# Patient Record
Sex: Male | Born: 1945
Health system: Southern US, Community
[De-identification: ages and names within clinical notes are randomized; demographics above are authoritative.]

## PROBLEM LIST (undated history)

## (undated) DIAGNOSIS — I10 Essential (primary) hypertension: Secondary | ICD-10-CM

## (undated) DIAGNOSIS — R7303 Prediabetes: Secondary | ICD-10-CM

## (undated) DIAGNOSIS — E785 Hyperlipidemia, unspecified: Secondary | ICD-10-CM

## (undated) DIAGNOSIS — M359 Systemic involvement of connective tissue, unspecified: Secondary | ICD-10-CM

## (undated) DIAGNOSIS — E119 Type 2 diabetes mellitus without complications: Secondary | ICD-10-CM

## (undated) DIAGNOSIS — I209 Angina pectoris, unspecified: Secondary | ICD-10-CM

## (undated) DIAGNOSIS — I219 Acute myocardial infarction, unspecified: Secondary | ICD-10-CM

## (undated) DIAGNOSIS — N189 Chronic kidney disease, unspecified: Secondary | ICD-10-CM

## (undated) HISTORY — PX: ANGIOPLASTY: SHX39

## (undated) HISTORY — DX: Prediabetes: R73.03

## (undated) HISTORY — PX: CARDIAC CATHETERIZATION: SHX172

---

## 2000-01-28 ENCOUNTER — Encounter: Payer: Self-pay | Admitting: *Deleted

## 2000-01-28 ENCOUNTER — Inpatient Hospital Stay (HOSPITAL_COMMUNITY): Admission: EM | Admit: 2000-01-28 | Discharge: 2000-02-01 | Payer: Self-pay | Admitting: *Deleted

## 2000-01-29 ENCOUNTER — Encounter: Payer: Self-pay | Admitting: Internal Medicine

## 2001-06-17 ENCOUNTER — Ambulatory Visit (HOSPITAL_COMMUNITY): Admission: RE | Admit: 2001-06-17 | Discharge: 2001-06-17 | Payer: Self-pay | Admitting: Family Medicine

## 2001-06-17 ENCOUNTER — Encounter: Payer: Self-pay | Admitting: Family Medicine

## 2004-03-09 ENCOUNTER — Emergency Department (HOSPITAL_COMMUNITY): Admission: EM | Admit: 2004-03-09 | Discharge: 2004-03-10 | Payer: Self-pay | Admitting: Emergency Medicine

## 2004-06-02 ENCOUNTER — Emergency Department (HOSPITAL_COMMUNITY): Admission: EM | Admit: 2004-06-02 | Discharge: 2004-06-02 | Payer: Self-pay | Admitting: Emergency Medicine

## 2004-08-14 ENCOUNTER — Ambulatory Visit: Payer: Self-pay | Admitting: Family Medicine

## 2004-08-21 ENCOUNTER — Ambulatory Visit: Payer: Self-pay | Admitting: Family Medicine

## 2004-09-24 ENCOUNTER — Ambulatory Visit: Payer: Self-pay | Admitting: *Deleted

## 2004-12-24 ENCOUNTER — Ambulatory Visit: Payer: Self-pay | Admitting: Family Medicine

## 2005-01-21 ENCOUNTER — Ambulatory Visit: Payer: Self-pay | Admitting: Family Medicine

## 2006-02-26 ENCOUNTER — Ambulatory Visit: Payer: Self-pay | Admitting: Family Medicine

## 2007-09-10 DIAGNOSIS — I219 Acute myocardial infarction, unspecified: Secondary | ICD-10-CM

## 2007-09-10 HISTORY — DX: Acute myocardial infarction, unspecified: I21.9

## 2007-11-14 ENCOUNTER — Inpatient Hospital Stay (HOSPITAL_COMMUNITY): Admission: EM | Admit: 2007-11-14 | Discharge: 2007-11-18 | Payer: Self-pay | Admitting: Emergency Medicine

## 2007-11-16 ENCOUNTER — Encounter (INDEPENDENT_AMBULATORY_CARE_PROVIDER_SITE_OTHER): Payer: Self-pay | Admitting: Cardiovascular Disease

## 2008-02-07 ENCOUNTER — Inpatient Hospital Stay (HOSPITAL_COMMUNITY): Admission: EM | Admit: 2008-02-07 | Discharge: 2008-02-10 | Payer: Self-pay | Admitting: Emergency Medicine

## 2008-02-09 ENCOUNTER — Encounter (INDEPENDENT_AMBULATORY_CARE_PROVIDER_SITE_OTHER): Payer: Self-pay | Admitting: Cardiology

## 2008-06-16 ENCOUNTER — Emergency Department (HOSPITAL_COMMUNITY): Admission: EM | Admit: 2008-06-16 | Discharge: 2008-06-16 | Payer: Self-pay | Admitting: Emergency Medicine

## 2008-09-07 ENCOUNTER — Ambulatory Visit: Payer: Self-pay | Admitting: Internal Medicine

## 2008-09-07 ENCOUNTER — Encounter (INDEPENDENT_AMBULATORY_CARE_PROVIDER_SITE_OTHER): Payer: Self-pay | Admitting: Family Medicine

## 2008-09-07 LAB — CONVERTED CEMR LAB
AST: 19 units/L (ref 0–37)
Albumin: 4.6 g/dL (ref 3.5–5.2)
Alkaline Phosphatase: 74 units/L (ref 39–117)
Basophils Relative: 0 % (ref 0–1)
Eosinophils Absolute: 0.1 10*3/uL (ref 0.0–0.7)
HDL: 43 mg/dL (ref >39)
LDL Cholesterol: 64 mg/dL (ref 0–99)
MCHC: 32.5 g/dL (ref 30.0–36.0)
MCV: 91.1 fL (ref 78.0–100.0)
Neutrophils Relative %: 69 % (ref 43–77)
Platelets: 167 10*3/uL (ref 150–400)
Potassium: 4.2 meq/L (ref 3.5–5.3)
RDW: 13.3 % (ref 11.5–15.5)
Sodium: 141 meq/L (ref 135–145)
Total Bilirubin: 0.8 mg/dL (ref 0.3–1.2)
Total Protein: 7.7 g/dL (ref 6.0–8.3)
VLDL: 46 mg/dL — ABNORMAL HIGH (ref 0–40)

## 2009-02-17 ENCOUNTER — Ambulatory Visit: Payer: Self-pay | Admitting: Internal Medicine

## 2009-02-22 ENCOUNTER — Ambulatory Visit: Payer: Self-pay | Admitting: Internal Medicine

## 2009-04-14 ENCOUNTER — Emergency Department (HOSPITAL_COMMUNITY): Admission: EM | Admit: 2009-04-14 | Discharge: 2009-04-14 | Payer: Self-pay | Admitting: Emergency Medicine

## 2009-04-14 ENCOUNTER — Telehealth (INDEPENDENT_AMBULATORY_CARE_PROVIDER_SITE_OTHER): Payer: Self-pay | Admitting: *Deleted

## 2009-05-16 ENCOUNTER — Ambulatory Visit: Payer: Self-pay | Admitting: Internal Medicine

## 2009-08-11 ENCOUNTER — Ambulatory Visit: Payer: Self-pay | Admitting: Family Medicine

## 2009-08-11 ENCOUNTER — Encounter (INDEPENDENT_AMBULATORY_CARE_PROVIDER_SITE_OTHER): Payer: Self-pay | Admitting: Adult Health

## 2009-08-11 LAB — CONVERTED CEMR LAB
ALT: 28 units/L (ref 0–53)
AST: 19 units/L (ref 0–37)
Alkaline Phosphatase: 79 units/L (ref 39–117)
Calcium: 9.7 mg/dL (ref 8.4–10.5)
Chloride: 106 meq/L (ref 96–112)
Creatinine, Ser: 1.13 mg/dL (ref 0.40–1.50)
HDL: 39 mg/dL — ABNORMAL LOW (ref >39)
LDL Cholesterol: 78 mg/dL (ref 0–99)
Total CHOL/HDL Ratio: 3.9
VLDL: 37 mg/dL (ref 0–40)
Vit D, 25-Hydroxy: 27 ng/mL — ABNORMAL LOW (ref 30–89)

## 2009-08-16 ENCOUNTER — Ambulatory Visit (HOSPITAL_COMMUNITY): Admission: RE | Admit: 2009-08-16 | Discharge: 2009-08-16 | Payer: Self-pay | Admitting: Internal Medicine

## 2009-08-16 ENCOUNTER — Ambulatory Visit: Payer: Self-pay | Admitting: Vascular Surgery

## 2009-08-16 ENCOUNTER — Encounter: Payer: Self-pay | Admitting: Internal Medicine

## 2009-08-31 ENCOUNTER — Ambulatory Visit: Payer: Self-pay | Admitting: Internal Medicine

## 2009-08-31 LAB — CONVERTED CEMR LAB
Cholesterol: 135 mg/dL (ref 0–200)
Triglycerides: 111 mg/dL (ref <150)
VLDL: 22 mg/dL (ref 0–40)

## 2009-09-11 ENCOUNTER — Ambulatory Visit: Payer: Self-pay | Admitting: Internal Medicine

## 2009-12-08 ENCOUNTER — Ambulatory Visit: Payer: Self-pay | Admitting: Adult Health

## 2009-12-08 ENCOUNTER — Encounter (INDEPENDENT_AMBULATORY_CARE_PROVIDER_SITE_OTHER): Payer: Self-pay | Admitting: Family Medicine

## 2009-12-08 LAB — CONVERTED CEMR LAB
BUN: 14 mg/dL (ref 6–23)
CO2: 26 meq/L (ref 19–32)
Chloride: 106 meq/L (ref 96–112)
Creatinine, Ser: 1.12 mg/dL (ref 0.40–1.50)
Glucose, Bld: 120 mg/dL — ABNORMAL HIGH (ref 70–99)
Potassium: 4.2 meq/L (ref 3.5–5.3)

## 2009-12-12 ENCOUNTER — Ambulatory Visit (HOSPITAL_COMMUNITY): Admission: RE | Admit: 2009-12-12 | Discharge: 2009-12-12 | Payer: Self-pay | Admitting: Family Medicine

## 2010-09-30 ENCOUNTER — Encounter: Payer: Self-pay | Admitting: Family Medicine

## 2011-01-22 NOTE — Discharge Summary (Signed)
NAMECATARINO, VOLD             ACCOUNT NO.:  [id number]   MEDICAL RECORD NO.:  [id number]          PATIENT TYPE:  INP   LOCATION:  2041                         FACILITY:  [facility]   PHYSICIAN:  Darlin Priestly, MD  DATE OF BIRTH:  06-28-1946   DATE OF ADMISSION:  11/14/2007  DATE OF DISCHARGE:  11/18/2007                               DISCHARGE SUMMARY   DISCHARGE DIAGNOSES:  1. 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  2. Preserved left ventricular function probably secondary to right      ventricle infarct.   HOSPITAL COURSE:  Mr. Matuszak is a 65 year old male who was admitted  November 14, 2007 with chest pain consistent with unstable angina.  EKG  showed acute inferior MI on admission.  He was taken urgently to the  cath lab by Dr. Jenne Campus.  Catheterization revealed an occluded RCA with  thrombus.  He had moderate disease after the occlusion of 50%.  The  patient underwent intervention to the RCA with a driver stent with good  final results.  Postoperatively he was moderately hypotensive.  We were  unable to start ACE inhibitor therapy because of his hypotension.  He  was started on low dose beta blocker.  Echocardiogram was done November 16, 2007, and the results of this are pending at the time of dictation.  The  patient was transferred to telemetry and ambulated.  He was up with  without problems with physical therapy, although his pressure is still  in the 90's systolic.  We feel he can be discharged November 18, 2007.  He  will need to have his echocardiogram followed up as well as blood  culture and urine culture which were obtained prior to discharge for a  low grade temperature.  He was put on amoxicillin for five days prior to  discharge by Dr. Jenne Campus.   DISCHARGE MEDICATIONS:  1. Aspirin 81 mg two tablets daily.  2. Simvastatin 80 mg a day.  3. Plavix 75 mg a day.  4. Metoprolol 25 mg 1/2 tablet twice a day.  5. Nitroglycerin sublingual p.r.n.  6. Amoxicillin 500 mg b.i.d. for four days.   LABORATORY DATA:  White count 9.9, hemoglobin 13, hematocrit 37.9,  platelets 147.  Sodium 138, potassium 3.6, BUN 10, creatinine 1.  CK's  peaked at 1567 with 160 MB's.  Cholesterol was 212 with an LDL 146, HDL  47.  Urine culture and blood cultures were pending at the time of this  dictation.   EKG shows sinus rhythm with inferior Q waves.  Chest x-ray shows low  lung volumes with bibasilar atelectasis.  Urinalysis was negative on  November 16, 2007.  INR 0.9.   DISPOSITION:  The patient is discharged in stable condition.  Will  follow up with Dr. Jenne Campus.  He will need a follow-up with his  echocardiogram and his urine culture and blood cultures.  He is afebrile  at discharge.      Abelino Derrick, P.A.      Darlin Priestly, MD  Electronically  Signed    LKK/MEDQ  D:  11/18/2007  T:  11/19/2007  Job:  [id number]

## 2011-01-22 NOTE — Cardiovascular Report (Signed)
NAMEABDULRAHMAN, Samuel Lawson             ACCOUNT NO.:  [id number]   MEDICAL RECORD NO.:  [id number]          PATIENT TYPE:  INP   LOCATION:  1824                         FACILITY:  [facility]   PHYSICIAN:  Darlin Priestly, MD  DATE OF BIRTH:  [birth year]   DATE OF PROCEDURE:  11/14/2007  DATE OF DISCHARGE:                            CARDIAC CATHETERIZATION   PROCEDURE:  1. Left heart catheterization.  2. Coronary angiography.  3. Left ventriculography.  4. RCA - proximal - percutaneous transluminal balloon angioplasty -      placement of intracoronary stent.   ATTENDING PHYSICIAN:  Darlin Priestly, MD   COMPLICATIONS:  None.   INDICATIONS:  Samuel Lawson is a 65 year old male with a questionable past  medical history of prior MI with percutaneous intervention, history of  ongoing tobacco use, history of noncompliance, has not seen a medical  physician in years.  The patient was taking only an aspirin at home.  He  had some stuttering chest pain on the evening of November 13, 2007 but  sought no medical attention.  Approximately 4 o'clock on November 14, 2007,  he developed acute onset of substernal chest pain with associated  diaphoresis.  He subsequently contacted EMS and he was found to have 3-4  mm ST-segment elevation in the inferior lateral leads.  Upon arrival to  the ER, he was writhing in pain, unable to obtain a clear history, other  than the fact he had chest pain for an hour.  He is unable to identify  any primary care physician or cardiologist.  He is now brought  emergently to cardiac cath lab for primary intervention.   DESCRIPTION OF OPERATION:  After informed consent, the patient was  brought to the cardiac cath lab.  Right groin shaved, prepped and draped  in a usual sterile fashion.  Anesthesia was established.  Using a  modified Seldinger technique, a #6-French arterial sheath was inserted  in the right femoral artery.  A #6-French JL-4 diagnostic catheter was  used to  perform the left diagnostic angiography.   Left main is large vessel, no evidence of  disease.   The LAD is a medium-size vessel which courses back to two diagonal  branches.  LAD has mild 30% narrowing after the takeoff of the second  diagonal and becomes a small vessel toward the apex.   First diagonal is small vessel with no significant disease.   The second diagonal is a medium-size vessel which runs toward the apex  as a twin LAD.  There is no significant disease in the diagonal.   The left circumflex is a large vessel, coursing the AV groove, and gives  rise to two obtuse marginal branched.  The AV groove circumflex has no  significant disease.   The first OM is a medium to large vessel which bifurcates in the segment  which is coarse to irregular and has no high-grade stenosis.   The second OM has no significant disease.   The right coronary artery is a large vessel which is totally occluded at  its proximal portion with a visible thrombus.  Left ventriculogram reveals preserved EF of 60% with mild inferoapical  hypokinesis.   HEMODYNAMIC RESULTS:  Systemic arterial pressure 118/85, LV systemic  pressure 118/80, LVEDP of 21.   INTERVENTION AND PROCEDURE:  Following diagnostic angiography, a number  6-French JR-4 guiding catheter with side holes engaged the right  coronary ostium.  Next, a 0.014 Prowater guidewire was advanced in the  guiding catheter and used to cross the proximal chronic total occlusion.  We were able to establish flow once we were crossed with the wire.  The  wire was then positioned to the distal PDA without difficulty.  Following this,  a Fire Star 2.75 x 15-mm balloon was then positioned  across the stenotic lesion.  One inflation to 8 atmospheres was  performed for a total of 16 seconds.  We were able to reestablish TIMI  III flow in the vessel.  This balloon was removed and a driver 3.0 x 18-  mm stent was then positioned across the stenotic  lesion.  Two inflations  to a maximum of 16 atmospheres was performed for total of 35 seconds.  Follow-up angiogram revealed good luminal gain with TIMI III flow to the  distal vessel.  This balloon was removed and a Dura Star 3.5 x 15-mm  balloon was then positioned in the distal portion of the stent and  multiple balloon inflations were then performed throughout the distal  mid and proximal portion, up to 16 atmospheres for a total of  approximately 1 minute.  Follow-up angiogram revealed good luminal gain  with no evidence of dissection or thrombus and TIMI III flow to the  distal vessel.  It should be noted there was a stent noted in the distal  RCA with 40% in-stent restenosis.  There was 50% mid RCA stenosis as  well as a 40% sequential disease.  The PDA was irregular but had no high-  grade stenosis.   INTERVENTIONS:  A dose of heparin given to maintain the ACT between 200-  300.  Intravenous Integrilin was used throughout the case.   Final orthogonal angiograms revealed less than 10 residual stenosis and  proximal RCA stenotic occlusion with TIMI III flow to the distal vessel.  At this point, we elected to conclude the procedure.  All balloons,  wires and catheters were removed.  Hemostatic sheath was sewn in place.  The patient was transferred back to the ward in stable condition.   CONCLUSIONS:  1. Successful percutaneous transluminal coronary balloon angioplasty      and placement of a driver 3.0 x 18 mm stent, ultimately      postdilated to 3.6 mm in the proximal RCA stenotic lesion.  2. Normal left ventricular systolic function and wall motion      abnormalities noted above.  3. Elevated LVEDP.  4. Adjuvant use of Integrilin infusion.      Darlin Priestly, MD  Electronically Signed     RHM/MEDQ  D:  11/14/2007  T:  11/16/2007  Job:  [id number]

## 2011-01-22 NOTE — Discharge Summary (Signed)
Samuel Lawson, Samuel Lawson             ACCOUNT NO.:  [id number]   MEDICAL RECORD NO.:  [id number]          PATIENT TYPE:  INP   LOCATION:  2017                         FACILITY:  [facility]   PHYSICIAN:  Raymon Mutton, P.A. DATE OF BIRTH:  1946-01-16   DATE OF ADMISSION:  02/07/2008  DATE OF DISCHARGE:  02/10/2008                               DISCHARGE SUMMARY   DISCHARGE DIAGNOSES:  1. Status post acute ST elevation myocardial infarction.  2. Hypertension.  3. Hyperlipidemia.  4. Medical noncompliance.  5. 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  6. Ongoing tobacco use.   HISTORY OF PRESENT ILLNESS:  Samuel Lawson is a 65 year old [place]  gentleman with previous history of coronary artery disease, underwent  stenting of the RCA on November 14, 2007.  At the time of discharge, the  patient was put on Plavix and aspirin but stopped taking Plavix  approximately 2 months ago and was taking only 2 baby aspirin.  He  developed onset of chest pain around 5:45 p.m. on the day of his  presentation.  Pain was severe.  The patient became diaphoretic, short  of breath, and called EMS.  On the way to [facility], he was  given sublingual nitroglycerin.  His EKG showed ST elevation in the  inferior leads and Code STEMI was activated.  The patient underwent  coronary angiography emergently at the same day and it revealed proximal  lesion of the RCA.  Dr. Elsie Lincoln performed angioplasty and stenting of  that segment with the Vision stent.  The lesion was thrombotic and  produced embolic shower after the first dye injection.  The patient  tolerated the procedure well.  TIMI-3 flow was restored and the lesion  was reduced from 95% to 0%.   The patient was in the CCU for the first 48 hours and then transferred  to a regular telemetry floor.  He was started on Chantix for smoking  cessation.   LABORATORIES:  His hemoglobin A1c was within normal limits at 5.9.  His  cardiac enzymes  on presentation revealed normal CK 101, CK-MB 1.7, and  troponin 0.01.  Second set of cardiac panel post cath revealed CK 682,  CK-MB 106.2, troponin 13.26, relative index 15.6.  Second set showed CK  835, CK-MB 136.4, troponin 21.51.  Third set revealed CK 617, CK-MB  98.2, and troponin 12.75, relative index 14.6.  BMET showed sodium 138,  potassium 3.6, chloride 102, CO2 25, glucose 108, BUN 11, creatinine  1.03.  Lipid profile showed total cholesterol of 138, triglycerides 121,  cholesterol HDL 35, and LDL 79.  CBC showed white blood cell count 8.1,  hemoglobin 14.1, hematocrit 68.6, platelet count 135.  Magnesium 2.0.   DISCHARGE MEDICATIONS:  1. Plavix 75 mg daily.  2. Aspirin 325 mg daily.  3. Zocor 80 mg daily.  4. Lopressor 12.5 mg b.i.d.  5. Chantix 0.5 mg today and tomorrow once a day, 0.5 mg b.i.d.      starting from February 12, 2008 through February 15, 2008, and then Chantix 1      mg b.i.d.  6.  Pepcid 20 mg daily.   The patient is to stay on low-fat, low-cholesterol diet.  He can  increase activity slowly.  No driving and no weight lifting for 3 days  post cath.  No sexual activity for 2 weeks.   DISCHARGE FOLLOWUP:  Dr. Elsie Lincoln will see the patient in our office on  February 29, 2008, at 12:15.      Raymon Mutton, P.A.     MK/MEDQ  D:  02/10/2008  T:  02/11/2008  Job:  [id number]

## 2011-01-25 NOTE — Discharge Summary (Signed)
[place]  Patient:    Samuel Lawson, Samuel Lawson                      MRN: [id number] Adm. Date:  [id number] Disc. Date: [id number] Attending:  Physician, Ed Dictator:   Gwenlyn Found, M.D. CC:         Cecil Cranker, M.D. [facility]             Peter C. Eden Emms, M.D. [facility]             Daisey Must, M.D. [facility]             [facility]                           Discharge Summary  CONSULTANTS: Dr. Theron Arista C. Nishan. Dr. Cecil Cranker. Dr. Loraine Leriche Pulsipher.  PROCEDURES:  Left heart catheterization performed by Dr. Loraine Leriche Pulsipher and Dr.  Corinda Gubler; intravascular ultrasound of the left circumflex as part of the reversal study.  These procedures were performed on 01/31/00.  DISCHARGE DIAGNOSES: 1. Coronary artery disease status post percutaneous transluminal coronary    angioplasty with stenting of the distal right coronary artery, taking a 90%    stenosis to 0% on 01/31/00. 2. Hyperlipidemia. 3. Tobacco abuse.  DISCHARGE MEDICATIONS: 1. Enteric-coated aspirin 325 mg p.o. q.d. 2. Plavix 75 mg p.o. q.d. 3. Toprol XL 25 mg p.o. q.d. 4. Sublingual nitroglycerin 0.4 mg sublingually q.79minutes times three max, p.r.n.    for chest pain. 5. Darvocet-N 100 one p.o. q.4h. p.r.n. 6. Zyban 150 mg p.o. q.d. x 3 days; then b.i.d. x 7 weeks.  CHIEF COMPLAINT:  Chest pain.  HISTORY OF PRESENT ILLNESS:  Samuel Lawson is a 65 year old [place] male with a history of tobacco abuse; he presents complaining of chest pain that started while ambulating on the morning of admission at 9:30; the pain was substernal initially and nonradiating.  The pain, described as a pressure, increased throughout the morning, becoming a 10 out of 10.  He also developed associated nausea, diaphoresis and left upper extremity heaviness.  The patient was on [employer], so he stopped at [employer].  He reports the pain decreased to 8 out of 10 after sublingual nitroglycerin.  He  reports that the pain at the time of admission was a 10 out of 10.  He had not had similar pain in the past.  He does report that he did not eat on the morning of admission or last night and has had hunger pains.  PAST MEDICAL HISTORY: 1. Tobacco abuse. 2. Rectal fissure. 3. History of heartburn.  PREVIOUS SURGICAL HISTORY:  None.  ALLERGIES:  No known drug allergies.  MEDICATIONS:  None.  SOCIAL HISTORY:  The patient lives in [place]; he is divorced and has two children.  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  Tobacco:  One pack per day x 20 years.  Alcohol:  None.  Drugs:  None.  REVIEW OF SYSTEMS:  Positive for chest pain, shortness of breath, nausea, diaphoresis, constipation and headache.  Negative for emesis, diarrhea, fevers r chills.  FAMILY HISTORY:  Mom still living in her 83s; still healthy.  Dad died in his 65s and had Alzheimers disease, a stroke and hypertension.  Three sisters, who are healthy.  One brother, who is healthy.  There is no family history of diabetes,  coronary artery  disease or hyperlipidemia.  PHYSICAL EXAMINATION:  GENERAL:  Well-nourished, well-developed male in no acute distress; appears stated age.  VITAL SIGNS:  Temperature 97.9, blood pressure 101/62, pulse 53, respiratory rate 14 and O2 saturation 99% on two liters.  SKIN:  Warm and dry.  HEENT:  Normocephalic, atraumatic.  PERRLA; sclerae are anicteric, EOMI. Oropharynx with no erythema or exudates.  Nares are patent bilaterally.  NECK:  Supple, with no lymphadenopathy, adenopathy or JVD.  LUNGS:  Clear to auscultation bilaterally.  HEART:  Bradycardic but regular; normal S1 and S2.  No murmurs, rubs or gallops.  ABDOMEN:  Soft, mildly tender in the epigastric region; no hepatosplenomegaly or rebound or guarding.  Slightly increased bowel sounds.  EXTREMITIES:  No edema; 2+ dorsalis pedis and posterior tibialis pulses.  NEUROLOGIC:   Grossly intact and nonfocal cranial nerves II-XII.  Deep tendon reflexes are 1+ diffusely.  LABORATORY:  WBC 9.6, hemoglobin 16.6, platelets 184,000; PT 13.6, INR 1.1, PTT 30.  Sodium ____, potassium 4.1, chloride 105, CO2 was 28, BUN 13, creatinine 1.2, glucose 115, calcium 9.0, Total protein 7.0, albumin 3.9, Total bilirubin 1.2, GOT 19, SGPT 17, alk phos 60, lipase 22.  Troponin-I less than 0.03.  Portable chest x-ray showed no acute disease.  EKG showed sinus bradycardia with ventricular rate of 53 and no ST-T changes.  ASSESSMENT:  Samuel Lawson is a 65 year old male who is overweight and uses tobacco products.  He does not have a history of diabetes mellitus, hypertension; his lipid status is unknown.  With the exception of the reproducibility, the chest pain symptoms are typical of myocardial ischemia but he has an abnormal EKG and his Troponin-I is less than 0.03.  Because of his ______ it was felt necessary to admit the patient to the medicine teaching service and monitor him on telemetry and rule him out for myocardial infarction.  Also, because of his prior history, it was felt necessary to treat him empirically for chest pain with the etiology of a gastrointestinal cocktail and  Protonix empirically.  HOSPITAL COURSE:  By problem: 1. Coronary artery disease. The patient was admitted to the medicine teaching service under the direction of Dr. Marin Roberts.  Initially, the patient was started on a daily aspirin as well as continued on sublingual nitroglycerin.  Initially, he had been started on heparin and nitroglycerin; these were both stopped at the time of admission.  Because of the pain story, it was felt necessary to reinstitute anticoagulation  therapy and on 01/29/00, the patient was started on Lovenox subcu q.12h. in anticipation of further cardiac work-up.  A cardiology consultation was obtained  from Dr. Charlton Haws, who agreed with the  plan to take the patient to left heart catheterization.  On 01/31/00, the patient underwent left heart catheterization by Dr. Loraine Leriche Pulsipher and Dr. Graceann Congress - showing the left main coronary artery was normal, left  anterior descending coronary artery was also normal, left circumflex coronary was normal except for some minor irregularity proximally.  The right coronary artery had a focal area of ruptured plaque with haziness and 90% stenosis.  The left ventricle was normal and the abdominal aortogram was normal.  The 90% RCA lesion was PTCAd and stented with achievement of 0% residual stenosis.  The patient tolerated the procedure well and postprocedure was transferred to 6500 prior to discharge.  Of note:  Throughout the patients hospitalization his Troponin-I level initially continued to increase with the initial value of less  than 0.03 and  a second value of 0.08 and a third value of 0.10 and then they began to turn down to 0.08.  CK/CKMBs were 56 and 3.9, 164 and 4.7, 152 and 3.2, with  finally 136 and 5.8.  Fasting lipid profile: Total cholesterol 218, triglyceride level 145, HDL 39 and LDL 150.  In this regard, it was initially decided to start the patient on Lipitor but the patient was subsequently enrolled in the reversal study and thus, the patient was not discharged on this medication as it was planned for this to be instituted in follow-up.  2. Hyperlipidemia, as per #1.  3. Tobacco abuse. The patient voiced understanding of the risks and benefits of smoking cessation or continuing smoking and the patient expressed interest in trying Zyban.  Thus, at the time of discharge, he was instructed on the use of Zyban and was discharged  with a prescription for this medication, to be taken over approximately 7-1/2 weeks time.  DISCHARGE INSTRUCTIONS:  The patient was instructed not to drive, perform strenuous or sexual activity for two days.  He may  return to work on Tuesday, 02/05/00. he patient was told to call [facility] for bleeding, swelling or drainage t the catheter site.  He was also instructed not to smoke cigarettes.  Follow-up:  The patient is to follow-up with Dr. Rhea Pink in the [facility] on Monday, 02/11/00, at 10 a.m.  The patient is also to follow-up with Dr. Charlton Haws on 02/26/00, at 12:15 p.m. DD:  03/02/00 TD:  03/02/00 Job: [id number]

## 2011-01-25 NOTE — Cardiovascular Report (Signed)
[place]  Patient:    Samuel Lawson, Samuel Lawson                      MRN: [id number] Proc. Date: 01/31/00 Adm. Date:  [id number] Disc. Date: [id number] Attending:  Physician, Ed CC:         Cardiac Catheterization Laboratory             Daisey Must, M.D. [facility]                        Cardiac Catheterization  INDICATIONS:  The patient is a 65 year old [place] male smoker with recent chest pain and minimal rise of enzymes.  The LVEDP is 14.  There is no gradient on pullback from left ventricle aorta.  ANGIOGRAPHY: 1. Left main:  The left main coronary artery is normal. 2. Left anterior descending:  The left anterior descending coronary artery    is normal. 3. Circumflex:  The circumflex coronary is normal except for some minor    irregularity proximally. 4. Right coronary artery:  The right coronary artery has a focal area of    ruptured plaque with haziness and 90% stenosis. 5. Left ventricle:  The left ventricle is normal. 6. Abdominal aortogram:  The abdominal aortogram is normal.  SUMMARY:  Recent nontransmural myocardial infarction with single-vessel disease with focal lesion in the distal right coronary artery related to ruptured plaque.  Cine reviewed with Dr. Gerri Spore and percutaneous intervention of the RCA is planned. DD:  01/31/00 TD:  02/03/00 Job: [id number]

## 2011-01-25 NOTE — Cardiovascular Report (Signed)
[place]  Patient:    Samuel Lawson, Samuel Lawson                      MRN: [id number] Proc. Date: 01/31/00 Adm. Date:  [id number] Disc. Date: [id number] Attending:  Physician, Ed CC:         [facility]             Noralyn Pick. Eden Emms, M.D. [facility]             Cardiac Catheterization Laboratory                        Cardiac Catheterization  PROCEDURES PERFORMED: 1. Percutaneous transluminal coronary angioplasty with stent placement    in the distal right coronary artery. 2. Intravascular ultrasound of the left circumflex coronary artery as part    of the REVERSAL protocol.  INDICATIONS:  Samuel Lawson is a 65 year old male who presented with chest pain and had positive cardiac enzymes consistent with a small non-Q-wave myocardial infarction.  Cardiac catheterization by Dr. Corinda Gubler reveals the presence of a 90% stenosis with thrombus in the distal right coronary artery.  We opted to proceed with percutaneous intervention.  The patient was also enrolled in the REVERSAL protocol.  PERCUTANEOUS TRANSLUMINAL CORONARY ANGIOPLASTY PROCEDURE:  A preexisting 6 French sheath in the right femoral artery was exchanged over a wire for a 7 [place] sheath.  We used a 7 [place] guide catheter with side holes and a BMW wire.  Predilatation lesion was performed with a 3.25 x 12 mm Quantum Ranger inflated to 12 atmospheres.  We then deployed a 3.0 x 12 mm NIR Elite stent at 16 atmospheres.  Angiographic images revealed an excellent result with 0% residual stenosis and TIMI-3 flow.  The patient did have some chest pain after balloon inflations and stent deployment which was resolved with intracoronary nitroglycerin and verapamil.  We then turned our attention to the left circumflex.  We used a 7 [place] Voda left 3.5 guiding catheter and a BMW wire.  A 3.2 Ultra-Cross intravascular ultrasound catheter was utilized and intravascular images of the left circumflex were  obtained and recorded.  This revealed the presence of mild eccentric to concentric plaque in the mid left circumflex which was clearly nonobstructed.  Of note, the patient had significant chest pain during the intravascular ultrasound.  This was removed.  After removal of the ultrasound catheter and administration of intracoronary and intravenous nitroglycerin.  COMPLICATIONS:  None.  RESULTS:  Successful PTCA with stent placement in the distal right coronary artery.  A 90% stenosis with thrombus was reduced to 0% residual with TIMI-3 flow.  PLAN:  ReoPro will be continued for 12 hours.  Plavix will be administered for four weeks. DD:  01/31/00 TD:  02/03/00 Job: [id number]

## 2011-06-03 LAB — BASIC METABOLIC PANEL
BUN: 10
BUN: 9
BUN: 9
CO2: 26
Calcium: 9.1
Chloride: 105
Chloride: 107
Chloride: 109
Creatinine, Ser: 1.01
Creatinine, Ser: 1.09
Creatinine, Ser: 1.24
GFR calc Af Amer: >60
GFR calc Af Amer: >60
GFR calc non Af Amer: >60
Glucose, Bld: 126 — ABNORMAL HIGH
Potassium: 3.4 — ABNORMAL LOW
Potassium: 3.7
Potassium: 3.8
Sodium: 139

## 2011-06-03 LAB — CBC
HCT: 36.4 — ABNORMAL LOW
HCT: 45.7
Hemoglobin: 15.5
MCHC: 34.1
MCHC: 34.8
MCV: 87.8
MCV: 88.8
MCV: 89.4
Platelets: 130 — ABNORMAL LOW
Platelets: 147 — ABNORMAL LOW
Platelets: 149 — ABNORMAL LOW
Platelets: 172
RBC: 4.27
RDW: 12.8
RDW: 12.9
RDW: 13.1
WBC: 15.1 — ABNORMAL HIGH
WBC: 17.4 — ABNORMAL HIGH
WBC: 9.9

## 2011-06-03 LAB — COMPREHENSIVE METABOLIC PANEL
BUN: 9
CO2: 16 — ABNORMAL LOW
Calcium: 8.9
Chloride: 109
Creatinine, Ser: 1.25
GFR calc non Af Amer: 59 — ABNORMAL LOW
Total Bilirubin: 0.9

## 2011-06-03 LAB — CARDIAC PANEL(CRET KIN+CKTOT+MB+TROPI)
CK, MB: 160.2 — ABNORMAL HIGH
Relative Index: 10.2 — ABNORMAL HIGH
Relative Index: 3.7 — ABNORMAL HIGH
Relative Index: 6 — ABNORMAL HIGH
Relative Index: 9.6 — ABNORMAL HIGH
Total CK: 1567 — ABNORMAL HIGH
Total CK: 415 — ABNORMAL HIGH
Total CK: 966 — ABNORMAL HIGH
Troponin I: 13.18
Troponin I: 30.23
Troponin I: 61.72

## 2011-06-03 LAB — I-STAT 8, (EC8 V) (CONVERTED LAB)
BUN: 9
Bicarbonate: 15.4 — ABNORMAL LOW
Glucose, Bld: 130 — ABNORMAL HIGH
Hemoglobin: 16.3
TCO2: 16
pCO2, Ven: 18.1 — ABNORMAL LOW
pH, Ven: 7.538 — ABNORMAL HIGH

## 2011-06-03 LAB — URINE CULTURE
Colony Count: NO GROWTH
Special Requests: NEGATIVE

## 2011-06-03 LAB — URINALYSIS, ROUTINE W REFLEX MICROSCOPIC
Bilirubin Urine: NEGATIVE
Glucose, UA: NEGATIVE
Ketones, ur: NEGATIVE
Protein, ur: NEGATIVE
pH: 6

## 2011-06-03 LAB — DIFFERENTIAL
Basophils Absolute: 0.1
Eosinophils Absolute: 0.1
Eosinophils Relative: 0
Lymphocytes Relative: 18
Lymphs Abs: 3.2
Neutrophils Relative %: 75

## 2011-06-03 LAB — CULTURE, BLOOD (ROUTINE X 2)
Culture: NO GROWTH
Culture: NO GROWTH

## 2011-06-03 LAB — LIPID PANEL
Cholesterol: 212 — ABNORMAL HIGH
HDL: 37 — ABNORMAL LOW
LDL Cholesterol: 146 — ABNORMAL HIGH
Triglycerides: 143

## 2011-06-03 LAB — POCT I-STAT CREATININE
Creatinine, Ser: 1.4
Operator id: 151321

## 2011-06-03 LAB — CK TOTAL AND CKMB (NOT AT ARMC)
CK, MB: 4.4 — ABNORMAL HIGH
Relative Index: 1.6

## 2011-06-03 LAB — POCT CARDIAC MARKERS: Troponin i, poc: <0.05

## 2011-06-03 LAB — PROTIME-INR
INR: 0.9
Prothrombin Time: 12.7

## 2011-06-05 LAB — POCT I-STAT, CHEM 8
BUN: 14
Calcium, Ion: 1.05 — ABNORMAL LOW
Chloride: 110
Creatinine, Ser: 1.2
Glucose, Bld: 181 — ABNORMAL HIGH
HCT: 42
Hemoglobin: 14.3
Potassium: 3.4 — ABNORMAL LOW
Sodium: 139
TCO2: 17

## 2011-06-05 LAB — COMPREHENSIVE METABOLIC PANEL
ALT: 19
AST: 67 — ABNORMAL HIGH
Albumin: 3.6
Alkaline Phosphatase: 62
GFR calc Af Amer: >60
Potassium: 4.2
Sodium: 139
Total Protein: 6.3

## 2011-06-05 LAB — POCT CARDIAC MARKERS
CKMB, poc: <1 — ABNORMAL LOW
Myoglobin, poc: 54.3
Operator id: 151321
Troponin i, poc: <0.05

## 2011-06-05 LAB — DIFFERENTIAL
Basophils Relative: 0
Eosinophils Absolute: 0
Eosinophils Absolute: 0.1
Lymphs Abs: 3.2
Monocytes Absolute: 0.8
Monocytes Relative: 5
Neutro Abs: 14.9 — ABNORMAL HIGH
Neutro Abs: 8.9 — ABNORMAL HIGH
Neutrophils Relative %: 67

## 2011-06-05 LAB — CBC
HCT: 41.9
Hemoglobin: 14.2
Platelets: 137 — ABNORMAL LOW
Platelets: 196
RBC: 4.68
RDW: 13.5
WBC: 13.4 — ABNORMAL HIGH

## 2011-06-05 LAB — MAGNESIUM: Magnesium: 2

## 2011-06-05 LAB — PROTIME-INR
INR: 1
Prothrombin Time: 12.9

## 2011-06-05 LAB — TROPONIN I: Troponin I: 0.01

## 2011-06-05 LAB — CARDIAC PANEL(CRET KIN+CKTOT+MB+TROPI)
CK, MB: 106.2 — ABNORMAL HIGH
Relative Index: 15.6 — ABNORMAL HIGH
Troponin I: 13.26

## 2011-06-05 LAB — CK TOTAL AND CKMB (NOT AT ARMC): Relative Index: 1.7

## 2011-06-06 LAB — LIPID PANEL
Cholesterol: 138
HDL: 31 — ABNORMAL LOW
HDL: 35 — ABNORMAL LOW
LDL Cholesterol: 79
Total CHOL/HDL Ratio: 4.2
Triglycerides: 121

## 2011-06-06 LAB — BASIC METABOLIC PANEL
CO2: 23
CO2: 28
Calcium: 8.9
Calcium: 9
Chloride: 108
Creatinine, Ser: 1.02
Creatinine, Ser: 1.03
GFR calc Af Amer: >60
GFR calc Af Amer: >60
GFR calc non Af Amer: >60
GFR calc non Af Amer: >60
Glucose, Bld: 111 — ABNORMAL HIGH
Sodium: 138

## 2011-06-06 LAB — CARDIAC PANEL(CRET KIN+CKTOT+MB+TROPI)
CK, MB: 90.2 — ABNORMAL HIGH
Total CK: 617 — ABNORMAL HIGH
Total CK: 835 — ABNORMAL HIGH
Troponin I: 12.75

## 2011-06-06 LAB — CBC
HCT: 38.1 — ABNORMAL LOW
Hemoglobin: 14.1
MCHC: 34.3
MCHC: 34.3
MCV: 89.5
Platelets: 120 — ABNORMAL LOW
Platelets: 135 — ABNORMAL LOW
RBC: 4.53
RDW: 13.3
RDW: 13.4
WBC: 12.4 — ABNORMAL HIGH
WBC: 8.1

## 2011-06-06 LAB — HEMOGLOBIN A1C: Mean Plasma Glucose: 133

## 2011-06-10 LAB — POCT I-STAT, CHEM 8
BUN: 9
Calcium, Ion: 1.17
Calcium, Ion: 1.2
Creatinine, Ser: 1.2
Glucose, Bld: 86
Glucose, Bld: 89
HCT: 47
Hemoglobin: 16
Potassium: 4.4
TCO2: 26

## 2011-06-10 LAB — CBC
MCHC: 33.5
MCV: 90.5
RBC: 5.04

## 2011-06-10 LAB — DIFFERENTIAL
Basophils Relative: 0
Eosinophils Absolute: 0.1
Monocytes Relative: 9
Neutrophils Relative %: 77

## 2011-09-17 ENCOUNTER — Ambulatory Visit (HOSPITAL_COMMUNITY)
Admission: RE | Admit: 2011-09-17 | Discharge: 2011-09-17 | Disposition: A | Discharge status: Home / Self Care | Payer: Self-pay | Source: Ambulatory Visit | Attending: Family Medicine | Admitting: Family Medicine

## 2011-09-17 DIAGNOSIS — R42 Dizziness and giddiness: Secondary | ICD-10-CM

## 2011-09-17 NOTE — Progress Notes (Signed)
*  PRELIMINARY RESULTS* Right - No significant ICA stenosis.  Left -  40-59% internal carotid artery stenosis.    Vertebral arteries are patent with antegrade flow.  Mila Homer 09/17/2011, 10:25 AM

## 2012-03-02 ENCOUNTER — Other Ambulatory Visit (HOSPITAL_COMMUNITY): Payer: Self-pay | Admitting: Family Medicine

## 2012-03-02 DIAGNOSIS — I6529 Occlusion and stenosis of unspecified carotid artery: Secondary | ICD-10-CM

## 2012-03-02 DIAGNOSIS — G459 Transient cerebral ischemic attack, unspecified: Secondary | ICD-10-CM

## 2012-03-05 ENCOUNTER — Other Ambulatory Visit (HOSPITAL_COMMUNITY): Payer: Self-pay

## 2012-03-09 ENCOUNTER — Ambulatory Visit (HOSPITAL_COMMUNITY)
Admission: RE | Admit: 2012-03-09 | Discharge: 2012-03-09 | Disposition: A | Discharge status: Home / Self Care | Payer: Self-pay | Source: Ambulatory Visit | Attending: Family Medicine | Admitting: Family Medicine

## 2012-03-09 ENCOUNTER — Ambulatory Visit (HOSPITAL_COMMUNITY): Admission: RE | Admit: 2012-03-09 | Payer: Self-pay | Source: Ambulatory Visit

## 2012-03-09 DIAGNOSIS — G459 Transient cerebral ischemic attack, unspecified: Secondary | ICD-10-CM

## 2012-03-09 DIAGNOSIS — I6529 Occlusion and stenosis of unspecified carotid artery: Secondary | ICD-10-CM

## 2012-03-11 ENCOUNTER — Ambulatory Visit (HOSPITAL_COMMUNITY)
Admission: RE | Admit: 2012-03-11 | Discharge: 2012-03-11 | Disposition: A | Discharge status: Home / Self Care | Payer: Self-pay | Source: Ambulatory Visit | Attending: Family Medicine | Admitting: Family Medicine

## 2012-03-11 DIAGNOSIS — G459 Transient cerebral ischemic attack, unspecified: Secondary | ICD-10-CM | POA: Insufficient documentation

## 2012-03-11 DIAGNOSIS — I6529 Occlusion and stenosis of unspecified carotid artery: Secondary | ICD-10-CM

## 2012-03-11 DIAGNOSIS — F17200 Nicotine dependence, unspecified, uncomplicated: Secondary | ICD-10-CM | POA: Insufficient documentation

## 2012-03-11 DIAGNOSIS — I10 Essential (primary) hypertension: Secondary | ICD-10-CM | POA: Insufficient documentation

## 2012-03-11 NOTE — Progress Notes (Signed)
*  PRELIMINARY RESULTS* Echocardiogram 2D Echocardiogram has been performed.  Jeryl Columbia 03/11/2012, 10:44 AM

## 2012-06-02 ENCOUNTER — Encounter (HOSPITAL_COMMUNITY): Payer: Self-pay | Admitting: Emergency Medicine

## 2012-06-02 ENCOUNTER — Emergency Department (HOSPITAL_COMMUNITY)
Admission: EM | Admit: 2012-06-02 | Discharge: 2012-06-02 | Disposition: A | Discharge status: Home / Self Care | Payer: Self-pay | Attending: Emergency Medicine | Admitting: Emergency Medicine

## 2012-06-02 ENCOUNTER — Emergency Department (HOSPITAL_COMMUNITY): Payer: Self-pay

## 2012-06-02 DIAGNOSIS — Z760 Encounter for issue of repeat prescription: Secondary | ICD-10-CM | POA: Insufficient documentation

## 2012-06-02 DIAGNOSIS — F17200 Nicotine dependence, unspecified, uncomplicated: Secondary | ICD-10-CM | POA: Insufficient documentation

## 2012-06-02 DIAGNOSIS — I10 Essential (primary) hypertension: Secondary | ICD-10-CM | POA: Insufficient documentation

## 2012-06-02 HISTORY — DX: Essential (primary) hypertension: I10

## 2012-06-02 LAB — CBC WITH DIFFERENTIAL/PLATELET
Basophils Absolute: 0 10*3/uL (ref 0.0–0.1)
Basophils Relative: 0 % (ref 0–1)
Eosinophils Absolute: 0.1 10*3/uL (ref 0.0–0.7)
Eosinophils Relative: 1 % (ref 0–5)
HCT: 46.6 % (ref 39.0–52.0)
Hemoglobin: 16.1 g/dL (ref 13.0–17.0)
Lymphocytes Relative: 20 % (ref 12–46)
Lymphs Abs: 1.6 10*3/uL (ref 0.7–4.0)
MCH: 31.9 pg (ref 26.0–34.0)
MCHC: 34.5 g/dL (ref 30.0–36.0)
MCV: 92.3 fL (ref 78.0–100.0)
Monocytes Absolute: 0.6 10*3/uL (ref 0.1–1.0)
Monocytes Relative: 8 % (ref 3–12)
Neutro Abs: 5.5 10*3/uL (ref 1.7–7.7)
Neutrophils Relative %: 71 % (ref 43–77)
Platelets: 137 10*3/uL — ABNORMAL LOW (ref 150–400)
RBC: 5.05 MIL/uL (ref 4.22–5.81)
RDW: 13.1 % (ref 11.5–15.5)
WBC: 7.8 10*3/uL (ref 4.0–10.5)

## 2012-06-02 LAB — BASIC METABOLIC PANEL
BUN: 13 mg/dL (ref 6–23)
CO2: 27 mEq/L (ref 19–32)
Calcium: 10.2 mg/dL (ref 8.4–10.5)
Chloride: 104 mEq/L (ref 96–112)
Creatinine, Ser: 1.02 mg/dL (ref 0.50–1.35)
GFR calc Af Amer: 88 mL/min — ABNORMAL LOW (ref >90)
GFR calc non Af Amer: 76 mL/min — ABNORMAL LOW (ref >90)
Glucose, Bld: 104 mg/dL — ABNORMAL HIGH (ref 70–99)
Potassium: 4.2 mEq/L (ref 3.5–5.1)
Sodium: 140 mEq/L (ref 135–145)

## 2012-06-02 MED ORDER — CLOPIDOGREL BISULFATE 75 MG PO TABS: 75.0000 mg | ORAL_TABLET | Freq: Every morning | ORAL | Status: DC

## 2012-06-02 MED ORDER — ASPIRIN EC 325 MG PO TBEC: 325.0000 mg | DELAYED_RELEASE_TABLET | Freq: Every day | ORAL | Status: DC

## 2012-06-02 MED ORDER — FAMOTIDINE 20 MG PO TABS: 20.0000 mg | ORAL_TABLET | Freq: Every morning | ORAL | Status: DC

## 2012-06-02 MED ORDER — ATORVASTATIN CALCIUM 40 MG PO TABS: 40.0000 mg | ORAL_TABLET | Freq: Every day | ORAL | Status: DC

## 2012-06-02 MED ORDER — CARVEDILOL 6.25 MG PO TABS: 6.2500 mg | ORAL_TABLET | Freq: Two times a day (BID) | ORAL | Status: DC

## 2012-06-02 NOTE — ED Notes (Signed)
Pt. Also reports intermittent left arm pain. Denies chest pain, N/V, HA, SOB. Pt. Alert and oriented laying comfortable in bed.

## 2012-06-02 NOTE — ED Notes (Signed)
TROPONIN RESULTS  cTnl  0.00 ng/mL 

## 2012-06-02 NOTE — ED Notes (Signed)
Patient transported to X-ray 

## 2012-06-02 NOTE — ED Notes (Signed)
Prescription x5 given with discharge instructions.

## 2012-06-02 NOTE — ED Notes (Signed)
Pt. Reports intermittent dizziness. States it is worse with concentration/reading. States PCP told him he is borderline diabetic, states when he eats something he feels better. Pt. Has been out of Coreg for 5 days.

## 2012-06-02 NOTE — ED Notes (Signed)
Pt reports being out of his blood pressure medication (Coreg) for 5 days. Pt c/o dizziness and blurred vision x 3 days.

## 2012-06-03 LAB — POCT I-STAT TROPONIN I: Troponin i, poc: 0 ng/mL (ref 0.00–0.08)

## 2012-06-07 NOTE — ED Provider Notes (Signed)
History    66yM presenting for medication refill of coreg. [facility] Out of coreg for past 5 days. Past 3d has had intermittent dizziness and slightly blurred vision. Attributes to possible htn although has not taken BP. No CP or SOB. NO fever or chills. No n/v. NO unusual leg pain or swelling. Smoker.  CSN: [id number]  Arrival date & time 06/02/12  1417   First MD Initiated Contact with Patient 06/02/12 2219      Chief Complaint  Patient presents with  . Medication Refill  . Dizziness    (Consider location/radiation/quality/duration/timing/severity/associated sxs/prior treatment) HPI  Past Medical History  Diagnosis Date  . Hypertension     Past Surgical History  Procedure Date  . Angioplasty     No family history on file.  History  Substance Use Topics  . Smoking status: Current Every Day Smoker  . Smokeless tobacco: Not on file  . Alcohol Use: No      Review of Systems   Review of symptoms negative unless otherwise noted in HPI.   Allergies  Review of patient's allergies indicates no known allergies.  Home Medications   Current Outpatient Rx  Name Route Sig Dispense Refill  . OMEGA-3 FATTY ACIDS 1000 MG PO CAPS Oral Take 1 g by mouth 2 (two) times daily.    . ASPIRIN EC 325 MG PO TBEC Oral Take 1 tablet (325 mg total) by mouth at bedtime. 30 tablet 2  . ATORVASTATIN CALCIUM 40 MG PO TABS Oral Take 1 tablet (40 mg total) by mouth at bedtime. 30 tablet 2  . CARVEDILOL 6.25 MG PO TABS Oral Take 1 tablet (6.25 mg total) by mouth 2 (two) times daily with a meal. Take 3.125mg  in the am & 6.25mg  in the pm 45 tablet 2  . CLOPIDOGREL BISULFATE 75 MG PO TABS Oral Take 1 tablet (75 mg total) by mouth every morning. 30 tablet 0  . FAMOTIDINE 20 MG PO TABS Oral Take 1 tablet (20 mg total) by mouth every morning. 30 tablet 2    BP 115/73  Pulse 59  Temp 98.1 F (36.7 C) (Oral)  Resp 20  SpO2 96%  Physical Exam  Nursing note and  vitals reviewed. Constitutional: He is oriented to person, place, and time. He appears well-developed and well-nourished. No distress.       Laying in bed. nad.  HENT:  Head: Normocephalic and atraumatic.  Eyes: Conjunctivae normal are normal. Right eye exhibits no discharge. Left eye exhibits no discharge.  Neck: Neck supple.  Cardiovascular: Normal rate, regular rhythm and normal heart sounds.  Exam reveals no gallop and no friction rub.   No murmur heard. Pulmonary/Chest: Effort normal and breath sounds normal. No respiratory distress.  Abdominal: Soft. He exhibits no distension. There is no tenderness.  Musculoskeletal: He exhibits no edema and no tenderness.       Lower extremities symmetric as compared to each other. No calf tenderness. Negative Homan's. No palpable cords.   Neurological: He is alert and oriented to person, place, and time. No cranial nerve deficit. He exhibits normal muscle tone. Coordination normal.  Skin: Skin is warm and dry.  Psychiatric: He has a normal mood and affect. His behavior is normal. Thought content normal.    ED Course  Procedures (including critical care time)  Labs Reviewed  CBC WITH DIFFERENTIAL - Abnormal; Notable for the following:    Platelets 137 (*)     All other components within normal  limits  BASIC METABOLIC PANEL - Abnormal; Notable for the following:    Glucose, Bld 104 (*)     GFR calc non Af Amer 76 (*)     GFR calc Af Amer 88 (*)     All other components within normal limits  LAB REPORT - SCANNED  POCT I-STAT TROPONIN I   No results found.  EKG:  Rhythm: sinus brady Rate: 54 Axis: normal Intervals/conduction: 1st degree av block. Inferior q waves ST segments: normal   1. Hypertension   2. Medication refill       MDM  66yM with htn presenting for medication refill. Also c/o intermittent dizziness, currently resolved. W/u fairly unremarkable. Feel safe for DC. [facility] Resources provided.  Encouraged to stop smoking. Outpt fu.        Raeford Razor, MD 06/07/12 [phone]

## 2012-06-18 ENCOUNTER — Emergency Department (HOSPITAL_COMMUNITY)
Admission: EM | Admit: 2012-06-18 | Discharge: 2012-06-18 | Disposition: A | Discharge status: Home / Self Care | Payer: Self-pay | Attending: Emergency Medicine | Admitting: Emergency Medicine

## 2012-06-18 ENCOUNTER — Encounter (HOSPITAL_COMMUNITY): Payer: Self-pay | Admitting: Emergency Medicine

## 2012-06-18 DIAGNOSIS — Z7982 Long term (current) use of aspirin: Secondary | ICD-10-CM | POA: Insufficient documentation

## 2012-06-18 DIAGNOSIS — R112 Nausea with vomiting, unspecified: Secondary | ICD-10-CM | POA: Insufficient documentation

## 2012-06-18 DIAGNOSIS — Z79899 Other long term (current) drug therapy: Secondary | ICD-10-CM | POA: Insufficient documentation

## 2012-06-18 DIAGNOSIS — I10 Essential (primary) hypertension: Secondary | ICD-10-CM | POA: Insufficient documentation

## 2012-06-18 DIAGNOSIS — E860 Dehydration: Secondary | ICD-10-CM | POA: Insufficient documentation

## 2012-06-18 DIAGNOSIS — E785 Hyperlipidemia, unspecified: Secondary | ICD-10-CM | POA: Insufficient documentation

## 2012-06-18 HISTORY — DX: Hyperlipidemia, unspecified: E78.5

## 2012-06-18 LAB — CBC WITH DIFFERENTIAL/PLATELET
Basophils Absolute: 0 10*3/uL (ref 0.0–0.1)
Basophils Relative: 0 % (ref 0–1)
Eosinophils Absolute: 0.2 10*3/uL (ref 0.0–0.7)
Eosinophils Relative: 1 % (ref 0–5)
HCT: 44.1 % (ref 39.0–52.0)
Hemoglobin: 14.9 g/dL (ref 13.0–17.0)
Lymphocytes Relative: 15 % (ref 12–46)
Lymphs Abs: 1.9 10*3/uL (ref 0.7–4.0)
MCH: 31.2 pg (ref 26.0–34.0)
MCHC: 33.8 g/dL (ref 30.0–36.0)
MCV: 92.5 fL (ref 78.0–100.0)
Monocytes Absolute: 0.8 10*3/uL (ref 0.1–1.0)
Monocytes Relative: 6 % (ref 3–12)
Neutro Abs: 9.8 10*3/uL — ABNORMAL HIGH (ref 1.7–7.7)
Neutrophils Relative %: 78 % — ABNORMAL HIGH (ref 43–77)
Platelets: 144 10*3/uL — ABNORMAL LOW (ref 150–400)
RBC: 4.77 MIL/uL (ref 4.22–5.81)
RDW: 13 % (ref 11.5–15.5)
WBC: 12.6 10*3/uL — ABNORMAL HIGH (ref 4.0–10.5)

## 2012-06-18 LAB — COMPREHENSIVE METABOLIC PANEL
ALT: 18 U/L (ref 0–53)
AST: 21 U/L (ref 0–37)
Albumin: 4.2 g/dL (ref 3.5–5.2)
Alkaline Phosphatase: 62 U/L (ref 39–117)
BUN: 17 mg/dL (ref 6–23)
CO2: 26 mEq/L (ref 19–32)
Calcium: 9.8 mg/dL (ref 8.4–10.5)
Chloride: 101 mEq/L (ref 96–112)
Creatinine, Ser: 1.34 mg/dL (ref 0.50–1.35)
GFR calc Af Amer: 63 mL/min — ABNORMAL LOW (ref >90)
GFR calc non Af Amer: 54 mL/min — ABNORMAL LOW (ref >90)
Glucose, Bld: 122 mg/dL — ABNORMAL HIGH (ref 70–99)
Potassium: 3.6 mEq/L (ref 3.5–5.1)
Sodium: 138 mEq/L (ref 135–145)
Total Bilirubin: 0.7 mg/dL (ref 0.3–1.2)
Total Protein: 7.2 g/dL (ref 6.0–8.3)

## 2012-06-18 LAB — URINALYSIS, ROUTINE W REFLEX MICROSCOPIC
Bilirubin Urine: NEGATIVE
Glucose, UA: NEGATIVE mg/dL
Hgb urine dipstick: NEGATIVE
Ketones, ur: NEGATIVE mg/dL
Leukocytes, UA: NEGATIVE
Nitrite: NEGATIVE
Protein, ur: NEGATIVE mg/dL
Specific Gravity, Urine: 1.017 (ref 1.005–1.030)
Urobilinogen, UA: 1 mg/dL (ref 0.0–1.0)
pH: 6.5 (ref 5.0–8.0)

## 2012-06-18 LAB — LIPASE, BLOOD: Lipase: 27 U/L (ref 11–59)

## 2012-06-18 MED ORDER — MORPHINE SULFATE 4 MG/ML IJ SOLN
4.0000 mg | Freq: Once | INTRAMUSCULAR | Status: AC
  Administered 2012-06-18: 4 mg via INTRAVENOUS
  Filled 2012-06-18: qty 1

## 2012-06-18 MED ORDER — ONDANSETRON HCL 4 MG/2ML IJ SOLN
4.0000 mg | Freq: Once | INTRAMUSCULAR | Status: AC
  Administered 2012-06-18: 4 mg via INTRAVENOUS
  Filled 2012-06-18: qty 2

## 2012-06-18 MED ORDER — PROMETHAZINE HCL 25 MG PO TABS: 25.0000 mg | ORAL_TABLET | Freq: Four times a day (QID) | ORAL | Status: DC | PRN

## 2012-06-18 MED ORDER — SODIUM CHLORIDE 0.9 % IV BOLUS (SEPSIS)
1000.0000 mL | Freq: Once | INTRAVENOUS | Status: AC
  Administered 2012-06-18: 1000 mL via INTRAVENOUS

## 2012-06-18 NOTE — ED Notes (Signed)
Patient complaining of abdominal pain, nausea, and vomiting after eating ice cream tonight.  Patient vomiting yellow emesis in triage.

## 2012-06-18 NOTE — ED Notes (Signed)
Pt. C/o abdominal pain, states "I was feeling fine until I ate some ice cream". Pt. Moaning, states still a little nauseated.

## 2012-06-18 NOTE — ED Provider Notes (Signed)
History     CSN: [id number]  Arrival date & time 06/18/12  0005   First MD Initiated Contact with Patient 06/18/12 0101      Chief Complaint  Patient presents with  . Emesis  . Abdominal Pain    (Consider location/radiation/quality/duration/timing/severity/associated sxs/prior treatment) HPI History provided by pt.   Pt developed nausea and several episodes of vomiting yesterday evening, approximately 2 hours after eating [place] that he cooked himself for dinner, and immediately after eating ice cream.  Had lightheadedness and blurred vision while vomiting and developed generalized weakness and diffuse abdominal pain afterwards.  Denies fever, chest pain, SOB, diarrhea, hematemesis/hematochezia/melena and urinary sx.  Pt believes vomiting may be secondary to food poisoning, or eating ice cream which he hasn't had in 2 years.  No h/o abd surgeries.  PMH sig for MI, which presented with CP.    Past Medical History  Diagnosis Date  . Hypertension   . Hyperlipidemia     Past Surgical History  Procedure Date  . Angioplasty     History reviewed. No pertinent family history.  History  Substance Use Topics  . Smoking status: Current Every Day Smoker  . Smokeless tobacco: Not on file  . Alcohol Use: No      Review of Systems  All other systems reviewed and are negative.    Allergies  Review of patient's allergies indicates no known allergies.  Home Medications   Current Outpatient Rx  Name Route Sig Dispense Refill  . ASPIRIN EC 325 MG PO TBEC Oral Take 1 tablet (325 mg total) by mouth at bedtime. 30 tablet 2  . ATORVASTATIN CALCIUM 40 MG PO TABS Oral Take 1 tablet (40 mg total) by mouth at bedtime. 30 tablet 2  . CARVEDILOL 6.25 MG PO TABS Oral Take 1 tablet (6.25 mg total) by mouth 2 (two) times daily with a meal. Take 3.125mg  in the am & 6.25mg  in the pm 45 tablet 2  . CLOPIDOGREL BISULFATE 75 MG PO TABS Oral Take 1 tablet (75 mg total) by mouth every morning. 30  tablet 0  . FAMOTIDINE 20 MG PO TABS Oral Take 1 tablet (20 mg total) by mouth every morning. 30 tablet 2  . OMEGA-3 FATTY ACIDS 1000 MG PO CAPS Oral Take 1 g by mouth 2 (two) times daily.      BP 125/74  Pulse 65  Temp 98.1 F (36.7 C) (Oral)  Resp 18  SpO2 97%  Physical Exam  Nursing note and vitals reviewed. Constitutional: He is oriented to person, place, and time. He appears well-developed and well-nourished. No distress.  HENT:  Head: Normocephalic and atraumatic.  Mouth/Throat: Oropharynx is clear and moist.  Eyes:       Normal appearance  Neck: Normal range of motion.  Cardiovascular: Normal rate and regular rhythm.   Pulmonary/Chest: Effort normal and breath sounds normal. No respiratory distress.  Abdominal: Soft. Bowel sounds are normal. He exhibits no distension and no mass. There is no tenderness. There is no rebound and no guarding.       Palpation of abdomen as well as movement in bed induces nausea.    Genitourinary:       No CVA tenderness  Musculoskeletal: Normal range of motion.  Neurological: He is alert and oriented to person, place, and time.  Skin: Skin is warm and dry. No rash noted.  Psychiatric: He has a normal mood and affect. His behavior is normal.    ED Course  Procedures (including  critical care time)   Date: 06/18/2012  Rate: 64  Rhythm: normal sinus rhythm  QRS Axis: normal  Intervals: normal  ST/T Wave abnormalities: normal  Conduction Disutrbances:none  Narrative Interpretation:   Old EKG Reviewed: unchanged   Labs Reviewed  CBC WITH DIFFERENTIAL - Abnormal; Notable for the following:    WBC 12.6 (*)     Platelets 144 (*)     Neutrophils Relative 78 (*)     Neutro Abs 9.8 (*)     All other components within normal limits  COMPREHENSIVE METABOLIC PANEL - Abnormal; Notable for the following:    Glucose, Bld 122 (*)     GFR calc non Af Amer 54 (*)     GFR calc Af Amer 63 (*)     All other components within normal limits    URINALYSIS, ROUTINE W REFLEX MICROSCOPIC  LIPASE, BLOOD   No results found.   1. Nausea and vomiting   2. Dehydration       MDM  66yo M presents w/ N/V.  No associated CP/SOB; h/o MI but presented differently.  Screening EKG ordered.  Afebrile and abdomen benign on exam.  Labs unremarkable.  Pt receiving IV fluids and zofran.  Will reassess shortly.  1:58 AM   Patient's blood pressure has been fluctuating and was as low as 89 systolic.  With 3+ liters NS, his pressure has improved.  Temp 95.5 but pt is non-toxic appearing, nausea is resolved and he is tolerating pos.  Will ambulate and d/c home w/ anti-emetic.  Dr. Juleen China has examined and is in agreement w/ A&P.  Return precautions discussed. 6:18 AM         Otilio Miu, [place] 06/18/12 [phone]

## 2012-06-18 NOTE — ED Notes (Signed)
Prescription given with discharge instructions. Pt alert and oriented, stable condition.

## 2012-06-26 NOTE — ED Provider Notes (Signed)
Medical screening examination/treatment/procedure(s) were performed by non-physician practitioner and as supervising physician I was immediately available for consultation/collaboration.  Raeford Razor, MD 06/26/12 1723

## 2012-09-03 ENCOUNTER — Emergency Department (HOSPITAL_COMMUNITY)
Admission: EM | Admit: 2012-09-03 | Discharge: 2012-09-03 | Disposition: A | Discharge status: Home / Self Care | Payer: Self-pay | Source: Home / Self Care

## 2012-09-03 ENCOUNTER — Encounter (HOSPITAL_COMMUNITY): Payer: Self-pay

## 2012-09-03 DIAGNOSIS — E785 Hyperlipidemia, unspecified: Secondary | ICD-10-CM

## 2012-09-03 DIAGNOSIS — R5383 Other fatigue: Secondary | ICD-10-CM

## 2012-09-03 DIAGNOSIS — I10 Essential (primary) hypertension: Secondary | ICD-10-CM

## 2012-09-03 DIAGNOSIS — I2510 Atherosclerotic heart disease of native coronary artery without angina pectoris: Secondary | ICD-10-CM

## 2012-09-03 MED ORDER — ATORVASTATIN CALCIUM 40 MG PO TABS: 40.0000 mg | ORAL_TABLET | Freq: Every day | ORAL | Status: DC

## 2012-09-03 MED ORDER — ASPIRIN EC 325 MG PO TBEC: 325.0000 mg | DELAYED_RELEASE_TABLET | Freq: Every day | ORAL | Status: DC

## 2012-09-03 MED ORDER — OMEGA-3 FATTY ACIDS 1000 MG PO CAPS: 1.0000 g | ORAL_CAPSULE | Freq: Two times a day (BID) | ORAL | Status: DC

## 2012-09-03 MED ORDER — CARVEDILOL 6.25 MG PO TABS: 6.2500 mg | ORAL_TABLET | Freq: Two times a day (BID) | ORAL | Status: DC

## 2012-09-03 MED ORDER — CLOPIDOGREL BISULFATE 75 MG PO TABS: 75.0000 mg | ORAL_TABLET | Freq: Every morning | ORAL | Status: DC

## 2012-09-03 MED ORDER — FAMOTIDINE 20 MG PO TABS: 20.0000 mg | ORAL_TABLET | Freq: Every morning | ORAL | Status: DC

## 2012-09-03 NOTE — ED Provider Notes (Signed)
History     CSN: [id number]  Arrival date & time 09/03/12  1706   First MD Initiated Contact with Patient 09/03/12 1838      Chief Complaint  Patient presents with  . Dizziness    (Consider location/radiation/quality/duration/timing/severity/associated sxs/prior treatment) HPI Patient comes to the clinic today for medication refill. Also complains of fatigue especially in the morning on some days. He denies passing out, but says that he feels very sleepy. Patient has a history of CAD and has been taking Coreg 6.25 by mouth twice a day.He also has been taking Lipitor for hyperlipidemia  Past Medical History  Diagnosis Date  . Hypertension   . Hyperlipidemia     Past Surgical History  Procedure Date  . Angioplasty     No family history on file.  History  Substance Use Topics  . Smoking status: Current Every Day Smoker  . Smokeless tobacco: Not on file  . Alcohol Use: No      Review of Systems  Review of Systems:  HEENT: Denies headache, positive blurred vision, no runny nose, sore throat,  Neck: Denies thyroid problems,lymphadenopathy Chest : Denies shortness of breath,  Heart : Denies Chest pain,  Positive history ofcoronary arterey disease GI: Denies  nausea, vomiting, diarrhea, constipation GU: Denies dysuria, urgency, frequency of urination, hematuria Neuro: Denies stroke, seizures, syncope        Allergies  Review of patient's allergies indicates no known allergies.  Home Medications   Current Outpatient Rx  Name  Route  Sig  Dispense  Refill  . ASPIRIN EC 325 MG PO TBEC   Oral   Take 1 tablet (325 mg total) by mouth at bedtime.   30 tablet   2   . ATORVASTATIN CALCIUM 40 MG PO TABS   Oral   Take 1 tablet (40 mg total) by mouth at bedtime.   30 tablet   2   . CARVEDILOL 6.25 MG PO TABS   Oral   Take 1 tablet (6.25 mg total) by mouth 2 (two) times daily with a meal. Take 3.125mg  in the am & 6.25mg  in the pm   45 tablet   2   .  CLOPIDOGREL BISULFATE 75 MG PO TABS   Oral   Take 1 tablet (75 mg total) by mouth every morning.   30 tablet   0   . FAMOTIDINE 20 MG PO TABS   Oral   Take 1 tablet (20 mg total) by mouth every morning.   30 tablet   2   . OMEGA-3 FATTY ACIDS 1000 MG PO CAPS   Oral   Take 1 capsule (1 g total) by mouth 2 (two) times daily.   30 capsule   2   . PROMETHAZINE HCL 25 MG PO TABS   Oral   Take 1 tablet (25 mg total) by mouth every 6 (six) hours as needed for nausea.   20 tablet   0     BP 121/73  Pulse 62  Temp 98 F (36.7 C) (Oral)  Resp 19  SpO2 99%  Physical Exam      Constitutional:   Patient is a well-developed and well-nourished male in no acute distress and cooperative with exam. Head: Normocephalic and atraumatic Mouth: Mucus membranes moist Eyes: PERRL, EOMI, conjunctivae normal Neck: Supple, No Thyromegaly Cardiovascular: RRR, S1 normal, S2 normal Pulmonary/Chest: CTAB, no wheezes, rales, or rhonchi Abdominal: Soft. Non-tender, non-distended, bowel sounds are normal, no masses, organomegaly, or guarding present.  Neurological: A&O  x3, Strenght is normal and symmetric bilaterally, cranial nerve II-XII are grossly intact, no focal motor deficit, sensory intact to light touch bilaterally.  Extremities : No Cyanosis, Clubbing or Edema   ED Course  Procedures (including critical care time)   Labs Reviewed  HEMOGLOBIN A1C   No results found.   1. Hyperlipidemia   2. Fatigue   3. HTN (hypertension)   4. CAD (coronary artery disease)    Fatigue Patient has been having fatigue on some days, which could be due to orthostatic hypotension, muscle aches due to statins. I have instructed the patient to check the blood pressure at home, and he will bring the recordings in the next visit. He also could be having myopathy due to Lipitor so we'll check CPK level. Also patient may be developing prediabetes, so will check hemoglobin A1c  Hypertension We'll  give the refill for Coreg 6.25 by mouth twice a day  CAD Continue Plavix, aspirin, Coreg, Lipitor.  Follow up in 2 weeks   MDM

## 2012-09-03 NOTE — ED Notes (Signed)
Patient states needs medication refill-also complains of bouts of dizziness at times

## 2012-12-07 ENCOUNTER — Encounter (HOSPITAL_COMMUNITY): Payer: Self-pay

## 2012-12-07 ENCOUNTER — Emergency Department (HOSPITAL_COMMUNITY)
Admission: EM | Admit: 2012-12-07 | Discharge: 2012-12-07 | Disposition: A | Discharge status: Home / Self Care | Payer: Self-pay | Source: Home / Self Care

## 2012-12-07 DIAGNOSIS — E785 Hyperlipidemia, unspecified: Secondary | ICD-10-CM

## 2012-12-07 DIAGNOSIS — I10 Essential (primary) hypertension: Secondary | ICD-10-CM

## 2012-12-07 MED ORDER — CARVEDILOL 6.25 MG PO TABS: 6.2500 mg | ORAL_TABLET | Freq: Two times a day (BID) | ORAL | Status: DC

## 2012-12-07 MED ORDER — FAMOTIDINE 20 MG PO TABS: 20.0000 mg | ORAL_TABLET | Freq: Two times a day (BID) | ORAL | Status: DC

## 2012-12-07 MED ORDER — ATORVASTATIN CALCIUM 40 MG PO TABS: 40.0000 mg | ORAL_TABLET | Freq: Every day | ORAL | Status: DC

## 2012-12-07 MED ORDER — CLOPIDOGREL BISULFATE 75 MG PO TABS: 75.0000 mg | ORAL_TABLET | Freq: Every morning | ORAL | Status: DC

## 2012-12-07 NOTE — ED Notes (Signed)
Patient here for medication refill History of Hypertension

## 2012-12-07 NOTE — ED Provider Notes (Signed)
History     CSN: [id number]  Arrival date & time 12/07/12  1152   First MD Initiated Contact with Patient 12/07/12 1220      Chief Complaint  Patient presents with  . Medication Refill    (Consider location/radiation/quality/duration/timing/severity/associated sxs/prior treatment) HPI Patient is a 67 year old male who presents for regular followup on blood pressure. He needs refills on his medicines. He explains he has been checking blood pressure regularly and the numbers usually stay lower than 130/80. Patient denies chest pain or shortness of breath, no recent sicknesses or hospitalizations, no abdominal or urinary concerns, no specific focal neurological symptoms.  Past Medical History  Diagnosis Date  . Hypertension   . Hyperlipidemia     Past Surgical History  Procedure Laterality Date  . Angioplasty      Family history of high blood pressure present  History  Substance Use Topics  . Smoking status: Current Every Day Smoker  . Smokeless tobacco: Not on file  . Alcohol Use: No      Review of Systems  Constitutional: Negative for fever, chills, diaphoresis, activity change, appetite change and fatigue.  HENT: Negative for ear pain, nosebleeds, congestion, facial swelling, rhinorrhea, neck pain, neck stiffness and ear discharge.   Eyes: Negative for pain, discharge, redness, itching and visual disturbance.  Respiratory: Negative for cough, choking, chest tightness, shortness of breath, wheezing and stridor.   Cardiovascular: Negative for chest pain, palpitations and leg swelling.  Gastrointestinal: Negative for abdominal distention.  Genitourinary: Negative for dysuria, urgency, frequency, hematuria, flank pain, decreased urine volume, difficulty urinating and dyspareunia.  Musculoskeletal: Negative for back pain, joint swelling, arthralgias and gait problem.  Neurological: Negative for dizziness, tremors, seizures, syncope, facial asymmetry, speech difficulty,  weakness, light-headedness, numbness and headaches.  Hematological: Negative for adenopathy. Does not bruise/bleed easily.  Psychiatric/Behavioral: Negative for hallucinations, behavioral problems, confusion, dysphoric mood, decreased concentration and agitation.    Allergies  Review of patient's allergies indicates no known allergies.  Home Medications   Current Outpatient Rx  Name  Route  Sig  Dispense  Refill  . aspirin EC 325 MG tablet   Oral   Take 1 tablet (325 mg total) by mouth at bedtime.   30 tablet   2   . atorvastatin (LIPITOR) 40 MG tablet   Oral   Take 1 tablet (40 mg total) by mouth at bedtime.   30 tablet   5   . carvedilol (COREG) 6.25 MG tablet   Oral   Take 1 tablet (6.25 mg total) by mouth 2 (two) times daily with a meal. Take 3.125mg  in the am & 6.25mg  in the pm   60 tablet   5   . clopidogrel (PLAVIX) 75 MG tablet   Oral   Take 1 tablet (75 mg total) by mouth every morning.   30 tablet   5   . famotidine (PEPCID) 20 MG tablet   Oral   Take 1 tablet (20 mg total) by mouth 2 (two) times daily.   60 tablet   5   . fish oil-omega-3 fatty acids 1000 MG capsule   Oral   Take 1 capsule (1 g total) by mouth 2 (two) times daily.   30 capsule   2     BP 130/85  Pulse 73  Temp(Src) 97.1 F (36.2 C) (Oral)  SpO2 99%  Physical Exam  Constitutional: Appears well-developed and well-nourished. No distress.  HENT: Normocephalic. External right and left ear normal. Oropharynx is clear and moist.  Eyes: Conjunctivae and EOM are normal. PERRLA, no scleral icterus.  Neck: Normal ROM. Neck supple. No JVD. No tracheal deviation. No thyromegaly.  CVS: RRR, S1/S2 +, no murmurs, no gallops, no carotid bruit.  Pulmonary: Effort and breath sounds normal, no stridor, rhonchi, wheezes, rales.  Abdominal: Soft. BS +,  no distension, tenderness, rebound or guarding.  Musculoskeletal: Normal range of motion. No edema and no tenderness.  Lymphadenopathy: No  lymphadenopathy noted, cervical, inguinal. Neuro: Alert. Normal reflexes, muscle tone coordination. No cranial nerve deficit. Skin: Skin is warm and dry. No rash noted. Not diaphoretic. No erythema. No pallor.  Psychiatric: Normal mood and affect. Behavior, judgment, thought content normal.    ED Course  Procedures (including critical care time)  Labs Reviewed - No data to display No results found.   1. Hyperlipidemia - we will provide refill on Lipitor, patient advised to continue monitoring his diet   2. HTN (hypertension) - well controlled, I advised patient to continue monitoring blood pressure regularly and to call us back if blood pressure higher than 140/90 persistently       MDM  Hypertension hyperlipidemia, requiring refill on medication        Dorothea Ogle, MD 12/07/12 1254

## 2013-02-06 ENCOUNTER — Encounter (HOSPITAL_COMMUNITY): Payer: Self-pay | Admitting: Emergency Medicine

## 2013-02-06 ENCOUNTER — Emergency Department (HOSPITAL_COMMUNITY): Payer: Self-pay

## 2013-02-06 ENCOUNTER — Emergency Department (HOSPITAL_COMMUNITY)
Admission: EM | Admit: 2013-02-06 | Discharge: 2013-02-06 | Disposition: A | Discharge status: Home / Self Care | Payer: Self-pay | Attending: Emergency Medicine | Admitting: Emergency Medicine

## 2013-02-06 DIAGNOSIS — F17200 Nicotine dependence, unspecified, uncomplicated: Secondary | ICD-10-CM | POA: Insufficient documentation

## 2013-02-06 DIAGNOSIS — I10 Essential (primary) hypertension: Secondary | ICD-10-CM | POA: Insufficient documentation

## 2013-02-06 DIAGNOSIS — Z7982 Long term (current) use of aspirin: Secondary | ICD-10-CM | POA: Insufficient documentation

## 2013-02-06 DIAGNOSIS — E785 Hyperlipidemia, unspecified: Secondary | ICD-10-CM | POA: Insufficient documentation

## 2013-02-06 DIAGNOSIS — R0789 Other chest pain: Secondary | ICD-10-CM | POA: Insufficient documentation

## 2013-02-06 DIAGNOSIS — Z7901 Long term (current) use of anticoagulants: Secondary | ICD-10-CM | POA: Insufficient documentation

## 2013-02-06 DIAGNOSIS — Z79899 Other long term (current) drug therapy: Secondary | ICD-10-CM | POA: Insufficient documentation

## 2013-02-06 LAB — COMPREHENSIVE METABOLIC PANEL
ALT: 11 U/L (ref 0–53)
Alkaline Phosphatase: 56 U/L (ref 39–117)
CO2: 26 mEq/L (ref 19–32)
Chloride: 103 mEq/L (ref 96–112)
GFR calc Af Amer: 83 mL/min — ABNORMAL LOW (ref >90)
GFR calc non Af Amer: 71 mL/min — ABNORMAL LOW (ref >90)
Glucose, Bld: 104 mg/dL — ABNORMAL HIGH (ref 70–99)
Potassium: 3.7 mEq/L (ref 3.5–5.1)
Sodium: 138 mEq/L (ref 135–145)

## 2013-02-06 LAB — CBC WITH DIFFERENTIAL/PLATELET
Basophils Absolute: 0 K/uL (ref 0.0–0.1)
Basophils Relative: 0 % (ref 0–1)
Eosinophils Absolute: 0.1 K/uL (ref 0.0–0.7)
Eosinophils Relative: 2 % (ref 0–5)
HCT: 44.4 % (ref 39.0–52.0)
Hemoglobin: 15.3 g/dL (ref 13.0–17.0)
Lymphocytes Relative: 13 % (ref 12–46)
Lymphs Abs: 1.1 10*3/uL (ref 0.7–4.0)
MCH: 31.5 pg (ref 26.0–34.0)
MCHC: 34.5 g/dL (ref 30.0–36.0)
MCV: 91.5 fL (ref 78.0–100.0)
Monocytes Absolute: 0.4 K/uL (ref 0.1–1.0)
Monocytes Relative: 5 % (ref 3–12)
Neutro Abs: 7.2 K/uL (ref 1.7–7.7)
Neutrophils Relative %: 81 % — ABNORMAL HIGH (ref 43–77)
Platelets: 136 10*3/uL — ABNORMAL LOW (ref 150–400)
RBC: 4.85 MIL/uL (ref 4.22–5.81)
RDW: 13.1 % (ref 11.5–15.5)
WBC: 8.9 10*3/uL (ref 4.0–10.5)

## 2013-02-06 LAB — URINALYSIS, ROUTINE W REFLEX MICROSCOPIC
Bilirubin Urine: NEGATIVE
Glucose, UA: NEGATIVE mg/dL
Hgb urine dipstick: NEGATIVE
Ketones, ur: NEGATIVE mg/dL
Leukocytes, UA: NEGATIVE
Nitrite: NEGATIVE
Protein, ur: NEGATIVE mg/dL
Specific Gravity, Urine: 1.015 (ref 1.005–1.030)
Urobilinogen, UA: 0.2 mg/dL (ref 0.0–1.0)
pH: 5.5 (ref 5.0–8.0)

## 2013-02-06 LAB — POCT I-STAT TROPONIN I
Troponin i, poc: 0.01 ng/mL (ref 0.00–0.08)
Troponin i, poc: 0.03 ng/mL (ref 0.00–0.08)

## 2013-02-06 LAB — COMPREHENSIVE METABOLIC PANEL WITH GFR
AST: 15 U/L (ref 0–37)
Albumin: 3.9 g/dL (ref 3.5–5.2)
BUN: 13 mg/dL (ref 6–23)
Calcium: 9.6 mg/dL (ref 8.4–10.5)
Creatinine, Ser: 1.07 mg/dL (ref 0.50–1.35)
Total Bilirubin: 0.9 mg/dL (ref 0.3–1.2)
Total Protein: 7.2 g/dL (ref 6.0–8.3)

## 2013-02-06 LAB — LIPASE, BLOOD: Lipase: 26 U/L (ref 11–59)

## 2013-02-06 NOTE — ED Provider Notes (Signed)
History     CSN: [id number]  Arrival date & time 02/06/13  1810   First MD Initiated Contact with Patient 02/06/13 1831      Chief Complaint  Patient presents with  . Abdominal Pain    (Consider location/radiation/quality/duration/timing/severity/associated sxs/prior treatment) HPI Comments: Pt with h/o CAD s/p angioplasty times 2 about 5 years ago, reports had eaten late last night around midnight, skipped eating today to compensate he reports, had gone to the grocery store and had come home, still had not eaten yet, had onset of focal pain to lower sternal region that he indicates location by pointing with one finger, doesn't radiate, lasted about 40 minutes, not improved with ASA or 2 older NTG's.  Pain began around 5 PM.  However pain has resolved on its own now.  He reports he was diaphoretic, but also admits he was quite hot from being out and going to the grocery store.  No N/V, no SOB.  He also denies recent cough, cold symptoms.  He has not followed up with his cardiologist in many years, has not had a stress test or cardiac cath recently.    Patient is a 67 y.o. male presenting with abdominal pain. The history is provided by the patient and medical records.  Abdominal Pain Associated symptoms include chest pain and abdominal pain. Pertinent negatives include no shortness of breath.    Past Medical History  Diagnosis Date  . Hypertension   . Hyperlipidemia     Past Surgical History  Procedure Laterality Date  . Angioplasty      History reviewed. No pertinent family history.  History  Substance Use Topics  . Smoking status: Current Every Day Smoker  . Smokeless tobacco: Not on file  . Alcohol Use: No      Review of Systems  Constitutional: Positive for diaphoresis. Negative for fever, chills and fatigue.  HENT: Negative for congestion, rhinorrhea and neck pain.   Respiratory: Negative for cough and shortness of breath.   Cardiovascular: Positive for chest pain.   Gastrointestinal: Positive for abdominal pain. Negative for nausea.  Musculoskeletal: Negative for back pain.  Neurological: Negative for dizziness and syncope.  All other systems reviewed and are negative.    Allergies  Review of patient's allergies indicates no known allergies.  Home Medications   Current Outpatient Rx  Name  Route  Sig  Dispense  Refill  . aspirin 325 MG EC tablet   Oral   Take 325 mg by mouth daily.         Marland Kitchen atorvastatin (LIPITOR) 40 MG tablet   Oral   Take 40 mg by mouth at bedtime.         . carvedilol (COREG) 6.25 MG tablet   Oral   Take 6.25 mg by mouth 2 (two) times daily with a meal.         . clopidogrel (PLAVIX) 75 MG tablet   Oral   Take 75 mg by mouth daily.         . famotidine (PEPCID) 20 MG tablet   Oral   Take 20 mg by mouth 2 (two) times daily.         . fish oil-omega-3 fatty acids 1000 MG capsule   Oral   Take 2 g by mouth daily.         . nitroGLYCERIN (NITROSTAT) 0.4 MG SL tablet   Sublingual   Place 0.4 mg under the tongue every 5 (five) minutes as needed for chest pain.  BP 128/67  Pulse 66  Temp(Src) 98.1 F (36.7 C) (Oral)  Resp 16  SpO2 97%  Physical Exam  Nursing note and vitals reviewed. Constitutional: He is oriented to person, place, and time. He appears well-developed and well-nourished. No distress.  HENT:  Head: Normocephalic and atraumatic.  Eyes: Conjunctivae are normal.  Neck: Normal range of motion. Neck supple. No JVD present.  Cardiovascular: Normal rate, regular rhythm and intact distal pulses.   Pulmonary/Chest: Effort normal. No respiratory distress. He has no wheezes. He has no rales.  Abdominal: Soft. He exhibits no distension. There is no tenderness. There is no rebound and no guarding.  Neurological: He is alert and oriented to person, place, and time. He exhibits normal muscle tone. Coordination normal.  Skin: Skin is warm and dry. No rash noted. He is not  diaphoretic.    ED Course  Procedures (including critical care time)  Labs Reviewed  CBC WITH DIFFERENTIAL  COMPREHENSIVE METABOLIC PANEL  LIPASE, BLOOD  URINALYSIS, ROUTINE W REFLEX MICROSCOPIC   No results found.   Impression: Chest pain  ra sat is 97% and I interpret to be normal  ECG at time 18:20 shows sinus bradycardia at rate 56, first degree AV block, early RS transition in lead V2, no ST or T wave abn's.  Borderline Q waves seen inferiorly.  Q waves are less pronounced compared to ECG from 06/18/12.  Otherwise no changes.      8PM Pt was still feeling well, no recurrences of CP or epigastric pain.  Pt to have 2nd troponin at 10 PM and if neg, pt can be discharged to have close follow up with his cardiologist at [facility].  MDM  Pt with atypical pain, however possibly associated with history of being diaphoretic.  Pt reports no exertional CP or tightness recently.  Has not had a heart cath in years.  Old records indicated angioplasty in 2009 for an occluded RCA by Dr. Jenne Campus with [facility].  Will monitor for re-currence, perform serial troponins.  If neg times 2 (pain began at 5 PM, over by 6 PM, will obtain serial troponins now and again at 10 PM.            Gavin Pound. Lovelyn Sheeran, MD 02/07/13 1100

## 2013-02-06 NOTE — ED Provider Notes (Signed)
  Physical Exam  BP 131/74  Pulse 69  Temp(Src) 98.1 F (36.7 C) (Oral)  Resp 13  SpO2 95%  Physical Exam Patient is awaiting second troponin 2 at 10 PM since been reviewed and is negative.  He will be discharged home with instructions to followup by his cardiologist ED Course  Procedures  MDM Discussed lab findings with patient recommended.  He followup with his cardiologist next week    Arman Filter, NP 02/06/13 2242

## 2013-02-06 NOTE — ED Notes (Signed)
Pt BIB EMS from home with c/o sudden onset of epigastric pain. 324mg  ASA taken prior to EMS arrival. Pt on plavix for PMH of CABG and other cardiac history. Pain free on ED arrival

## 2013-08-11 ENCOUNTER — Encounter: Payer: Self-pay | Admitting: Internal Medicine

## 2013-08-11 ENCOUNTER — Ambulatory Visit: Payer: No Typology Code available for payment source | Attending: Internal Medicine | Admitting: Internal Medicine

## 2013-08-11 VITALS — BP 117/75 | HR 79 | Temp 98.8°F | Resp 14 | Ht 65.0 in | Wt 200.0 lb

## 2013-08-11 DIAGNOSIS — H5460 Unqualified visual loss, one eye, unspecified: Secondary | ICD-10-CM | POA: Insufficient documentation

## 2013-08-11 DIAGNOSIS — I2510 Atherosclerotic heart disease of native coronary artery without angina pectoris: Secondary | ICD-10-CM | POA: Insufficient documentation

## 2013-08-11 DIAGNOSIS — E119 Type 2 diabetes mellitus without complications: Secondary | ICD-10-CM

## 2013-08-11 LAB — COMPLETE METABOLIC PANEL WITH GFR
Albumin: 4.5 g/dL (ref 3.5–5.2)
CO2: 27 mEq/L (ref 19–32)
Calcium: 9.9 mg/dL (ref 8.4–10.5)
Chloride: 106 mEq/L (ref 96–112)
GFR, Est African American: >89 mL/min
GFR, Est Non African American: 87 mL/min
Glucose, Bld: 87 mg/dL (ref 70–99)
Potassium: 3.9 mEq/L (ref 3.5–5.3)
Sodium: 141 mEq/L (ref 135–145)
Total Bilirubin: 0.6 mg/dL (ref 0.3–1.2)
Total Protein: 7 g/dL (ref 6.0–8.3)

## 2013-08-11 LAB — LIPID PANEL
Cholesterol: 209 mg/dL — ABNORMAL HIGH (ref 0–200)
Total CHOL/HDL Ratio: 4.4 Ratio

## 2013-08-11 LAB — POCT GLYCOSYLATED HEMOGLOBIN (HGB A1C): Hemoglobin A1C: 5.5

## 2013-08-11 MED ORDER — CLOPIDOGREL BISULFATE 75 MG PO TABS: 75.0000 mg | ORAL_TABLET | Freq: Every day | ORAL | Status: DC

## 2013-08-11 MED ORDER — FAMOTIDINE 20 MG PO TABS: 20.0000 mg | ORAL_TABLET | Freq: Two times a day (BID) | ORAL | Status: DC

## 2013-08-11 MED ORDER — ATORVASTATIN CALCIUM 40 MG PO TABS: 40.0000 mg | ORAL_TABLET | Freq: Every day | ORAL | Status: DC

## 2013-08-11 MED ORDER — CARVEDILOL 6.25 MG PO TABS: 6.2500 mg | ORAL_TABLET | Freq: Two times a day (BID) | ORAL | Status: DC

## 2013-08-11 MED ORDER — ASPIRIN 325 MG PO TBEC: 325.0000 mg | DELAYED_RELEASE_TABLET | Freq: Every day | ORAL | Status: DC

## 2013-08-11 MED ORDER — NICOTINE 21 MG/24HR TD PT24
21.0000 mg | MEDICATED_PATCH | Freq: Every day | TRANSDERMAL | Status: DC
Start: 2013-08-11 — End: 2013-12-24

## 2013-08-11 NOTE — Progress Notes (Signed)
Pt is here to establish care. Pt medication review, requests an HBA1c; low blood sugar frequently.

## 2013-08-11 NOTE — Progress Notes (Signed)
Patient ID: Samuel Lawson, male   DOB: 06/22/1946, 67 y.o.   MRN: [id number]  CC:  HPI:  67 year old male with a history of nicotine dependence, status post angioplasty x2, hyperlipidemia, currently on aspirin and Plavix, who presents with symptoms of occasional blurry vision in the right eye especially when he wakes up in the morning. His symptoms have been present for several months. Unfortunately he continues to smoke a pack a day. He also admits to eating steak and red meat.  His also concerned about increased urinary frequency Old records indicated angioplasty in 2009 for an occluded RCA by Dr. Jenne Campus with [facility].   Carotid ultrasound in January 2013 showed 40-59% ICA stenosis on the left, no significant ICA stenosis on the right  No Known Allergies Past Medical History  Diagnosis Date  . Hypertension   . Hyperlipidemia    Current Outpatient Prescriptions on File Prior to Visit  Medication Sig Dispense Refill  . fish oil-omega-3 fatty acids 1000 MG capsule Take 2 g by mouth daily.      . nitroGLYCERIN (NITROSTAT) 0.4 MG SL tablet Place 0.4 mg under the tongue every 5 (five) minutes as needed for chest pain.      . [DISCONTINUED] promethazine (PHENERGAN) 25 MG tablet Take 1 tablet (25 mg total) by mouth every 6 (six) hours as needed for nausea.  20 tablet  0   No current facility-administered medications on file prior to visit.   Family History  Problem Relation Age of Onset  . Stroke Father    History   Social History  . Marital Status: Single    Spouse Name: N/A    Number of Children: N/A  . Years of Education: N/A   Occupational History  . Not on file.   Social History Main Topics  . Smoking status: Current Every Day Smoker  . Smokeless tobacco: Not on file  . Alcohol Use: No  . Drug Use: No  . Sexual Activity: Not on file   Other Topics Concern  . Not on file   Social History Narrative  . No narrative on file    Review of Systems  Constitutional:  Negative for fever, chills, diaphoresis, activity change, appetite change and fatigue.  HENT: Negative for ear pain, nosebleeds, congestion, facial swelling, rhinorrhea, neck pain, neck stiffness and ear discharge.   Eyes: Negative for pain, discharge, redness, itching and visual disturbance.  Respiratory: Negative for cough, choking, chest tightness, shortness of breath, wheezing and stridor.   Cardiovascular: Negative for chest pain, palpitations and leg swelling.  Gastrointestinal: Negative for abdominal distention.  Genitourinary: Negative for dysuria, urgency, frequency, hematuria, flank pain, decreased urine volume, difficulty urinating and dyspareunia.  Musculoskeletal: Negative for back pain, joint swelling, arthralgias and gait problem.  Neurological: Negative for dizziness, tremors, seizures, syncope, facial asymmetry, speech difficulty, weakness, light-headedness, numbness and headaches.  Hematological: Negative for adenopathy. Does not bruise/bleed easily.  Psychiatric/Behavioral: Negative for hallucinations, behavioral problems, confusion, dysphoric mood, decreased concentration and agitation.    Objective:   Filed Vitals:   08/11/13 1618  BP: 117/75  Pulse: 79  Temp: 98.8 F (37.1 C)  Resp: 14    Physical Exam  Constitutional: Appears well-developed and well-nourished. No distress.  HENT: Normocephalic. External right and left ear normal. Oropharynx is clear and moist.  Eyes: Conjunctivae and EOM are normal. PERRLA, no scleral icterus.  Neck: Normal ROM. Neck supple. No JVD. No tracheal deviation. No thyromegaly.  CVS: RRR, S1/S2 +, no murmurs, no gallops, no carotid  bruit.  Pulmonary: Effort and breath sounds normal, no stridor, rhonchi, wheezes, rales.  Abdominal: Soft. BS +,  no distension, tenderness, rebound or guarding.  Musculoskeletal: Normal range of motion. No edema and no tenderness.  Lymphadenopathy: No lymphadenopathy noted, cervical, inguinal. Neuro:  Alert. Normal reflexes, muscle tone coordination. No cranial nerve deficit. Skin: Skin is warm and dry. No rash noted. Not diaphoretic. No erythema. No pallor.  Psychiatric: Normal mood and affect. Behavior, judgment, thought content normal.   Lab Results  Component Value Date   WBC 8.9 02/06/2013   HGB 15.3 02/06/2013   HCT 44.4 02/06/2013   MCV 91.5 02/06/2013   PLT 136* 02/06/2013   Lab Results  Component Value Date   CREATININE 1.07 02/06/2013   BUN 13 02/06/2013   NA 138 02/06/2013   K 3.7 02/06/2013   CL 103 02/06/2013   CO2 26 02/06/2013    Lab Results  Component Value Date   HGBA1C 5.5 08/11/2013   Lipid Panel     Component Value Date/Time   CHOL 135 08/31/2009 1937   TRIG 111 08/31/2009 1937   HDL 41 08/31/2009 1937   CHOLHDL 3.3 Ratio 08/31/2009 1937   VLDL 22 08/31/2009 1937   LDLCALC 72 08/31/2009 1937       Assessment and plan:   Patient Active Problem List   Diagnosis Date Noted  . 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 The patient verbalized understanding. The patient was told to call to get any lab results if not heard anything in the next week.

## 2013-08-12 ENCOUNTER — Telehealth: Payer: Self-pay | Admitting: *Deleted

## 2013-08-12 LAB — URINALYSIS
Bilirubin Urine: NEGATIVE
Glucose, UA: NEGATIVE mg/dL
Protein, ur: NEGATIVE mg/dL
pH: 6 (ref 5.0–8.0)

## 2013-08-12 NOTE — Telephone Encounter (Signed)
Informed pt of scheduled Echocardiogram and MR. Pt answered the phone and acknowledged what was being instructed to him. Call completed perfectly.

## 2013-08-18 ENCOUNTER — Ambulatory Visit (HOSPITAL_COMMUNITY)
Admission: RE | Admit: 2013-08-18 | Discharge: 2013-08-18 | Disposition: A | Discharge status: Home / Self Care | Payer: No Typology Code available for payment source | Source: Ambulatory Visit | Attending: Internal Medicine | Admitting: Internal Medicine

## 2013-08-18 ENCOUNTER — Ambulatory Visit: Payer: No Typology Code available for payment source | Admitting: Cardiology

## 2013-08-18 DIAGNOSIS — R42 Dizziness and giddiness: Secondary | ICD-10-CM | POA: Insufficient documentation

## 2013-08-18 DIAGNOSIS — I2510 Atherosclerotic heart disease of native coronary artery without angina pectoris: Secondary | ICD-10-CM | POA: Insufficient documentation

## 2013-08-18 DIAGNOSIS — E119 Type 2 diabetes mellitus without complications: Secondary | ICD-10-CM

## 2013-08-18 DIAGNOSIS — E785 Hyperlipidemia, unspecified: Secondary | ICD-10-CM | POA: Insufficient documentation

## 2013-08-18 NOTE — Progress Notes (Signed)
  Echocardiogram 2D Echocardiogram has been performed.  Georgian Co 08/18/2013, 9:42 AM

## 2013-08-23 ENCOUNTER — Ambulatory Visit (HOSPITAL_COMMUNITY)
Admission: RE | Admit: 2013-08-23 | Discharge: 2013-08-23 | Disposition: A | Discharge status: Home / Self Care | Payer: No Typology Code available for payment source | Source: Ambulatory Visit | Attending: Internal Medicine | Admitting: Internal Medicine

## 2013-08-23 ENCOUNTER — Ambulatory Visit (HOSPITAL_COMMUNITY): Payer: No Typology Code available for payment source

## 2013-08-23 DIAGNOSIS — H5460 Unqualified visual loss, one eye, unspecified: Secondary | ICD-10-CM | POA: Insufficient documentation

## 2013-08-23 DIAGNOSIS — E119 Type 2 diabetes mellitus without complications: Secondary | ICD-10-CM | POA: Insufficient documentation

## 2013-08-23 MED ORDER — GADOBENATE DIMEGLUMINE 529 MG/ML IV SOLN
20.0000 mL | Freq: Once | INTRAVENOUS | Status: AC | PRN
  Administered 2013-08-23: 19 mL via INTRAVENOUS

## 2013-08-25 ENCOUNTER — Ambulatory Visit: Payer: No Typology Code available for payment source | Attending: Cardiology | Admitting: Cardiology

## 2013-08-25 ENCOUNTER — Encounter: Payer: Self-pay | Admitting: Cardiology

## 2013-08-25 VITALS — BP 126/84 | HR 73 | Temp 98.2°F | Resp 16 | Ht 65.0 in | Wt 200.0 lb

## 2013-08-25 DIAGNOSIS — Z9861 Coronary angioplasty status: Secondary | ICD-10-CM | POA: Insufficient documentation

## 2013-08-25 DIAGNOSIS — Z09 Encounter for follow-up examination after completed treatment for conditions other than malignant neoplasm: Secondary | ICD-10-CM | POA: Insufficient documentation

## 2013-08-25 DIAGNOSIS — I2510 Atherosclerotic heart disease of native coronary artery without angina pectoris: Secondary | ICD-10-CM | POA: Insufficient documentation

## 2013-08-25 DIAGNOSIS — E785 Hyperlipidemia, unspecified: Secondary | ICD-10-CM | POA: Insufficient documentation

## 2013-08-25 DIAGNOSIS — E669 Obesity, unspecified: Secondary | ICD-10-CM | POA: Insufficient documentation

## 2013-08-25 DIAGNOSIS — E782 Mixed hyperlipidemia: Secondary | ICD-10-CM

## 2013-08-25 DIAGNOSIS — Z23 Encounter for immunization: Secondary | ICD-10-CM

## 2013-08-25 DIAGNOSIS — I25811 Atherosclerosis of native coronary artery of transplanted heart without angina pectoris: Secondary | ICD-10-CM

## 2013-08-25 DIAGNOSIS — Z720 Tobacco use: Secondary | ICD-10-CM

## 2013-08-25 DIAGNOSIS — R42 Dizziness and giddiness: Secondary | ICD-10-CM

## 2013-08-25 DIAGNOSIS — F17200 Nicotine dependence, unspecified, uncomplicated: Secondary | ICD-10-CM

## 2013-08-25 NOTE — Assessment & Plan Note (Signed)
Encouraged to quit. 

## 2013-08-25 NOTE — Progress Notes (Signed)
Pt here f/u Angioplasty x2 stents 2009  Request  Stress test Denies CP or SOB Working on stop smoking Taking prescribed medication daily Need flu vaccine

## 2013-08-25 NOTE — Assessment & Plan Note (Signed)
This most likely is a residual of loss of hearing in the right ear years ago. MRI MRA negative. It does not sound like in her ear. I will not refer to ENT.

## 2013-08-25 NOTE — Progress Notes (Signed)
HPI Samuel Lawson is a 67 year old male referred by Dr.Abrol because of a history of angioplasty and stenting of a totally occluded right coronary artery in 2009. At that time he had normal left ventricular function, nonobstructive disease of the LAD and RCA. He has been on aspirin and Plavix with no recurrent cardiac events. He was seen in emergency room in May of this year with chest pain. Troponins were negative as was chest x-ray and EKG. 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  He has vague symptoms of disequilibrium when he first stands up or other times happened spontaneously while walking. He feels like his head is on a spring and bobbles back and forth. He denies any vertigo. 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 He was told nothing could be done.  He denies any other focal neurological symptoms. MRI and MRA of the neck and brain were unremarkable on Dec 15, 2 days ago. He is having no symptoms of angina or coronary ischemia. He continues to smoke.  Past Medical History  Diagnosis Date  . Hypertension   . Hyperlipidemia     Current Outpatient Prescriptions  Medication Sig Dispense Refill  . aspirin 325 MG EC tablet Take 1 tablet (325 mg total) by mouth daily.  90 tablet  2  . atorvastatin (LIPITOR) 40 MG tablet Take 1 tablet (40 mg total) by mouth at bedtime.  60 tablet  4  . carvedilol (COREG) 6.25 MG tablet Take 1 tablet (6.25 mg total) by mouth 2 (two) times daily with a meal.  60 tablet  3  . clopidogrel (PLAVIX) 75 MG tablet Take 1 tablet (75 mg total) by mouth daily.  30 tablet  3  . famotidine (PEPCID) 20 MG tablet Take 1 tablet (20 mg total) by mouth 2 (two) times daily.  60 tablet  2  . fish oil-omega-3 fatty acids 1000 MG capsule Take 2 g by mouth daily.      . nitroGLYCERIN (NITROSTAT) 0.4 MG SL tablet Place 0.4 mg under the tongue every 5 (five) minutes as needed for chest pain.      . nicotine (NICODERM CQ - DOSED IN MG/24 HOURS) 21 mg/24hr patch  Place 1 patch (21 mg total) onto the skin daily.  28 patch  0  . [DISCONTINUED] promethazine (PHENERGAN) 25 MG tablet Take 1 tablet (25 mg total) by mouth every 6 (six) hours as needed for nausea.  20 tablet  0   No current facility-administered medications for this visit.    No Known Allergies  Family History  Problem Relation Age of Onset  . Stroke Father     History   Social History  . Marital Status: Single    Spouse Name: N/A    Number of Children: N/A  . Years of Education: N/A   Occupational History  . Not on file.   Social History Main Topics  . Smoking status: Current Every Day Smoker -- 1.00 packs/day  . Smokeless tobacco: Not on file     Comment: states new years resolution is to quit smoking  . Alcohol Use: No  . Drug Use: No  . Sexual Activity: Not on file   Other Topics Concern  . Not on file   Social History Narrative  . No narrative on file    ROS ALL NEGATIVE EXCEPT THOSE NOTED IN HPI  PE  General Appearance: well developed, well nourished in no acute distress, obese HEENT: symmetrical face, PERRLA, good  dentition  Neck: no JVD, thyromegaly, or adenopathy, trachea midline Chest: symmetric without deformity Cardiac: PMI non-displaced, RRR, normal S1, S2, no gallop or murmur Lung: clear to ausculation and percussion Vascular: all pulses full without bruits  Abdominal: nondistended, nontender, good bowel sounds, no HSM, no bruits Extremities: no cyanosis, clubbing or edema, no sign of DVT, no varicosities  Skin: normal color, no rashes Neuro: alert and oriented x 3, non-focal Pysch: normal affect  EKG Reviewed last EKG BMET    Component Value Date/Time   NA 141 08/11/2013 1715   K 3.9 08/11/2013 1715   CL 106 08/11/2013 1715   CO2 27 08/11/2013 1715   GLUCOSE 87 08/11/2013 1715   BUN 14 08/11/2013 1715   CREATININE 0.92 08/11/2013 1715   CREATININE 1.07 02/06/2013 1847   CALCIUM 9.9 08/11/2013 1715   GFRNONAA 71* 02/06/2013 1847   GFRAA 83*  02/06/2013 1847    Lipid Panel     Component Value Date/Time   CHOL 209* 08/11/2013 1715   TRIG 372* 08/11/2013 1715   HDL 47 08/11/2013 1715   CHOLHDL 4.4 08/11/2013 1715   VLDL 74* 08/11/2013 1715   LDLCALC 88 08/11/2013 1715    CBC    Component Value Date/Time   WBC 8.9 02/06/2013 1847   RBC 4.85 02/06/2013 1847   HGB 15.3 02/06/2013 1847   HCT 44.4 02/06/2013 1847   PLT 136* 02/06/2013 1847   MCV 91.5 02/06/2013 1847   MCH 31.5 02/06/2013 1847   MCHC 34.5 02/06/2013 1847   RDW 13.1 02/06/2013 1847   LYMPHSABS 1.1 02/06/2013 1847   MONOABS 0.4 02/06/2013 1847   EOSABS 0.1 02/06/2013 1847   BASOSABS 0.0 02/06/2013 1847

## 2013-12-24 ENCOUNTER — Encounter (HOSPITAL_COMMUNITY): Payer: Self-pay | Admitting: Emergency Medicine

## 2013-12-24 ENCOUNTER — Emergency Department (HOSPITAL_COMMUNITY)
Admission: EM | Admit: 2013-12-24 | Discharge: 2013-12-24 | Disposition: A | Discharge status: Home / Self Care | Payer: Medicare Other | Attending: Emergency Medicine | Admitting: Emergency Medicine

## 2013-12-24 ENCOUNTER — Telehealth: Payer: Self-pay | Admitting: Internal Medicine

## 2013-12-24 DIAGNOSIS — I10 Essential (primary) hypertension: Secondary | ICD-10-CM | POA: Diagnosis not present

## 2013-12-24 DIAGNOSIS — Z7901 Long term (current) use of anticoagulants: Secondary | ICD-10-CM | POA: Diagnosis not present

## 2013-12-24 DIAGNOSIS — Z9861 Coronary angioplasty status: Secondary | ICD-10-CM | POA: Diagnosis not present

## 2013-12-24 DIAGNOSIS — I252 Old myocardial infarction: Secondary | ICD-10-CM | POA: Insufficient documentation

## 2013-12-24 DIAGNOSIS — E785 Hyperlipidemia, unspecified: Secondary | ICD-10-CM | POA: Diagnosis not present

## 2013-12-24 DIAGNOSIS — R42 Dizziness and giddiness: Secondary | ICD-10-CM

## 2013-12-24 DIAGNOSIS — R404 Transient alteration of awareness: Secondary | ICD-10-CM | POA: Diagnosis not present

## 2013-12-24 DIAGNOSIS — Z79899 Other long term (current) drug therapy: Secondary | ICD-10-CM | POA: Diagnosis not present

## 2013-12-24 DIAGNOSIS — F17200 Nicotine dependence, unspecified, uncomplicated: Secondary | ICD-10-CM | POA: Diagnosis not present

## 2013-12-24 DIAGNOSIS — R51 Headache: Secondary | ICD-10-CM | POA: Diagnosis not present

## 2013-12-24 DIAGNOSIS — H538 Other visual disturbances: Secondary | ICD-10-CM | POA: Insufficient documentation

## 2013-12-24 HISTORY — DX: Acute myocardial infarction, unspecified: I21.9

## 2013-12-24 MED ORDER — LORAZEPAM 1 MG PO TABS: 1.0000 mg | ORAL_TABLET | Freq: Three times a day (TID) | ORAL | Status: DC | PRN

## 2013-12-24 NOTE — ED Provider Notes (Signed)
CSN: [id number]     Arrival date & time 12/24/13  1351 History   First MD Initiated Contact with Patient 12/24/13 1502     Chief Complaint  Patient presents with  . Headache  . Near Syncope     (Consider location/radiation/quality/duration/timing/severity/associated sxs/prior Treatment) HPI  This is a 68 year old male with a history of nicotine dependence, status post angioplasty x2, hyperlipidemia, currently on aspirin and Plavix, who presents with symptoms of occasional blurry vision in the right eye especially when he wakes up in the morning, as well as dysequilibrium.  It apparently occurs about twice a month. The last for at estimated 30 minutes. It resolved spontaneously. No modifying factors are appreciated. Patient denies frank weakness numbness or tingling. His symptoms have been present for about a year now, and patient has been seen by his primary care physician as well as multiple specialists for these complaints. Multiple tests have been performed including MRA of both the head and neck. Patient denies chest pain shortness of breath abdominal pain nausea or vomiting.  Negative for frank dizziness or syncope. He only complains this equilibrium as well as blurry vision.  Past Medical History  Diagnosis Date  . Hypertension   . Hyperlipidemia   . 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 Angioplasty    . Cardiac catheterization     Family History  Problem Relation Age of Onset  . Stroke Father    History  Substance Use Topics  . Smoking status: Current Every Day Smoker -- 1.00 packs/day    Types: Cigarettes  . Smokeless tobacco: Not on file     Comment: states new years resolution is to quit smoking  . Alcohol Use: No    Review of Systems  Constitutional: Negative for fever and chills.  HENT: Negative for facial swelling.   Eyes: Negative for pain and visual disturbance.  Respiratory: Negative for chest tightness and  shortness of breath.   Cardiovascular: Negative for chest pain.  Gastrointestinal: Negative for nausea and vomiting.  Genitourinary: Negative for dysuria.  Musculoskeletal: Negative for arthralgias and myalgias.  Neurological: Positive for light-headedness. Negative for headaches.  Psychiatric/Behavioral: Negative for behavioral problems.      Allergies  Review of patient's allergies indicates no known allergies.  Home Medications   Prior to Admission medications   Medication Sig Start Date End Date Taking? Authorizing Provider  aspirin 325 MG EC tablet Take 1 tablet (325 mg total) by mouth daily. 08/11/13  Yes Reyne Dumas, MD  atorvastatin (LIPITOR) 40 MG tablet Take 1 tablet (40 mg total) by mouth at bedtime. 08/11/13  Yes Reyne Dumas, MD  carvedilol (COREG) 6.25 MG tablet Take 1 tablet (6.25 mg total) by mouth 2 (two) times daily with a meal. 08/11/13  Yes Reyne Dumas, MD  clopidogrel (PLAVIX) 75 MG tablet Take 1 tablet (75 mg total) by mouth daily. 08/11/13  Yes Reyne Dumas, MD  Coenzyme Q10 (CO Q 10 PO) Take 0.5 tablets by mouth 3 (three) times a week. No specific days   Yes Historical Provider, MD  famotidine (PEPCID) 20 MG tablet Take 1 tablet (20 mg total) by mouth 2 (two) times daily. 08/11/13  Yes Reyne Dumas, MD  fish oil-omega-3 fatty acids 1000 MG capsule Take 1 g by mouth 2 (two) times daily.    Yes Historical Provider, MD   BP 118/67  Pulse 55  Temp(Src) 98.4 F (36.9 C) (Oral)  Resp 10  Ht 5\' 7"  (1.702 m)  Wt 206 lb (93.441 kg)  BMI 32.26 kg/m2  SpO2 93% Physical Exam  Constitutional: He is oriented to person, place, and time. He appears well-developed and well-nourished. No distress.  HENT:  Head: Normocephalic and atraumatic.  Mouth/Throat: No oropharyngeal exudate.  Eyes: Conjunctivae are normal. Pupils are equal, round, and reactive to light. No scleral icterus.  Neck: Normal range of motion. No tracheal deviation present. No thyromegaly present.   Cardiovascular: Normal rate, regular rhythm and normal heart sounds.  Exam reveals no gallop and no friction rub.   No murmur heard. Pulmonary/Chest: Effort normal and breath sounds normal. No stridor. No respiratory distress. He has no wheezes. He has no rales. He exhibits no tenderness.  Abdominal: Soft. He exhibits no distension and no mass. There is no tenderness. There is no rebound and no guarding.  Musculoskeletal: Normal range of motion. He exhibits no edema.  Neurological: He is alert and oriented to person, place, and time. He has normal strength. He displays no atrophy and no tremor. No cranial nerve deficit or sensory deficit. He exhibits normal muscle tone. He displays a negative Romberg sign. He displays no seizure activity. Coordination and gait normal. GCS eye subscore is 4. GCS verbal subscore is 5. GCS motor subscore is 6.  Reflex Scores:      Patellar reflexes are 2+ on the right side and 2+ on the left side. Head and pulse, test of skew within normal limits. There is no nystagmus on exam.  Coordination is within normal limits.  Skin: Skin is warm and dry. He is not diaphoretic.    ED Course  Procedures (including critical care time) Labs Review Labs Reviewed - No data to display  Imaging Review No results found.   EKG Interpretation None      MDM   Final diagnoses:  None    This is a 67 year old male with a history of nicotine dependence, status post angioplasty x2, hyperlipidemia, currently on aspirin and Plavix, who presents with symptoms of occasional blurry vision in the right eye especially when he wakes up in the morning, as well as dysequilibrium.  It apparently occurs about twice a month. The last for at estimated 30 minutes. It resolved spontaneously. No modifying factors are appreciated. Patient denies frank weakness numbness or tingling. His symptoms have been present for about a year now, and patient has been seen by his primary care physician as well  as multiple specialists for these complaints. Multiple tests have been performed including MRA of both the head and neck. Patient denies chest pain shortness of breath abdominal pain nausea or vomiting.  Negative for frank dizziness or syncope. He only complains this equilibrium as well as blurry vision.  Neurologic exam is within normal limits. Patient has no cranial nerve deficit, sensory deficit deficit, motor deficit, deficit in coordination or gait. Romberg negative. Head and pulse and test of skew are within normal limits. Patient has no nystagmus.   At this time, I doubt that this presentation represents a stroke, intoxication, withdrawal, infection, acidosis, encephalopathy, spinal pathology, embolus, meningitis or dissection.  Patient has been thoroughly worked up by primary care physician as well as specialists for this the disc equilibrium. MR a of the head and neck were within normal limits about 4 months ago. Symptoms have not progressed since then, only persistent.  Pt stable for discharge with rx for ativan PRN, FU with neurology.  All questions answered.  Return precautions given.  I have  discussed case and care has been guided by my attending physician, Dr. Vanita Panda.  Doy Hutching, MD 12/25/13 [phone]

## 2013-12-24 NOTE — ED Notes (Signed)
Vital signs stable. 

## 2013-12-24 NOTE — Telephone Encounter (Signed)
Pt came in this afternoon visibly pale and unable to stand; pt was immediately sent to the ER per PCP; pt has been asked to f/u with Neurology but needs a referral; please f/u with pt about his referral and if he needs to come in for a visit;

## 2013-12-24 NOTE — Discharge Instructions (Signed)

## 2013-12-24 NOTE — ED Notes (Signed)
Dr.Lockwood at bedside  

## 2013-12-24 NOTE — ED Notes (Signed)
Per EMS: Patient c/o dizziness and headache x 3 days, has had similar occurences in the past.  States he has pain in the back his neck on the right side, denies recent injury.  States vision is blurry at times, no history of migraines, no neural deficits noted

## 2013-12-25 NOTE — ED Provider Notes (Signed)
This patient was seen in conjunction with the resident physician, Dr. Jodi Mourning.  The documentation accurately reflects the patient's encounter in the Emergency Department.  On my exam, the patient was in no distress, w no e/o neuro deficits. He has had extensive outpatient eval.  With reassuring findings, he was provided new meds for episodes of discomfort, and d/c in stable condition.   Carmin Muskrat, MD 12/25/13 [phone]

## 2014-01-14 ENCOUNTER — Telehealth: Payer: Self-pay | Admitting: Internal Medicine

## 2014-01-14 DIAGNOSIS — E7800 Pure hypercholesterolemia, unspecified: Secondary | ICD-10-CM

## 2014-01-14 DIAGNOSIS — K219 Gastro-esophageal reflux disease without esophagitis: Secondary | ICD-10-CM

## 2014-01-14 DIAGNOSIS — I10 Essential (primary) hypertension: Secondary | ICD-10-CM

## 2014-01-14 NOTE — Telephone Encounter (Signed)
Pt. called requesting refills on all of his prescriptions.Marland KitchenMarland KitchenMarland KitchenPlease call patient.

## 2014-01-18 MED ORDER — FAMOTIDINE 20 MG PO TABS: 20.0000 mg | ORAL_TABLET | Freq: Two times a day (BID) | ORAL | Status: DC

## 2014-01-18 MED ORDER — ATORVASTATIN CALCIUM 40 MG PO TABS: 40.0000 mg | ORAL_TABLET | Freq: Every day | ORAL | Status: DC

## 2014-01-18 MED ORDER — ASPIRIN 325 MG PO TBEC: 325.0000 mg | DELAYED_RELEASE_TABLET | Freq: Every day | ORAL | Status: DC

## 2014-01-18 MED ORDER — CARVEDILOL 6.25 MG PO TABS: 6.2500 mg | ORAL_TABLET | Freq: Two times a day (BID) | ORAL | Status: DC

## 2014-01-18 MED ORDER — CLOPIDOGREL BISULFATE 75 MG PO TABS: 75.0000 mg | ORAL_TABLET | Freq: Every day | ORAL | Status: DC

## 2014-01-18 NOTE — Telephone Encounter (Signed)
I spoke to the pt and I told him that I would refill his medications but only enough medications to last him until his next appointment. I made him get another appointment today.

## 2014-02-17 DIAGNOSIS — H1130 Conjunctival hemorrhage, unspecified eye: Secondary | ICD-10-CM | POA: Diagnosis not present

## 2014-02-24 DIAGNOSIS — H109 Unspecified conjunctivitis: Secondary | ICD-10-CM | POA: Diagnosis not present

## 2014-02-24 DIAGNOSIS — H353190 Nonexudative age-related macular degeneration, unspecified eye, stage unspecified: Secondary | ICD-10-CM | POA: Diagnosis not present

## 2014-02-24 DIAGNOSIS — H1130 Conjunctival hemorrhage, unspecified eye: Secondary | ICD-10-CM | POA: Diagnosis not present

## 2014-03-01 NOTE — Progress Notes (Unsigned)
Patient came to the office today for blood pressure check Patient stated his blood pressure was high last evening Patient uses a wrist cuff at home  i explained when using this kind of blood pressure he needs to keep his wrist above his  Heart His blood pressure in office using his wrist cuff  Was 142/96 Patient will come back in three days for re check of his blood pressure

## 2014-03-09 ENCOUNTER — Ambulatory Visit: Payer: Medicare Other | Attending: Internal Medicine | Admitting: Internal Medicine

## 2014-03-09 VITALS — BP 132/80 | HR 63 | Temp 98.4°F | Wt 205.0 lb

## 2014-03-09 DIAGNOSIS — F17200 Nicotine dependence, unspecified, uncomplicated: Secondary | ICD-10-CM | POA: Insufficient documentation

## 2014-03-09 DIAGNOSIS — H6120 Impacted cerumen, unspecified ear: Secondary | ICD-10-CM | POA: Diagnosis not present

## 2014-03-09 DIAGNOSIS — H6121 Impacted cerumen, right ear: Secondary | ICD-10-CM

## 2014-03-09 DIAGNOSIS — H9210 Otorrhea, unspecified ear: Secondary | ICD-10-CM | POA: Insufficient documentation

## 2014-03-09 DIAGNOSIS — I10 Essential (primary) hypertension: Secondary | ICD-10-CM | POA: Insufficient documentation

## 2014-03-09 DIAGNOSIS — E7800 Pure hypercholesterolemia, unspecified: Secondary | ICD-10-CM

## 2014-03-09 DIAGNOSIS — E785 Hyperlipidemia, unspecified: Secondary | ICD-10-CM | POA: Diagnosis not present

## 2014-03-09 DIAGNOSIS — K219 Gastro-esophageal reflux disease without esophagitis: Secondary | ICD-10-CM | POA: Diagnosis not present

## 2014-03-09 DIAGNOSIS — E782 Mixed hyperlipidemia: Secondary | ICD-10-CM | POA: Diagnosis not present

## 2014-03-09 DIAGNOSIS — E878 Other disorders of electrolyte and fluid balance, not elsewhere classified: Secondary | ICD-10-CM | POA: Diagnosis not present

## 2014-03-09 DIAGNOSIS — R42 Dizziness and giddiness: Secondary | ICD-10-CM | POA: Insufficient documentation

## 2014-03-09 DIAGNOSIS — Z720 Tobacco use: Secondary | ICD-10-CM

## 2014-03-09 LAB — COMPLETE METABOLIC PANEL WITH GFR
ALBUMIN: 4.2 g/dL (ref 3.5–5.2)
ALT: 9 U/L (ref 0–53)
AST: 13 U/L (ref 0–37)
Alkaline Phosphatase: 63 U/L (ref 39–117)
BUN: 15 mg/dL (ref 6–23)
CALCIUM: 9.3 mg/dL (ref 8.4–10.5)
CHLORIDE: 107 meq/L (ref 96–112)
CO2: 26 mEq/L (ref 19–32)
CREATININE: 0.98 mg/dL (ref 0.50–1.35)
GFR, EST NON AFRICAN AMERICAN: 81 mL/min
GLUCOSE: 89 mg/dL (ref 70–99)
POTASSIUM: 4 meq/L (ref 3.5–5.3)
Sodium: 141 mEq/L (ref 135–145)
Total Bilirubin: 1 mg/dL (ref 0.2–1.2)
Total Protein: 6.6 g/dL (ref 6.0–8.3)

## 2014-03-09 LAB — LIPID PANEL
CHOLESTEROL: 131 mg/dL (ref 0–200)
HDL: 42 mg/dL (ref >39)
LDL Cholesterol: 55 mg/dL (ref 0–99)
TRIGLYCERIDES: 170 mg/dL — AB (ref <150)
Total CHOL/HDL Ratio: 3.1 Ratio
VLDL: 34 mg/dL (ref 0–40)

## 2014-03-09 MED ORDER — CLOPIDOGREL BISULFATE 75 MG PO TABS: 75.0000 mg | ORAL_TABLET | Freq: Every day | ORAL | Status: DC

## 2014-03-09 MED ORDER — FAMOTIDINE 20 MG PO TABS: 20.0000 mg | ORAL_TABLET | Freq: Two times a day (BID) | ORAL | Status: DC

## 2014-03-09 MED ORDER — ASPIRIN 325 MG PO TBEC: 325.0000 mg | DELAYED_RELEASE_TABLET | Freq: Every day | ORAL | Status: DC

## 2014-03-09 MED ORDER — CARBAMIDE PEROXIDE 6.5 % OT SOLN: 5.0000 [drp] | Freq: Two times a day (BID) | OTIC | Status: DC

## 2014-03-09 MED ORDER — MECLIZINE HCL 25 MG PO TABS: 25.0000 mg | ORAL_TABLET | Freq: Three times a day (TID) | ORAL | Status: DC | PRN

## 2014-03-09 MED ORDER — CARVEDILOL 6.25 MG PO TABS: 6.2500 mg | ORAL_TABLET | Freq: Two times a day (BID) | ORAL | Status: DC

## 2014-03-09 MED ORDER — ATORVASTATIN CALCIUM 40 MG PO TABS: 40.0000 mg | ORAL_TABLET | Freq: Every day | ORAL | Status: DC

## 2014-03-09 NOTE — Progress Notes (Signed)
Patient states he's not stable when standing,has severe dizziness that he needs to hold on to something to avoid falling.He also complains of left ear drainage,denies pain.gp

## 2014-03-09 NOTE — Progress Notes (Signed)
MRN: [id number] Name: Samuel Lawson  Sex: male Age: 68 y.o. DOB: 1946/09/08  Allergies: Review of patient's allergies indicates no known allergies.  Chief Complaint  Patient presents with  . Dizziness    when standing  . Ear Drainage    left    HPI: Patient is 68 y.o. male who has to of CAD hyperlipidemia, hypertension, chronic disequilibrium, patient reported to have right ear  problem for long time patient has followed up with his cardiologist, as per patient when he wakes up and walk after sometime he feels dizzy, patient has history of carotid artery stenosis as well as in the past she has been referred to neurology but never made an appointment he had MRI MRA done which was negative, patient is compliant in taking his medications requesting refill on his medications denies any fever chills numbness weakness chest and shortness of breath.  Past Medical History  Diagnosis Date  . Hypertension   . Hyperlipidemia   . Acute MI 2009    STENTS PLACED    Past Surgical History  Procedure Laterality Date  . Angioplasty    . Cardiac catheterization        Medication List       This list is accurate as of: 03/09/14 12:46 PM.  Always use your most recent med list.               aspirin 325 MG EC tablet  Take 1 tablet (325 mg total) by mouth daily.     atorvastatin 40 MG tablet  Commonly known as:  LIPITOR  Take 1 tablet (40 mg total) by mouth at bedtime.     carbamide peroxide 6.5 % otic solution  Commonly known as:  DEBROX  Place 5 drops into the right ear 2 (two) times daily.     carvedilol 6.25 MG tablet  Commonly known as:  COREG  Take 1 tablet (6.25 mg total) by mouth 2 (two) times daily with a meal.     clopidogrel 75 MG tablet  Commonly known as:  PLAVIX  Take 1 tablet (75 mg total) by mouth daily.     CO Q 10 PO  Take 0.5 tablets by mouth 3 (three) times a week. No specific days     famotidine 20 MG tablet  Commonly known as:  PEPCID  Take 1  tablet (20 mg total) by mouth 2 (two) times daily.     fish oil-omega-3 fatty acids 1000 MG capsule  Take 1 g by mouth 2 (two) times daily.     LORazepam 1 MG tablet  Commonly known as:  ATIVAN  Take 1 tablet (1 mg total) by mouth 3 (three) times daily as needed (Dizziness).     meclizine 25 MG tablet  Commonly known as:  ANTIVERT  Take 1 tablet (25 mg total) by mouth 3 (three) times daily as needed for nausea.        Meds ordered this encounter  Medications  . meclizine (ANTIVERT) 25 MG tablet    Sig: Take 1 tablet (25 mg total) by mouth 3 (three) times daily as needed for nausea.    Dispense:  30 tablet    Refill:  1  . carbamide peroxide (DEBROX) 6.5 % otic solution    Sig: Place 5 drops into the right ear 2 (two) times daily.    Dispense:  15 mL    Refill:  1  . aspirin 325 MG EC tablet    Sig: Take 1  tablet (325 mg total) by mouth daily.    Dispense:  90 tablet    Refill:  2  . atorvastatin (LIPITOR) 40 MG tablet    Sig: Take 1 tablet (40 mg total) by mouth at bedtime.    Dispense:  60 tablet    Refill:  2  . carvedilol (COREG) 6.25 MG tablet    Sig: Take 1 tablet (6.25 mg total) by mouth 2 (two) times daily with a meal.    Dispense:  60 tablet    Refill:  2  . clopidogrel (PLAVIX) 75 MG tablet    Sig: Take 1 tablet (75 mg total) by mouth daily.    Dispense:  30 tablet    Refill:  2  . famotidine (PEPCID) 20 MG tablet    Sig: Take 1 tablet (20 mg total) by mouth 2 (two) times daily.    Dispense:  60 tablet    Refill:  2    Immunization History  Administered Date(s) Administered  . Influenza,inj,Quad PF,36+ Mos 08/25/2013    Family History  Problem Relation Age of Onset  . Stroke Father     History  Substance Use Topics  . Smoking status: Current Every Day Smoker -- 1.00 packs/day    Types: Cigarettes  . Smokeless tobacco: Not on file     Comment: states new years resolution is to quit smoking  . Alcohol Use: No    Review of Systems   As noted  in HPI  Filed Vitals:   03/09/14 1150  BP: 132/80  Pulse: 63  Temp:     Physical Exam  Physical Exam  HENT:  Increased wax in right ear   Eyes: EOM are normal. Pupils are equal, round, and reactive to light.  Cardiovascular: Normal rate and regular rhythm.   Pulmonary/Chest: Breath sounds normal. No respiratory distress. He has no wheezes. He has no rales.  Musculoskeletal:  Equal strength all extremities    CBC    Component Value Date/Time   WBC 8.9 02/06/2013 1847   RBC 4.85 02/06/2013 1847   HGB 15.3 02/06/2013 1847   HCT 44.4 02/06/2013 1847   PLT 136* 02/06/2013 1847   MCV 91.5 02/06/2013 1847   LYMPHSABS 1.1 02/06/2013 1847   MONOABS 0.4 02/06/2013 1847   EOSABS 0.1 02/06/2013 1847   BASOSABS 0.0 02/06/2013 1847    CMP     Component Value Date/Time   NA 141 08/11/2013 1715   K 3.9 08/11/2013 1715   CL 106 08/11/2013 1715   CO2 27 08/11/2013 1715   GLUCOSE 87 08/11/2013 1715   BUN 14 08/11/2013 1715   CREATININE 0.92 08/11/2013 1715   CREATININE 1.07 02/06/2013 1847   CALCIUM 9.9 08/11/2013 1715   PROT 7.0 08/11/2013 1715   ALBUMIN 4.5 08/11/2013 1715   AST 20 08/11/2013 1715   ALT 21 08/11/2013 1715   ALKPHOS 64 08/11/2013 1715   BILITOT 0.6 08/11/2013 1715   GFRNONAA 87 08/11/2013 1715   GFRNONAA 71* 02/06/2013 1847   GFRAA >89 08/11/2013 1715   GFRAA 83* 02/06/2013 1847    Lab Results  Component Value Date/Time   CHOL 209* 08/11/2013  5:15 PM    No components found with this basename: hga1c    Lab Results  Component Value Date/Time   AST 20 08/11/2013  5:15 PM    Assessment and Plan  Essential hypertension - Plan: Will repeat blood chemistry COMPLETE METABOLIC PANEL WITH GFR, carvedilol (COREG) 6.25 MG tablet, clopidogrel (PLAVIX) 75 MG  tablet  Mixed hyperlipidemia - Plan: Will repeat Lipid panel, continue with Lipitor 40 mg daily.   Excess ear wax, right - Plan: Ear wax removal, right ear irrigation done today minimal wax removed still has persistent wax,  Ambulatory referral to ENT,  patient was also try carbamide peroxide (DEBROX) 6.5 % otic solution  Disequilibrium - Plan: Ambulatory referral to Neurology, Ambulatory referral to ENT  Dizziness and giddiness - Plan: trial of  meclizine (ANTIVERT) 25 MG tablet, Ambulatory referral to ENT  Gastroesophageal reflux disease without esophagitis - Plan:famotidine (PEPCID) 20 MG tablet   Return in about 3 months (around 06/09/2014) for hypertension.  Lorayne Marek, MD

## 2014-03-10 ENCOUNTER — Telehealth: Payer: Self-pay

## 2014-03-10 NOTE — Telephone Encounter (Signed)
Patient is aware of his lab results 

## 2014-03-10 NOTE — Telephone Encounter (Signed)
Message copied by Dorothe Pea on Thu Mar 10, 2014 11:17 AM ------      Message from: Lorayne Marek      Created: Thu Mar 10, 2014 11:08 AM       Call and let  the patient know that his blood work shows improvement in his triglycerides, continue with his current meds and low-fat diet. ------

## 2014-03-14 ENCOUNTER — Ambulatory Visit (INDEPENDENT_AMBULATORY_CARE_PROVIDER_SITE_OTHER): Payer: Medicare Other | Admitting: Neurology

## 2014-03-14 ENCOUNTER — Encounter: Payer: Self-pay | Admitting: Neurology

## 2014-03-14 VITALS — Ht 67.0 in | Wt 204.0 lb

## 2014-03-14 DIAGNOSIS — E669 Obesity, unspecified: Secondary | ICD-10-CM | POA: Diagnosis not present

## 2014-03-14 DIAGNOSIS — H109 Unspecified conjunctivitis: Secondary | ICD-10-CM | POA: Diagnosis not present

## 2014-03-14 DIAGNOSIS — E785 Hyperlipidemia, unspecified: Secondary | ICD-10-CM | POA: Diagnosis not present

## 2014-03-14 DIAGNOSIS — E782 Mixed hyperlipidemia: Secondary | ICD-10-CM

## 2014-03-14 DIAGNOSIS — H1130 Conjunctival hemorrhage, unspecified eye: Secondary | ICD-10-CM | POA: Diagnosis not present

## 2014-03-14 DIAGNOSIS — R7301 Impaired fasting glucose: Secondary | ICD-10-CM | POA: Diagnosis not present

## 2014-03-14 NOTE — Progress Notes (Signed)
PATIENT: Samuel Lawson DOB: 1946-02-15  HISTORICAL  Samuel Lawson is a 68 years old right-handed male, referred by his primary care physician Dr. Annitta Needs for evaluation of acute onset dizziness.  He has PMHx of obesity, HTN, CAD, stent in 2010, taking asa and plavix, he also has past medical history of hyperlipidemia, smoker.  He complains of gradual onset intermittent trouble with balance over past 2 years, since 2013, some days he is better like today, but some days he complains of unsteady gait, neck pain, with associated blurry vision, difficulty keeping his balance,  But he denies vertigo, he denies bilateral lower extremity paresthesia, he denies persistent weakness,  He also complains of episode of light headedness, improved by glucose intake.  He had MRI, in December 2014, which showed no acute brain finding. Mild chronic appearing small vessel change of the hemispheric white matter, frequently seen in asymptomatic individuals of this age.   Intracranial MR angiography shows no abnormality of the large or medium size vessels.  MR angiography of the neck vessels does not show any atherosclerotic narrowing or irregularity, with specific attention to the carotid bifurcation regions.  He complains of persistent neck pain, feels better when he stretches his neck, bilateral shoulder pain  REVIEW OF SYSTEMS: Full 14 system review of systems performed and notable only for weight gain, ringing ears, blood in stool, blurry vision, confusion, weakness, dizziness, decreased energy  ALLERGIES: No Known Allergies  HOME MEDICATIONS:   Current Outpatient Prescriptions on File Prior to Visit  Medication Sig Dispense Refill  . aspirin 325 MG EC tablet Take 1 tablet (325 mg total) by mouth daily.  90 tablet  2  . atorvastatin (LIPITOR) 40 MG tablet Take 1 tablet (40 mg total) by mouth at bedtime.  60 tablet  2  . carvedilol (COREG) 6.25 MG tablet Take 1 tablet (6.25 mg total) by mouth 2  (two) times daily with a meal.  60 tablet  2  . clopidogrel (PLAVIX) 75 MG tablet Take 1 tablet (75 mg total) by mouth daily.  30 tablet  2  . Coenzyme Q10 (CO Q 10 PO) Take 0.5 tablets by mouth 3 (three) times a week. No specific days      . famotidine (PEPCID) 20 MG tablet Take 1 tablet (20 mg total) by mouth 2 (two) times daily.  60 tablet  2  . fish oil-omega-3 fatty acids 1000 MG capsule Take 1 g by mouth 2 (two) times daily.       Marland Kitchen LORazepam (ATIVAN) 1 MG tablet Take 1 tablet (1 mg total) by mouth 3 (three) times daily as needed (Dizziness).  15 tablet  0  . [DISCONTINUED] promethazine (PHENERGAN) 25 MG tablet Take 1 tablet (25 mg total) by mouth every 6 (six) hours as needed for nausea.  20 tablet  0   No current facility-administered medications on file prior to visit.    PAST MEDICAL HISTORY: Past Medical History  Diagnosis Date  . Hypertension   . Hyperlipidemia   . Acute MI 2009    STENTS PLACED    PAST SURGICAL HISTORY: Past Surgical History  Procedure Laterality Date  . Angioplasty    . Cardiac catheterization      FAMILY HISTORY: Family History  Problem Relation Age of Onset  . Stroke Father   . Alzheimer's disease Father   . High blood pressure Father   . Heart Problems Mother     SOCIAL HISTORY:  History   Social History  .  Marital Status: Single    Spouse Name: N/A    Number of Children: 2  . Years of Education: B.S.   Occupational History    Computers - Works for [employer],    Social History Main Topics  . Smoking status: Current Every Day Smoker -- 1.00 packs/day    Types: Cigarettes  . Smokeless tobacco: Never Used     Comment: states new years resolution is to quit smoking  . Alcohol Use: No  . Drug Use: No  . Sexual Activity: Not on file   Other Topics Concern  . Not on file   Social History Narrative   Patient lives at home alone and he is single. Patient is self employed and works with computers.   Education B.S.   Right handed.     Caffeine one cup of tea daily and two pepsi daily.     PHYSICAL EXAM   Filed Vitals:   03/14/14 0917  Height: 5\' 7"  (1.702 m)  Weight: 204 lb (92.534 kg)    Not recorded    Body mass index is 31.94 kg/(m^2).   Generalized: In no acute distress  Neck: Supple, no carotid bruits   Cardiac: Regular rate rhythm  Pulmonary: Clear to auscultation bilaterally  Musculoskeletal: No deformity  Neurological examination  Mentation: Alert oriented to time, place, history taking, and causual conversation  Cranial nerve II-XII: Pupils were equal round reactive to light. Extraocular movements were full.  Visual field were full on confrontational test. Bilateral fundi were sharp.  Facial sensation and strength were normal. Hearing was intact to finger rubbing bilaterally. Uvula tongue midline.  Head turning and shoulder shrug and were normal and symmetric.Tongue protrusion into cheek strength was normal.  Motor: Normal tone, bulk and strength.  Sensory: Intact to fine touch, pinprick, preserved vibratory sensation, and proprioception at toes.  Coordination: Normal finger to nose, heel-to-shin bilaterally there was no truncal ataxia  Gait: Rising up from seated position without assistance, normal stance, without trunk ataxia, moderate stride, good arm swing, smooth turning, able to perform tiptoe, and heel walking with mild difficulty  Romberg signs: Negative  Deep tendon reflexes: Brachioradialis 2/2, biceps 2/2, triceps 2/2, patellar 2/2, Achilles 2/2, plantar responses were flexor bilaterally.   DIAGNOSTIC DATA (LABS, IMAGING, TESTING) - I reviewed patient records, labs, notes, testing and imaging myself where available.  Lab Results  Component Value Date   WBC 8.9 02/06/2013   HGB 15.3 02/06/2013   HCT 44.4 02/06/2013   MCV 91.5 02/06/2013   PLT 136* 02/06/2013      Component Value Date/Time   NA 141 03/09/2014 1247   K 4.0 03/09/2014 1247   CL 107 03/09/2014 1247   CO2 26  03/09/2014 1247   GLUCOSE 89 03/09/2014 1247   BUN 15 03/09/2014 1247   CREATININE 0.98 03/09/2014 1247   CREATININE 1.07 02/06/2013 1847   CALCIUM 9.3 03/09/2014 1247   PROT 6.6 03/09/2014 1247   ALBUMIN 4.2 03/09/2014 1247   AST 13 03/09/2014 1247   ALT 9 03/09/2014 1247   ALKPHOS 63 03/09/2014 1247   BILITOT 1.0 03/09/2014 1247   GFRNONAA 81 03/09/2014 1247   GFRNONAA 71* 02/06/2013 1847   GFRAA >89 03/09/2014 1247   GFRAA 83* 02/06/2013 1847   Lab Results  Component Value Date   CHOL 131 03/09/2014   HDL 42 03/09/2014   LDLCALC 55 03/09/2014   TRIG 170* 03/09/2014   CHOLHDL 3.1 03/09/2014   Lab Results  Component Value Date  HGBA1C 5.5 08/11/2013   No results found for this basename: [id number]   Lab Results  Component Value Date   TSH 1.281 08/11/2013    ASSESSMENT AND PLAN  Samuel Lawson is a 68 y.o. male with vascular risk factor of obesity, hypertension, hyperlipidemia, complains of unsteady gait, chronic neck pain, bilateral shoulder pain, on examination, he has central obesity, otherwise normal upper and lower extremity motor strength.  Differentiation diagnosis including deconditioning, will proceed with MRI of the cervical, to evaluate cervical degenerative disc disease Neck stretching exercise, will also check hemoglobin A1c Return to clinic in one month,     Krista Blue, M.D. Ph.D.  [facility] [address], [place], [place] [address] [phone]

## 2014-03-15 ENCOUNTER — Telehealth: Payer: Self-pay | Admitting: Neurology

## 2014-03-15 LAB — HGB A1C W/O EAG: Hgb A1c MFr Bld: 5.9 % — ABNORMAL HIGH (ref 4.8–5.6)

## 2014-03-15 NOTE — Telephone Encounter (Signed)
Please call patient, elevated a1C 5.9, make him at increased risk for DM. He should address the issue with his Primary care, he should exercise, watch diet, keep f/u appt.

## 2014-03-16 DIAGNOSIS — H7190 Unspecified cholesteatoma, unspecified ear: Secondary | ICD-10-CM | POA: Diagnosis not present

## 2014-03-16 DIAGNOSIS — H6120 Impacted cerumen, unspecified ear: Secondary | ICD-10-CM | POA: Diagnosis not present

## 2014-03-16 NOTE — Telephone Encounter (Signed)
Spoke to patient and relayed elevated A1c at 5.9 and having an increase risk for DM, per Dr. Krista Blue.  Explained that he should address with his PCP, exercise, watch diet and keep follow up.  Patient expressed understanding.

## 2014-03-23 ENCOUNTER — Ambulatory Visit
Admission: RE | Admit: 2014-03-23 | Discharge: 2014-03-23 | Disposition: A | Discharge status: Home / Self Care | Payer: Medicare Other | Source: Ambulatory Visit | Attending: Neurology | Admitting: Neurology

## 2014-03-23 DIAGNOSIS — E785 Hyperlipidemia, unspecified: Secondary | ICD-10-CM

## 2014-03-23 DIAGNOSIS — R269 Unspecified abnormalities of gait and mobility: Secondary | ICD-10-CM

## 2014-03-23 DIAGNOSIS — E669 Obesity, unspecified: Secondary | ICD-10-CM

## 2014-03-23 DIAGNOSIS — E782 Mixed hyperlipidemia: Secondary | ICD-10-CM

## 2014-03-23 DIAGNOSIS — M47812 Spondylosis without myelopathy or radiculopathy, cervical region: Secondary | ICD-10-CM | POA: Diagnosis not present

## 2014-03-25 ENCOUNTER — Telehealth: Payer: Self-pay | Admitting: Neurology

## 2014-03-25 NOTE — Telephone Encounter (Signed)
I have called him about MRI cervical.  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

## 2014-04-28 ENCOUNTER — Ambulatory Visit (INDEPENDENT_AMBULATORY_CARE_PROVIDER_SITE_OTHER): Payer: Medicare Other | Admitting: Nurse Practitioner

## 2014-04-28 ENCOUNTER — Encounter: Payer: Self-pay | Admitting: Nurse Practitioner

## 2014-04-28 VITALS — BP 120/71 | HR 58 | Ht 67.0 in | Wt 197.0 lb

## 2014-04-28 DIAGNOSIS — E669 Obesity, unspecified: Secondary | ICD-10-CM

## 2014-04-28 DIAGNOSIS — M542 Cervicalgia: Secondary | ICD-10-CM | POA: Diagnosis not present

## 2014-04-28 DIAGNOSIS — I10 Essential (primary) hypertension: Secondary | ICD-10-CM

## 2014-04-28 DIAGNOSIS — E785 Hyperlipidemia, unspecified: Secondary | ICD-10-CM

## 2014-04-28 DIAGNOSIS — R42 Dizziness and giddiness: Secondary | ICD-10-CM

## 2014-04-28 NOTE — Progress Notes (Signed)
[facility]  PATIENT: Samuel Lawson DOB: 1946-01-26   REASON FOR VISIT: Followup for dizziness    HISTORY OF PRESENT ILLNESS:[name], 68 year old male returns for followup. He was initially seen by Dr. Krista Blue 03/14/2014. His initial complaint was acute onset of dizziness and neck pain .Workup revealed slightly elevated hemoglobin A1c. He followed up with his primary care and was told  to cut out bread and Pepsi. He tells me prior to evaluation with Dr. Krista Blue he never ate  breakfast or lunch but had  a heavy meal at suppertime. He has loss about 7 pounds since then and his balance issues have improved. He denies any falls. He has  seen an ophthalmologist who told him  that he needs eyeglasses. He has not obtained those and continues to complain of blurred vision. MRI of the cervical spine showing mild spondylitic changes at C3-C4, C4-C5 resulting in mild right sided narrowing but no definite compression. He works at a computer all day. He is not doing neck exercises consistently. He returns for reevaluation  HISTORY: [name] is a 68 years old right-handed male, referred by his primary care physician Dr. Annitta Needs for evaluation of acute onset dizziness.  He has PMHx of obesity, HTN, CAD, stent in 2010, taking asa and plavix, he also has past medical history of hyperlipidemia, smoker.  He complains of gradual onset intermittent trouble with balance over past 2 years, since 2013, some days he is better like today, but some days he complains of unsteady gait, neck pain, with associated blurry vision, difficulty keeping his balance,  But he denies vertigo, he denies bilateral lower extremity paresthesia, he denies persistent weakness,  He also complains of episode of light headedness, improved by glucose intake.  He had MRI, in December 2014, which showed no acute brain finding. Mild chronic appearing small vessel change of the hemispheric white matter, frequently seen in  asymptomatic individuals of this age. Intracranial MR angiography shows no abnormality of the large or medium size vessels.  MR angiography of the neck vessels does not show any atherosclerotic narrowing or irregularity, with specific attention to the carotid bifurcation regions.  He complains of persistent neck pain, feels better when he stretches his neck, bilateral shoulder pain   REVIEW OF SYSTEMS: Full 14 system review of systems performed and notable only for those listed, all others are neg:  Constitutional: N/A  Cardiovascular: N/A  Ear/Nose/Throat: Occasional ringing in the ears Skin: N/A  Eyes: Blurred vision, itching Respiratory: N/A  Gastroitestinal: N/A  Hematology/Lymphatic: N/A  Endocrine: N/A Musculoskeletal: Neck stiffness Allergy/Immunology: N/A  Neurological: N/A Psychiatric: N/A Sleep : NA   ALLERGIES: No Known Allergies  HOME MEDICATIONS: Outpatient Prescriptions Prior to Visit  Medication Sig Dispense Refill  . aspirin 325 MG EC tablet Take 1 tablet (325 mg total) by mouth daily.  90 tablet  2  . atorvastatin (LIPITOR) 40 MG tablet Take 1 tablet (40 mg total) by mouth at bedtime.  60 tablet  2  . carvedilol (COREG) 6.25 MG tablet Take 1 tablet (6.25 mg total) by mouth 2 (two) times daily with a meal.  60 tablet  2  . clopidogrel (PLAVIX) 75 MG tablet Take 1 tablet (75 mg total) by mouth daily.  30 tablet  2  . Coenzyme Q10 (CO Q 10 PO) Take 0.5 tablets by mouth 3 (three) times a week. No specific days      . famotidine (PEPCID) 20 MG tablet Take 1 tablet (20 mg total) by  mouth 2 (two) times daily.  60 tablet  2  . fish oil-omega-3 fatty acids 1000 MG capsule Take 1 g by mouth 2 (two) times daily.       Marland Kitchen LORazepam (ATIVAN) 1 MG tablet Take 1 tablet (1 mg total) by mouth 3 (three) times daily as needed (Dizziness).  15 tablet  0   No facility-administered medications prior to visit.    PAST MEDICAL HISTORY: Past Medical History  Diagnosis Date  .  Hypertension   . Hyperlipidemia   . Acute MI 2009    STENTS PLACED    PAST SURGICAL HISTORY: Past Surgical History  Procedure Laterality Date  . Angioplasty    . Cardiac catheterization      FAMILY HISTORY: Family History  Problem Relation Age of Onset  . Stroke Father   . Alzheimer's disease Father   . High blood pressure Father   . Heart Problems Mother     SOCIAL HISTORY: History   Social History  . Marital Status: Single    Spouse Name: N/A    Number of Children: 2  . Years of Education: B.S.   Occupational History  .      Computers - Works for [employer] History Main Topics  . Smoking status: Current Every Day Smoker -- 1.00 packs/day    Types: Cigarettes  . Smokeless tobacco: Never Used     Comment: states new years resolution is to quit smoking  . Alcohol Use: No  . Drug Use: No  . Sexual Activity: Not on file   Other Topics Concern  . Not on file   Social History Narrative   Patient lives at home alone and he is single. Patient is self employed and works with computers.   Education B.S.   Right handed.   Caffeine one cup of tea daily and two pepsi daily.     PHYSICAL EXAM  Filed Vitals:   04/28/14 0834  BP: 120/71  Pulse: 58  Height: 5\' 7"  (1.702 m)  Weight: 197 lb (89.359 kg)   Body mass index is 30.85 kg/(m^2). Generalized: In no acute distress  Neck: Supple, no carotid bruits  Musculoskeletal: No deformity  Neurological examination  Mentation: Alert oriented to time, place, history taking, and causual conversation  Cranial nerve II-XII: Pupils were equal round reactive to light. Extraocular movements were full. Visual field were full on confrontational test. Bilateral fundi were sharp. Facial sensation and strength were normal. Hearing was intact to finger rubbing bilaterally. Uvula tongue midline. Head turning and shoulder shrug and were normal and symmetric.Tongue protrusion into cheek strength was normal.  Motor: Normal tone,  bulk and strength. No focal weakness Sensory: Intact to fine touch, pinprick, preserved vibratory sensation, and proprioception at toes.  Coordination: Normal finger to nose, heel-to-shin bilaterally there was no truncal ataxia  Gait: Rising up from seated position without assistance, normal stance, without trunk ataxia, moderate stride, good arm swing, smooth turning, able to perform tiptoe, and heel walking with without difficulty . Tandem gait is steady Deep tendon reflexes: Brachioradialis 2/2, biceps 2/2, triceps 2/2, patellar 2/2, Achilles 2/2, plantar responses were flexor bilaterally.     DIAGNOSTIC DATA (LABS, IMAGING, TESTING) - I reviewed patient records, labs, notes, testing and imaging myself where available.      Component Value Date/Time   NA 141 03/09/2014 1247   K 4.0 03/09/2014 1247   CL 107 03/09/2014 1247   CO2 26 03/09/2014 1247   GLUCOSE 89 03/09/2014 1247  BUN 15 03/09/2014 1247   CREATININE 0.98 03/09/2014 1247   CREATININE 1.07 02/06/2013 1847   CALCIUM 9.3 03/09/2014 1247   PROT 6.6 03/09/2014 1247   ALBUMIN 4.2 03/09/2014 1247   AST 13 03/09/2014 1247   ALT 9 03/09/2014 1247   ALKPHOS 63 03/09/2014 1247   BILITOT 1.0 03/09/2014 1247   GFRNONAA 81 03/09/2014 1247   GFRNONAA 71* 02/06/2013 1847   GFRAA >89 03/09/2014 1247   GFRAA 83* 02/06/2013 1847   Lab Results  Component Value Date   CHOL 131 03/09/2014   HDL 42 03/09/2014   LDLCALC 55 03/09/2014   TRIG 170* 03/09/2014   CHOLHDL 3.1 03/09/2014   Lab Results  Component Value Date   HGBA1C 5.9* 03/14/2014      ASSESSMENT AND PLAN  68 y.o. year old male  has vascular risk factors of obesity, hypertension, hyperlipidemia and acute onset of dizziness. He claims his dizziness is gone away since he is eating regular meals and has cut out bread and Pepsi. He has also lost about 7 pounds Also complains of chronic neck pain.MRI of the cervical spine showing mild spondylitic changes at C3-C4, C4-C5 resulting in mild right sided narrowing but no  definite compression.  Patient given a copy of MRI of the cervical spine Continue to control risk factors of obesity, hypertension, hyperlipidemia Continue neck stretching exercises Followup when necessary Dennie Bible, [facility], [facility], [facility] [address] [place], [place] [address] [phone]

## 2014-04-28 NOTE — Patient Instructions (Signed)
Patient given a copy of MRI of the cervical spine Continue to control risk factors of obesity, hypertension, hyperlipidemia Continue neck stretching exercises Followup when necessary

## 2014-05-04 ENCOUNTER — Ambulatory Visit: Payer: Medicare Other | Admitting: Cardiology

## 2014-05-05 ENCOUNTER — Ambulatory Visit: Payer: Medicare Other

## 2014-05-05 VITALS — BP 103/69 | HR 59 | Temp 98.2°F | Resp 14

## 2014-05-05 MED ORDER — GUAIFENESIN-DM 100-10 MG/5ML PO SYRP: 5.0000 mL | ORAL_SOLUTION | ORAL | Status: DC | PRN

## 2014-05-05 MED ORDER — CETIRIZINE HCL 10 MG PO TABS: 10.0000 mg | ORAL_TABLET | Freq: Every day | ORAL | Status: DC

## 2014-05-05 MED ORDER — FLUTICASONE PROPIONATE 50 MCG/ACT NA SUSP: 2.0000 | Freq: Every day | NASAL | Status: DC

## 2014-05-05 NOTE — Progress Notes (Unsigned)
Patient presents to walk-in clinic with c/o cough, aches and cold. Patient states he has a runny nose and sneezing. Patient denies coughing up any green or yellowish phlegm. Patient states he slept with the air conditioner on him last night. Patient denies trying OTC medications.  No sinus tenderness on palpation.  Verbal order received for Zyrtec, Robitussin DM,, and Flonase. Vivia Birmingham, RN

## 2014-05-05 NOTE — Patient Instructions (Signed)
Upper Respiratory Infection, Adult An upper respiratory infection (URI) is also sometimes known as the common cold. The upper respiratory tract includes the nose, sinuses, throat, trachea, and bronchi. Bronchi are the airways leading to the lungs. Most people improve within 1 week, but symptoms can last up to 2 weeks. A residual cough may last even longer.  CAUSES Many different viruses can infect the tissues lining the upper respiratory tract. The tissues become irritated and inflamed and often become very moist. Mucus production is also common. A cold is contagious. You can easily spread the virus to others by oral contact. This includes kissing, sharing a glass, coughing, or sneezing. Touching your mouth or nose and then touching a surface, which is then touched by another person, can also spread the virus. SYMPTOMS  Symptoms typically develop 1 to 3 days after you come in contact with a cold virus. Symptoms vary from person to person. They may include:  Runny nose.  Sneezing.  Nasal congestion.  Sinus irritation.  Sore throat.  Loss of voice (laryngitis).  Cough.  Fatigue.  Muscle aches.  Loss of appetite.  Headache.  Low-grade fever. DIAGNOSIS  You might diagnose your own cold based on familiar symptoms, since most people get a cold 2 to 3 times a year. Your caregiver can confirm this based on your exam. Most importantly, your caregiver can check that your symptoms are not due to another disease such as strep throat, sinusitis, pneumonia, asthma, or epiglottitis. Blood tests, throat tests, and X-rays are not necessary to diagnose a common cold, but they may sometimes be helpful in excluding other more serious diseases. Your caregiver will decide if any further tests are required. RISKS AND COMPLICATIONS  You may be at risk for a more severe case of the common cold if you smoke cigarettes, have chronic heart disease (such as heart failure) or lung disease (such as asthma), or if  you have a weakened immune system. The very young and very old are also at risk for more serious infections. Bacterial sinusitis, middle ear infections, and bacterial pneumonia can complicate the common cold. The common cold can worsen asthma and chronic obstructive pulmonary disease (COPD). Sometimes, these complications can require emergency medical care and may be life-threatening. PREVENTION  The best way to protect against getting a cold is to practice good hygiene. Avoid oral or hand contact with people with cold symptoms. Wash your hands often if contact occurs. There is no clear evidence that vitamin C, vitamin E, echinacea, or exercise reduces the chance of developing a cold. However, it is always recommended to get plenty of rest and practice good nutrition. TREATMENT  Treatment is directed at relieving symptoms. There is no cure. Antibiotics are not effective, because the infection is caused by a virus, not by bacteria. Treatment may include:  Increased fluid intake. Sports drinks offer valuable electrolytes, sugars, and fluids.  Breathing heated mist or steam (vaporizer or shower).  Eating chicken soup or other clear broths, and maintaining good nutrition.  Getting plenty of rest.  Using gargles or lozenges for comfort.  Controlling fevers with ibuprofen or acetaminophen as directed by your caregiver.  Increasing usage of your inhaler if you have asthma. Zinc gel and zinc lozenges, taken in the first 24 hours of the common cold, can shorten the duration and lessen the severity of symptoms. Pain medicines may help with fever, muscle aches, and throat pain. A variety of non-prescription medicines are available to treat congestion and runny nose. Your caregiver   can make recommendations and may suggest nasal or lung inhalers for other symptoms.  HOME CARE INSTRUCTIONS   Only take over-the-counter or prescription medicines for pain, discomfort, or fever as directed by your  caregiver.  Use a warm mist humidifier or inhale steam from a shower to increase air moisture. This may keep secretions moist and make it easier to breathe.  Drink enough water and fluids to keep your urine clear or pale yellow.  Rest as needed.  Return to work when your temperature has returned to normal or as your caregiver advises. You may need to stay home longer to avoid infecting others. You can also use a face mask and careful hand washing to prevent spread of the virus. SEEK MEDICAL CARE IF:   After the first few days, you feel you are getting worse rather than better.  You need your caregiver's advice about medicines to control symptoms.  You develop chills, worsening shortness of breath, or brown or red sputum. These may be signs of pneumonia.  You develop yellow or brown nasal discharge or pain in the face, especially when you bend forward. These may be signs of sinusitis.  You develop a fever, swollen neck glands, pain with swallowing, or white areas in the back of your throat. These may be signs of strep throat. SEEK IMMEDIATE MEDICAL CARE IF:   You have a fever.  You develop severe or persistent headache, ear pain, sinus pain, or chest pain.  You develop wheezing, a prolonged cough, cough up blood, or have a change in your usual mucus (if you have chronic lung disease).  You develop sore muscles or a stiff neck. Document Released: 02/19/2001 Document Revised: 11/18/2011 Document Reviewed: 12/01/2013 ExitCare Patient Information 2015 [employer]. This information is not intended to replace advice given to you by your health care provider. Make sure you discuss any questions you have with your health care provider.  

## 2014-05-11 ENCOUNTER — Encounter: Payer: Self-pay | Admitting: Cardiology

## 2014-05-11 ENCOUNTER — Ambulatory Visit: Payer: Medicare Other | Attending: Cardiology | Admitting: Cardiology

## 2014-05-11 VITALS — BP 116/71 | HR 62 | Temp 98.6°F | Resp 16 | Ht 67.0 in | Wt 198.0 lb

## 2014-05-11 DIAGNOSIS — E785 Hyperlipidemia, unspecified: Secondary | ICD-10-CM

## 2014-05-11 DIAGNOSIS — F17200 Nicotine dependence, unspecified, uncomplicated: Secondary | ICD-10-CM | POA: Diagnosis not present

## 2014-05-11 DIAGNOSIS — I252 Old myocardial infarction: Secondary | ICD-10-CM | POA: Diagnosis not present

## 2014-05-11 DIAGNOSIS — I10 Essential (primary) hypertension: Secondary | ICD-10-CM | POA: Diagnosis not present

## 2014-05-11 DIAGNOSIS — R42 Dizziness and giddiness: Secondary | ICD-10-CM

## 2014-05-11 DIAGNOSIS — Z720 Tobacco use: Secondary | ICD-10-CM

## 2014-05-11 DIAGNOSIS — I2510 Atherosclerotic heart disease of native coronary artery without angina pectoris: Secondary | ICD-10-CM | POA: Insufficient documentation

## 2014-05-11 NOTE — Assessment & Plan Note (Signed)
Patient has cut back. Encouraged further efforts at cessation.

## 2014-05-11 NOTE — Assessment & Plan Note (Signed)
Continue atorvastatin. LDL is at goal. Decrease carbohydrates and weight.

## 2014-05-11 NOTE — Progress Notes (Signed)
HPI Mr Samuel Lawson returns today for followup and management of his history of coronary artery disease, history of myocardial infarction in 2009, and stent to the right coronary artery.  He has no symptoms of ischemic heart disease. He denies orthopnea, PND or edema. He is still smoking but has cut back to a half a pack a day. He wants to join the [facility] to start exercising. I strongly encouraged him to do so. He is compliant with his medications. He seems to be compliant with his diet.  Past Medical History  Diagnosis Date  . Hypertension   . Hyperlipidemia   . Acute MI 2009    STENTS PLACED    Current Outpatient Prescriptions  Medication Sig Dispense Refill  . aspirin 325 MG EC tablet Take 1 tablet (325 mg total) by mouth daily.  90 tablet  2  . atorvastatin (LIPITOR) 40 MG tablet Take 1 tablet (40 mg total) by mouth at bedtime.  60 tablet  2  . carvedilol (COREG) 6.25 MG tablet Take 1 tablet (6.25 mg total) by mouth 2 (two) times daily with a meal.  60 tablet  2  . cetirizine (ZYRTEC) 10 MG tablet Take 1 tablet (10 mg total) by mouth daily.  30 tablet  1  . clopidogrel (PLAVIX) 75 MG tablet Take 1 tablet (75 mg total) by mouth daily.  30 tablet  2  . Coenzyme Q10 (CO Q 10 PO) Take 0.5 tablets by mouth 3 (three) times a week. No specific days      . famotidine (PEPCID) 20 MG tablet Take 1 tablet (20 mg total) by mouth 2 (two) times daily.  60 tablet  2  . fish oil-omega-3 fatty acids 1000 MG capsule Take 1 g by mouth 2 (two) times daily.       . fluticasone (FLONASE) 50 MCG/ACT nasal spray Place 2 sprays into both nostrils daily.  16 g  6  . guaiFENesin-dextromethorphan (ROBITUSSIN DM) 100-10 MG/5ML syrup Take 5 mLs by mouth every 4 (four) hours as needed for cough.  118 mL  0  . [DISCONTINUED] promethazine (PHENERGAN) 25 MG tablet Take 1 tablet (25 mg total) by mouth every 6 (six) hours as needed for nausea.  20 tablet  0   No current facility-administered medications for this visit.    No  Known Allergies  Family History  Problem Relation Age of Onset  . Stroke Father   . Alzheimer's disease Father   . High blood pressure Father   . Heart Problems Mother     History   Social History  . Marital Status: Single    Spouse Name: N/A    Number of Children: 2  . Years of Education: B.S.   Occupational History  .      Computers - Works for [employer] History Main Topics  . Smoking status: Current Every Day Smoker -- 1.00 packs/day    Types: Cigarettes  . Smokeless tobacco: Never Used     Comment: states new years resolution is to quit smoking  . Alcohol Use: No  . Drug Use: No  . Sexual Activity: Not on file   Other Topics Concern  . Not on file   Social History Narrative   Patient lives at home alone and he is single. Patient is self employed and works with computers.   Education B.S.   Right handed.   Caffeine one cup of tea daily and two pepsi daily.    ROS ALL NEGATIVE EXCEPT  THOSE NOTED IN HPI  PE  General Appearance: well developed, well nourished in no acute distress, overweight HEENT: symmetrical face, PERRLA,  Neck: no JVD, thyromegaly, or adenopathy, trachea midline Chest: symmetric without deformity Cardiac: PMI non-displaced, RRR, normal S1, S2, no gallop or murmur Lung: Decreased breath sounds throughout Vascular: all pulses full without bruits  Abdominal: nondistended, nontender, good bowel sounds, , no bruits Extremities: no cyanosis, clubbing or edema, no sign of DVT, no varicosities  Skin: normal color, no rashes Neuro: alert and oriented x 3, non-focal Pysch: normal affect  EKG Not repeated  BMET    Component Value Date/Time   NA 141 03/09/2014 1247   K 4.0 03/09/2014 1247   CL 107 03/09/2014 1247   CO2 26 03/09/2014 1247   GLUCOSE 89 03/09/2014 1247   BUN 15 03/09/2014 1247   CREATININE 0.98 03/09/2014 1247   CREATININE 1.07 02/06/2013 1847   CALCIUM 9.3 03/09/2014 1247   GFRNONAA 81 03/09/2014 1247   GFRNONAA 71* 02/06/2013 1847    GFRAA >89 03/09/2014 1247   GFRAA 83* 02/06/2013 1847    Lipid Panel     Component Value Date/Time   CHOL 131 03/09/2014 1247   TRIG 170* 03/09/2014 1247   HDL 42 03/09/2014 1247   CHOLHDL 3.1 03/09/2014 1247   VLDL 34 03/09/2014 1247   LDLCALC 55 03/09/2014 1247    CBC    Component Value Date/Time   WBC 8.9 02/06/2013 1847   RBC 4.85 02/06/2013 1847   HGB 15.3 02/06/2013 1847   HCT 44.4 02/06/2013 1847   PLT 136* 02/06/2013 1847   MCV 91.5 02/06/2013 1847   MCH 31.5 02/06/2013 1847   MCHC 34.5 02/06/2013 1847   RDW 13.1 02/06/2013 1847   LYMPHSABS 1.1 02/06/2013 1847   MONOABS 0.4 02/06/2013 1847   EOSABS 0.1 02/06/2013 1847   BASOSABS 0.0 02/06/2013 1847

## 2014-05-11 NOTE — Patient Instructions (Signed)
Return to clinic for Flu vaccination once cough symptoms are gone Please return in 6 months

## 2014-05-11 NOTE — Progress Notes (Signed)
Pt here for f/u HTN,CAD with medication management Pt is compliant with daily medications States he has cut back on 1/2 pack cigarettes/per day Denies chest pain,sob or swelling No refills needed today Pt refused flu vaccine due to URI sx's

## 2014-05-11 NOTE — Assessment & Plan Note (Signed)
Stable. Have encouraged to join the [facility] and exercise. Continue current medications including aspirin, Plavix, carvedilol, atorvastatin. Return to see me in 6 months. Patient needs flu shot with his COPD and history of heart disease.

## 2014-05-11 NOTE — Assessment & Plan Note (Signed)
Good control. Patient advised to avoid salty foods and continue current medication

## 2014-06-01 ENCOUNTER — Encounter: Payer: Self-pay | Admitting: Internal Medicine

## 2014-06-01 ENCOUNTER — Ambulatory Visit: Payer: Medicare Other | Attending: Internal Medicine | Admitting: Internal Medicine

## 2014-06-01 VITALS — BP 117/78 | HR 67 | Temp 98.0°F | Resp 16 | Wt 199.8 lb

## 2014-06-01 DIAGNOSIS — F17200 Nicotine dependence, unspecified, uncomplicated: Secondary | ICD-10-CM | POA: Diagnosis not present

## 2014-06-01 DIAGNOSIS — Z23 Encounter for immunization: Secondary | ICD-10-CM | POA: Diagnosis not present

## 2014-06-01 DIAGNOSIS — Z7982 Long term (current) use of aspirin: Secondary | ICD-10-CM | POA: Diagnosis not present

## 2014-06-01 DIAGNOSIS — I10 Essential (primary) hypertension: Secondary | ICD-10-CM | POA: Insufficient documentation

## 2014-06-01 DIAGNOSIS — E785 Hyperlipidemia, unspecified: Secondary | ICD-10-CM | POA: Diagnosis not present

## 2014-06-01 DIAGNOSIS — I2510 Atherosclerotic heart disease of native coronary artery without angina pectoris: Secondary | ICD-10-CM

## 2014-06-01 DIAGNOSIS — Z9861 Coronary angioplasty status: Secondary | ICD-10-CM | POA: Insufficient documentation

## 2014-06-01 DIAGNOSIS — R079 Chest pain, unspecified: Secondary | ICD-10-CM | POA: Diagnosis not present

## 2014-06-01 DIAGNOSIS — I252 Old myocardial infarction: Secondary | ICD-10-CM | POA: Diagnosis not present

## 2014-06-01 DIAGNOSIS — R0781 Pleurodynia: Secondary | ICD-10-CM

## 2014-06-01 MED ORDER — NICOTINE POLACRILEX 4 MG MT GUM: 4.0000 mg | CHEWING_GUM | OROMUCOSAL | Status: DC | PRN

## 2014-06-01 NOTE — Progress Notes (Signed)
Patient complains of having some congestion in his chest Was coughing up some phlegm Today complains of soreness to left side of chest and back pain on the Left side of his back

## 2014-06-01 NOTE — Progress Notes (Signed)
MRN: [id number] Name: Samuel Lawson  Sex: male Age: 68 y.o. DOB: 1946-02-17  Allergies: Review of patient's allergies indicates no known allergies.  Chief Complaint  Patient presents with  . Nasal Congestion    HPI: Patient is 68 y.o. male who has history of hypertension CAD GERD hyperlipidemia comes today for followup, last month he had some cold symptoms with productive cough patient tried her some cough medication and antiallergy medication nasal spray reports improvement in the symptoms denies any more cough but does complain of some left-sided rib pain which sometimes goes all the way to the back, denies any fall or trauma the pain is more worse if he lays on that side and gets better with certain posture. Patient denies any fever chills headache dizziness shortness of breath. Patient does smoke cigarettes, wants to quit smoking and is requesting prescription for nicotine gum which she tried in the past and worked.   Past Medical History  Diagnosis Date  . Hypertension   . Hyperlipidemia   . Acute MI 2009    STENTS PLACED    Past Surgical History  Procedure Laterality Date  . Angioplasty    . Cardiac catheterization        Medication List       This list is accurate as of: 06/01/14  5:34 PM.  Always use your most recent med list.               aspirin 325 MG EC tablet  Take 1 tablet (325 mg total) by mouth daily.     atorvastatin 40 MG tablet  Commonly known as:  LIPITOR  Take 1 tablet (40 mg total) by mouth at bedtime.     carvedilol 6.25 MG tablet  Commonly known as:  COREG  Take 1 tablet (6.25 mg total) by mouth 2 (two) times daily with a meal.     cetirizine 10 MG tablet  Commonly known as:  ZYRTEC  Take 1 tablet (10 mg total) by mouth daily.     clopidogrel 75 MG tablet  Commonly known as:  PLAVIX  Take 1 tablet (75 mg total) by mouth daily.     famotidine 20 MG tablet  Commonly known as:  PEPCID  Take 1 tablet (20 mg total) by mouth 2 (two)  times daily.     fish oil-omega-3 fatty acids 1000 MG capsule  Take 1 g by mouth 2 (two) times daily.     fluticasone 50 MCG/ACT nasal spray  Commonly known as:  FLONASE  Place 2 sprays into both nostrils daily.     guaiFENesin-dextromethorphan 100-10 MG/5ML syrup  Commonly known as:  ROBITUSSIN DM  Take 5 mLs by mouth every 4 (four) hours as needed for cough.     nicotine polacrilex 4 MG gum  Commonly known as:  NICORETTE  Take 1 each (4 mg total) by mouth as needed for smoking cessation.        Meds ordered this encounter  Medications  . nicotine polacrilex (NICORETTE) 4 MG gum    Sig: Take 1 each (4 mg total) by mouth as needed for smoking cessation.    Dispense:  100 tablet    Refill:  0    Immunization History  Administered Date(s) Administered  . Influenza,inj,Quad PF,36+ Mos 08/25/2013, 06/01/2014    Family History  Problem Relation Age of Onset  . Stroke Father   . Alzheimer's disease Father   . High blood pressure Father   . Heart Problems Mother  History  Substance Use Topics  . Smoking status: Current Every Day Smoker -- 1.00 packs/day    Types: Cigarettes  . Smokeless tobacco: Never Used     Comment: states new years resolution is to quit smoking  . Alcohol Use: No    Review of Systems   As noted in HPI  Filed Vitals:   06/01/14 1709  BP: 117/78  Pulse: 67  Temp: 98 F (36.7 C)  Resp: 16    Physical Exam  Physical Exam  Constitutional: No distress.  Eyes: EOM are normal. Pupils are equal, round, and reactive to light.  Cardiovascular: Normal rate and regular rhythm.   Pulmonary/Chest: Breath sounds normal. No respiratory distress. He has no wheezes. He has no rales.  Musculoskeletal: He exhibits no edema.    CBC    Component Value Date/Time   WBC 8.9 02/06/2013 1847   RBC 4.85 02/06/2013 1847   HGB 15.3 02/06/2013 1847   HCT 44.4 02/06/2013 1847   PLT 136* 02/06/2013 1847   MCV 91.5 02/06/2013 1847   LYMPHSABS 1.1 02/06/2013  1847   MONOABS 0.4 02/06/2013 1847   EOSABS 0.1 02/06/2013 1847   BASOSABS 0.0 02/06/2013 1847    CMP     Component Value Date/Time   NA 141 03/09/2014 1247   K 4.0 03/09/2014 1247   CL 107 03/09/2014 1247   CO2 26 03/09/2014 1247   GLUCOSE 89 03/09/2014 1247   BUN 15 03/09/2014 1247   CREATININE 0.98 03/09/2014 1247   CREATININE 1.07 02/06/2013 1847   CALCIUM 9.3 03/09/2014 1247   PROT 6.6 03/09/2014 1247   ALBUMIN 4.2 03/09/2014 1247   AST 13 03/09/2014 1247   ALT 9 03/09/2014 1247   ALKPHOS 63 03/09/2014 1247   BILITOT 1.0 03/09/2014 1247   GFRNONAA 81 03/09/2014 1247   GFRNONAA 71* 02/06/2013 1847   GFRAA >89 03/09/2014 1247   GFRAA 83* 02/06/2013 1847    Lab Results  Component Value Date/Time   CHOL 131 03/09/2014 12:47 PM    No components found with this basename: hga1c    Lab Results  Component Value Date/Time   AST 13 03/09/2014 12:47 PM    Assessment and Plan  Essential hypertension Blood pressure is well-controlled continued current meds.  Rib pain on left side - Plan: Ordered an x-ray DG Ribs Unilateral Left  Smoking - Plan: Prescribed her nicotine polacrilex (NICORETTE) 4 MG gum  Need for prophylactic vaccination and inoculation against influenza Flu shot given today    Return in about 3 months (around 08/31/2014) for hypertension.  Lorayne Marek, MD

## 2014-06-02 ENCOUNTER — Ambulatory Visit (HOSPITAL_COMMUNITY)
Admission: RE | Admit: 2014-06-02 | Discharge: 2014-06-02 | Disposition: A | Discharge status: Home / Self Care | Payer: Medicare Other | Source: Ambulatory Visit | Attending: Internal Medicine | Admitting: Internal Medicine

## 2014-06-02 ENCOUNTER — Other Ambulatory Visit: Payer: Self-pay | Admitting: Internal Medicine

## 2014-06-02 DIAGNOSIS — R079 Chest pain, unspecified: Secondary | ICD-10-CM | POA: Diagnosis not present

## 2014-06-02 DIAGNOSIS — R0781 Pleurodynia: Secondary | ICD-10-CM

## 2014-06-02 DIAGNOSIS — R0989 Other specified symptoms and signs involving the circulatory and respiratory systems: Secondary | ICD-10-CM | POA: Diagnosis not present

## 2014-06-02 DIAGNOSIS — F17200 Nicotine dependence, unspecified, uncomplicated: Secondary | ICD-10-CM | POA: Insufficient documentation

## 2014-06-02 DIAGNOSIS — R059 Cough, unspecified: Secondary | ICD-10-CM | POA: Diagnosis not present

## 2014-06-02 DIAGNOSIS — R05 Cough: Secondary | ICD-10-CM | POA: Diagnosis not present

## 2014-06-06 ENCOUNTER — Telehealth: Payer: Self-pay | Admitting: Internal Medicine

## 2014-06-06 ENCOUNTER — Other Ambulatory Visit: Payer: Self-pay | Admitting: Emergency Medicine

## 2014-06-06 ENCOUNTER — Telehealth: Payer: Self-pay | Admitting: Emergency Medicine

## 2014-06-06 MED ORDER — CYCLOBENZAPRINE HCL 10 MG PO TABS: 10.0000 mg | ORAL_TABLET | Freq: Three times a day (TID) | ORAL | Status: DC | PRN

## 2014-06-06 MED ORDER — MELOXICAM 7.5 MG PO TABS: 7.5000 mg | ORAL_TABLET | Freq: Every day | ORAL | Status: DC

## 2014-06-06 NOTE — Telephone Encounter (Signed)
Patient was seen in for [place] on 06/01/14. Patient was referred to [facility] for chest xray. Chest x ray was performed on 06/02/14. Patient calling to speak to nurse in regards to results. Patient states he is still having some pain in chest area and needs to know if another OV is necessary. Please assist.

## 2014-06-06 NOTE — Telephone Encounter (Signed)
Pt given normal chest xray results. Pt c/o chest soreness with pain radiating under left arm intemit for months. Pt was seen by Dr. Verl Blalock 05/11/14 with negative exam. Pt prescribed Flexeril for chest discomfort and instructed to come here if sx worsens or go to ER.

## 2014-07-27 ENCOUNTER — Encounter: Payer: Self-pay | Admitting: Neurology

## 2014-08-02 ENCOUNTER — Encounter: Payer: Self-pay | Admitting: Neurology

## 2014-08-08 ENCOUNTER — Other Ambulatory Visit: Payer: Self-pay | Admitting: Internal Medicine

## 2014-08-11 ENCOUNTER — Other Ambulatory Visit: Payer: Self-pay

## 2014-08-11 DIAGNOSIS — E7800 Pure hypercholesterolemia, unspecified: Secondary | ICD-10-CM

## 2014-08-11 DIAGNOSIS — K219 Gastro-esophageal reflux disease without esophagitis: Secondary | ICD-10-CM

## 2014-08-11 DIAGNOSIS — I10 Essential (primary) hypertension: Secondary | ICD-10-CM

## 2014-08-11 MED ORDER — CLOPIDOGREL BISULFATE 75 MG PO TABS: 75.0000 mg | ORAL_TABLET | Freq: Every day | ORAL | Status: DC

## 2014-08-11 MED ORDER — CARVEDILOL 6.25 MG PO TABS: 6.2500 mg | ORAL_TABLET | Freq: Two times a day (BID) | ORAL | Status: DC

## 2014-08-11 MED ORDER — ATORVASTATIN CALCIUM 40 MG PO TABS: 40.0000 mg | ORAL_TABLET | Freq: Every day | ORAL | Status: DC

## 2014-08-11 MED ORDER — FAMOTIDINE 20 MG PO TABS: 20.0000 mg | ORAL_TABLET | Freq: Two times a day (BID) | ORAL | Status: DC

## 2014-08-22 ENCOUNTER — Other Ambulatory Visit: Payer: Self-pay

## 2014-08-22 ENCOUNTER — Encounter: Payer: Self-pay | Admitting: Internal Medicine

## 2014-08-22 ENCOUNTER — Ambulatory Visit (HOSPITAL_COMMUNITY)
Admission: RE | Admit: 2014-08-22 | Discharge: 2014-08-22 | Disposition: A | Discharge status: Home / Self Care | Payer: Medicare Other | Source: Ambulatory Visit | Attending: Internal Medicine | Admitting: Internal Medicine

## 2014-08-22 ENCOUNTER — Ambulatory Visit: Payer: Medicare Other | Attending: Internal Medicine | Admitting: Internal Medicine

## 2014-08-22 VITALS — BP 115/87 | HR 64 | Temp 98.0°F | Resp 16 | Wt 203.6 lb

## 2014-08-22 DIAGNOSIS — I2510 Atherosclerotic heart disease of native coronary artery without angina pectoris: Secondary | ICD-10-CM | POA: Insufficient documentation

## 2014-08-22 DIAGNOSIS — Z9861 Coronary angioplasty status: Secondary | ICD-10-CM | POA: Insufficient documentation

## 2014-08-22 DIAGNOSIS — K219 Gastro-esophageal reflux disease without esophagitis: Secondary | ICD-10-CM | POA: Insufficient documentation

## 2014-08-22 DIAGNOSIS — I10 Essential (primary) hypertension: Secondary | ICD-10-CM | POA: Diagnosis not present

## 2014-08-22 DIAGNOSIS — Z720 Tobacco use: Secondary | ICD-10-CM | POA: Insufficient documentation

## 2014-08-22 DIAGNOSIS — I252 Old myocardial infarction: Secondary | ICD-10-CM | POA: Insufficient documentation

## 2014-08-22 DIAGNOSIS — Z7902 Long term (current) use of antithrombotics/antiplatelets: Secondary | ICD-10-CM | POA: Diagnosis not present

## 2014-08-22 DIAGNOSIS — Z7982 Long term (current) use of aspirin: Secondary | ICD-10-CM | POA: Insufficient documentation

## 2014-08-22 DIAGNOSIS — IMO0001 Reserved for inherently not codable concepts without codable children: Secondary | ICD-10-CM

## 2014-08-22 DIAGNOSIS — I25811 Atherosclerosis of native coronary artery of transplanted heart without angina pectoris: Secondary | ICD-10-CM | POA: Insufficient documentation

## 2014-08-22 DIAGNOSIS — E7800 Pure hypercholesterolemia, unspecified: Secondary | ICD-10-CM

## 2014-08-22 DIAGNOSIS — I440 Atrioventricular block, first degree: Secondary | ICD-10-CM | POA: Insufficient documentation

## 2014-08-22 DIAGNOSIS — F17200 Nicotine dependence, unspecified, uncomplicated: Secondary | ICD-10-CM

## 2014-08-22 DIAGNOSIS — F17210 Nicotine dependence, cigarettes, uncomplicated: Secondary | ICD-10-CM | POA: Insufficient documentation

## 2014-08-22 DIAGNOSIS — Z79899 Other long term (current) drug therapy: Secondary | ICD-10-CM | POA: Insufficient documentation

## 2014-08-22 LAB — COMPLETE METABOLIC PANEL WITH GFR
ALBUMIN: 4.4 g/dL (ref 3.5–5.2)
ALT: 12 U/L (ref 0–53)
AST: 15 U/L (ref 0–37)
Alkaline Phosphatase: 65 U/L (ref 39–117)
BILIRUBIN TOTAL: 0.7 mg/dL (ref 0.2–1.2)
BUN: 17 mg/dL (ref 6–23)
CO2: 26 meq/L (ref 19–32)
Calcium: 9.8 mg/dL (ref 8.4–10.5)
Chloride: 107 mEq/L (ref 96–112)
Creat: 1 mg/dL (ref 0.50–1.35)
GFR, Est African American: >89 mL/min
GFR, Est Non African American: 78 mL/min
Glucose, Bld: 105 mg/dL — ABNORMAL HIGH (ref 70–99)
Potassium: 4.3 mEq/L (ref 3.5–5.3)
Sodium: 142 mEq/L (ref 135–145)
TOTAL PROTEIN: 7.1 g/dL (ref 6.0–8.3)

## 2014-08-22 MED ORDER — FAMOTIDINE 20 MG PO TABS: 20.0000 mg | ORAL_TABLET | Freq: Two times a day (BID) | ORAL | Status: DC

## 2014-08-22 MED ORDER — CARVEDILOL 6.25 MG PO TABS: 6.2500 mg | ORAL_TABLET | Freq: Two times a day (BID) | ORAL | Status: DC

## 2014-08-22 MED ORDER — ATORVASTATIN CALCIUM 40 MG PO TABS: 40.0000 mg | ORAL_TABLET | Freq: Every day | ORAL | Status: DC

## 2014-08-22 NOTE — Progress Notes (Signed)
MRN: [id number] Name: Samuel Lawson  Sex: male Age: 68 y.o. DOB: May 16, 1946  Allergies: Review of patient's allergies indicates no known allergies.  Chief Complaint  Patient presents with  . Follow-up    HPI: Patient is 68 y.o. 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  Past Medical History  Diagnosis Date  . Hypertension   . Hyperlipidemia   . Acute MI 2009    STENTS PLACED    Past Surgical History  Procedure Laterality Date  . Angioplasty    . Cardiac catheterization        Medication List       This list is accurate as of: 08/22/14  5:14 PM.  Always use your most recent med list.               aspirin 325 MG EC tablet  Take 1 tablet (325 mg total) by mouth daily.     atorvastatin 40 MG tablet  Commonly known as:  LIPITOR  Take 1 tablet (40 mg total) by mouth at bedtime.     carvedilol 6.25 MG tablet  Commonly known as:  COREG  Take 1 tablet (6.25 mg total) by mouth 2 (two) times daily with a meal.     cetirizine 10 MG tablet  Commonly known as:  ZYRTEC  Take 1 tablet (10 mg total) by mouth daily.     clopidogrel 75 MG tablet  Commonly known as:  PLAVIX  Take 1 tablet (75 mg total) by mouth daily.     cyclobenzaprine 10 MG tablet  Commonly known as:  FLEXERIL  Take 1 tablet (10 mg total) by mouth 3 (three) times daily as needed for muscle spasms.     famotidine 20 MG tablet  Commonly known as:  PEPCID  Take 1 tablet (20 mg  total) by mouth 2 (two) times daily.     fish oil-omega-3 fatty acids 1000 MG capsule  Take 1 g by mouth 2 (two) times daily.     fluticasone 50 MCG/ACT nasal spray  Commonly known as:  FLONASE  Place 2 sprays into both nostrils daily.     guaiFENesin-dextromethorphan 100-10 MG/5ML syrup  Commonly known as:  ROBITUSSIN DM  Take 5 mLs by mouth every 4 (four) hours as needed for cough.     nicotine polacrilex 4 MG gum  Commonly known as:  NICORETTE  Take 1 each (4 mg total) by mouth as needed for smoking cessation.        Meds ordered this encounter  Medications  . atorvastatin (LIPITOR) 40 MG tablet    Sig: Take 1 tablet (40 mg total) by mouth at bedtime.    Dispense:  30 tablet    Refill:  2  . carvedilol (COREG) 6.25 MG tablet    Sig: Take 1 tablet (6.25 mg total) by mouth 2 (two) times daily with a meal.    Dispense:  30 tablet    Refill:  2  . famotidine (PEPCID) 20 MG  tablet    Sig: Take 1 tablet (20 mg total) by mouth 2 (two) times daily.    Dispense:  60 tablet    Refill:  2    Immunization History  Administered Date(s) Administered  . Influenza,inj,Quad PF,36+ Mos 08/25/2013, 06/01/2014    Family History  Problem Relation Age of Onset  . Stroke Father   . Alzheimer's disease Father   . High blood pressure Father   . Heart Problems Mother     History  Substance Use Topics  . Smoking status: Current Every Day Smoker -- 1.00 packs/day    Types: Cigarettes  . Smokeless tobacco: Never Used     Comment: states new years resolution is to quit smoking  . Alcohol Use: No    Review of Systems   As noted in HPI  Filed Vitals:   08/22/14 1644  BP: 115/87  Pulse: 64  Temp: 98 F (36.7 C)  Resp: 16    Physical Exam  Physical Exam  Constitutional: No distress.  Eyes: EOM are normal. Pupils are equal, round, and reactive to light.  Cardiovascular: Normal rate, regular rhythm and normal heart sounds.   Pulmonary/Chest: Breath sounds normal. No  respiratory distress. He has no wheezes. He has no rales.  Some point tenderness on the left chest wall   Musculoskeletal: He exhibits no edema.    CBC    Component Value Date/Time   WBC 8.9 02/06/2013 1847   RBC 4.85 02/06/2013 1847   HGB 15.3 02/06/2013 1847   HCT 44.4 02/06/2013 1847   PLT 136* 02/06/2013 1847   MCV 91.5 02/06/2013 1847   LYMPHSABS 1.1 02/06/2013 1847   MONOABS 0.4 02/06/2013 1847   EOSABS 0.1 02/06/2013 1847   BASOSABS 0.0 02/06/2013 1847    CMP     Component Value Date/Time   NA 141 03/09/2014 1247   K 4.0 03/09/2014 1247   CL 107 03/09/2014 1247   CO2 26 03/09/2014 1247   GLUCOSE 89 03/09/2014 1247   BUN 15 03/09/2014 1247   CREATININE 0.98 03/09/2014 1247   CREATININE 1.07 02/06/2013 1847   CALCIUM 9.3 03/09/2014 1247   PROT 6.6 03/09/2014 1247   ALBUMIN 4.2 03/09/2014 1247   AST 13 03/09/2014 1247   ALT 9 03/09/2014 1247   ALKPHOS 63 03/09/2014 1247   BILITOT 1.0 03/09/2014 1247   GFRNONAA 81 03/09/2014 1247   GFRNONAA 71* 02/06/2013 1847   GFRAA >89 03/09/2014 1247   GFRAA 77* 02/06/2013 1847    Lab Results  Component Value Date/Time   CHOL 131 03/09/2014 12:47 PM    No components found for: HGA1C  Lab Results  Component Value Date/Time   AST 13 03/09/2014 12:47 PM    Assessment and Plan  Essential hypertension - Plan: blood pressure is well-controlled continued current meds carvedilol (COREG) 6.25 MG tablet, COMPLETE METABOLIC PANEL WITH GFR   High cholesterol - Plan: Currently patient is onatorvastatin (LIPITOR) 40 MG tablet  Gastroesophageal reflux disease without esophagitis - Plan: famotidine (PEPCID) 20 MG tablet  Smoking Again advised patient to quit smoking  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  Lorayne Marek, MD

## 2014-08-22 NOTE — Progress Notes (Signed)
Patient complains of left sided chest pain and in need of medication refills

## 2014-08-23 LAB — TROPONIN I: TROPONIN I: 0.01 ng/mL (ref <0.06)

## 2014-10-07 ENCOUNTER — Emergency Department (INDEPENDENT_AMBULATORY_CARE_PROVIDER_SITE_OTHER)
Admission: EM | Admit: 2014-10-07 | Discharge: 2014-10-07 | Disposition: A | Discharge status: Home / Self Care | Payer: Medicare Other | Source: Home / Self Care | Attending: Family Medicine | Admitting: Family Medicine

## 2014-10-07 ENCOUNTER — Emergency Department (INDEPENDENT_AMBULATORY_CARE_PROVIDER_SITE_OTHER): Payer: Medicare Other

## 2014-10-07 ENCOUNTER — Encounter (HOSPITAL_COMMUNITY): Payer: Self-pay | Admitting: Emergency Medicine

## 2014-10-07 DIAGNOSIS — R05 Cough: Secondary | ICD-10-CM | POA: Diagnosis not present

## 2014-10-07 DIAGNOSIS — R062 Wheezing: Secondary | ICD-10-CM | POA: Diagnosis not present

## 2014-10-07 DIAGNOSIS — J40 Bronchitis, not specified as acute or chronic: Secondary | ICD-10-CM

## 2014-10-07 MED ORDER — BENZONATATE 100 MG PO CAPS: 100.0000 mg | ORAL_CAPSULE | Freq: Three times a day (TID) | ORAL | Status: DC | PRN

## 2014-10-07 MED ORDER — ALBUTEROL SULFATE HFA 108 (90 BASE) MCG/ACT IN AERS: 1.0000 | INHALATION_SPRAY | Freq: Four times a day (QID) | RESPIRATORY_TRACT | Status: DC | PRN

## 2014-10-07 MED ORDER — PREDNISONE 10 MG PO TABS
ORAL_TABLET | ORAL | Status: DC
Start: 2014-10-07 — End: 2014-12-14

## 2014-10-07 NOTE — ED Provider Notes (Signed)
CSN: [id number]     Arrival date & time 10/07/14  8938 History   First MD Initiated Contact with Patient 10/07/14 [phone]     Chief Complaint  Patient presents with  . Cough  . Nasal Congestion   (Consider location/radiation/quality/duration/timing/severity/associated sxs/prior Treatment) Patient is a 69 y.o. male presenting with URI. The history is provided by the patient.  URI Presenting symptoms: congestion, cough, fatigue and sore throat   Presenting symptoms: no ear pain, no facial pain, no fever and no rhinorrhea   Severity:  Moderate Onset quality:  Gradual Duration:  1 week Progression:  Unchanged Chronicity:  New Associated symptoms: wheezing   Associated symptoms comment:  +nasal congestion  Risk factors: chronic cardiac disease   Risk factors comment:  +heavy smoker   Past Medical History  Diagnosis Date  . Hypertension   . Hyperlipidemia   . 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 Angioplasty    . Cardiac catheterization     Family History  Problem Relation Age of Onset  . Stroke Father   . Alzheimer's disease Father   . High blood pressure Father   . Heart Problems Mother    History  Substance Use Topics  . Smoking status: Current Every Day Smoker -- 1.00 packs/day    Types: Cigarettes  . Smokeless tobacco: Never Used     Comment: states new years resolution is to quit smoking  . Alcohol Use: No    Review of Systems  Constitutional: Positive for fatigue. Negative for fever.  HENT: Positive for congestion, sore throat and voice change. Negative for ear pain and rhinorrhea.   Eyes: Negative.   Respiratory: Positive for cough and wheezing. Negative for chest tightness and shortness of breath.   Cardiovascular: Negative.   Gastrointestinal: Negative.   Musculoskeletal: Negative.   Skin: Negative.     Allergies  Review of patient's allergies indicates no known allergies.  Home Medications   Prior to  Admission medications   Medication Sig Start Date End Date Taking? Authorizing Provider  albuterol (PROVENTIL HFA;VENTOLIN HFA) 108 (90 BASE) MCG/ACT inhaler Inhale 1-2 puffs into the lungs every 6 (six) hours as needed for wheezing or shortness of breath. Or persistent cough 10/07/14   Lutricia Feil, PA  aspirin 325 MG EC tablet Take 1 tablet (325 mg total) by mouth daily. 03/09/14   Lorayne Marek, MD  atorvastatin (LIPITOR) 40 MG tablet Take 1 tablet (40 mg total) by mouth at bedtime. 08/22/14   Lorayne Marek, MD  benzonatate (TESSALON) 100 MG capsule Take 1 capsule (100 mg total) by mouth 3 (three) times daily as needed for cough. 10/07/14   Audelia Hives Ruger Saxer, PA  carvedilol (COREG) 6.25 MG tablet Take 1 tablet (6.25 mg total) by mouth 2 (two) times daily with a meal. 08/22/14   Lorayne Marek, MD  cetirizine (ZYRTEC) 10 MG tablet Take 1 tablet (10 mg total) by mouth daily. 05/05/14   Lorayne Marek, MD  clopidogrel (PLAVIX) 75 MG tablet Take 1 tablet (75 mg total) by mouth daily. 08/11/14   Lorayne Marek, MD  cyclobenzaprine (FLEXERIL) 10 MG tablet Take 1 tablet (10 mg total) by mouth 3 (three) times daily as needed for muscle spasms. 06/06/14   Lorayne Marek, MD  famotidine (PEPCID) 20 MG tablet Take 1 tablet (20 mg total) by mouth 2 (two) times daily. 08/22/14   Lorayne Marek, MD  fish oil-omega-3 fatty acids 1000  MG capsule Take 1 g by mouth 2 (two) times daily.     Historical Provider, MD  fluticasone (FLONASE) 50 MCG/ACT nasal spray Place 2 sprays into both nostrils daily. 05/05/14   Lorayne Marek, MD  guaiFENesin-dextromethorphan (ROBITUSSIN DM) 100-10 MG/5ML syrup Take 5 mLs by mouth every 4 (four) hours as needed for cough. 05/05/14   Lorayne Marek, MD  nicotine polacrilex (NICORETTE) 4 MG gum Take 1 each (4 mg total) by mouth as needed for smoking cessation. 06/01/14   Lorayne Marek, MD  predniSONE (DELTASONE) 10 MG tablet Take 5 tabs po QD day, 1, 4 tabs po QD day 2, 3 tabs po QD day  3, 2 tabs po QD day 4, 1 tab po QD day 5 then stop 10/07/14   Annett Gula H Parrish Bonn, PA   BP 129/70 mmHg  Pulse 62  Temp(Src) 97.8 F (36.6 C) (Oral)  Resp 18  SpO2 98% Physical Exam  Constitutional: He is oriented to person, place, and time. He appears well-developed and well-nourished. No distress.  HENT:  Head: Normocephalic and atraumatic.  Right Ear: Hearing, external ear and ear canal normal.  Left Ear: Hearing, external ear and ear canal normal.  Nose: Nose normal.  Mouth/Throat: Uvula is midline, oropharynx is clear and moist and mucous membranes are normal.  Both TMs obstructed by cerumen  Eyes: Conjunctivae are normal. No scleral icterus.  Neck: Normal range of motion. Neck supple.  Cardiovascular: Normal rate, regular rhythm and normal heart sounds.   Pulmonary/Chest: Effort normal and breath sounds normal. No respiratory distress. He has no wheezes.  Musculoskeletal: Normal range of motion.  Lymphadenopathy:    He has no cervical adenopathy.  Neurological: He is alert and oriented to person, place, and time.  Skin: Skin is warm and dry.  Psychiatric: He has a normal mood and affect. His behavior is normal.  Nursing note and vitals reviewed.   ED Course  Procedures (including critical care time) Labs Review Labs Reviewed - No data to display  Imaging Review Dg Chest 2 View  10/07/2014   CLINICAL DATA:  One week history of wheezing and coughing.  EXAM: CHEST  2 VIEW  COMPARISON:  06/02/2014  FINDINGS: The cardiac silhouette, mediastinal and hilar contours are within normal limits and stable. There is mild tortuosity of the thoracic aorta. The lungs are clear acute process. 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 The bony thorax is intact.  IMPRESSION: No acute cardiopulmonary findings.   Electronically Signed   By: Kalman Jewels M.D.   On: 10/07/2014 09:09     MDM   1. Bronchitis    CXR normal. 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 Will  treat with cough suppressant, inhaled bronchodilator and oral steroid taper and advise follow up with PCP if no improvement.     [place], [place] 10/07/14 [phone]

## 2014-10-07 NOTE — ED Notes (Signed)
Pt states that he has had a bad cough for 5 days. Pt states that he was sick back in December of 2015 and really has not felt any better.

## 2014-10-07 NOTE — Discharge Instructions (Signed)
Chest xray was normal. No pneumonia. Please make an effort to stop smoking. Please use medications as directed and follow up with your primary care doctor if symptoms do not improve.   Upper Respiratory Infection, Adult An upper respiratory infection (URI) is also sometimes known as the common cold. The upper respiratory tract includes the nose, sinuses, throat, trachea, and bronchi. Bronchi are the airways leading to the lungs. Most people improve within 1 week, but symptoms can last up to 2 weeks. A residual cough may last even longer.  CAUSES Many different viruses can infect the tissues lining the upper respiratory tract. The tissues become irritated and inflamed and often become very moist. Mucus production is also common. A cold is contagious. You can easily spread the virus to others by oral contact. This includes kissing, sharing a glass, coughing, or sneezing. Touching your mouth or nose and then touching a surface, which is then touched by another person, can also spread the virus. SYMPTOMS  Symptoms typically develop 1 to 3 days after you come in contact with a cold virus. Symptoms vary from person to person. They may include:  Runny nose.  Sneezing.  Nasal congestion.  Sinus irritation.  Sore throat.  Loss of voice (laryngitis).  Cough.  Fatigue.  Muscle aches.  Loss of appetite.  Headache.  Low-grade fever. DIAGNOSIS  You might diagnose your own cold based on familiar symptoms, since most people get a cold 2 to 3 times a year. Your caregiver can confirm this based on your exam. Most importantly, your caregiver can check that your symptoms are not due to another disease such as strep throat, sinusitis, pneumonia, asthma, or epiglottitis. Blood tests, throat tests, and X-rays are not necessary to diagnose a common cold, but they may sometimes be helpful in excluding other more serious diseases. Your caregiver will decide if any further tests are required. RISKS AND  COMPLICATIONS  You may be at risk for a more severe case of the common cold if you smoke cigarettes, have chronic heart disease (such as heart failure) or lung disease (such as asthma), or if you have a weakened immune system. The very young and very old are also at risk for more serious infections. Bacterial sinusitis, middle ear infections, and bacterial pneumonia can complicate the common cold. The common cold can worsen asthma and chronic obstructive pulmonary disease (COPD). Sometimes, these complications can require emergency medical care and may be life-threatening. PREVENTION  The best way to protect against getting a cold is to practice good hygiene. Avoid oral or hand contact with people with cold symptoms. Wash your hands often if contact occurs. There is no clear evidence that vitamin C, vitamin E, echinacea, or exercise reduces the chance of developing a cold. However, it is always recommended to get plenty of rest and practice good nutrition. TREATMENT  Treatment is directed at relieving symptoms. There is no cure. Antibiotics are not effective, because the infection is caused by a virus, not by bacteria. Treatment may include:  Increased fluid intake. Sports drinks offer valuable electrolytes, sugars, and fluids.  Breathing heated mist or steam (vaporizer or shower).  Eating chicken soup or other clear broths, and maintaining good nutrition.  Getting plenty of rest.  Using gargles or lozenges for comfort.  Controlling fevers with ibuprofen or acetaminophen as directed by your caregiver.  Increasing usage of your inhaler if you have asthma. Zinc gel and zinc lozenges, taken in the first 24 hours of the common cold, can shorten the  duration and lessen the severity of symptoms. Pain medicines may help with fever, muscle aches, and throat pain. A variety of non-prescription medicines are available to treat congestion and runny nose. Your caregiver can make recommendations and may  suggest nasal or lung inhalers for other symptoms.  HOME CARE INSTRUCTIONS   Only take over-the-counter or prescription medicines for pain, discomfort, or fever as directed by your caregiver.  Use a warm mist humidifier or inhale steam from a shower to increase air moisture. This may keep secretions moist and make it easier to breathe.  Drink enough water and fluids to keep your urine clear or pale yellow.  Rest as needed.  Return to work when your temperature has returned to normal or as your caregiver advises. You may need to stay home longer to avoid infecting others. You can also use a face mask and careful hand washing to prevent spread of the virus. SEEK MEDICAL CARE IF:   After the first few days, you feel you are getting worse rather than better.  You need your caregiver's advice about medicines to control symptoms.  You develop chills, worsening shortness of breath, or brown or red sputum. These may be signs of pneumonia.  You develop yellow or brown nasal discharge or pain in the face, especially when you bend forward. These may be signs of sinusitis.  You develop a fever, swollen neck glands, pain with swallowing, or white areas in the back of your throat. These may be signs of strep throat. SEEK IMMEDIATE MEDICAL CARE IF:   You have a fever.  You develop severe or persistent headache, ear pain, sinus pain, or chest pain.  You develop wheezing, a prolonged cough, cough up blood, or have a change in your usual mucus (if you have chronic lung disease).  You develop sore muscles or a stiff neck. Document Released: 02/19/2001 Document Revised: 11/18/2011 Document Reviewed: 12/01/2013 [facility] Patient Information 2015 [place], [place]. This information is not intended to replace advice given to you by your health care provider. Make sure you discuss any questions you have with your health care provider.

## 2014-11-29 ENCOUNTER — Emergency Department (HOSPITAL_COMMUNITY): Payer: Medicare Other

## 2014-11-29 ENCOUNTER — Encounter (HOSPITAL_COMMUNITY): Payer: Self-pay | Admitting: *Deleted

## 2014-11-29 ENCOUNTER — Emergency Department (INDEPENDENT_AMBULATORY_CARE_PROVIDER_SITE_OTHER)
Admission: EM | Admit: 2014-11-29 | Discharge: 2014-11-29 | Disposition: A | Discharge status: Other Acute Inpatient Hospital | Payer: Medicare Other | Source: Home / Self Care

## 2014-11-29 ENCOUNTER — Other Ambulatory Visit: Payer: Self-pay

## 2014-11-29 ENCOUNTER — Emergency Department (HOSPITAL_COMMUNITY)
Admission: EM | Admit: 2014-11-29 | Discharge: 2014-11-29 | Disposition: A | Discharge status: Home / Self Care | Payer: Medicare Other | Attending: Emergency Medicine | Admitting: Emergency Medicine

## 2014-11-29 DIAGNOSIS — Z720 Tobacco use: Secondary | ICD-10-CM | POA: Diagnosis not present

## 2014-11-29 DIAGNOSIS — Z7982 Long term (current) use of aspirin: Secondary | ICD-10-CM | POA: Insufficient documentation

## 2014-11-29 DIAGNOSIS — I252 Old myocardial infarction: Secondary | ICD-10-CM | POA: Insufficient documentation

## 2014-11-29 DIAGNOSIS — R42 Dizziness and giddiness: Secondary | ICD-10-CM | POA: Insufficient documentation

## 2014-11-29 DIAGNOSIS — I10 Essential (primary) hypertension: Secondary | ICD-10-CM | POA: Insufficient documentation

## 2014-11-29 DIAGNOSIS — I639 Cerebral infarction, unspecified: Secondary | ICD-10-CM

## 2014-11-29 DIAGNOSIS — Z9889 Other specified postprocedural states: Secondary | ICD-10-CM | POA: Insufficient documentation

## 2014-11-29 DIAGNOSIS — I6523 Occlusion and stenosis of bilateral carotid arteries: Secondary | ICD-10-CM | POA: Diagnosis not present

## 2014-11-29 DIAGNOSIS — Z7902 Long term (current) use of antithrombotics/antiplatelets: Secondary | ICD-10-CM | POA: Insufficient documentation

## 2014-11-29 DIAGNOSIS — Z9861 Coronary angioplasty status: Secondary | ICD-10-CM | POA: Insufficient documentation

## 2014-11-29 DIAGNOSIS — Z7951 Long term (current) use of inhaled steroids: Secondary | ICD-10-CM | POA: Diagnosis not present

## 2014-11-29 DIAGNOSIS — R079 Chest pain, unspecified: Secondary | ICD-10-CM

## 2014-11-29 DIAGNOSIS — E785 Hyperlipidemia, unspecified: Secondary | ICD-10-CM | POA: Insufficient documentation

## 2014-11-29 DIAGNOSIS — I2510 Atherosclerotic heart disease of native coronary artery without angina pectoris: Secondary | ICD-10-CM

## 2014-11-29 DIAGNOSIS — Z79899 Other long term (current) drug therapy: Secondary | ICD-10-CM | POA: Insufficient documentation

## 2014-11-29 DIAGNOSIS — R0789 Other chest pain: Secondary | ICD-10-CM | POA: Diagnosis not present

## 2014-11-29 DIAGNOSIS — I672 Cerebral atherosclerosis: Secondary | ICD-10-CM | POA: Diagnosis not present

## 2014-11-29 HISTORY — DX: Systemic involvement of connective tissue, unspecified: M35.9

## 2014-11-29 LAB — BASIC METABOLIC PANEL
Anion gap: 7 (ref 5–15)
BUN: 10 mg/dL (ref 6–23)
CALCIUM: 9.2 mg/dL (ref 8.4–10.5)
CO2: 25 mmol/L (ref 19–32)
Chloride: 109 mmol/L (ref 96–112)
Creatinine, Ser: 1 mg/dL (ref 0.50–1.35)
GFR calc non Af Amer: 76 mL/min — ABNORMAL LOW (ref >90)
GFR, EST AFRICAN AMERICAN: 89 mL/min — AB (ref >90)
Glucose, Bld: 112 mg/dL — ABNORMAL HIGH (ref 70–99)
Potassium: 4 mmol/L (ref 3.5–5.1)
SODIUM: 141 mmol/L (ref 135–145)

## 2014-11-29 LAB — CBC WITH DIFFERENTIAL/PLATELET
BASOS PCT: 0 % (ref 0–1)
Basophils Absolute: 0 10*3/uL (ref 0.0–0.1)
EOS ABS: 0.1 10*3/uL (ref 0.0–0.7)
Eosinophils Relative: 1 % (ref 0–5)
HCT: 45.3 % (ref 39.0–52.0)
HEMOGLOBIN: 15.1 g/dL (ref 13.0–17.0)
Lymphocytes Relative: 20 % (ref 12–46)
Lymphs Abs: 1.5 10*3/uL (ref 0.7–4.0)
MCH: 30.9 pg (ref 26.0–34.0)
MCHC: 33.3 g/dL (ref 30.0–36.0)
MCV: 92.8 fL (ref 78.0–100.0)
MONO ABS: 0.4 10*3/uL (ref 0.1–1.0)
MONOS PCT: 6 % (ref 3–12)
NEUTROS PCT: 73 % (ref 43–77)
Neutro Abs: 5.5 10*3/uL (ref 1.7–7.7)
PLATELETS: 135 10*3/uL — AB (ref 150–400)
RBC: 4.88 MIL/uL (ref 4.22–5.81)
RDW: 12.9 % (ref 11.5–15.5)
WBC: 7.5 10*3/uL (ref 4.0–10.5)

## 2014-11-29 LAB — TROPONIN I

## 2014-11-29 LAB — BRAIN NATRIURETIC PEPTIDE: B Natriuretic Peptide: 437.7 pg/mL — ABNORMAL HIGH (ref 0.0–100.0)

## 2014-11-29 MED ORDER — MECLIZINE HCL 25 MG PO TABS: 25.0000 mg | ORAL_TABLET | Freq: Three times a day (TID) | ORAL | Status: DC | PRN

## 2014-11-29 MED ORDER — IOHEXOL 350 MG/ML SOLN
80.0000 mL | Freq: Once | INTRAVENOUS | Status: AC | PRN
  Administered 2014-11-29: 80 mL via INTRAVENOUS

## 2014-11-29 NOTE — ED Provider Notes (Signed)
CSN: [id number]     Arrival date & time 11/29/14  1302 History   First MD Initiated Contact with Patient 11/29/14 1303     Chief Complaint  Patient presents with  . Chest Pain     (Consider location/radiation/quality/duration/timing/severity/associated sxs/prior Treatment) Patient is a 69 y.o. male presenting with chest pain and dizziness.  Chest Pain Pain location:  L chest Pain quality: sharp   Radiates to: left chest and around to back. Pain severity:  Moderate Onset quality:  Gradual Duration:  2 days Timing:  Intermittent Progression:  Unchanged Chronicity:  New Context comment:  Spontaneous Relieved by:  Nothing Exacerbated by: Standing up, not worse with exertion. Associated symptoms: dizziness   Associated symptoms: no nausea, no shortness of breath and not vomiting   Dizziness Quality:  Room spinning and lightheadedness Severity:  Severe Onset quality:  Gradual Duration:  2 days Timing:  Intermittent Progression:  Unchanged Chronicity:  Recurrent (similar symptoms about a year ago) Context comment:  Turning head or standing up. Relieved by:  Being still and lying down Worsened by:  Standing up and turning head Associated symptoms: chest pain   Associated symptoms: no nausea, no shortness of breath and no vomiting     Past Medical History  Diagnosis Date  . Hypertension   . Hyperlipidemia   . 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 Angioplasty    . Cardiac catheterization     Family History  Problem Relation Age of Onset  . Stroke Father   . Alzheimer's disease Father   . High blood pressure Father   . Heart Problems Mother    History  Substance Use Topics  . Smoking status: Current Every Day Smoker -- 0.50 packs/day    Types: Cigarettes  . Smokeless tobacco: Never Used     Comment: states new years resolution is to quit smoking  . Alcohol Use: No    Review of Systems  Respiratory: Negative for  shortness of breath.   Cardiovascular: Positive for chest pain.  Gastrointestinal: Negative for nausea and vomiting.  Neurological: Positive for dizziness.  All other systems reviewed and are negative.     Allergies  Review of patient's allergies indicates no known allergies.  Home Medications   Prior to Admission medications   Medication Sig Start Date End Date Taking? Authorizing Provider  albuterol (PROVENTIL HFA;VENTOLIN HFA) 108 (90 BASE) MCG/ACT inhaler Inhale 1-2 puffs into the lungs every 6 (six) hours as needed for wheezing or shortness of breath. Or persistent cough 10/07/14   Lutricia Feil, PA  aspirin 325 MG EC tablet Take 1 tablet (325 mg total) by mouth daily. 03/09/14   Lorayne Marek, MD  atorvastatin (LIPITOR) 40 MG tablet Take 1 tablet (40 mg total) by mouth at bedtime. 08/22/14   Lorayne Marek, MD  benzonatate (TESSALON) 100 MG capsule Take 1 capsule (100 mg total) by mouth 3 (three) times daily as needed for cough. 10/07/14   Audelia Hives Presson, PA  carvedilol (COREG) 6.25 MG tablet Take 1 tablet (6.25 mg total) by mouth 2 (two) times daily with a meal. 08/22/14   Lorayne Marek, MD  cetirizine (ZYRTEC) 10 MG tablet Take 1 tablet (10 mg total) by mouth daily. 05/05/14   Lorayne Marek, MD  clopidogrel (PLAVIX) 75 MG tablet Take 1 tablet (75 mg total) by mouth daily. 08/11/14   Lorayne Marek, MD  cyclobenzaprine (FLEXERIL) 10 MG tablet Take  1 tablet (10 mg total) by mouth 3 (three) times daily as needed for muscle spasms. 06/06/14   Lorayne Marek, MD  famotidine (PEPCID) 20 MG tablet Take 1 tablet (20 mg total) by mouth 2 (two) times daily. 08/22/14   Lorayne Marek, MD  fish oil-omega-3 fatty acids 1000 MG capsule Take 1 g by mouth 2 (two) times daily.     Historical Provider, MD  fluticasone (FLONASE) 50 MCG/ACT nasal spray Place 2 sprays into both nostrils daily. 05/05/14   Lorayne Marek, MD  guaiFENesin-dextromethorphan (ROBITUSSIN DM) 100-10 MG/5ML syrup Take 5 mLs  by mouth every 4 (four) hours as needed for cough. 05/05/14   Lorayne Marek, MD  nicotine polacrilex (NICORETTE) 4 MG gum Take 1 each (4 mg total) by mouth as needed for smoking cessation. 06/01/14   Lorayne Marek, MD  predniSONE (DELTASONE) 10 MG tablet Take 5 tabs po QD day, 1, 4 tabs po QD day 2, 3 tabs po QD day 3, 2 tabs po QD day 4, 1 tab po QD day 5 then stop 10/07/14   Annett Gula H Presson, PA   BP 107/67 mmHg  Pulse 56  Temp(Src) 98.1 F (36.7 C) (Oral)  Resp 13  Ht 5\' 7"  (1.702 m)  Wt 195 lb (88.451 kg)  BMI 30.53 kg/m2  SpO2 99% Physical Exam  Constitutional: He is oriented to person, place, and time. He appears well-developed and well-nourished. No distress.  HENT:  Head: Normocephalic and atraumatic.  Mouth/Throat: Oropharynx is clear and moist.  Eyes: Conjunctivae are normal. Pupils are equal, round, and reactive to light. No scleral icterus.  Neck: Neck supple.  Cardiovascular: Normal rate, regular rhythm, normal heart sounds and intact distal pulses.   No murmur heard. Pulmonary/Chest: Effort normal and breath sounds normal. No stridor. No respiratory distress. He has no wheezes. He has no rales. He exhibits no tenderness.  Abdominal: Soft. He exhibits no distension. There is no tenderness.  Musculoskeletal: Normal range of motion. He exhibits no edema.  Neurological: He is alert and oriented to person, place, and time. He has normal strength. A cranial nerve deficit (trace right facial weakness) is present. No sensory deficit. Coordination normal. GCS eye subscore is 4. GCS verbal subscore is 5. GCS motor subscore is 6.  Skin: Skin is warm and dry. No rash noted.  Psychiatric: He has a normal mood and affect. His behavior is normal.  Nursing note and vitals reviewed.   ED Course  Procedures (including critical care time) Labs Review Labs Reviewed - No data to display  Imaging Review  Dg Chest 2 View  11/29/2014   CLINICAL DATA:  Dizziness.  Left chest pain  yesterday.  EXAM: CHEST  2 VIEW  COMPARISON:  10/07/2014  FINDINGS: Airway thickening is present, suggesting bronchitis or reactive airways disease. Thoracic spondylosis. The patient is rotated to the right on today's radiograph, reducing diagnostic sensitivity and specificity. No pleural effusion. No discrete airspace opacity.  IMPRESSION: 1. Airway thickening is present, suggesting bronchitis or reactive airways disease.   Electronically Signed   By: Van Clines M.D.   On: 11/29/2014 15:31     EKG Interpretation   Date/Time:  Tuesday November 29 2014 13:08:00 EDT Ventricular Rate:  57 PR Interval:  246 QRS Duration: 85 QT Interval:  407 QTC Calculation: 396 R Axis:   21 Text Interpretation:  Sinus rhythm Prolonged PR interval Probable inferior  infarct, old No significant change was found Confirmed by [facility]  MD,  TREY ([id number]) on  11/29/2014 3:49:15 PM      MDM   Final diagnoses:  Dizziness  Chest pain, unspecified chest pain type    69 yo male presenting from urgent care with atypical chest pain as well as dizziness.  Dizziness described as both room spinning and lightheadedness.    Regarding chest pain, history is atypical for ACS, PE, or dissection.  EKG negative, CXR unremarkable, troponin negative.  No shortness of breath, normal O2 sats, CP not pleuritic.  With negative delta troponin, I think his CP can be further evaluated on outpatient basis.  Regarding his dizziness, there appears to be a positional component and symptoms worse with neck movement.  He also complains of vague neurologic symptoms.  Neuro exam without definitive deficits but with slight facial asymmetry.  CT/CTA and neuro consult planned.  Care transferred to Dr. Reather Converse.      Serita Grit, MD 11/30/14 [id number]

## 2014-11-29 NOTE — ED Notes (Signed)
pT   STATES  THE  R  SIDE  OF  HIS  FACE  FEELS  LIKE      IT IS  NOT  GETTING  ANY  BLOOD      HE  ALSO  STATES  HE  FEELS  DIZZY      SYMTOMS  STARTED  YESTERDAY   Pt  Reports  l  Sided  Chest  Pain  Going down his l  Arm  As   Well with  The    Onset     Beginning      Yesterday         Pt  Is  Awake  And   Alert as  Well as  Oriented

## 2014-11-29 NOTE — ED Notes (Signed)
[facility]- Pt coming from [facility] with chest pain. HR reported to be between 120-250 at urgent care but 64 with EMS. HR at 57 on arrival. 1 nitro given in route. BP 130/78.

## 2014-11-29 NOTE — ED Notes (Signed)
Patient transported to MRI 

## 2014-11-29 NOTE — ED Notes (Signed)
Pt. Reports having dizziness only when he moves his head back and forth.

## 2014-11-29 NOTE — ED Notes (Signed)
Report given to Becky, RN 

## 2014-11-29 NOTE — ED Notes (Signed)
Iv  Ns  tko  20  Angio   r   Arm    1  Att    Placed  On  Cardiac   Monitor    And   Nasal  o2  At  2 l ./ min

## 2014-11-29 NOTE — ED Provider Notes (Signed)
CSN: [id number]     Arrival date & time 11/29/14  1126 History   None    Chief Complaint  Patient presents with  . Weakness   (Consider location/radiation/quality/duration/timing/severity/associated sxs/prior Treatment) Patient is a 69 y.o. male presenting with weakness. The history is provided by the patient.  Weakness This is a new problem. The current episode started yesterday (feeling that right side of head not getting blood, assoc dizziness with head turn.). The problem has not changed since onset.Associated symptoms include chest pain. Pertinent negatives include no abdominal pain and no shortness of breath. The symptoms are aggravated by smoking.    Past Medical History  Diagnosis Date  . Hypertension   . Hyperlipidemia   . 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 Angioplasty    . Cardiac catheterization     Family History  Problem Relation Age of Onset  . Stroke Father   . Alzheimer's disease Father   . High blood pressure Father   . Heart Problems Mother    History  Substance Use Topics  . Smoking status: Current Every Day Smoker -- 1.00 packs/day    Types: Cigarettes  . Smokeless tobacco: Never Used     Comment: states new years resolution is to quit smoking  . Alcohol Use: No    Review of Systems  HENT: Negative.   Respiratory: Positive for cough. Negative for shortness of breath.   Cardiovascular: Positive for chest pain. Negative for palpitations and leg swelling.  Gastrointestinal: Negative.  Negative for abdominal pain.  Neurological: Positive for weakness.    Allergies  Review of patient's allergies indicates no known allergies.  Home Medications   Prior to Admission medications   Medication Sig Start Date End Date Taking? Authorizing Provider  albuterol (PROVENTIL HFA;VENTOLIN HFA) 108 (90 BASE) MCG/ACT inhaler Inhale 1-2 puffs into the lungs every 6 (six) hours as needed for wheezing or shortness of  breath. Or persistent cough 10/07/14   Lutricia Feil, PA  aspirin 325 MG EC tablet Take 1 tablet (325 mg total) by mouth daily. 03/09/14   Lorayne Marek, MD  atorvastatin (LIPITOR) 40 MG tablet Take 1 tablet (40 mg total) by mouth at bedtime. 08/22/14   Lorayne Marek, MD  benzonatate (TESSALON) 100 MG capsule Take 1 capsule (100 mg total) by mouth 3 (three) times daily as needed for cough. 10/07/14   Audelia Hives Presson, PA  carvedilol (COREG) 6.25 MG tablet Take 1 tablet (6.25 mg total) by mouth 2 (two) times daily with a meal. 08/22/14   Lorayne Marek, MD  cetirizine (ZYRTEC) 10 MG tablet Take 1 tablet (10 mg total) by mouth daily. 05/05/14   Lorayne Marek, MD  clopidogrel (PLAVIX) 75 MG tablet Take 1 tablet (75 mg total) by mouth daily. 08/11/14   Lorayne Marek, MD  cyclobenzaprine (FLEXERIL) 10 MG tablet Take 1 tablet (10 mg total) by mouth 3 (three) times daily as needed for muscle spasms. 06/06/14   Lorayne Marek, MD  famotidine (PEPCID) 20 MG tablet Take 1 tablet (20 mg total) by mouth 2 (two) times daily. 08/22/14   Lorayne Marek, MD  fish oil-omega-3 fatty acids 1000 MG capsule Take 1 g by mouth 2 (two) times daily.     Historical Provider, MD  fluticasone (FLONASE) 50 MCG/ACT nasal spray Place 2 sprays into both nostrils daily. 05/05/14   Lorayne Marek, MD  guaiFENesin-dextromethorphan (ROBITUSSIN DM) 100-10 MG/5ML syrup Take 5  mLs by mouth every 4 (four) hours as needed for cough. 05/05/14   Lorayne Marek, MD  nicotine polacrilex (NICORETTE) 4 MG gum Take 1 each (4 mg total) by mouth as needed for smoking cessation. 06/01/14   Lorayne Marek, MD  predniSONE (DELTASONE) 10 MG tablet Take 5 tabs po QD day, 1, 4 tabs po QD day 2, 3 tabs po QD day 3, 2 tabs po QD day 4, 1 tab po QD day 5 then stop 10/07/14   Annett Gula H Presson, PA   BP 130/74 mmHg  Pulse 74  Temp(Src) 98.6 F (37 C) (Oral)  Resp 18  SpO2 98% Physical Exam  Constitutional: He is oriented to person, place, and time. He  appears well-developed and well-nourished. No distress.  HENT:  Right Ear: External ear normal.  Left Ear: External ear normal.  Mouth/Throat: Oropharynx is clear and moist.  Eyes: Conjunctivae are normal. Pupils are equal, round, and reactive to light.  Neck: Normal range of motion. Neck supple.  Cardiovascular: Normal rate, regular rhythm, S1 normal, S2 normal and normal heart sounds.   Pulses:      Carotid pulses are 1+ on the right side, and 0 on the left side. No carotid bruit, decr pulse to palp.  Pulmonary/Chest: He has decreased breath sounds. He exhibits tenderness.  Lymphadenopathy:    He has no cervical adenopathy.  Neurological: He is alert and oriented to person, place, and time.  Skin: Skin is warm and dry.  Nursing note and vitals reviewed.   ED Course  Procedures (including critical care time) Labs Review Labs Reviewed - No data to display [id number]-[id number] first degree av block, q 2,3,f. No acute changes Imaging Review No results found.   MDM   1. ASCVD (arteriosclerotic cardiovascular disease)    Known right carotid stenosis, c/o dizziness and right facial numbness, chest wall pain on left, ecg no acute changes, sent for further eval    Billy Fischer, MD 11/29/14 1204

## 2014-11-29 NOTE — ED Notes (Signed)
Neurology PA at the bedside.

## 2014-11-29 NOTE — ED Provider Notes (Addendum)
Patient signed out with plan a follow-up neurology recommendations. Neurology recommended MRI. MRI results reviewed no acute findings. Patient normal neuro exam in ER with intermittent dizziness. Patient to follow-up outpatient with primary care doctor. Troponin negative. Likely peripheral vertigo Dizziness Ct Angio Head W/cm &/or Wo Cm  11/29/2014   CLINICAL DATA:  Patient feels that the "RIGHT-side of head is not getting blood", with dizziness associated with head turning. Symptoms began yesterday. Associated symptoms of chest pain. There is a reported RIGHT carotid stenosis although this diagnosis is not supported by prior MRA of the neck.  EXAM: CT ANGIOGRAPHY HEAD AND NECK  TECHNIQUE: Multidetector CT imaging of the head and neck was performed using the standard protocol during bolus administration of intravenous contrast. Multiplanar CT image reconstructions and MIPs were obtained to evaluate the vascular anatomy. Carotid stenosis measurements (when applicable) are obtained utilizing NASCET criteria, using the distal internal carotid diameter as the denominator.  CONTRAST:  11mL OMNIPAQUE IOHEXOL 350 MG/ML SOLN  COMPARISON:  MRI brain and MRA of the extracranial and intracranial circulation 08/23/2013.  FINDINGS: CT HEAD  Calvarium and skull base: No fracture or destructive lesion. Mastoids and middle ears are grossly clear.  Paranasal sinuses: Imaged portions are clear.  Orbits: Negative.  Brain: No evidence of acute abnormality, including acute infarct, hemorrhage, hydrocephalus, or mass lesion.  CTA NECK  Aortic arch: Standard branching. Imaged portion shows no evidence of aneurysm or dissection. No significant stenosis of the major arch vessel origins.  Right carotid system: Minor plaque at the bifurcation. No evidence of dissection, stenosis (50% or greater) or occlusion.  Left carotid system: Minor plaque above the bulb. No evidence of dissection, stenosis (50% or greater) or occlusion.  Vertebral  arteries: LEFT dominant. No evidence of dissection, stenosis (50% or greater) or occlusion.  Soft Tissues:  No abnormal finding.  CTA HEAD  Anterior circulation: No significant stenosis, proximal occlusion, aneurysm, or vascular malformation.  Posterior circulation: No significant stenosis, proximal occlusion, aneurysm, or vascular malformation.  Venous sinuses: As permitted by contrast timing, patent.  Anatomic variants: Fetal LEFT PCA results in slight basilar hypoplasia. RIGHT vertebral predominant contribution is to PICA. Rudimentary basilar connection.  Delayed phase:   No abnormal intracranial enhancement.  IMPRESSION: Minor extracranial atheromatous change. No extracranial flow reducing lesion is demonstrated.  No acute intracranial abnormality is detected. No intracranial vascular stenosis, or occlusion.  Good general agreement with prior MRI brain and MR vascular imaging from 2014.   Electronically Signed   By: Rolla Flatten M.D.   On: 11/29/2014 16:53   Dg Chest 2 View  11/29/2014   CLINICAL DATA:  Dizziness.  Left chest pain yesterday.  EXAM: CHEST  2 VIEW  COMPARISON:  10/07/2014  FINDINGS: Airway thickening is present, suggesting bronchitis or reactive airways disease. Thoracic spondylosis. The patient is rotated to the right on today's radiograph, reducing diagnostic sensitivity and specificity. No pleural effusion. No discrete airspace opacity.  IMPRESSION: 1. Airway thickening is present, suggesting bronchitis or reactive airways disease.   Electronically Signed   By: Van Clines M.D.   On: 11/29/2014 15:31   Ct Angio Neck W/cm &/or Wo/cm  11/29/2014   CLINICAL DATA:  Patient feels that the "RIGHT-side of head is not getting blood", with dizziness associated with head turning. Symptoms began yesterday. Associated symptoms of chest pain. There is a reported RIGHT carotid stenosis although this diagnosis is not supported by prior MRA of the neck.  EXAM: CT ANGIOGRAPHY HEAD AND NECK  TECHNIQUE: Multidetector CT imaging of the head and neck was performed using the standard protocol during bolus administration of intravenous contrast. Multiplanar CT image reconstructions and MIPs were obtained to evaluate the vascular anatomy. Carotid stenosis measurements (when applicable) are obtained utilizing NASCET criteria, using the distal internal carotid diameter as the denominator.  CONTRAST:  43mL OMNIPAQUE IOHEXOL 350 MG/ML SOLN  COMPARISON:  MRI brain and MRA of the extracranial and intracranial circulation 08/23/2013.  FINDINGS: CT HEAD  Calvarium and skull base: No fracture or destructive lesion. Mastoids and middle ears are grossly clear.  Paranasal sinuses: Imaged portions are clear.  Orbits: Negative.  Brain: No evidence of acute abnormality, including acute infarct, hemorrhage, hydrocephalus, or mass lesion.  CTA NECK  Aortic arch: Standard branching. Imaged portion shows no evidence of aneurysm or dissection. No significant stenosis of the major arch vessel origins.  Right carotid system: Minor plaque at the bifurcation. No evidence of dissection, stenosis (50% or greater) or occlusion.  Left carotid system: Minor plaque above the bulb. No evidence of dissection, stenosis (50% or greater) or occlusion.  Vertebral arteries: LEFT dominant. No evidence of dissection, stenosis (50% or greater) or occlusion.  Soft Tissues:  No abnormal finding.  CTA HEAD  Anterior circulation: No significant stenosis, proximal occlusion, aneurysm, or vascular malformation.  Posterior circulation: No significant stenosis, proximal occlusion, aneurysm, or vascular malformation.  Venous sinuses: As permitted by contrast timing, patent.  Anatomic variants: Fetal LEFT PCA results in slight basilar hypoplasia. RIGHT vertebral predominant contribution is to PICA. Rudimentary basilar connection.  Delayed phase:   No abnormal intracranial enhancement.  IMPRESSION: Minor extracranial atheromatous change. No extracranial flow  reducing lesion is demonstrated.  No acute intracranial abnormality is detected. No intracranial vascular stenosis, or occlusion.  Good general agreement with prior MRI brain and MR vascular imaging from 2014.   Electronically Signed   By: Rolla Flatten M.D.   On: 11/29/2014 16:53   Mr Brain Wo Contrast  11/29/2014   CLINICAL DATA:  Patient feels that the "RIGHT-side of head is not getting blood", with dizziness associated with head turning. Two day history of positional vertigo with no objective neurologic findings.  EXAM: MRI HEAD WITHOUT CONTRAST  TECHNIQUE: Multiplanar, multiecho pulse sequences of the brain and surrounding structures were obtained without intravenous contrast.  COMPARISON:  Negative CTA of the intracranial and extracranial circulation earlier today.  FINDINGS: No evidence for acute infarction, hemorrhage, mass lesion, hydrocephalus, or extra-axial fluid. Normal for age cerebral volume. Mild subcortical and periventricular T2 and FLAIR hyperintensities, likely chronic microvascular ischemic change. Flow voids are maintained throughout the carotid, basilar, and vertebral arteries. There are no areas of chronic hemorrhage. Partial empty sella. No tonsillar herniation. Visualized calvarium, skull base, and upper cervical osseous structures unremarkable. Scalp and extracranial soft tissues, orbits, sinuses, and mastoids show no acute process.  IMPRESSION: No acute intracranial findings. Specifically no evidence for brainstem stroke.   Electronically Signed   By: Rolla Flatten M.D.   On: 11/29/2014 20:17    Elnora Morrison, MD 11/29/14 1638  Elnora Morrison, MD 11/29/14 2154

## 2014-11-29 NOTE — Consult Note (Signed)
NEURO HOSPITALIST CONSULT NOTE    Reason for Consult: vertigo and right facial decreased sensation.   HPI:                                                                                                                                          Samuel Lawson is an 69 y.o. male who states he has had intermittent times in the past where he will feel the room is spinning and has a "hissing" sound in his right ear.  He has been seen by a ENT in the past but states he was not diagnosed with anything.  Over the last two days he has noted a subjective feeling of decreased sensation on the right side of his face, a sensation of the room spinning when he moves his head and some right neck discomfort.  He notes that if he remains still he feels the sensation of movement stops. He also feels he has a mild HA on the right.  Due to these symptoms not resolving he came to the ED. He denies diplopia, but does endorse a sensation of his vision coming and going at times.  He denies any lateralizing weakness.   Past Medical History  Diagnosis Date  . Hypertension   . Hyperlipidemia   . Acute MI 2009    STENTS PLACED  . Collagen vascular disease     Past Surgical History  Procedure Laterality Date  . Angioplasty    . Cardiac catheterization      Family History  Problem Relation Age of Onset  . Stroke Father   . Alzheimer's disease Father   . High blood pressure Father   . Heart Problems Mother      Social History:  reports that he has been smoking Cigarettes.  He has been smoking about 0.50 packs per day. He has never used smokeless tobacco. He reports that he does not drink alcohol or use illicit drugs.  No Known Allergies  MEDICATIONS:                                                                                                                     No current facility-administered medications for this encounter.   Current Outpatient Prescriptions  Medication Sig  Dispense Refill  . aspirin  325 MG EC tablet Take 1 tablet (325 mg total) by mouth daily. 90 tablet 2  . atorvastatin (LIPITOR) 40 MG tablet Take 1 tablet (40 mg total) by mouth at bedtime. 30 tablet 2  . carvedilol (COREG) 6.25 MG tablet Take 1 tablet (6.25 mg total) by mouth 2 (two) times daily with a meal. 30 tablet 2  . clopidogrel (PLAVIX) 75 MG tablet Take 1 tablet (75 mg total) by mouth daily. 30 tablet 2  . famotidine (PEPCID) 20 MG tablet Take 1 tablet (20 mg total) by mouth 2 (two) times daily. 60 tablet 2  . fish oil-omega-3 fatty acids 1000 MG capsule Take 1 g by mouth 2 (two) times daily.     . nicotine polacrilex (NICORETTE) 4 MG gum Take 1 each (4 mg total) by mouth as needed for smoking cessation. 100 tablet 0  . albuterol (PROVENTIL HFA;VENTOLIN HFA) 108 (90 BASE) MCG/ACT inhaler Inhale 1-2 puffs into the lungs every 6 (six) hours as needed for wheezing or shortness of breath. Or persistent cough 1 Inhaler 0  . benzonatate (TESSALON) 100 MG capsule Take 1 capsule (100 mg total) by mouth 3 (three) times daily as needed for cough. 21 capsule 0  . cetirizine (ZYRTEC) 10 MG tablet Take 1 tablet (10 mg total) by mouth daily. 30 tablet 1  . cyclobenzaprine (FLEXERIL) 10 MG tablet Take 1 tablet (10 mg total) by mouth 3 (three) times daily as needed for muscle spasms. 30 tablet 0  . fluticasone (FLONASE) 50 MCG/ACT nasal spray Place 2 sprays into both nostrils daily. 16 g 6  . guaiFENesin-dextromethorphan (ROBITUSSIN DM) 100-10 MG/5ML syrup Take 5 mLs by mouth every 4 (four) hours as needed for cough. 118 mL 0  . predniSONE (DELTASONE) 10 MG tablet Take 5 tabs po QD day, 1, 4 tabs po QD day 2, 3 tabs po QD day 3, 2 tabs po QD day 4, 1 tab po QD day 5 then stop 15 tablet 0  . [DISCONTINUED] promethazine (PHENERGAN) 25 MG tablet Take 1 tablet (25 mg total) by mouth every 6 (six) hours as needed for nausea. 20 tablet 0      ROS:                                                                                                                                        History obtained from the patient  General ROS: negative for - chills, fatigue, fever, night sweats, weight gain or weight loss Psychological ROS: negative for - behavioral disorder, hallucinations, memory difficulties, mood swings or suicidal ideation Ophthalmic ROS: negative for - blurry vision, double vision, eye pain or loss of vision ENT ROS: positive for -  vertigo Allergy and Immunology ROS: negative for - hives or itchy/watery eyes Hematological and Lymphatic ROS: negative for - bleeding problems, bruising or swollen lymph nodes Endocrine ROS: negative for - galactorrhea, hair pattern changes, polydipsia/polyuria  or temperature intolerance Respiratory ROS: negative for - cough, hemoptysis, shortness of breath or wheezing Cardiovascular ROS: negative for - chest pain, dyspnea on exertion, edema or irregular heartbeat Gastrointestinal ROS: negative for - abdominal pain, diarrhea, hematemesis, nausea/vomiting or stool incontinence Genito-Urinary ROS: negative for - dysuria, hematuria, incontinence or urinary frequency/urgency Musculoskeletal ROS: negative for - joint swelling or muscular weakness Neurological ROS: as noted in HPI Dermatological ROS: negative for rash and skin lesion changes   Blood pressure 110/63, pulse 53, temperature 98.1 F (36.7 C), temperature source Oral, resp. rate 15, height 5\' 7"  (1.702 m), weight 88.451 kg (195 lb), SpO2 94 %.   Neurologic Examination:                                                                                                      HEENT-  Normocephalic, no lesions, without obvious abnormality.  Normal external eye and conjunctiva.  Normal TM's bilaterally.  Normal auditory canals and external ears. Normal external nose, mucus membranes and septum.  Normal pharynx. Cardiovascular- regular rate and rhythm, pulses palpable throughout   Lungs- chest clear, no wheezing,  rales, normal symmetric air entry Abdomen- normal findings: bowel sounds normal Extremities- no edema Lymph-no adenopathy palpable Musculoskeletal-no joint tenderness, deformity or swelling Skin-warm and dry, no hyperpigmentation, vitiligo, or suspicious lesions  Neurological Examination Mental Status: Alert, oriented, thought content appropriate.  Speech fluent without evidence of aphasia.  Able to follow 3 step commands without difficulty. Cranial Nerves: II: Discs flat bilaterally; Visual fields grossly normal, pupils equal, round, reactive to light and accommodation III,IV, VI: ptosis not present, extra-ocular motions intact bilaterally V,VII: smile symmetric, facial light touch sensation normal bilaterally VIII: hearing normal bilaterally IX,X: uvula rises symmetrically XI: bilateral shoulder shrug XII: midline tongue extension Motor: Right : Upper extremity   5/5    Left:     Upper extremity   5/5  Lower extremity   5/5     Lower extremity   5/5 Tone and bulk:normal tone throughout; no atrophy noted Sensory: Pinprick and light touch intact throughout, bilaterally Deep Tendon Reflexes: 1+ and symmetric throughout UE and KJ no AJ Plantars: Right: downgoing   Left: downgoing Cerebellar: normal finger-to-nose, and normal heel-to-shin test Gait: not tested due to safety.       Lab Results: Basic Metabolic Panel:  Recent Labs Lab 11/29/14 1431  NA 141  K 4.0  CL 109  CO2 25  GLUCOSE 112*  BUN 10  CREATININE 1.00  CALCIUM 9.2    Liver Function Tests: No results for input(s): AST, ALT, ALKPHOS, BILITOT, PROT, ALBUMIN in the last 168 hours. No results for input(s): LIPASE, AMYLASE in the last 168 hours. No results for input(s): AMMONIA in the last 168 hours.  CBC:  Recent Labs Lab 11/29/14 1431  WBC 7.5  NEUTROABS 5.5  HGB 15.1  HCT 45.3  MCV 92.8  PLT 135*    Cardiac Enzymes:  Recent Labs Lab 11/29/14 1431  TROPONINI <0.03    Lipid Panel: No  results for input(s): CHOL, TRIG, HDL, CHOLHDL, VLDL, LDLCALC in the  last 168 hours.  CBG: No results for input(s): GLUCAP in the last 168 hours.  Microbiology: Results for orders placed or performed during the hospital encounter of 11/14/07  Culture, blood (routine x 2)     Status: None   Collection Time: 11/16/07 10:40 AM  Result Value Ref Range Status   Specimen Description BLOOD LEFT HAND  Final   Special Requests BOTTLES DRAWN AEROBIC ONLY 10CC  Final   Culture NO GROWTH 5 DAYS  Final   Report Status 11/22/2007 FINAL  Final  Culture, blood (routine x 2)     Status: None   Collection Time: 11/16/07 10:45 AM  Result Value Ref Range Status   Specimen Description BLOOD LEFT HAND  Final   Special Requests BOTTLES DRAWN AEROBIC ONLY 10CC  Final   Culture NO GROWTH 5 DAYS  Final   Report Status 11/22/2007 FINAL  Final  Urine culture     Status: None   Collection Time: 11/16/07 11:59 PM  Result Value Ref Range Status   Specimen Description URINE, RANDOM  Final   Special Requests IMMUNE:NORM UT SYMPT:NEG  Final   Colony Count NO GROWTH  Final   Culture NO GROWTH  Final   Report Status 11/18/2007 FINAL  Final    Coagulation Studies: No results for input(s): LABPROT, INR in the last 72 hours.  Imaging: Ct Angio Head W/cm &/or Wo Cm  11/29/2014   CLINICAL DATA:  Patient feels that the "RIGHT-side of head is not getting blood", with dizziness associated with head turning. Symptoms began yesterday. Associated symptoms of chest pain. There is a reported RIGHT carotid stenosis although this diagnosis is not supported by prior MRA of the neck.  EXAM: CT ANGIOGRAPHY HEAD AND NECK  TECHNIQUE: Multidetector CT imaging of the head and neck was performed using the standard protocol during bolus administration of intravenous contrast. Multiplanar CT image reconstructions and MIPs were obtained to evaluate the vascular anatomy. Carotid stenosis measurements (when applicable) are obtained utilizing  NASCET criteria, using the distal internal carotid diameter as the denominator.  CONTRAST:  63mL OMNIPAQUE IOHEXOL 350 MG/ML SOLN  COMPARISON:  MRI brain and MRA of the extracranial and intracranial circulation 08/23/2013.  FINDINGS: CT HEAD  Calvarium and skull base: No fracture or destructive lesion. Mastoids and middle ears are grossly clear.  Paranasal sinuses: Imaged portions are clear.  Orbits: Negative.  Brain: No evidence of acute abnormality, including acute infarct, hemorrhage, hydrocephalus, or mass lesion.  CTA NECK  Aortic arch: Standard branching. Imaged portion shows no evidence of aneurysm or dissection. No significant stenosis of the major arch vessel origins.  Right carotid system: Minor plaque at the bifurcation. No evidence of dissection, stenosis (50% or greater) or occlusion.  Left carotid system: Minor plaque above the bulb. No evidence of dissection, stenosis (50% or greater) or occlusion.  Vertebral arteries: LEFT dominant. No evidence of dissection, stenosis (50% or greater) or occlusion.  Soft Tissues:  No abnormal finding.  CTA HEAD  Anterior circulation: No significant stenosis, proximal occlusion, aneurysm, or vascular malformation.  Posterior circulation: No significant stenosis, proximal occlusion, aneurysm, or vascular malformation.  Venous sinuses: As permitted by contrast timing, patent.  Anatomic variants: Fetal LEFT PCA results in slight basilar hypoplasia. RIGHT vertebral predominant contribution is to PICA. Rudimentary basilar connection.  Delayed phase:   No abnormal intracranial enhancement.  IMPRESSION: Minor extracranial atheromatous change. No extracranial flow reducing lesion is demonstrated.  No acute intracranial abnormality is detected. No intracranial vascular stenosis, or occlusion.  Good general agreement with prior MRI brain and MR vascular imaging from 2014.   Electronically Signed   By: Rolla Flatten M.D.   On: 11/29/2014 16:53   Dg Chest 2 View  11/29/2014    CLINICAL DATA:  Dizziness.  Left chest pain yesterday.  EXAM: CHEST  2 VIEW  COMPARISON:  10/07/2014  FINDINGS: Airway thickening is present, suggesting bronchitis or reactive airways disease. Thoracic spondylosis. The patient is rotated to the right on today's radiograph, reducing diagnostic sensitivity and specificity. No pleural effusion. No discrete airspace opacity.  IMPRESSION: 1. Airway thickening is present, suggesting bronchitis or reactive airways disease.   Electronically Signed   By: Van Clines M.D.   On: 11/29/2014 15:31   Ct Angio Neck W/cm &/or Wo/cm  11/29/2014   CLINICAL DATA:  Patient feels that the "RIGHT-side of head is not getting blood", with dizziness associated with head turning. Symptoms began yesterday. Associated symptoms of chest pain. There is a reported RIGHT carotid stenosis although this diagnosis is not supported by prior MRA of the neck.  EXAM: CT ANGIOGRAPHY HEAD AND NECK  TECHNIQUE: Multidetector CT imaging of the head and neck was performed using the standard protocol during bolus administration of intravenous contrast. Multiplanar CT image reconstructions and MIPs were obtained to evaluate the vascular anatomy. Carotid stenosis measurements (when applicable) are obtained utilizing NASCET criteria, using the distal internal carotid diameter as the denominator.  CONTRAST:  42mL OMNIPAQUE IOHEXOL 350 MG/ML SOLN  COMPARISON:  MRI brain and MRA of the extracranial and intracranial circulation 08/23/2013.  FINDINGS: CT HEAD  Calvarium and skull base: No fracture or destructive lesion. Mastoids and middle ears are grossly clear.  Paranasal sinuses: Imaged portions are clear.  Orbits: Negative.  Brain: No evidence of acute abnormality, including acute infarct, hemorrhage, hydrocephalus, or mass lesion.  CTA NECK  Aortic arch: Standard branching. Imaged portion shows no evidence of aneurysm or dissection. No significant stenosis of the major arch vessel origins.  Right carotid  system: Minor plaque at the bifurcation. No evidence of dissection, stenosis (50% or greater) or occlusion.  Left carotid system: Minor plaque above the bulb. No evidence of dissection, stenosis (50% or greater) or occlusion.  Vertebral arteries: LEFT dominant. No evidence of dissection, stenosis (50% or greater) or occlusion.  Soft Tissues:  No abnormal finding.  CTA HEAD  Anterior circulation: No significant stenosis, proximal occlusion, aneurysm, or vascular malformation.  Posterior circulation: No significant stenosis, proximal occlusion, aneurysm, or vascular malformation.  Venous sinuses: As permitted by contrast timing, patent.  Anatomic variants: Fetal LEFT PCA results in slight basilar hypoplasia. RIGHT vertebral predominant contribution is to PICA. Rudimentary basilar connection.  Delayed phase:   No abnormal intracranial enhancement.  IMPRESSION: Minor extracranial atheromatous change. No extracranial flow reducing lesion is demonstrated.  No acute intracranial abnormality is detected. No intracranial vascular stenosis, or occlusion.  Good general agreement with prior MRI brain and MR vascular imaging from 2014.   Electronically Signed   By: Rolla Flatten M.D.   On: 11/29/2014 16:53       Assessment and plan per attending neurologist  Etta Quill PA-C [facility] [phone]  11/29/2014, 4:58 PM   Assessment/Plan: 69 YO male presenting with 2 day history of positional vertigo, right neck discomfort and subjective right facial decreased sensation.  CTA head and neck shows no dissection or rate limiting flow. Exam is non-focal with exception of subjective decreased sensation on the right aspect of his face. Description is of peripheral  vertigo, however given his stroke risk factors would obtain MRI brain to rule out CVA.   Recommend: 1) MRI brain WO contrast 2) Meclizine for symptomatic management of vertigo, 25 mg 3 times a day when necessary.  3) Stroke workup with risk  assessment if MRI is positive for acute infarction 4) if no improvement with meclizine an MRI study is unremarkable, recommend admission to [facility] therapy intervention for vestibular rehabilitation  I personally participated in this patient's evaluation and management, including during above clinical impression and management recommendations.  Rush Farmer M.D. [facility] [phone]

## 2014-11-29 NOTE — ED Notes (Signed)
To ED from [facility], via [facility], for eval of dizziness and cp. Pt given 1 nitro by [facility] and pain relieved. IV started in left wrist. Alert on arrival.

## 2014-11-29 NOTE — Discharge Instructions (Signed)
If you were given medicines take as directed.  If you are on coumadin or contraceptives realize their levels and effectiveness is altered by many different medicines.  If you have any reaction (rash, tongues swelling, other) to the medicines stop taking and see a physician.   Please follow up as directed and return to the ER or see a physician for new or worsening symptoms.  Thank you. Filed Vitals:   11/29/14 1830 11/29/14 1931 11/29/14 2030 11/29/14 2100  BP: 101/61  109/56 112/68  Pulse: 52  53 50  Temp:  98.6 F (37 C)    TempSrc:      Resp: 21  23 25   Height:      Weight:      SpO2: 96%  96% 97%

## 2014-11-30 ENCOUNTER — Ambulatory Visit: Payer: Medicare Other | Admitting: Cardiology

## 2014-12-01 ENCOUNTER — Inpatient Hospital Stay: Payer: Medicare Other

## 2014-12-06 ENCOUNTER — Ambulatory Visit: Payer: Medicare Other | Attending: Internal Medicine | Admitting: Internal Medicine

## 2014-12-06 ENCOUNTER — Encounter: Payer: Self-pay | Admitting: Internal Medicine

## 2014-12-06 VITALS — BP 128/80 | HR 76 | Temp 98.0°F | Resp 16 | Wt 207.2 lb

## 2014-12-06 DIAGNOSIS — Z7951 Long term (current) use of inhaled steroids: Secondary | ICD-10-CM | POA: Diagnosis not present

## 2014-12-06 DIAGNOSIS — F17210 Nicotine dependence, cigarettes, uncomplicated: Secondary | ICD-10-CM | POA: Insufficient documentation

## 2014-12-06 DIAGNOSIS — Z7902 Long term (current) use of antithrombotics/antiplatelets: Secondary | ICD-10-CM | POA: Diagnosis not present

## 2014-12-06 DIAGNOSIS — E785 Hyperlipidemia, unspecified: Secondary | ICD-10-CM | POA: Diagnosis not present

## 2014-12-06 DIAGNOSIS — R42 Dizziness and giddiness: Secondary | ICD-10-CM | POA: Diagnosis not present

## 2014-12-06 DIAGNOSIS — F17200 Nicotine dependence, unspecified, uncomplicated: Secondary | ICD-10-CM

## 2014-12-06 DIAGNOSIS — I2510 Atherosclerotic heart disease of native coronary artery without angina pectoris: Secondary | ICD-10-CM

## 2014-12-06 DIAGNOSIS — K219 Gastro-esophageal reflux disease without esophagitis: Secondary | ICD-10-CM | POA: Diagnosis not present

## 2014-12-06 DIAGNOSIS — Z720 Tobacco use: Secondary | ICD-10-CM | POA: Diagnosis not present

## 2014-12-06 DIAGNOSIS — Z955 Presence of coronary angioplasty implant and graft: Secondary | ICD-10-CM | POA: Insufficient documentation

## 2014-12-06 DIAGNOSIS — I252 Old myocardial infarction: Secondary | ICD-10-CM | POA: Insufficient documentation

## 2014-12-06 DIAGNOSIS — Z7982 Long term (current) use of aspirin: Secondary | ICD-10-CM | POA: Insufficient documentation

## 2014-12-06 DIAGNOSIS — I10 Essential (primary) hypertension: Secondary | ICD-10-CM | POA: Diagnosis not present

## 2014-12-06 NOTE — Progress Notes (Signed)
Patient here for follow up from the urgent care Was seen for dizziness and off balance Was given a new prescription for antivert for vertigo

## 2014-12-06 NOTE — Progress Notes (Signed)
MRN: [id number] Name: Samuel Lawson  Sex: male Age: 69 y.o. DOB: [birth year]  Allergies: Review of patient's allergies indicates no known allergies.  Chief Complaint  Patient presents with  . Follow-up    urgent care    HPI: Patient is 69 y.o. male who history of hypertension comes today, GERD, recently went to urgent care/emergency room with symptoms of chest pain,dizziness, EMR reviewed , patient had workup done and was told he has vertigo and was prescribed meclizine as per patient he is taking the medication and reports improvement, currently denies any headache dizziness chest and shortness of breath. Patient still smokes cigarettes, I have counseled patient to quit smoking.  Past Medical History  Diagnosis Date  . Hypertension   . Hyperlipidemia   . Acute MI 2009    STENTS PLACED  . Collagen vascular disease     Past Surgical History  Procedure Laterality Date  . Angioplasty    . Cardiac catheterization        Medication List       This list is accurate as of: 12/06/14 10:59 AM.  Always use your most recent med list.               albuterol 108 (90 BASE) MCG/ACT inhaler  Commonly known as:  PROVENTIL HFA;VENTOLIN HFA  Inhale 1-2 puffs into the lungs every 6 (six) hours as needed for wheezing or shortness of breath. Or persistent cough     aspirin 325 MG EC tablet  Take 1 tablet (325 mg total) by mouth daily.     atorvastatin 40 MG tablet  Commonly known as:  LIPITOR  Take 1 tablet (40 mg total) by mouth at bedtime.     benzonatate 100 MG capsule  Commonly known as:  TESSALON  Take 1 capsule (100 mg total) by mouth 3 (three) times daily as needed for cough.     carvedilol 6.25 MG tablet  Commonly known as:  COREG  Take 1 tablet (6.25 mg total) by mouth 2 (two) times daily with a meal.     cetirizine 10 MG tablet  Commonly known as:  ZYRTEC  Take 1 tablet (10 mg total) by mouth daily.     clopidogrel 75 MG tablet  Commonly known as:  PLAVIX    Take 1 tablet (75 mg total) by mouth daily.     cyclobenzaprine 10 MG tablet  Commonly known as:  FLEXERIL  Take 1 tablet (10 mg total) by mouth 3 (three) times daily as needed for muscle spasms.     famotidine 20 MG tablet  Commonly known as:  PEPCID  Take 1 tablet (20 mg total) by mouth 2 (two) times daily.     fish oil-omega-3 fatty acids 1000 MG capsule  Take 1 g by mouth 2 (two) times daily.     fluticasone 50 MCG/ACT nasal spray  Commonly known as:  FLONASE  Place 2 sprays into both nostrils daily.     guaiFENesin-dextromethorphan 100-10 MG/5ML syrup  Commonly known as:  ROBITUSSIN DM  Take 5 mLs by mouth every 4 (four) hours as needed for cough.     meclizine 25 MG tablet  Commonly known as:  ANTIVERT  Take 1 tablet (25 mg total) by mouth 3 (three) times daily as needed for dizziness.     nicotine polacrilex 4 MG gum  Commonly known as:  NICORETTE  Take 1 each (4 mg total) by mouth as needed for smoking cessation.     predniSONE  10 MG tablet  Commonly known as:  DELTASONE  Take 5 tabs po QD day, 1, 4 tabs po QD day 2, 3 tabs po QD day 3, 2 tabs po QD day 4, 1 tab po QD day 5 then stop        No orders of the defined types were placed in this encounter.    Immunization History  Administered Date(s) Administered  . Influenza,inj,Quad PF,36+ Mos 08/25/2013, 06/01/2014    Family History  Problem Relation Age of Onset  . Stroke Father   . Alzheimer's disease Father   . High blood pressure Father   . Heart Problems Mother     History  Substance Use Topics  . Smoking status: Current Every Day Smoker -- 0.50 packs/day    Types: Cigarettes  . Smokeless tobacco: Never Used     Comment: states new years resolution is to quit smoking  . Alcohol Use: No    Review of Systems   As noted in HPI  Filed Vitals:   12/06/14 0940  BP: 128/80  Pulse: 76  Temp: 98 F (36.7 C)  Resp: 16    Physical Exam  Physical Exam  Constitutional: No distress.   HENT:  Increased wax in right ear  Eyes: EOM are normal. Pupils are equal, round, and reactive to light.  Neck:  No nystagmus  Cardiovascular: Normal rate and regular rhythm.   Pulmonary/Chest: Breath sounds normal. No respiratory distress. He has no wheezes. He has no rales.  Musculoskeletal: He exhibits no edema.    CBC    Component Value Date/Time   WBC 7.5 11/29/2014 1431   RBC 4.88 11/29/2014 1431   HGB 15.1 11/29/2014 1431   HCT 45.3 11/29/2014 1431   PLT 135* 11/29/2014 1431   MCV 92.8 11/29/2014 1431   LYMPHSABS 1.5 11/29/2014 1431   MONOABS 0.4 11/29/2014 1431   EOSABS 0.1 11/29/2014 1431   BASOSABS 0.0 11/29/2014 1431    CMP     Component Value Date/Time   NA 141 11/29/2014 1431   K 4.0 11/29/2014 1431   CL 109 11/29/2014 1431   CO2 25 11/29/2014 1431   GLUCOSE 112* 11/29/2014 1431   BUN 10 11/29/2014 1431   CREATININE 1.00 11/29/2014 1431   CREATININE 1.00 08/22/2014 1717   CALCIUM 9.2 11/29/2014 1431   PROT 7.1 08/22/2014 1717   ALBUMIN 4.4 08/22/2014 1717   AST 15 08/22/2014 1717   ALT 12 08/22/2014 1717   ALKPHOS 65 08/22/2014 1717   BILITOT 0.7 08/22/2014 1717   GFRNONAA 76* 11/29/2014 1431   GFRNONAA 78 08/22/2014 1717   GFRAA 89* 11/29/2014 1431   GFRAA >89 08/22/2014 1717    Lab Results  Component Value Date/Time   CHOL 131 03/09/2014 12:47 PM    No components found for: HGA1C  Lab Results  Component Value Date/Time   AST 15 08/22/2014 05:17 PM    Assessment and Plan  Essential hypertension Pressure is well-controlled continued current meds.  Smoking Advised patient to quit smoking.  Dizziness and giddiness Nonspecific, improved with meclizine, patient is also advised to use Debrox eardrops in the right ear as per patient he has the medication and will start using.   Return in about 3 months (around 03/08/2015) for hypertension.   This note has been created with Automotive engineer. Any transcriptional errors are unintentional.    Lorayne Marek, MD

## 2014-12-07 ENCOUNTER — Ambulatory Visit: Payer: Medicare Other | Admitting: Cardiology

## 2014-12-14 ENCOUNTER — Ambulatory Visit: Payer: Medicare Other | Attending: Cardiology | Admitting: Cardiology

## 2014-12-14 ENCOUNTER — Encounter: Payer: Self-pay | Admitting: Cardiology

## 2014-12-14 VITALS — Temp 97.9°F | Ht 67.0 in | Wt 208.6 lb

## 2014-12-14 DIAGNOSIS — I951 Orthostatic hypotension: Secondary | ICD-10-CM | POA: Diagnosis not present

## 2014-12-14 DIAGNOSIS — Z720 Tobacco use: Secondary | ICD-10-CM

## 2014-12-14 DIAGNOSIS — I10 Essential (primary) hypertension: Secondary | ICD-10-CM | POA: Diagnosis not present

## 2014-12-14 DIAGNOSIS — I2510 Atherosclerotic heart disease of native coronary artery without angina pectoris: Secondary | ICD-10-CM

## 2014-12-14 DIAGNOSIS — E785 Hyperlipidemia, unspecified: Secondary | ICD-10-CM

## 2014-12-14 DIAGNOSIS — E669 Obesity, unspecified: Secondary | ICD-10-CM

## 2014-12-14 MED ORDER — MECLIZINE HCL 25 MG PO TABS: 25.0000 mg | ORAL_TABLET | Freq: Three times a day (TID) | ORAL | Status: DC | PRN

## 2014-12-14 MED ORDER — CARVEDILOL 3.125 MG PO TABS: 3.1250 mg | ORAL_TABLET | Freq: Two times a day (BID) | ORAL | Status: DC

## 2014-12-14 NOTE — Assessment & Plan Note (Signed)
I decreased his carvedilol 3.125 twice a day.

## 2014-12-14 NOTE — Patient Instructions (Addendum)
It was great seeing you again. Your coreg has been DECREASED to 3.125 mg twice daily. Increase water intake. Please return to see Dr. Verl Blalock in one year.

## 2014-12-14 NOTE — Progress Notes (Signed)
Patient experiencing dizziness. Seen in Urgent Care and ED on 11/29/14 for dizziness-dx with vertigo. Patient indicates he has been taking antivert and dizziness has improved. Only experiencing intermittently now. Denies chest pain, shortness of breath, headaches, lower extremity swelling. Orthostatic VS done (positive) and documented in extended vitals. Patient indicates he is still smoking but has cut back from 1 ppd to 0.5 ppd.

## 2014-12-14 NOTE — Progress Notes (Signed)
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  He denies any ischemic symptoms or angina. He has had some vertigo which is been evaluated extensively. No neurovascular cause was found.  He continues to smoke but has cut back to half a pack a day. He is compliant with his medications. LDL is at goal in July 15.  Exam today shows an obese very pleasant humorous white male in no acute distress. He looks much older than stated age. Neck shows no JVD with carotids being full without bruits. Lungs reveal decreased breath sounds throughout with some mild expiratory rhonchi. Heart exam reveals a poorly appreciated PMI. Soft S1-S2. Abdominal exam is soft with no midline bruit. Extremities reveal no cyanosis clubbing or edema. Dorsalis pedis posterior tibial 2+ over 4+ bilaterally.

## 2014-12-14 NOTE — Assessment & Plan Note (Signed)
LDL is at goal. Repeat blood work July 2016.

## 2014-12-14 NOTE — Assessment & Plan Note (Signed)
Encouraged to stop. 

## 2014-12-14 NOTE — Assessment & Plan Note (Signed)
Encouraged to lose weight

## 2014-12-14 NOTE — Assessment & Plan Note (Signed)
Stable. Continue secondary preventative therapy with antiplatelet therapy, statin, beta blocker. I've asked him to stop smoking.

## 2014-12-29 ENCOUNTER — Other Ambulatory Visit: Payer: Self-pay

## 2015-01-06 ENCOUNTER — Encounter (HOSPITAL_COMMUNITY): Payer: Self-pay | Admitting: Emergency Medicine

## 2015-01-06 ENCOUNTER — Emergency Department (INDEPENDENT_AMBULATORY_CARE_PROVIDER_SITE_OTHER)
Admission: EM | Admit: 2015-01-06 | Discharge: 2015-01-06 | Disposition: A | Discharge status: Home / Self Care | Payer: Medicare Other | Source: Home / Self Care | Attending: Family Medicine | Admitting: Family Medicine

## 2015-01-06 DIAGNOSIS — K088 Other specified disorders of teeth and supporting structures: Secondary | ICD-10-CM | POA: Diagnosis not present

## 2015-01-06 DIAGNOSIS — K0889 Other specified disorders of teeth and supporting structures: Secondary | ICD-10-CM

## 2015-01-06 MED ORDER — HYDROCODONE-ACETAMINOPHEN 7.5-325 MG PO TABS
1.0000 | ORAL_TABLET | ORAL | Status: DC | PRN
Start: 2015-01-06 — End: 2015-06-22

## 2015-01-06 MED ORDER — AMOXICILLIN 500 MG PO CAPS: 1000.0000 mg | ORAL_CAPSULE | Freq: Two times a day (BID) | ORAL | Status: DC

## 2015-01-06 NOTE — ED Notes (Signed)
C/o right upper side dental pain onset 2 days Dentist wont be back until next week Sx include swelling; denies fevers, chills Alert, no signs of acute distress.

## 2015-01-06 NOTE — Discharge Instructions (Signed)
Dental Pain °A tooth ache may be caused by cavities (tooth decay). Cavities expose the nerve of the tooth to air and hot or cold temperatures. It may come from an infection or abscess (also called a boil or furuncle) around your tooth. It is also often caused by dental caries (tooth decay). This causes the pain you are having. °DIAGNOSIS  °Your caregiver can diagnose this problem by exam. °TREATMENT  °· If caused by an infection, it may be treated with medications which kill germs (antibiotics) and pain medications as prescribed by your caregiver. Take medications as directed. °· Only take over-the-counter or prescription medicines for pain, discomfort, or fever as directed by your caregiver. °· Whether the tooth ache today is caused by infection or dental disease, you should see your dentist as soon as possible for further care. °SEEK MEDICAL CARE IF: °The exam and treatment you received today has been provided on an emergency basis only. This is not a substitute for complete medical or dental care. If your problem worsens or new problems (symptoms) appear, and you are unable to meet with your dentist, call or return to this location. °SEEK IMMEDIATE MEDICAL CARE IF:  °· You have a fever. °· You develop redness and swelling of your face, jaw, or neck. °· You are unable to open your mouth. °· You have severe pain uncontrolled by pain medicine. °MAKE SURE YOU:  °· Understand these instructions. °· Will watch your condition. °· Will get help right away if you are not doing well or get worse. °Document Released: 08/26/2005 Document Revised: 11/18/2011 Document Reviewed: 04/13/2008 °ExitCare® Patient Information ©2015 [employer]. This information is not intended to replace advice given to you by your health care provider. Make sure you discuss any questions you have with your health care provider. ° °Dental Care and Dentist Visits °Dental care supports good overall health. Regular dental visits can also help you  avoid dental pain, bleeding, infection, and other more serious health problems in the future. It is important to keep the mouth healthy because diseases in the teeth, gums, and other oral tissues can spread to other areas of the body. Some problems, such as diabetes, heart disease, and pre-term labor have been associated with poor oral health.  °See your dentist every 6 months. If you experience emergency problems such as a toothache or broken tooth, go to the dentist right away. If you see your dentist regularly, you may catch problems early. It is easier to be treated for problems in the early stages.  °WHAT TO EXPECT AT A DENTIST VISIT  °Your dentist will look for many common oral health problems and recommend proper treatment. At your regular dental visit, you can expect: °· Gentle cleaning of the teeth and gums. This includes scraping and polishing. This helps to remove the sticky substance around the teeth and gums (plaque). Plaque forms in the mouth shortly after eating. Over time, plaque hardens on the teeth as tartar. If tartar is not removed regularly, it can cause problems. Cleaning also helps remove stains. °· Periodic X-rays. These pictures of the teeth and supporting bone will help your dentist assess the health of your teeth. °· Periodic fluoride treatments. Fluoride is a natural mineral shown to help strengthen teeth. Fluoride treatment involves applying a fluoride gel or varnish to the teeth. It is most commonly done in children. °· Examination of the mouth, tongue, jaws, teeth, and gums to look for any oral health problems, such as: °¨ Cavities (dental caries). This is   decay on the tooth caused by plaque, sugar, and acid in the mouth. It is best to catch a cavity when it is small. °¨ Inflammation of the gums caused by plaque buildup (gingivitis). °¨ Problems with the mouth or malformed or misaligned teeth. °¨ Oral cancer or other diseases of the soft tissues or jaws.  °KEEP YOUR TEETH AND GUMS  HEALTHY °For healthy teeth and gums, follow these general guidelines as well as your dentist's specific advice: °· Have your teeth professionally cleaned at the dentist every 6 months. °· Brush twice daily with a fluoride toothpaste. °· Floss your teeth daily.  °· Ask your dentist if you need fluoride supplements, treatments, or fluoride toothpaste. °· Eat a healthy diet. Reduce foods and drinks with added sugar. °· Avoid smoking. °TREATMENT FOR ORAL HEALTH PROBLEMS °If you have oral health problems, treatment varies depending on the conditions present in your teeth and gums. °· Your caregiver will most likely recommend good oral hygiene at each visit. °· For cavities, gingivitis, or other oral health disease, your caregiver will perform a procedure to treat the problem. This is typically done at a separate appointment. Sometimes your caregiver will refer you to another dental specialist for specific tooth problems or for surgery. °SEEK IMMEDIATE DENTAL CARE IF: °· You have pain, bleeding, or soreness in the gum, tooth, jaw, or mouth area. °· A permanent tooth becomes loose or separated from the gum socket. °· You experience a blow or injury to the mouth or jaw area. °Document Released: 05/08/2011 Document Revised: 11/18/2011 Document Reviewed: 05/08/2011 °ExitCare® Patient Information ©2015 [employer]. This information is not intended to replace advice given to you by your health care provider. Make sure you discuss any questions you have with your health care provider. ° °

## 2015-01-06 NOTE — ED Provider Notes (Signed)
CSN: [id number]     Arrival date & time 01/06/15  1611 History   First MD Initiated Contact with Patient 01/06/15 1720     Chief Complaint  Patient presents with  . Dental Pain   (Consider location/radiation/quality/duration/timing/severity/associated sxs/prior Treatment) HPI Comments: 69 year old man with a two-day history of a toothache. He states that he has this tooth "worked on" by Pharmacist, community about 6 months ago. This apparently included some tooth repair and fillings. He points to the upper right first molar.  Patient is a 69 y.o. male presenting with tooth pain.  Dental Pain Associated symptoms: facial swelling     Past Medical History  Diagnosis Date  . Hypertension   . Hyperlipidemia   . Acute MI 2009    STENTS PLACED  . Collagen vascular disease    Past Surgical History  Procedure Laterality Date  . Angioplasty    . Cardiac catheterization     Family History  Problem Relation Age of Onset  . Stroke Father   . Alzheimer's disease Father   . High blood pressure Father   . Heart Problems Mother    History  Substance Use Topics  . Smoking status: Current Every Day Smoker -- 0.50 packs/day    Types: Cigarettes  . Smokeless tobacco: Never Used     Comment: states new years resolution is to quit smoking  . Alcohol Use: No    Review of Systems  HENT: Positive for dental problem and facial swelling. Negative for ear discharge and sore throat.   All other systems reviewed and are negative.   Allergies  Review of patient's allergies indicates no known allergies.  Home Medications   Prior to Admission medications   Medication Sig Start Date End Date Taking? Authorizing Provider  atorvastatin (LIPITOR) 40 MG tablet Take 1 tablet (40 mg total) by mouth at bedtime. 08/22/14  Yes Lorayne Marek, MD  carvedilol (COREG) 3.125 MG tablet Take 1 tablet (3.125 mg total) by mouth 2 (two) times daily with a meal. 12/14/14  Yes Renella Cunas, MD  famotidine (PEPCID) 20 MG tablet  Take 1 tablet (20 mg total) by mouth 2 (two) times daily. 08/22/14  Yes Lorayne Marek, MD  amoxicillin (AMOXIL) 500 MG capsule Take 2 capsules (1,000 mg total) by mouth 2 (two) times daily. 01/06/15   Janne Napoleon, NP  aspirin 325 MG EC tablet Take 1 tablet (325 mg total) by mouth daily. 03/09/14   Lorayne Marek, MD  cetirizine (ZYRTEC) 10 MG tablet Take 1 tablet (10 mg total) by mouth daily. 05/05/14   Lorayne Marek, MD  clopidogrel (PLAVIX) 75 MG tablet Take 1 tablet (75 mg total) by mouth daily. 08/11/14   Lorayne Marek, MD  fish oil-omega-3 fatty acids 1000 MG capsule Take 1 g by mouth 2 (two) times daily.     Historical Provider, MD  HYDROcodone-acetaminophen (NORCO) 7.5-325 MG per tablet Take 1 tablet by mouth every 4 (four) hours as needed. 01/06/15   Janne Napoleon, NP  meclizine (ANTIVERT) 25 MG tablet Take 1 tablet (25 mg total) by mouth 3 (three) times daily as needed for dizziness. 12/14/14   Renella Cunas, MD   BP 129/79 mmHg  Pulse 64  Temp(Src) 98.7 F (37.1 C) (Oral)  Resp 18  SpO2 96% Physical Exam  Constitutional: He is oriented to person, place, and time. He appears well-developed and well-nourished. No distress.  HENT:  Mouth/Throat: Oropharynx is clear and moist. No oropharyngeal exudate.  Right upper first molar with dental  tenderness. There is a dark necrotic appearing area circumscribing the base at the gumline. Mild swelling of the gumline there is also an area of swelling to the bucca mucosa just opposite of the involved tooth.  Neck: Normal range of motion. Neck supple.  Pulmonary/Chest: Effort normal. No respiratory distress.  Lymphadenopathy:    He has no cervical adenopathy.  Neurological: He is alert and oriented to person, place, and time.  Skin: Skin is warm and dry.  Nursing note and vitals reviewed.   ED Course  Procedures (including critical care time) Labs Review Labs Reviewed - No data to display  Imaging Review No results found.   MDM   1. Pain, dental     norco 7.5 mg #20 Amoxil course See your dentist next week    Janne Napoleon, NP 01/06/15 1731

## 2015-01-09 ENCOUNTER — Other Ambulatory Visit: Payer: Self-pay | Admitting: Internal Medicine

## 2015-01-11 ENCOUNTER — Telehealth: Payer: Self-pay

## 2015-01-11 NOTE — Telephone Encounter (Signed)
Dr Fay Records called about this patient Patient will be having a tooth extracted and wanted authorization To stop the patients plavix 24 hours prior to the extraction than restart immediately After Per Dr Sid Falcon will be fine

## 2015-01-19 ENCOUNTER — Other Ambulatory Visit: Payer: Self-pay | Admitting: Internal Medicine

## 2015-01-24 ENCOUNTER — Telehealth: Payer: Self-pay | Admitting: Internal Medicine

## 2015-01-24 ENCOUNTER — Other Ambulatory Visit: Payer: Self-pay

## 2015-01-24 DIAGNOSIS — K219 Gastro-esophageal reflux disease without esophagitis: Secondary | ICD-10-CM

## 2015-01-24 MED ORDER — FAMOTIDINE 20 MG PO TABS: 20.0000 mg | ORAL_TABLET | Freq: Two times a day (BID) | ORAL | Status: DC

## 2015-01-24 NOTE — Progress Notes (Unsigned)
Patient came into office requesting a refill on his pepcid [facility]

## 2015-01-24 NOTE — Telephone Encounter (Signed)
Patient has come in today to request a medication refill for Pepcid; please f/u with patient

## 2015-03-10 ENCOUNTER — Other Ambulatory Visit: Payer: Self-pay

## 2015-03-10 ENCOUNTER — Other Ambulatory Visit: Payer: Self-pay | Admitting: Internal Medicine

## 2015-03-10 ENCOUNTER — Telehealth: Payer: Self-pay | Admitting: Internal Medicine

## 2015-03-10 DIAGNOSIS — E7800 Pure hypercholesterolemia, unspecified: Secondary | ICD-10-CM

## 2015-03-10 MED ORDER — ATORVASTATIN CALCIUM 40 MG PO TABS: 40.0000 mg | ORAL_TABLET | Freq: Every day | ORAL | Status: DC

## 2015-03-10 NOTE — Telephone Encounter (Signed)
Patient came into office requesting medication refill on atorvastatin (LIPITOR) 40 MG tablet. Please f/u with patient

## 2015-03-24 ENCOUNTER — Other Ambulatory Visit: Payer: Self-pay | Admitting: Internal Medicine

## 2015-04-10 ENCOUNTER — Other Ambulatory Visit: Payer: Self-pay | Admitting: Internal Medicine

## 2015-04-13 NOTE — Telephone Encounter (Signed)
Patient came into office requesting medication refill on clopidogrel (PLAVIX) 75 MG tablet. Please f/u with patient

## 2015-06-09 ENCOUNTER — Other Ambulatory Visit: Payer: Self-pay | Admitting: Internal Medicine

## 2015-06-09 NOTE — Telephone Encounter (Signed)
Patient called and requested a med refill for his atorvastatin (LIPITOR) 40 MG tablet . Please f/u with pt.

## 2015-06-14 ENCOUNTER — Other Ambulatory Visit: Payer: Self-pay

## 2015-06-14 DIAGNOSIS — E7800 Pure hypercholesterolemia, unspecified: Secondary | ICD-10-CM

## 2015-06-14 MED ORDER — ATORVASTATIN CALCIUM 40 MG PO TABS: 40.0000 mg | ORAL_TABLET | Freq: Every day | ORAL | Status: DC

## 2015-06-14 NOTE — Telephone Encounter (Signed)
Patient came in requesting a medication refill for atorvastatin (LIPITOR)  Please follow up.

## 2015-06-22 ENCOUNTER — Ambulatory Visit (HOSPITAL_BASED_OUTPATIENT_CLINIC_OR_DEPARTMENT_OTHER): Payer: Medicare Other | Admitting: Family Medicine

## 2015-06-22 ENCOUNTER — Encounter: Payer: Self-pay | Admitting: Family Medicine

## 2015-06-22 ENCOUNTER — Other Ambulatory Visit: Payer: Self-pay

## 2015-06-22 ENCOUNTER — Ambulatory Visit (HOSPITAL_COMMUNITY)
Admission: RE | Admit: 2015-06-22 | Discharge: 2015-06-22 | Disposition: A | Discharge status: Home / Self Care | Payer: Medicare Other | Source: Ambulatory Visit | Attending: Interventional Cardiology | Admitting: Interventional Cardiology

## 2015-06-22 VITALS — BP 101/66 | HR 59 | Temp 98.1°F | Resp 16 | Ht 67.0 in | Wt 199.0 lb

## 2015-06-22 DIAGNOSIS — F17200 Nicotine dependence, unspecified, uncomplicated: Secondary | ICD-10-CM | POA: Insufficient documentation

## 2015-06-22 DIAGNOSIS — Z0000 Encounter for general adult medical examination without abnormal findings: Secondary | ICD-10-CM

## 2015-06-22 DIAGNOSIS — E785 Hyperlipidemia, unspecified: Secondary | ICD-10-CM | POA: Diagnosis not present

## 2015-06-22 DIAGNOSIS — I2510 Atherosclerotic heart disease of native coronary artery without angina pectoris: Secondary | ICD-10-CM | POA: Diagnosis not present

## 2015-06-22 DIAGNOSIS — K219 Gastro-esophageal reflux disease without esophagitis: Secondary | ICD-10-CM

## 2015-06-22 DIAGNOSIS — I10 Essential (primary) hypertension: Secondary | ICD-10-CM | POA: Diagnosis not present

## 2015-06-22 DIAGNOSIS — R079 Chest pain, unspecified: Secondary | ICD-10-CM | POA: Insufficient documentation

## 2015-06-22 DIAGNOSIS — F17210 Nicotine dependence, cigarettes, uncomplicated: Secondary | ICD-10-CM | POA: Diagnosis not present

## 2015-06-22 DIAGNOSIS — Z955 Presence of coronary angioplasty implant and graft: Secondary | ICD-10-CM | POA: Insufficient documentation

## 2015-06-22 DIAGNOSIS — F17290 Nicotine dependence, other tobacco product, uncomplicated: Secondary | ICD-10-CM | POA: Diagnosis not present

## 2015-06-22 DIAGNOSIS — Z79899 Other long term (current) drug therapy: Secondary | ICD-10-CM | POA: Diagnosis not present

## 2015-06-22 DIAGNOSIS — Z7982 Long term (current) use of aspirin: Secondary | ICD-10-CM | POA: Insufficient documentation

## 2015-06-22 DIAGNOSIS — R072 Precordial pain: Secondary | ICD-10-CM | POA: Diagnosis not present

## 2015-06-22 DIAGNOSIS — E7800 Pure hypercholesterolemia, unspecified: Secondary | ICD-10-CM | POA: Insufficient documentation

## 2015-06-22 MED ORDER — ATORVASTATIN CALCIUM 40 MG PO TABS: 40.0000 mg | ORAL_TABLET | Freq: Every day | ORAL | Status: DC

## 2015-06-22 MED ORDER — NITROGLYCERIN 0.4 MG SL SUBL: 0.4000 mg | SUBLINGUAL_TABLET | SUBLINGUAL | Status: DC | PRN

## 2015-06-22 MED ORDER — CARVEDILOL 3.125 MG PO TABS: 3.1250 mg | ORAL_TABLET | Freq: Two times a day (BID) | ORAL | Status: DC

## 2015-06-22 MED ORDER — FAMOTIDINE 20 MG PO TABS: 20.0000 mg | ORAL_TABLET | Freq: Every day | ORAL | Status: DC

## 2015-06-22 MED ORDER — NICOTINE POLACRILEX 2 MG MT GUM: 2.0000 mg | CHEWING_GUM | OROMUCOSAL | Status: DC | PRN

## 2015-06-22 MED ORDER — CLOPIDOGREL BISULFATE 75 MG PO TABS: 75.0000 mg | ORAL_TABLET | Freq: Every day | ORAL | Status: DC

## 2015-06-22 MED ORDER — ASPIRIN 325 MG PO TBEC: 325.0000 mg | DELAYED_RELEASE_TABLET | Freq: Every day | ORAL | Status: DC

## 2015-06-22 NOTE — Progress Notes (Signed)
Patient ID: Samuel Lawson, male   DOB: 05-14-1946, 69 y.o.   MRN: [id number]   Subjective:  Patient ID: Samuel Lawson, male    DOB: [birth year]  Age: 69 y.o. MRN: [id number]  CC: Follow-up; Hypertension; and Hyperlipidemia   HPI Rashaad Hallstrom presents for   1. CHRONIC HYPERTENSION  Disease Monitoring  Blood pressure range: SBP 100-110  Chest pain: no   Dyspnea: no   Claudication: no   Medication compliance: yes  Medication Side Effects  Lightheadedness: no   Urinary frequency: no   Edema: no   Impotence: no   Preventitive Healthcare:  Exercise: yes     2. HLD: taking statin and fish oil. Would like med refill. No myalgias.  3. Chest pains: x 2 weeks. L sided CP with L arm heaviness. Comes and goes. Sometimes with exertion and sometimes with rest. No pain now. No cough or leg swelling. Patient has known CAD s/p stenting in 2002/2003 x 2.   Social History  Substance Use Topics  . Smoking status: Current Every Day Smoker -- 0.50 packs/day    Types: Cigarettes  . Smokeless tobacco: Never Used     Comment: states new years resolution is to quit smoking  . Alcohol Use: No    Outpatient Prescriptions Prior to Visit  Medication Sig Dispense Refill  . aspirin 325 MG EC tablet Take 1 tablet (325 mg total) by mouth daily. 90 tablet 2  . atorvastatin (LIPITOR) 40 MG tablet Take 1 tablet (40 mg total) by mouth at bedtime. 30 tablet 2  . carvedilol (COREG) 3.125 MG tablet Take 1 tablet (3.125 mg total) by mouth 2 (two) times daily with a meal. 30 tablet 11  . clopidogrel (PLAVIX) 75 MG tablet TAKE 1 TABLET BY MOUTH DAILY. 30 tablet 2  . famotidine (PEPCID) 20 MG tablet TAKE 1 TABLET BY MOUTH 2 TIMES DAILY. 60 tablet 2  . fish oil-omega-3 fatty acids 1000 MG capsule Take 1 g by mouth 2 (two) times daily.     Marland Kitchen amoxicillin (AMOXIL) 500 MG capsule Take 2 capsules (1,000 mg total) by mouth 2 (two) times daily. (Patient not taking: Reported on 06/22/2015) 32 capsule 0  . cetirizine  (ZYRTEC) 10 MG tablet Take 1 tablet (10 mg total) by mouth daily. (Patient not taking: Reported on 06/22/2015) 30 tablet 1  . HYDROcodone-acetaminophen (NORCO) 7.5-325 MG per tablet Take 1 tablet by mouth every 4 (four) hours as needed. (Patient not taking: Reported on 06/22/2015) 20 tablet 0  . meclizine (ANTIVERT) 25 MG tablet Take 1 tablet (25 mg total) by mouth 3 (three) times daily as needed for dizziness. (Patient not taking: Reported on 06/22/2015) 20 tablet 1   No facility-administered medications prior to visit.    ROS Review of Systems  Constitutional: Negative for fever, chills, fatigue and unexpected weight change.  Eyes: Negative for visual disturbance.  Respiratory: Negative for cough and shortness of breath.   Cardiovascular: Positive for chest pain. Negative for palpitations and leg swelling.  Gastrointestinal: Negative for nausea, vomiting, abdominal pain, diarrhea, constipation and blood in stool.  Endocrine: Negative for polydipsia, polyphagia and polyuria.  Musculoskeletal: Negative for myalgias, back pain, arthralgias, gait problem and neck pain.  Skin: Negative for rash.  Allergic/Immunologic: Negative for immunocompromised state.  Hematological: Negative for adenopathy. Does not bruise/bleed easily.  Psychiatric/Behavioral: Negative for suicidal ideas, sleep disturbance and dysphoric mood. The patient is not nervous/anxious.   GAD-7: score of 12. 2 for 1-6. 0 to 7.   Objective:  BP 101/66 mmHg  Pulse 59  Temp(Src) 98.1 F (36.7 C) (Oral)  Resp 16  Ht 5\' 7"  (1.702 m)  Wt 199 lb (90.266 kg)  BMI 31.16 kg/m2  SpO2 98%  BP/Weight 06/22/2015 1/59/4585 05/12/9243  Systolic BP 628 638 -  Diastolic BP 66 79 -  Wt. (Lbs) 199 - 208.6  BMI 31.16 - 32.66   Physical Exam  Constitutional: He appears well-developed and well-nourished. No distress.  HENT:  Head: Normocephalic and atraumatic.  Neck: Normal range of motion. Neck supple.  Cardiovascular: Normal rate,  regular rhythm, normal heart sounds and intact distal pulses.   Pulmonary/Chest: Effort normal and breath sounds normal.  Musculoskeletal: He exhibits no edema.  Neurological: He is alert.  Skin: Skin is warm and dry. No rash noted. No erythema.  Psychiatric: He has a normal mood and affect.   GI cocktail x one  EKG: normal EKG, normal sinus rhythm, unchanged from previous tracings.   Assessment & Plan:   Problem List Items Addressed This Visit    Chest pain    A: chest pain ? Stable angina vs GERD in patient with known CAD. Ex-smoker.  P: ECHO due to low BP EKG, CXR rx for NTG for prn use Patient advised to f/u with Dr. Verl Blalock        Relevant Medications   nitroGLYCERIN (NITROSTAT) 0.4 MG SL tablet   Other Relevant Orders   Echocardiogram   DG Chest 2 View   Coronary artery disease involving native coronary artery of native heart without angina pectoris   Relevant Medications   clopidogrel (PLAVIX) 75 MG tablet   carvedilol (COREG) 3.125 MG tablet   atorvastatin (LIPITOR) 40 MG tablet   aspirin 325 MG EC tablet   nitroGLYCERIN (NITROSTAT) 0.4 MG SL tablet   Essential hypertension   Relevant Medications   carvedilol (COREG) 3.125 MG tablet   atorvastatin (LIPITOR) 40 MG tablet   aspirin 325 MG EC tablet   nitroGLYCERIN (NITROSTAT) 0.4 MG SL tablet   Gastroesophageal reflux disease without esophagitis   Relevant Medications   famotidine (PEPCID) 20 MG tablet   Hyperlipidemia LDL goal <100   Relevant Medications   carvedilol (COREG) 3.125 MG tablet   atorvastatin (LIPITOR) 40 MG tablet   aspirin 325 MG EC tablet   nitroGLYCERIN (NITROSTAT) 0.4 MG SL tablet   Nicotine dependence - Primary   Relevant Medications   nicotine polacrilex (NICORETTE) 2 MG gum    Other Visit Diagnoses    High cholesterol        Relevant Medications    carvedilol (COREG) 3.125 MG tablet    atorvastatin (LIPITOR) 40 MG tablet    aspirin 325 MG EC tablet    nitroGLYCERIN (NITROSTAT) 0.4  MG SL tablet    Healthcare maintenance        Relevant Orders    Flu Vaccine QUAD 36+ mos IM (Completed)       No orders of the defined types were placed in this encounter.    Follow-up: No Follow-up on file.   Boykin Nearing MD

## 2015-06-22 NOTE — Progress Notes (Signed)
F/U HTN Medication refills Requesting Rx nicotine gum Quit tobacco two month ago

## 2015-06-22 NOTE — Patient Instructions (Addendum)
Joncarlos was seen today for follow-up, hypertension and hyperlipidemia.  Diagnoses and all orders for this visit:  Other tobacco product nicotine dependence, uncomplicated -     nicotine polacrilex (NICORETTE) 2 MG gum; Take 1 each (2 mg total) by mouth as needed for smoking cessation.  Essential hypertension -     carvedilol (COREG) 3.125 MG tablet; Take 1 tablet (3.125 mg total) by mouth 2 (two) times daily with a meal.  Hyperlipidemia LDL goal <100  Precordial pain -     Echocardiogram; Future -     DG Chest 2 View; Future  High cholesterol -     atorvastatin (LIPITOR) 40 MG tablet; Take 1 tablet (40 mg total) by mouth at bedtime.  Gastroesophageal reflux disease without esophagitis -     famotidine (PEPCID) 20 MG tablet; Take 1 tablet (20 mg total) by mouth daily.  Coronary artery disease involving native coronary artery of native heart without angina pectoris -     clopidogrel (PLAVIX) 75 MG tablet; Take 1 tablet (75 mg total) by mouth daily. -     carvedilol (COREG) 3.125 MG tablet; Take 1 tablet (3.125 mg total) by mouth 2 (two) times daily with a meal. -     aspirin 325 MG EC tablet; Take 1 tablet (325 mg total) by mouth daily.   Good job with smoking cessation.   Please go for ECHO and CXR  F/u with Dr. Verl Blalock in 3-4 weeks for chest pain  F/u with me in 3 months  Dr. Adrian Blackwater

## 2015-06-22 NOTE — Assessment & Plan Note (Signed)
A: chest pain ? Stable angina vs GERD in patient with known CAD. Ex-smoker.  P: ECHO due to low BP EKG, CXR rx for NTG for prn use Patient advised to f/u with Dr. Verl Blalock

## 2015-06-28 ENCOUNTER — Telehealth: Payer: Self-pay | Admitting: *Deleted

## 2015-06-28 NOTE — Telephone Encounter (Signed)
ECHO appointment at Vascular lab [facility]: Jul 03, 2015 at 09:00am arriving 15 min early Pt notified

## 2015-07-03 ENCOUNTER — Ambulatory Visit (HOSPITAL_COMMUNITY)
Admission: RE | Admit: 2015-07-03 | Discharge: 2015-07-03 | Disposition: A | Discharge status: Home / Self Care | Payer: Medicare Other | Source: Ambulatory Visit | Attending: Family Medicine | Admitting: Family Medicine

## 2015-07-03 DIAGNOSIS — R072 Precordial pain: Secondary | ICD-10-CM | POA: Diagnosis not present

## 2015-07-03 DIAGNOSIS — E785 Hyperlipidemia, unspecified: Secondary | ICD-10-CM | POA: Insufficient documentation

## 2015-07-03 DIAGNOSIS — I10 Essential (primary) hypertension: Secondary | ICD-10-CM | POA: Diagnosis not present

## 2015-07-03 DIAGNOSIS — R079 Chest pain, unspecified: Secondary | ICD-10-CM | POA: Diagnosis not present

## 2015-07-03 DIAGNOSIS — F17200 Nicotine dependence, unspecified, uncomplicated: Secondary | ICD-10-CM | POA: Diagnosis not present

## 2015-07-03 DIAGNOSIS — I517 Cardiomegaly: Secondary | ICD-10-CM | POA: Diagnosis not present

## 2015-07-03 NOTE — Progress Notes (Signed)
*  PRELIMINARY RESULTS* Echocardiogram 2D Echocardiogram has been performed.  Leavy Cella 07/03/2015, 9:42 AM

## 2015-07-19 ENCOUNTER — Ambulatory Visit: Payer: Medicare Other | Attending: Family Medicine | Admitting: Family Medicine

## 2015-07-19 ENCOUNTER — Encounter: Payer: Self-pay | Admitting: Family Medicine

## 2015-07-19 ENCOUNTER — Ambulatory Visit: Payer: Medicare Other | Admitting: Cardiology

## 2015-07-19 ENCOUNTER — Encounter: Payer: Self-pay | Admitting: Cardiology

## 2015-07-19 VITALS — BP 123/72 | HR 55 | Temp 98.1°F | Resp 18 | Ht 64.0 in | Wt 206.4 lb

## 2015-07-19 DIAGNOSIS — N400 Enlarged prostate without lower urinary tract symptoms: Secondary | ICD-10-CM | POA: Insufficient documentation

## 2015-07-19 DIAGNOSIS — I2510 Atherosclerotic heart disease of native coronary artery without angina pectoris: Secondary | ICD-10-CM | POA: Diagnosis not present

## 2015-07-19 LAB — POCT URINALYSIS DIPSTICK
BILIRUBIN UA: NEGATIVE
Blood, UA: NEGATIVE
Glucose, UA: NEGATIVE
Ketones, UA: NEGATIVE
LEUKOCYTES UA: NEGATIVE
NITRITE UA: NEGATIVE
PH UA: 6
Protein, UA: NEGATIVE
Spec Grav, UA: 1.01
Urobilinogen, UA: 0.2

## 2015-07-19 LAB — HEMOCCULT GUIAC POC 1CARD (OFFICE): Fecal Occult Blood, POC: NEGATIVE

## 2015-07-19 LAB — BASIC METABOLIC PANEL
BUN: 13 mg/dL (ref 7–25)
CHLORIDE: 106 mmol/L (ref 98–110)
CO2: 31 mmol/L (ref 20–31)
CREATININE: 1 mg/dL (ref 0.70–1.25)
Calcium: 9.7 mg/dL (ref 8.6–10.3)
Glucose, Bld: 114 mg/dL — ABNORMAL HIGH (ref 65–99)
POTASSIUM: 5 mmol/L (ref 3.5–5.3)
SODIUM: 142 mmol/L (ref 135–146)

## 2015-07-19 MED ORDER — TAMSULOSIN HCL 0.4 MG PO CAPS: 0.4000 mg | ORAL_CAPSULE | Freq: Every day | ORAL | Status: DC

## 2015-07-19 NOTE — Progress Notes (Signed)
Subjective:  Patient ID: Samuel Lawson, male    DOB: 1946/03/17  Age: 69 y.o. MRN: [id number]  CC: Dysuria   HPI Hollis Tuller presents for dysuria   1. Dysuria: x 2 days. With hesitancy and low back pain. No fever, chills, penile discharge, flank pain.  He is circumcised. No family hx of prostate cancer.   Social History  Substance Use Topics  . Smoking status: Current Every Day Smoker -- 0.50 packs/day    Types: Cigarettes  . Smokeless tobacco: Never Used     Comment: states new years resolution is to quit smoking  . Alcohol Use: No    Outpatient Prescriptions Prior to Visit  Medication Sig Dispense Refill  . aspirin 325 MG EC tablet Take 1 tablet (325 mg total) by mouth daily. 90 tablet 2  . atorvastatin (LIPITOR) 40 MG tablet Take 1 tablet (40 mg total) by mouth at bedtime. 30 tablet 11  . carvedilol (COREG) 3.125 MG tablet Take 1 tablet (3.125 mg total) by mouth 2 (two) times daily with a meal. 60 tablet 11  . clopidogrel (PLAVIX) 75 MG tablet Take 1 tablet (75 mg total) by mouth daily. 30 tablet 11  . famotidine (PEPCID) 20 MG tablet Take 1 tablet (20 mg total) by mouth daily. 30 tablet 5  . fish oil-omega-3 fatty acids 1000 MG capsule Take 1 g by mouth 2 (two) times daily.     . nicotine polacrilex (NICORETTE) 2 MG gum Take 1 each (2 mg total) by mouth as needed for smoking cessation. 100 tablet 0  . nitroGLYCERIN (NITROSTAT) 0.4 MG SL tablet Place 1 tablet (0.4 mg total) under the tongue every 5 (five) minutes as needed for chest pain. (Patient not taking: Reported on 07/19/2015) 20 tablet 3   No facility-administered medications prior to visit.    ROS Review of Systems  Constitutional: Negative for fever, chills, fatigue and unexpected weight change.  Eyes: Negative for visual disturbance.  Respiratory: Negative for cough and shortness of breath.   Cardiovascular: Negative for chest pain, palpitations and leg swelling.  Gastrointestinal: Negative for nausea,  vomiting, abdominal pain, diarrhea, constipation and blood in stool.  Endocrine: Negative for polydipsia, polyphagia and polyuria.  Genitourinary: Positive for dysuria and frequency.       Nocturia hesitancy   Musculoskeletal: Negative for myalgias, back pain, arthralgias, gait problem and neck pain.  Skin: Negative for rash.  Allergic/Immunologic: Negative for immunocompromised state.  Hematological: Negative for adenopathy. Does not bruise/bleed easily.  Psychiatric/Behavioral: Negative for suicidal ideas, sleep disturbance and dysphoric mood. The patient is not nervous/anxious.     Objective:  BP 123/72 mmHg  Pulse 55  Temp(Src) 98.1 F (36.7 C) (Oral)  Resp 18  Ht 5\' 4"  (1.626 m)  Wt 206 lb 6.4 oz (93.622 kg)  BMI 35.41 kg/m2  SpO2 98%  BP/Weight 07/19/2015 07/19/2015 51/76/1607  Systolic BP 371 062 694  Diastolic BP 72 72 66  Wt. (Lbs) 206.4 206.4 199  BMI 35.41 35.41 31.16    Physical Exam  Constitutional: He appears well-developed and well-nourished. No distress.  HENT:  Head: Normocephalic and atraumatic.  Neck: Normal range of motion. Neck supple.  Cardiovascular: Normal rate, regular rhythm, normal heart sounds and intact distal pulses.   Pulmonary/Chest: Effort normal and breath sounds normal.  Genitourinary: Testes normal and penis normal. Guaiac negative stool. Prostate is enlarged. Prostate is not tender. Circumcised.  Musculoskeletal: He exhibits no edema.  Neurological: He is alert.  Skin: Skin is warm and  dry. No rash noted. No erythema.  Psychiatric: He has a normal mood and affect.   UA: normal  Assessment & Plan:   Problem List Items Addressed This Visit    BPH (benign prostatic hyperplasia) - Primary   Relevant Medications   tamsulosin (FLOMAX) 0.4 MG CAPS capsule   Other Relevant Orders   POCT urinalysis dipstick (Completed)   Hemoccult - 1 Card (office) (Completed)   PSA   Basic Metabolic Panel      No orders of the defined types were  placed in this encounter.    Follow-up: No Follow-up on file.   Boykin Nearing MD

## 2015-07-19 NOTE — Patient Instructions (Addendum)
Samuel Lawson was seen today for dysuria.  Diagnoses and all orders for this visit:  BPH (benign prostatic hyperplasia) -     POCT urinalysis dipstick -     Hemoccult - 1 Card (office) -     PSA -     Basic Metabolic Panel -     tamsulosin (FLOMAX) 0.4 MG CAPS capsule; Take 1 capsule (0.4 mg total) by mouth daily.   F/u in 4 weeks for BPH  Dr. Adrian Blackwater   Benign Prostatic Hyperplasia An enlarged prostate (benign prostatic hyperplasia) is common in older men. You may experience the following:  Weak urine stream.  Dribbling.  Feeling like the bladder has not emptied completely.  Difficulty starting urination.  Getting up frequently at night to urinate.  Urinating more frequently during the day. HOME CARE INSTRUCTIONS  Monitor your prostatic hyperplasia for any changes. The following actions may help to alleviate any discomfort you are experiencing:  Give yourself time when you urinate.  Stay away from alcohol.  Avoid beverages containing caffeine, such as coffee, tea, and colas, because they can make the problem worse.  Avoid decongestants, antihistamines, and some prescription medicines that can make the problem worse.  Follow up with your health care provider for further treatment as recommended. SEEK MEDICAL CARE IF:  You are experiencing progressive difficulty voiding.  Your urine stream is progressively getting narrower.  You are awaking from sleep with the urge to void more frequently.  You are constantly feeling the need to void.  You experience loss of urine, especially in small amounts. SEEK IMMEDIATE MEDICAL CARE IF:   You develop increased pain with urination or are unable to urinate.  You develop severe abdominal pain, vomiting, a high fever, or fainting.  You develop back pain or blood in your urine. MAKE SURE YOU:   Understand these instructions.  Will watch your condition.  Will get help right away if you are not doing well or get worse.   This  information is not intended to replace advice given to you by your health care provider. Make sure you discuss any questions you have with your health care provider.   Document Released: 08/26/2005 Document Revised: 09/16/2014 Document Reviewed: 01/26/2013 Elsevier Interactive Patient Education [employer].

## 2015-07-19 NOTE — Progress Notes (Unsigned)
Patient referred by Dr. Adrian Blackwater.  Patient denies pain, swelling, SOB and wheezing.   Patient requesting refill on Nicotine gum

## 2015-07-19 NOTE — Progress Notes (Signed)
Patient complains of polyuria with minimum output.  Patient complains of kidneys hurting.

## 2015-07-20 LAB — PSA: PSA: 1.09 ng/mL (ref <=4.00)

## 2015-07-26 ENCOUNTER — Ambulatory Visit: Payer: Medicare Other | Attending: Cardiology | Admitting: Cardiology

## 2015-07-26 ENCOUNTER — Encounter: Payer: Self-pay | Admitting: Cardiology

## 2015-07-26 DIAGNOSIS — I10 Essential (primary) hypertension: Secondary | ICD-10-CM | POA: Diagnosis not present

## 2015-07-26 DIAGNOSIS — I2510 Atherosclerotic heart disease of native coronary artery without angina pectoris: Secondary | ICD-10-CM

## 2015-07-26 DIAGNOSIS — F17290 Nicotine dependence, other tobacco product, uncomplicated: Secondary | ICD-10-CM | POA: Diagnosis not present

## 2015-07-26 MED ORDER — NICOTINE POLACRILEX 2 MG MT GUM: 2.0000 mg | CHEWING_GUM | OROMUCOSAL | Status: DC | PRN

## 2015-07-26 NOTE — Progress Notes (Signed)
Samuel Lawson returns today per the request of Dr.Funches for borderline low blood pressure which was asymptomatic and follow-up echocardiogram.  His echocardiogram shows ejection fraction of 65% with no segmental Chett Taniguchi motion abnormalities. He has normal left ventricular chamber size but moderate LVH. The latter secondary to hypertension.  Exam is unchanged.

## 2015-07-26 NOTE — Patient Instructions (Signed)
Thank you for visiting with Dr. Verl Blalock today. Please continue to implement diet and lifestyle changes to assist with weight loss.

## 2015-07-26 NOTE — Assessment & Plan Note (Signed)
His blood pressures were 1 to be. As long as he is not symptomatic, anything above 123XX123 systolic is fine. He was stable on low-dose carvedilol which I lowered in the past because orthostatic hypotension. I'll see him back in a year. Echo results were reviewed with him and he was pleased. His chest x-ray also showed no cardiopulmonary disease.

## 2015-08-02 ENCOUNTER — Telehealth: Payer: Self-pay | Admitting: *Deleted

## 2015-08-02 NOTE — Telephone Encounter (Signed)
-----   Message from Arnoldo Morale, MD sent at 07/04/2015  4:50 PM EDT ----- Please inform the patient that chest x-ray is normal. Thank you.

## 2015-08-02 NOTE — Telephone Encounter (Signed)
Date of birth verified by pt Pt notified Xray normal Pt verbalized understanding

## 2015-08-11 ENCOUNTER — Telehealth: Payer: Self-pay | Admitting: Family Medicine

## 2015-08-11 DIAGNOSIS — K219 Gastro-esophageal reflux disease without esophagitis: Secondary | ICD-10-CM

## 2015-08-11 NOTE — Telephone Encounter (Signed)
Pt. Came in today requesting to speak to nurse regarding a medication. Pt. Stated that he used to take two tablets of famotidine (PEPCID) 20 MG tablet and now he was prescribe one tablet daily. Pt. Would like to know why it was changed.  Pt. Would also like a refill on the medication.Please f/u with pt.

## 2015-08-15 MED ORDER — FAMOTIDINE 20 MG PO TABS
20.0000 mg | ORAL_TABLET | Freq: Two times a day (BID) | ORAL | Status: DC
Start: 2015-08-15 — End: 2016-01-22

## 2015-08-15 NOTE — Telephone Encounter (Signed)
Called patient Verified name and DOB.   I informed him, that I refilled prilosec back to [facility].  I listened to patient's concerns. He is frustrated that he is not able to get all of his meds at once. He reports standing in pharmacy line for 1 hr. Getting 1 Rx. Being asked to return in 1 weeks for other Rx. He would like to get them all at once.  He reports being told that if he has "one pill left" he will have to wait for a refill.   I apologized for the inconvenience and validated his frustration.   He apologized for using foul language.  I informed him that cursing and using foul language is not tolerated when he interacts with anyone at the clinic. If it happens again, it will be documented and he will be discharged from the practice. He voiced understanding.

## 2015-08-15 NOTE — Telephone Encounter (Signed)
Patient called nurse, patient verified date of birth. Patient explains he has always taken Pepcid, 2 pills daily. At last visit prescription was ordered for 1 time daily. Patient needs prescription changed to [facility]. Patient explains he has taken Pepcid, 2 pills daily for 15 years. Nurse will send message to provider.

## 2015-09-12 MED FILL — ATORVASTATIN 40 MG TABLET: 40 | 30 days supply | Qty: 30 | Fill #0

## 2015-09-12 MED FILL — FAMOTIDINE 20 MG TABLET: 20 | 30 days supply | Qty: 60 | Fill #1

## 2015-09-14 ENCOUNTER — Telehealth: Payer: Self-pay | Admitting: Family Medicine

## 2015-09-14 NOTE — Telephone Encounter (Signed)
Pt. Called to schedule appointment with pcp....pt. Was informed earliest appointment would be next Friday the 13th...patient was frustrated and said go to hell and hung up...Marland KitchenMarland KitchenMarland Kitchen

## 2015-09-22 MED FILL — TAMSULOSIN HCL 0.4 MG CAP: 0.4 | 30 days supply | Qty: 30 | Fill #2

## 2015-09-22 MED FILL — CARVEDILOL 3.125 MG TABLET: 3.125 | 30 days supply | Qty: 60 | Fill #9

## 2015-09-22 MED FILL — CLOPIDOGREL 75 MG TABLET: 75 | 30 days supply | Qty: 30 | Fill #3

## 2015-10-12 MED FILL — FAMOTIDINE 20 MG TABLET: 20 | 30 days supply | Qty: 60 | Fill #2

## 2015-10-12 MED FILL — ATORVASTATIN 40 MG TABLET: 40 | 30 days supply | Qty: 30 | Fill #1

## 2015-10-25 MED FILL — TAMSULOSIN HCL 0.4 MG CAP: 0.4 | 30 days supply | Qty: 30 | Fill #3

## 2015-10-25 MED FILL — CARVEDILOL 3.125 MG TABLET: 3.125 | 30 days supply | Qty: 60 | Fill #10

## 2015-10-25 MED FILL — CLOPIDOGREL 75 MG TABLET: 75 | 30 days supply | Qty: 30 | Fill #4

## 2015-11-10 MED FILL — FAMOTIDINE 20 MG TABLET: 20 | 30 days supply | Qty: 60 | Fill #3

## 2015-11-10 MED FILL — ATORVASTATIN 40 MG TABLET: 40 | 30 days supply | Qty: 30 | Fill #2

## 2015-11-24 ENCOUNTER — Other Ambulatory Visit: Payer: Self-pay | Admitting: Family Medicine

## 2015-11-24 MED FILL — TAMSULOSIN HCL 0.4 MG CAP: 0.4 | 30 days supply | Qty: 30 | Fill #0

## 2015-11-24 MED FILL — CLOPIDOGREL 75 MG TABLET: 75 | 30 days supply | Qty: 30 | Fill #5

## 2015-11-24 MED FILL — CARVEDILOL 3.125 MG TABLET: 3.125 | 30 days supply | Qty: 60 | Fill #11

## 2015-12-07 MED FILL — ATORVASTATIN 40 MG TABLET: 40 | 30 days supply | Qty: 30 | Fill #3

## 2015-12-07 MED FILL — FAMOTIDINE 20 MG TABLET: 20 | 30 days supply | Qty: 60 | Fill #4

## 2015-12-26 ENCOUNTER — Other Ambulatory Visit: Payer: Self-pay | Admitting: Family Medicine

## 2015-12-26 MED FILL — CARVEDILOL 3.125 MG TABLET: 3.125 | 30 days supply | Qty: 60 | Fill #0

## 2015-12-26 MED FILL — CLOPIDOGREL 75 MG TABLET: 75 | 30 days supply | Qty: 30 | Fill #6

## 2015-12-28 ENCOUNTER — Other Ambulatory Visit: Payer: Self-pay | Admitting: Family Medicine

## 2016-01-01 ENCOUNTER — Other Ambulatory Visit: Payer: Self-pay | Admitting: Pharmacist

## 2016-01-01 MED ORDER — TAMSULOSIN HCL 0.4 MG PO CAPS: 0.4000 mg | ORAL_CAPSULE | Freq: Every day | ORAL | Status: DC

## 2016-01-01 MED FILL — TAMSULOSIN HCL 0.4 MG CAP: 0.4 | 30 days supply | Qty: 30 | Fill #0

## 2016-01-09 MED FILL — FAMOTIDINE 20 MG TABLET: 20 | 30 days supply | Qty: 60 | Fill #5

## 2016-01-09 MED FILL — ATORVASTATIN 40 MG TABLET: 40 | 30 days supply | Qty: 30 | Fill #4

## 2016-01-22 ENCOUNTER — Encounter: Payer: Self-pay | Admitting: Family Medicine

## 2016-01-22 ENCOUNTER — Ambulatory Visit: Payer: Medicare Other | Attending: Family Medicine | Admitting: Family Medicine

## 2016-01-22 VITALS — BP 109/66 | HR 67 | Temp 98.4°F | Resp 16 | Ht 64.0 in | Wt 214.0 lb

## 2016-01-22 DIAGNOSIS — I10 Essential (primary) hypertension: Secondary | ICD-10-CM | POA: Insufficient documentation

## 2016-01-22 DIAGNOSIS — Z79899 Other long term (current) drug therapy: Secondary | ICD-10-CM | POA: Diagnosis not present

## 2016-01-22 DIAGNOSIS — E785 Hyperlipidemia, unspecified: Secondary | ICD-10-CM

## 2016-01-22 DIAGNOSIS — Z7982 Long term (current) use of aspirin: Secondary | ICD-10-CM | POA: Diagnosis not present

## 2016-01-22 DIAGNOSIS — L301 Dyshidrosis [pompholyx]: Secondary | ICD-10-CM | POA: Insufficient documentation

## 2016-01-22 DIAGNOSIS — Z1159 Encounter for screening for other viral diseases: Secondary | ICD-10-CM | POA: Diagnosis not present

## 2016-01-22 DIAGNOSIS — E7800 Pure hypercholesterolemia, unspecified: Secondary | ICD-10-CM | POA: Diagnosis not present

## 2016-01-22 DIAGNOSIS — F17290 Nicotine dependence, other tobacco product, uncomplicated: Secondary | ICD-10-CM | POA: Diagnosis not present

## 2016-01-22 DIAGNOSIS — R072 Precordial pain: Secondary | ICD-10-CM

## 2016-01-22 DIAGNOSIS — I2510 Atherosclerotic heart disease of native coronary artery without angina pectoris: Secondary | ICD-10-CM

## 2016-01-22 DIAGNOSIS — K219 Gastro-esophageal reflux disease without esophagitis: Secondary | ICD-10-CM

## 2016-01-22 DIAGNOSIS — N400 Enlarged prostate without lower urinary tract symptoms: Secondary | ICD-10-CM

## 2016-01-22 MED ORDER — CLOPIDOGREL BISULFATE 75 MG PO TABS: 75.0000 mg | ORAL_TABLET | Freq: Every day | ORAL | Status: DC

## 2016-01-22 MED ORDER — TAMSULOSIN HCL 0.4 MG PO CAPS: 0.4000 mg | ORAL_CAPSULE | Freq: Every day | ORAL | Status: DC

## 2016-01-22 MED ORDER — NICOTINE POLACRILEX 2 MG MT GUM: 2.0000 mg | CHEWING_GUM | OROMUCOSAL | Status: DC | PRN

## 2016-01-22 MED ORDER — ASPIRIN 325 MG PO TBEC: 325.0000 mg | DELAYED_RELEASE_TABLET | Freq: Every day | ORAL | Status: DC

## 2016-01-22 MED ORDER — NITROGLYCERIN 0.4 MG SL SUBL: 0.4000 mg | SUBLINGUAL_TABLET | SUBLINGUAL | Status: DC | PRN

## 2016-01-22 MED ORDER — ATORVASTATIN CALCIUM 40 MG PO TABS: 40.0000 mg | ORAL_TABLET | Freq: Every day | ORAL | Status: DC

## 2016-01-22 MED ORDER — FAMOTIDINE 20 MG PO TABS: 20.0000 mg | ORAL_TABLET | Freq: Two times a day (BID) | ORAL | Status: DC

## 2016-01-22 MED ORDER — TRIAMCINOLONE ACETONIDE 0.1 % EX CREA: 1.0000 "application " | TOPICAL_CREAM | Freq: Two times a day (BID) | CUTANEOUS | Status: DC

## 2016-01-22 MED ORDER — CARVEDILOL 3.125 MG PO TABS: 3.1250 mg | ORAL_TABLET | Freq: Two times a day (BID) | ORAL | Status: DC

## 2016-01-22 MED FILL — CARVEDILOL 3.125 MG TABLET: 3.125 | 30 days supply | Qty: 60 | Fill #0

## 2016-01-22 MED FILL — CLOPIDOGREL 75 MG TABLET: 75 | 30 days supply | Qty: 30 | Fill #0

## 2016-01-22 MED FILL — NITROSTAT 0.4 MG TABLET SL: 0.4 | 25 days supply | Qty: 25 | Fill #0

## 2016-01-22 MED FILL — TRIAMCINOLONE 0.1% CREAM: 0.1 | 15 days supply | Qty: 80 | Fill #0

## 2016-01-22 NOTE — Patient Instructions (Addendum)
Samuel Lawson was seen today for hypertension.  Diagnoses and all orders for this visit:  Coronary artery disease involving native coronary artery of native heart without angina pectoris -     aspirin 325 MG EC tablet; Take 1 tablet (325 mg total) by mouth daily. -     carvedilol (COREG) 3.125 MG tablet; Take 1 tablet (3.125 mg total) by mouth 2 (two) times daily with a meal. -     clopidogrel (PLAVIX) 75 MG tablet; Take 1 tablet (75 mg total) by mouth daily. -     nitroGLYCERIN (NITROSTAT) 0.4 MG SL tablet; Place 1 tablet (0.4 mg total) under the tongue every 5 (five) minutes as needed for chest pain. -     Lipid Panel  Essential hypertension -     carvedilol (COREG) 3.125 MG tablet; Take 1 tablet (3.125 mg total) by mouth 2 (two) times daily with a meal.  Gastroesophageal reflux disease without esophagitis -     famotidine (PEPCID) 20 MG tablet; Take 1 tablet (20 mg total) by mouth 2 (two) times daily.  Other tobacco product nicotine dependence, uncomplicated -     nicotine polacrilex (NICORETTE) 2 MG gum; Take 1 each (2 mg total) by mouth as needed for smoking cessation.  Precordial pain -     nitroGLYCERIN (NITROSTAT) 0.4 MG SL tablet; Place 1 tablet (0.4 mg total) under the tongue every 5 (five) minutes as needed for chest pain.  BPH (benign prostatic hyperplasia) -     tamsulosin (FLOMAX) 0.4 MG CAPS capsule; Take 1 capsule (0.4 mg total) by mouth daily. Needs office visit for refills  Need for hepatitis C screening test -     Hepatitis C antibody, reflex  Dyshidrotic hand dermatitis -     triamcinolone cream (KENALOG) 0.1 %; Apply 1 application topically 2 (two) times daily. Apply to R hand  Hyperlipidemia LDL goal <100 -     atorvastatin (LIPITOR) 40 MG tablet; Take 1 tablet (40 mg total) by mouth at bedtime.   Smoking cessation support: smoking cessation hotline: 1-800-QUIT-NOW.  Smoking cessation classes are available through [facility]. Call  [phone] or visit our website at [email or web address]  Use tonic water with quinine for spasms under L upper arm.   F/u in 6  Months HTN   Dr. Adrian Blackwater

## 2016-01-22 NOTE — Progress Notes (Signed)
F/U HTN  C/C rash on hand, white rash and itchy Medicine refills  No pain today  Tobacco user-6-7 cigarette per day  No suicidal thoughts in the past two weeks

## 2016-01-22 NOTE — Assessment & Plan Note (Signed)
Steroid cream prescribed.  

## 2016-01-22 NOTE — Progress Notes (Signed)
Subjective:  Patient ID: Samuel Lawson, male    DOB: [birth year]  Age: 70 y.o. MRN: [id number]  CC: Hypertension   HPI Samuel Lawson presents for   1. CAD with hypotension: no CP. Request refills of all medications.   2. Hand rash: x 5-10 years. R hand thick and dry patches that are itchy. Using thick moisturizer without relief. No joint pain.   3. Spasms: in L under arm and upper back. Was present for one week. Then resolved. No pain now. Could see and feel the spasm under his L arm. No trauma.   4. Smoking: he is now smoking 5 cigs per day after quitting for one year. Desires to quit. No CP or SOB. Request refills of nicorette gum.    Social History  Substance Use Topics  . Smoking status: Current Every Day Smoker -- 0.50 packs/day    Types: Cigarettes  . Smokeless tobacco: Never Used     Comment: states new years resolution is to quit smoking  . Alcohol Use: No   Outpatient Prescriptions Prior to Visit  Medication Sig Dispense Refill  . aspirin 325 MG EC tablet Take 1 tablet (325 mg total) by mouth daily. 90 tablet 2  . atorvastatin (LIPITOR) 40 MG tablet Take 1 tablet (40 mg total) by mouth at bedtime. 30 tablet 11  . carvedilol (COREG) 3.125 MG tablet Take 1 tablet (3.125 mg total) by mouth 2 (two) times daily with a meal. 60 tablet 11  . clopidogrel (PLAVIX) 75 MG tablet Take 1 tablet (75 mg total) by mouth daily. 30 tablet 11  . famotidine (PEPCID) 20 MG tablet Take 1 tablet (20 mg total) by mouth 2 (two) times daily. 60 tablet 5  . fish oil-omega-3 fatty acids 1000 MG capsule Take 1 g by mouth 2 (two) times daily.     . nicotine polacrilex (NICORETTE) 2 MG gum Take 1 each (2 mg total) by mouth as needed for smoking cessation. 100 tablet 0  . nitroGLYCERIN (NITROSTAT) 0.4 MG SL tablet Place 1 tablet (0.4 mg total) under the tongue every 5 (five) minutes as needed for chest pain. (Patient not taking: Reported on 07/19/2015) 20 tablet 3  . tamsulosin (FLOMAX) 0.4 MG CAPS  capsule Take 1 capsule (0.4 mg total) by mouth daily. Needs office visit for refills 30 capsule 0   No facility-administered medications prior to visit.    ROS Review of Systems  Constitutional: Negative for fever, chills, fatigue and unexpected weight change.  Eyes: Negative for visual disturbance.  Respiratory: Negative for cough and shortness of breath.   Cardiovascular: Negative for chest pain, palpitations and leg swelling.  Gastrointestinal: Negative for nausea, vomiting, abdominal pain, diarrhea, constipation and blood in stool.  Endocrine: Negative for polydipsia, polyphagia and polyuria.  Musculoskeletal: Positive for myalgias. Negative for back pain, arthralgias, gait problem and neck pain.  Skin: Positive for rash (on hand ).  Allergic/Immunologic: Negative for immunocompromised state.  Hematological: Negative for adenopathy. Does not bruise/bleed easily.  Psychiatric/Behavioral: Negative for suicidal ideas, sleep disturbance and dysphoric mood. The patient is not nervous/anxious.     Objective:  BP 109/66 mmHg  Pulse 67  Temp(Src) 98.4 F (36.9 C) (Oral)  Resp 16  Ht 5\' 4"  (1.626 m)  Wt 214 lb (97.07 kg)  BMI 36.72 kg/m2  SpO2 96%  BP/Weight 01/22/2016 07/19/2015 [id number]  Systolic BP 0000000 [id number]  Diastolic BP 66 72 72  Wt. (Lbs) 214 206.4 206.4  BMI 36.72 35.41 35.41  Physical Exam  Constitutional: He appears well-developed and well-nourished. No distress.  HENT:  Head: Normocephalic and atraumatic.  Neck: Normal range of motion. Neck supple.  Cardiovascular: Normal rate, regular rhythm, normal heart sounds and intact distal pulses.   Pulmonary/Chest: Effort normal and breath sounds normal.  Musculoskeletal: He exhibits no edema.  Neurological: He is alert.  Skin: Skin is warm and dry. Rash noted. No erythema.     Psychiatric: He has a normal mood and affect.    Assessment & Plan:   There are no diagnoses linked to this encounter.  No orders of the  defined types were placed in this encounter.    Follow-up: No Follow-up on file.   Boykin Nearing MD

## 2016-01-23 LAB — LIPID PANEL
CHOL/HDL RATIO: 3.3 ratio (ref <=5.0)
CHOLESTEROL: 137 mg/dL (ref 125–200)
HDL: 41 mg/dL (ref >=40)
LDL Cholesterol: 47 mg/dL (ref <130)
Triglycerides: 246 mg/dL — ABNORMAL HIGH (ref <150)
VLDL: 49 mg/dL — AB (ref <30)

## 2016-01-23 LAB — HEPATITIS C ANTIBODY: HCV Ab: NEGATIVE

## 2016-01-26 MED FILL — TAMSULOSIN HCL 0.4 MG CAP: 0.4 | 30 days supply | Qty: 30 | Fill #0

## 2016-02-06 ENCOUNTER — Telehealth: Payer: Self-pay | Admitting: *Deleted

## 2016-02-06 NOTE — Telephone Encounter (Signed)
Date of birth verified by pt  Lab results given  Hep C Negative  Lipids stable, continue taking Lipitor 40 mg daily  Pt verbalized understanding

## 2016-02-06 NOTE — Telephone Encounter (Signed)
-----   Message from Boykin Nearing, MD sent at 01/23/2016  2:53 PM EDT ----- Screening Hep C negative Non fasting lipid is fairly stable continue lipitor 40 mg daily

## 2016-02-14 MED FILL — ATORVASTATIN 40 MG TABLET: 40 | 30 days supply | Qty: 30 | Fill #0

## 2016-02-14 MED FILL — FAMOTIDINE 20 MG TABLET: 20 | 30 days supply | Qty: 60 | Fill #0

## 2016-02-22 ENCOUNTER — Telehealth: Payer: Self-pay | Admitting: Family Medicine

## 2016-02-22 MED FILL — CLOPIDOGREL 75 MG TABLET: 75 | 30 days supply | Qty: 30 | Fill #0

## 2016-02-22 MED FILL — CARVEDILOL 3.125 MG TABLET: 3.125 | 30 days supply | Qty: 60 | Fill #1

## 2016-02-22 NOTE — Telephone Encounter (Signed)
Pt. Called yelling stating that he received a call from this facility. Pt. Does not know who called him and  Stated the VM said to gives [place] a call back b/c they had important information. I did not see a note in the  System. Please f/u with pt.

## 2016-02-26 MED FILL — TAMSULOSIN HCL 0.4 MG CAP: 0.4 | 30 days supply | Qty: 30 | Fill #1

## 2016-02-28 NOTE — Telephone Encounter (Signed)
Returned pt call  Pt stated automatic machine call with our office number  Pt notified we do not have an automatic voice call. Pt verbalized understanding

## 2016-03-13 MED FILL — ATORVASTATIN 40 MG TABLET: 40 | 30 days supply | Qty: 30 | Fill #1

## 2016-03-13 MED FILL — FAMOTIDINE 20 MG TABLET: 20 | 30 days supply | Qty: 60 | Fill #1

## 2016-03-25 MED FILL — TAMSULOSIN HCL 0.4 MG CAP: 0.4 | 30 days supply | Qty: 30 | Fill #2

## 2016-03-25 MED FILL — CARVEDILOL 3.125 MG TABLET: 3.125 | 30 days supply | Qty: 60 | Fill #2

## 2016-03-25 MED FILL — CLOPIDOGREL 75 MG TABLET: 75 | 30 days supply | Qty: 30 | Fill #1

## 2016-04-10 MED FILL — ATORVASTATIN 40 MG TABLET: 40 | 30 days supply | Qty: 30 | Fill #2

## 2016-04-10 MED FILL — FAMOTIDINE 20 MG TABLET: 20 | 30 days supply | Qty: 60 | Fill #2

## 2016-04-25 MED FILL — CLOPIDOGREL 75 MG TABLET: 75 | 30 days supply | Qty: 30 | Fill #2

## 2016-04-25 MED FILL — TAMSULOSIN HCL 0.4 MG CAP: 0.4 | 30 days supply | Qty: 30 | Fill #3

## 2016-04-25 MED FILL — CARVEDILOL 3.125 MG TABLET: 3.125 | 30 days supply | Qty: 60 | Fill #3

## 2016-05-10 MED FILL — ATORVASTATIN 40 MG TABLET: 40 | 30 days supply | Qty: 30 | Fill #3

## 2016-05-10 MED FILL — FAMOTIDINE 20 MG TABLET: 20 | 30 days supply | Qty: 60 | Fill #3

## 2016-05-24 MED FILL — CARVEDILOL 3.125 MG TABLET: 3.125 | 30 days supply | Qty: 60 | Fill #4

## 2016-05-24 MED FILL — CLOPIDOGREL 75 MG TABLET: 75 | 30 days supply | Qty: 30 | Fill #3

## 2016-05-24 MED FILL — TAMSULOSIN HCL 0.4 MG CAP: 0.4 | 30 days supply | Qty: 30 | Fill #4

## 2016-06-11 MED FILL — ATORVASTATIN 40 MG TABLET: 40 | 30 days supply | Qty: 30 | Fill #4

## 2016-06-11 MED FILL — FAMOTIDINE 20 MG TABLET: 20 | 30 days supply | Qty: 60 | Fill #4

## 2016-06-27 MED FILL — TAMSULOSIN HCL 0.4 MG CAP: 0.4 | 30 days supply | Qty: 30 | Fill #5

## 2016-06-27 MED FILL — CARVEDILOL 3.125 MG TABLET: 3.125 | 30 days supply | Qty: 60 | Fill #5

## 2016-06-27 MED FILL — CLOPIDOGREL 75 MG TABLET: 75 | 30 days supply | Qty: 30 | Fill #4

## 2016-07-11 MED FILL — FAMOTIDINE 20 MG TABLET: 20 | 30 days supply | Qty: 60 | Fill #5

## 2016-07-11 MED FILL — ATORVASTATIN 40 MG TABLET: 40 | 30 days supply | Qty: 30 | Fill #5

## 2016-07-29 MED FILL — CLOPIDOGREL 75 MG TABLET: 75 | 30 days supply | Qty: 30 | Fill #5

## 2016-07-29 MED FILL — CARVEDILOL 3.125 MG TABLET: 3.125 | 30 days supply | Qty: 60 | Fill #6

## 2016-07-31 MED FILL — TAMSULOSIN HCL 0.4 MG CAP: 0.4 | 30 days supply | Qty: 30 | Fill #6

## 2016-08-09 MED FILL — ATORVASTATIN 40 MG TABLET: 40 | 30 days supply | Qty: 30 | Fill #6

## 2016-08-13 MED FILL — FAMOTIDINE 20 MG TABLET: 20 | 60 days supply | Qty: 120 | Fill #6

## 2016-08-16 ENCOUNTER — Other Ambulatory Visit: Payer: Self-pay

## 2016-08-27 MED FILL — CARVEDILOL 3.125 MG TABLET: 3.125 | 30 days supply | Qty: 60 | Fill #7

## 2016-08-27 MED FILL — CLOPIDOGREL 75 MG TABLET: 75 | 30 days supply | Qty: 30 | Fill #6

## 2016-09-05 MED FILL — TAMSULOSIN HCL 0.4 MG CAP: 0.4 | 90 days supply | Qty: 90 | Fill #7

## 2016-09-13 MED FILL — ATORVASTATIN 40 MG TABLET: 40 | 90 days supply | Qty: 90 | Fill #7

## 2016-09-27 MED FILL — CARVEDILOL 3.125 MG TABLET: 3.125 | 90 days supply | Qty: 180 | Fill #8

## 2016-09-27 MED FILL — CLOPIDOGREL 75 MG TABLET: 75 | 90 days supply | Qty: 90 | Fill #7

## 2016-10-14 MED FILL — FAMOTIDINE 20 MG TABLET: 20 | 60 days supply | Qty: 120 | Fill #7

## 2016-11-15 ENCOUNTER — Encounter: Payer: Self-pay | Admitting: Family Medicine

## 2016-11-15 ENCOUNTER — Other Ambulatory Visit: Payer: Self-pay

## 2016-11-15 ENCOUNTER — Ambulatory Visit: Payer: Medicare Other | Attending: Family Medicine | Admitting: Family Medicine

## 2016-11-15 VITALS — BP 128/77 | HR 54 | Temp 98.1°F | Ht 64.0 in | Wt 211.0 lb

## 2016-11-15 DIAGNOSIS — N400 Enlarged prostate without lower urinary tract symptoms: Secondary | ICD-10-CM

## 2016-11-15 DIAGNOSIS — I2510 Atherosclerotic heart disease of native coronary artery without angina pectoris: Secondary | ICD-10-CM | POA: Diagnosis not present

## 2016-11-15 DIAGNOSIS — M79602 Pain in left arm: Secondary | ICD-10-CM | POA: Diagnosis not present

## 2016-11-15 DIAGNOSIS — K219 Gastro-esophageal reflux disease without esophagitis: Secondary | ICD-10-CM | POA: Diagnosis not present

## 2016-11-15 DIAGNOSIS — F17210 Nicotine dependence, cigarettes, uncomplicated: Secondary | ICD-10-CM | POA: Diagnosis not present

## 2016-11-15 DIAGNOSIS — E785 Hyperlipidemia, unspecified: Secondary | ICD-10-CM

## 2016-11-15 DIAGNOSIS — I10 Essential (primary) hypertension: Secondary | ICD-10-CM

## 2016-11-15 DIAGNOSIS — F17290 Nicotine dependence, other tobacco product, uncomplicated: Secondary | ICD-10-CM | POA: Diagnosis not present

## 2016-11-15 DIAGNOSIS — Z7982 Long term (current) use of aspirin: Secondary | ICD-10-CM | POA: Insufficient documentation

## 2016-11-15 DIAGNOSIS — Z79899 Other long term (current) drug therapy: Secondary | ICD-10-CM | POA: Diagnosis not present

## 2016-11-15 LAB — COMPLETE METABOLIC PANEL WITH GFR
ALBUMIN: 4.2 g/dL (ref 3.6–5.1)
ALK PHOS: 68 U/L (ref 40–115)
ALT: 11 U/L (ref 9–46)
AST: 14 U/L (ref 10–35)
BUN: 8 mg/dL (ref 7–25)
CALCIUM: 9 mg/dL (ref 8.6–10.3)
CHLORIDE: 106 mmol/L (ref 98–110)
CO2: 29 mmol/L (ref 20–31)
Creat: 0.96 mg/dL (ref 0.70–1.25)
GFR, EST NON AFRICAN AMERICAN: 81 mL/min (ref >=60)
Glucose, Bld: 115 mg/dL — ABNORMAL HIGH (ref 65–99)
POTASSIUM: 4 mmol/L (ref 3.5–5.3)
Sodium: 140 mmol/L (ref 135–146)
Total Bilirubin: 1 mg/dL (ref 0.2–1.2)
Total Protein: 6.7 g/dL (ref 6.1–8.1)

## 2016-11-15 MED ORDER — TAMSULOSIN HCL 0.4 MG PO CAPS: 0.4000 mg | ORAL_CAPSULE | Freq: Every day | ORAL | 3 refills | Status: DC

## 2016-11-15 MED ORDER — FAMOTIDINE 20 MG PO TABS: 20.0000 mg | ORAL_TABLET | Freq: Two times a day (BID) | ORAL | 3 refills | Status: DC

## 2016-11-15 MED ORDER — CLOPIDOGREL BISULFATE 75 MG PO TABS: 75.0000 mg | ORAL_TABLET | Freq: Every day | ORAL | 3 refills | Status: DC

## 2016-11-15 MED ORDER — ASPIRIN 325 MG PO TBEC: 325.0000 mg | DELAYED_RELEASE_TABLET | Freq: Every day | ORAL | 3 refills | Status: DC

## 2016-11-15 MED ORDER — NICOTINE POLACRILEX 4 MG MT GUM: 8.0000 mg | CHEWING_GUM | OROMUCOSAL | 2 refills | Status: DC | PRN

## 2016-11-15 MED ORDER — ATORVASTATIN CALCIUM 40 MG PO TABS: 40.0000 mg | ORAL_TABLET | Freq: Every day | ORAL | 3 refills | Status: DC

## 2016-11-15 MED ORDER — DICLOFENAC SODIUM 1 % TD GEL: 4.0000 g | Freq: Four times a day (QID) | TRANSDERMAL | 2 refills | Status: DC

## 2016-11-15 MED ORDER — CARVEDILOL 3.125 MG PO TABS: 3.1250 mg | ORAL_TABLET | Freq: Two times a day (BID) | ORAL | 3 refills | Status: DC

## 2016-11-15 MED FILL — VOLTAREN 1% GEL: 1 | 6 days supply | Qty: 100 | Fill #0

## 2016-11-15 NOTE — Patient Instructions (Addendum)
Glenard was seen today for hypertension.  Diagnoses and all orders for this visit:  Benign prostatic hyperplasia, unspecified whether lower urinary tract symptoms present -     tamsulosin (FLOMAX) 0.4 MG CAPS capsule; Take 1 capsule (0.4 mg total) by mouth daily.  Other tobacco product nicotine dependence, uncomplicated -     nicotine polacrilex (NICORETTE) 4 MG gum; Take 2 each (8 mg total) by mouth as needed for smoking cessation.  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 -     carvedilol (COREG) 3.125 MG tablet; Take 1 tablet (3.125 mg total) by mouth 2 (two) times daily with a meal. -     clopidogrel (PLAVIX) 75 MG tablet; Take 1 tablet (75 mg total) by mouth daily. -     EKG 12-Lead  Hyperlipidemia LDL goal <100 -     atorvastatin (LIPITOR) 40 MG tablet; Take 1 tablet (40 mg total) by mouth at bedtime.  Essential hypertension -     carvedilol (COREG) 3.125 MG tablet; Take 1 tablet (3.125 mg total) by mouth 2 (two) times daily with a meal.  Gastroesophageal reflux disease without esophagitis -     famotidine (PEPCID) 20 MG tablet; Take 1 tablet (20 mg total) by mouth 2 (two) times daily.  Left arm pain -     diclofenac sodium (VOLTAREN) 1 % GEL; Apply 4 g topically 4 (four) times daily. To left upper back and under left arm    F/u in 3 months for HTN  Dr. Adrian Blackwater

## 2016-11-15 NOTE — Progress Notes (Signed)
Subjective:  Patient ID: Samuel Lawson, male    DOB: January 05, 1946  Age: 71 y.o. MRN: [id number]  CC: Hypertension   HPI Samuel Lawson has hx of CAD, HTN, smoking he presents for   1.Hypertension: no headaches, chest pain, shortness of breath. Compliant with regimen.    2. Spasms: in L under arm and upper back. Was present for one week. Then resolved. No pain now. Could see and feel the spasm under his L arm. No trauma. He had similar symptoms last year that lasted for just 2-3 days then resolved.   3. Smoking: he is now smoking 5 cigs per day after quitting for one year. Desires to quit. No CP or SOB. Request refills of nicorette gum.    Social History  Substance Use Topics  . Smoking status: Current Every Day Smoker    Packs/day: 0.50    Types: Cigarettes  . Smokeless tobacco: Never Used     Comment: states new years resolution is to quit smoking  . Alcohol use No   Outpatient Medications Prior to Visit  Medication Sig Dispense Refill  . aspirin 325 MG EC tablet Take 1 tablet (325 mg total) by mouth daily. 90 tablet 2  . atorvastatin (LIPITOR) 40 MG tablet Take 1 tablet (40 mg total) by mouth at bedtime. 90 tablet 3  . carvedilol (COREG) 3.125 MG tablet Take 1 tablet (3.125 mg total) by mouth 2 (two) times daily with a meal. 180 tablet 3  . clopidogrel (PLAVIX) 75 MG tablet Take 1 tablet (75 mg total) by mouth daily. 90 tablet 3  . famotidine (PEPCID) 20 MG tablet Take 1 tablet (20 mg total) by mouth 2 (two) times daily. 180 tablet 3  . fish oil-omega-3 fatty acids 1000 MG capsule Take 1 g by mouth 2 (two) times daily.     . nitroGLYCERIN (NITROSTAT) 0.4 MG SL tablet Place 1 tablet (0.4 mg total) under the tongue every 5 (five) minutes as needed for chest pain. 20 tablet 3  . tamsulosin (FLOMAX) 0.4 MG CAPS capsule Take 1 capsule (0.4 mg total) by mouth daily. 90 capsule 3  . triamcinolone cream (KENALOG) 0.1 % Apply 1 application topically 2 (two) times daily. Apply to R hand  80 g 2  . nicotine polacrilex (NICORETTE) 2 MG gum Take 1 each (2 mg total) by mouth as needed for smoking cessation. (Patient not taking: Reported on 11/15/2016) 100 tablet 5   No facility-administered medications prior to visit.     ROS Review of Systems  Constitutional: Negative for chills, fatigue, fever and unexpected weight change.  Eyes: Negative for visual disturbance.  Respiratory: Negative for cough and shortness of breath.   Cardiovascular: Negative for chest pain, palpitations and leg swelling.  Gastrointestinal: Negative for abdominal pain, blood in stool, constipation, diarrhea, nausea and vomiting.  Endocrine: Negative for polydipsia, polyphagia and polyuria.  Musculoskeletal: Positive for myalgias. Negative for arthralgias, back pain, gait problem and neck pain.  Skin: Positive for rash (on hand ).  Allergic/Immunologic: Negative for immunocompromised state.  Hematological: Negative for adenopathy. Does not bruise/bleed easily.  Psychiatric/Behavioral: Negative for dysphoric mood, sleep disturbance and suicidal ideas. The patient is not nervous/anxious.     Objective:  BP 128/77 (BP Location: Left Arm, Patient Position: Sitting, Cuff Size: Small)   Pulse (!) 54   Temp 98.1 F (36.7 C) (Oral)   Ht 5\' 4"  (1.626 m)   Wt 211 lb (95.7 kg)   SpO2 98%   BMI 36.22 kg/m  BP/Weight 11/15/2016 01/22/2016 36/09/4429  Systolic BP 540 086 761  Diastolic BP 77 66 72  Wt. (Lbs) 211 214 206.4  BMI 36.22 36.72 35.41   Physical Exam  Constitutional: He appears well-developed and well-nourished. No distress.  HENT:  Head: Normocephalic and atraumatic.  Neck: Normal range of motion. Neck supple.  Cardiovascular: Normal rate, regular rhythm, normal heart sounds and intact distal pulses.   Pulmonary/Chest: Effort normal and breath sounds normal.  Musculoskeletal: He exhibits no edema.  Neurological: He is alert.  Skin: Skin is warm and dry. No erythema.  Psychiatric: He has a  normal mood and affect.   EKG: unchanged from previous tracings, sinus bradycardia, 1st degree AV block. Assessment & Plan:  [place] was seen today for hypertension.  Diagnoses and all orders for this visit:  Benign prostatic hyperplasia, unspecified whether lower urinary tract symptoms present -     tamsulosin (FLOMAX) 0.4 MG CAPS capsule; Take 1 capsule (0.4 mg total) by mouth daily.  Other tobacco product nicotine dependence, uncomplicated -     nicotine polacrilex (NICORETTE) 4 MG gum; Take 2 each (8 mg total) by mouth as needed for smoking cessation.  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 -     carvedilol (COREG) 3.125 MG tablet; Take 1 tablet (3.125 mg total) by mouth 2 (two) times daily with a meal. -     clopidogrel (PLAVIX) 75 MG tablet; Take 1 tablet (75 mg total) by mouth daily. -     EKG 12-Lead  Hyperlipidemia LDL goal <100 -     atorvastatin (LIPITOR) 40 MG tablet; Take 1 tablet (40 mg total) by mouth at bedtime.  Essential hypertension -     carvedilol (COREG) 3.125 MG tablet; Take 1 tablet (3.125 mg total) by mouth 2 (two) times daily with a meal.  Gastroesophageal reflux disease without esophagitis -     famotidine (PEPCID) 20 MG tablet; Take 1 tablet (20 mg total) by mouth 2 (two) times daily.  Left arm pain -     diclofenac sodium (VOLTAREN) 1 % GEL; Apply 4 g topically 4 (four) times daily. To left upper back and under left arm -     COMPLETE METABOLIC PANEL WITH GFR   There are no diagnoses linked to this encounter.  No orders of the defined types were placed in this encounter.   Follow-up: Return in about 3 months (around 02/15/2017) for HTN .   Boykin Nearing MD

## 2016-11-19 DIAGNOSIS — M79602 Pain in left arm: Secondary | ICD-10-CM | POA: Insufficient documentation

## 2016-11-19 NOTE — Assessment & Plan Note (Signed)
Stable with stable EKG. No angina Continue secondary prevention with antiplatelet therapy, statin, beta blocker. Smoking cessation.

## 2016-11-19 NOTE — Assessment & Plan Note (Signed)
Well-controlled.  Continue current regimen. 

## 2016-11-19 NOTE — Assessment & Plan Note (Signed)
Muscle spasms Stable EKG Patient declines muscle relaxer Plan: voltaren gel CMP to check electrolytes

## 2016-11-20 ENCOUNTER — Telehealth: Payer: Self-pay

## 2016-11-20 NOTE — Telephone Encounter (Signed)
Pt was called and has a VM that has not been set up .

## 2016-11-28 ENCOUNTER — Telehealth: Payer: Self-pay

## 2016-11-28 NOTE — Telephone Encounter (Signed)
Pt was called for a second time and there is no VM set up to leave a message.

## 2016-12-04 MED FILL — TAMSULOSIN HCL 0.4 MG CAP: 0.4 | 60 days supply | Qty: 60 | Fill #8

## 2016-12-10 MED FILL — ATORVASTATIN 40 MG TABLET: 40 | 90 days supply | Qty: 90 | Fill #0

## 2016-12-10 MED FILL — FAMOTIDINE 20 MG TABLET: 20 | 90 days supply | Qty: 180 | Fill #0

## 2016-12-11 MED FILL — CARVEDILOL 3.125 MG TABLET: 3.125 | 90 days supply | Qty: 180 | Fill #0

## 2016-12-11 MED FILL — CLOPIDOGREL 75 MG TABLET: 75 | 90 days supply | Qty: 90 | Fill #0

## 2017-01-21 ENCOUNTER — Ambulatory Visit: Payer: Medicare Other | Attending: Family Medicine | Admitting: Family Medicine

## 2017-01-21 ENCOUNTER — Encounter: Payer: Self-pay | Admitting: Family Medicine

## 2017-01-21 VITALS — BP 110/60 | HR 59 | Temp 98.0°F | Ht 64.0 in | Wt 208.6 lb

## 2017-01-21 DIAGNOSIS — Z6835 Body mass index (BMI) 35.0-35.9, adult: Secondary | ICD-10-CM | POA: Diagnosis not present

## 2017-01-21 DIAGNOSIS — F17210 Nicotine dependence, cigarettes, uncomplicated: Secondary | ICD-10-CM | POA: Insufficient documentation

## 2017-01-21 DIAGNOSIS — R5383 Other fatigue: Secondary | ICD-10-CM | POA: Diagnosis not present

## 2017-01-21 DIAGNOSIS — Z7982 Long term (current) use of aspirin: Secondary | ICD-10-CM | POA: Diagnosis not present

## 2017-01-21 DIAGNOSIS — E669 Obesity, unspecified: Secondary | ICD-10-CM

## 2017-01-21 DIAGNOSIS — I10 Essential (primary) hypertension: Secondary | ICD-10-CM | POA: Diagnosis present

## 2017-01-21 DIAGNOSIS — I2510 Atherosclerotic heart disease of native coronary artery without angina pectoris: Secondary | ICD-10-CM | POA: Diagnosis not present

## 2017-01-21 DIAGNOSIS — M25562 Pain in left knee: Secondary | ICD-10-CM

## 2017-01-21 LAB — GLUCOSE, POCT (MANUAL RESULT ENTRY): POC GLUCOSE: 116 mg/dL — AB (ref 70–99)

## 2017-01-21 LAB — POCT GLYCOSYLATED HEMOGLOBIN (HGB A1C): HEMOGLOBIN A1C: 6

## 2017-01-21 NOTE — Progress Notes (Signed)
Subjective:  Patient ID: Samuel Lawson, male    DOB: 1946-03-05  Age: 71 y.o. MRN: [id number]  CC: Hypertension and Knee Pain   HPI Samuel Lawson has hx of CAD, HTN, smoking he presents for   1.Hypertension: no headaches, chest pain, shortness of breath. Compliant with regimen.    2. L medial knee pain: x 1 month. Reports twisting injury where he was trying to move a microwave to the counter and pivoted onto his L knee. He placed all of his weight on his L knee. Pain has improved but is now 0/10. He has intermittent sharp pains when he steps. No falls. No sensation of joint laxity.   Social History  Substance Use Topics  . Smoking status: Current Every Day Smoker    Packs/day: 0.50    Types: Cigarettes  . Smokeless tobacco: Never Used     Comment: states new years resolution is to quit smoking  . Alcohol use No   Outpatient Medications Prior to Visit  Medication Sig Dispense Refill  . aspirin 325 MG EC tablet Take 1 tablet (325 mg total) by mouth daily. 90 tablet 3  . atorvastatin (LIPITOR) 40 MG tablet Take 1 tablet (40 mg total) by mouth at bedtime. 90 tablet 3  . carvedilol (COREG) 3.125 MG tablet Take 1 tablet (3.125 mg total) by mouth 2 (two) times daily with a meal. 180 tablet 3  . clopidogrel (PLAVIX) 75 MG tablet Take 1 tablet (75 mg total) by mouth daily. 90 tablet 3  . diclofenac sodium (VOLTAREN) 1 % GEL Apply 4 g topically 4 (four) times daily. To left upper back and under left arm 100 g 2  . famotidine (PEPCID) 20 MG tablet Take 1 tablet (20 mg total) by mouth 2 (two) times daily. 180 tablet 3  . fish oil-omega-3 fatty acids 1000 MG capsule Take 1 g by mouth 2 (two) times daily.     . nicotine polacrilex (NICORETTE) 4 MG gum Take 2 each (8 mg total) by mouth as needed for smoking cessation. 100 each 2  . nitroGLYCERIN (NITROSTAT) 0.4 MG SL tablet Place 1 tablet (0.4 mg total) under the tongue every 5 (five) minutes as needed for chest pain. 20 tablet 3  .  tamsulosin (FLOMAX) 0.4 MG CAPS capsule Take 1 capsule (0.4 mg total) by mouth daily. 90 capsule 3  . triamcinolone cream (KENALOG) 0.1 % Apply 1 application topically 2 (two) times daily. Apply to R hand 80 g 2   No facility-administered medications prior to visit.     ROS Review of Systems  Constitutional: Positive for fatigue (after eating ). Negative for chills, fever and unexpected weight change.  Eyes: Negative for visual disturbance.  Respiratory: Negative for cough and shortness of breath.   Cardiovascular: Negative for chest pain, palpitations and leg swelling.  Gastrointestinal: Negative for abdominal pain, blood in stool, constipation, diarrhea, nausea and vomiting.  Endocrine: Negative for polydipsia, polyphagia and polyuria.  Musculoskeletal: Positive for arthralgias and myalgias. Negative for back pain, gait problem and neck pain.  Allergic/Immunologic: Negative for immunocompromised state.  Hematological: Negative for adenopathy. Does not bruise/bleed easily.  Psychiatric/Behavioral: Negative for dysphoric mood, sleep disturbance and suicidal ideas. The patient is not nervous/anxious.     Objective:  BP 110/60   Pulse (!) 59   Temp 98 F (36.7 C) (Oral)   Ht 5\' 4"  (1.626 m)   Wt 208 lb 9.6 oz (94.6 kg)   SpO2 98%   BMI 35.81 kg/m  BP/Weight 01/21/2017 11/15/2016 1/94/7125  Systolic BP 271 292 909  Diastolic BP 60 77 66  Wt. (Lbs) 208.6 211 214  BMI 35.81 36.22 36.72   Pulse Readings from Last 3 Encounters:  01/21/17 (!) 59  11/15/16 (!) 54  01/22/16 67    Physical Exam  Constitutional: He appears well-developed and well-nourished. No distress.  obese  HENT:  Head: Normocephalic and atraumatic.  Neck: Normal range of motion. Neck supple.  Cardiovascular: Normal rate, regular rhythm, normal heart sounds and intact distal pulses.   Pulmonary/Chest: Effort normal and breath sounds normal.  Musculoskeletal: He exhibits no edema.       Legs: Neurological:  He is alert.  Skin: Skin is warm and dry. No erythema.  Psychiatric: He has a normal mood and affect.   Lab Results  Component Value Date   HGBA1C 6.0 01/21/2017     Assessment & Plan:  Samuel Lawson was seen today for hypertension and knee pain.  Diagnoses and all orders for this visit:  Essential hypertension  Obesity (BMI 30-39.9)  Other fatigue -     POCT glycosylated hemoglobin (Hb A1C) -     Glucose (CBG)  Left medial knee pain   There are no diagnoses linked to this encounter.  No orders of the defined types were placed in this encounter.   Follow-up: Return in about 3 months (around 04/23/2017) for HTN.   Boykin Nearing MD

## 2017-01-21 NOTE — Patient Instructions (Addendum)
[place] was seen today for hypertension and knee pain.  Diagnoses and all orders for this visit:  Essential hypertension  Obesity (BMI 30-39.9)  Other fatigue -     POCT glycosylated hemoglobin (Hb A1C) -     Glucose (CBG)  Left medial knee pain   Knee Pain, Adult Many things can cause knee pain. The pain often goes away on its own with time and rest. If the pain does not go away, tests may be done to find out what is causing the pain. Follow these instructions at home: Activity   Rest your knee.  Do not do things that cause pain.  Avoid activities where both feet leave the ground at the same time (high-impact activities). Examples are running, jumping rope, and doing jumping jacks. General instructions   Take medicines only as told by your doctor.  Raise (elevate) your knee when you are resting. Make sure your knee is higher than your heart.  Sleep with a pillow under your knee.  If told, put ice on the knee:  Put ice in a plastic bag.  Place a towel between your skin and the bag.  Leave the ice on for 20 minutes, 2-3 times a day.  Ask your doctor if you should wear an elastic knee support.  Lose weight if you are overweight. Being overweight can make your knee hurt more.  Do not use any tobacco products. These include cigarettes, chewing tobacco, or electronic cigarettes. If you need help quitting, ask your doctor. Smoking may slow down healing. Contact a doctor if:  The pain does not stop.  The pain changes or gets worse.  You have a fever along with knee pain.  Your knee gives out or locks up.  Your knee swells, and becomes worse. Get help right away if:  Your knee feels warm.  You cannot move your knee.  You have very bad knee pain.  You have chest pain.  You have trouble breathing. Summary  Many things can cause knee pain. The pain often goes away on its own with time and rest.  Avoid activities that put stress on your knee. These include  running and jumping rope.  Get help right away if you cannot move your knee, or if your knee feels warm, or if you have trouble breathing. This information is not intended to replace advice given to you by your health care provider. Make sure you discuss any questions you have with your health care provider. Document Released: 11/22/2008 Document Revised: 08/20/2016 Document Reviewed: 08/20/2016 Elsevier Interactive Patient Education  2017 [place] Left knee for 20 minutes 1-2 times per day Do exercise to help strenghteneing the joint  Dr. Adrian Blackwater

## 2017-01-22 ENCOUNTER — Encounter: Payer: Self-pay | Admitting: Family Medicine

## 2017-01-22 DIAGNOSIS — M25562 Pain in left knee: Secondary | ICD-10-CM | POA: Insufficient documentation

## 2017-01-22 NOTE — Assessment & Plan Note (Signed)
Left medial knee pain following twisting injury Suspect tendon injury Plan: RICE therapy

## 2017-01-22 NOTE — Assessment & Plan Note (Signed)
A: BP controlled Med: compliant P: Continue coreg 3.125 mg BID and aspirin

## 2017-02-05 ENCOUNTER — Other Ambulatory Visit: Payer: Self-pay | Admitting: Family Medicine

## 2017-02-05 DIAGNOSIS — N400 Enlarged prostate without lower urinary tract symptoms: Secondary | ICD-10-CM

## 2017-02-11 ENCOUNTER — Other Ambulatory Visit: Payer: Self-pay | Admitting: Family Medicine

## 2017-02-11 DIAGNOSIS — N400 Enlarged prostate without lower urinary tract symptoms: Secondary | ICD-10-CM

## 2017-02-11 MED FILL — TAMSULOSIN HCL 0.4 MG CAP: 0.4 | 90 days supply | Qty: 90 | Fill #0

## 2017-02-23 IMAGING — CR DG CHEST 2V
2 series · 2 of 2 positions shown · non-contrast
Comparison: 10/07/2014

CLINICAL DATA: Dizziness.  Left chest pain yesterday.

EXAM:
CHEST  2 VIEW

[w chest pa]
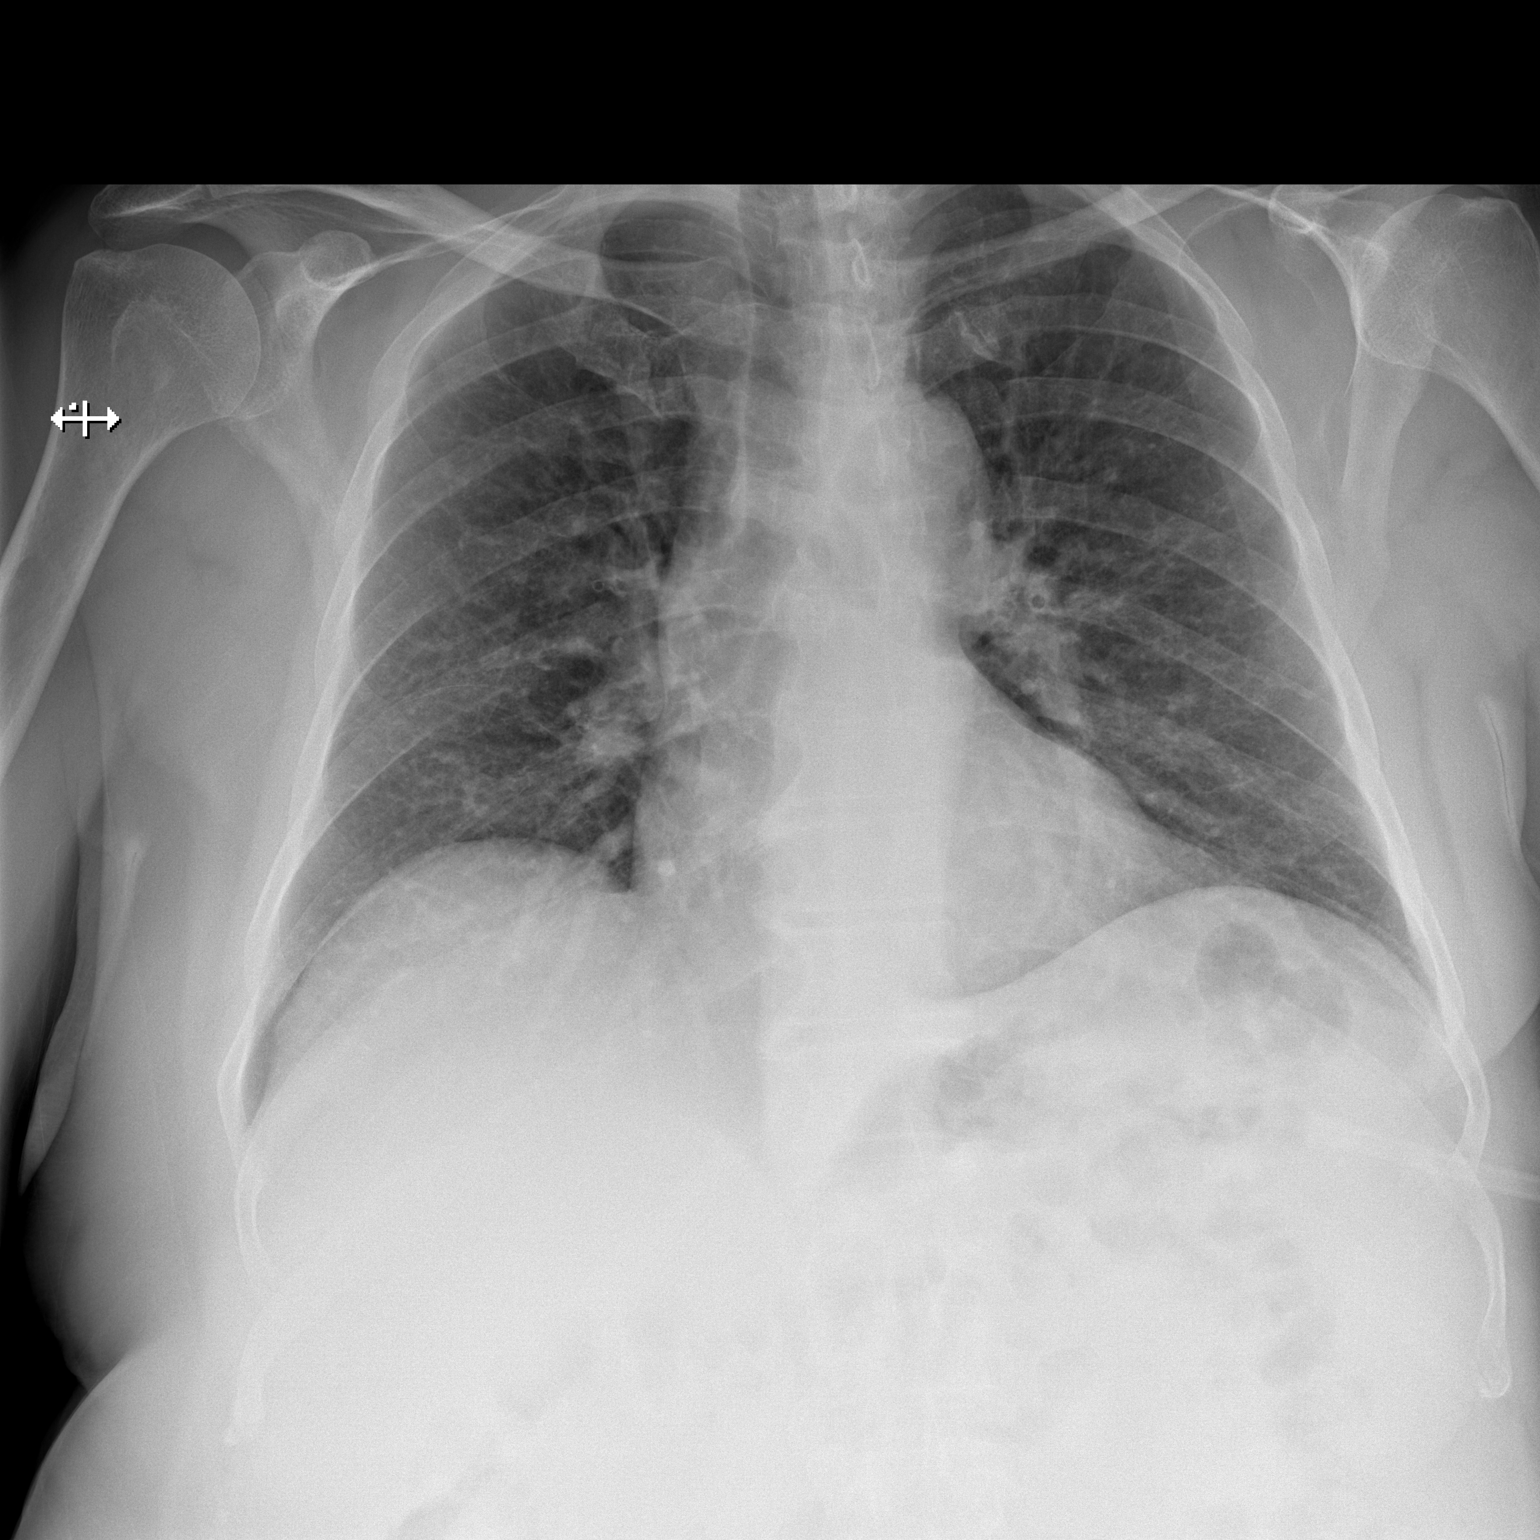

[w chest lat]
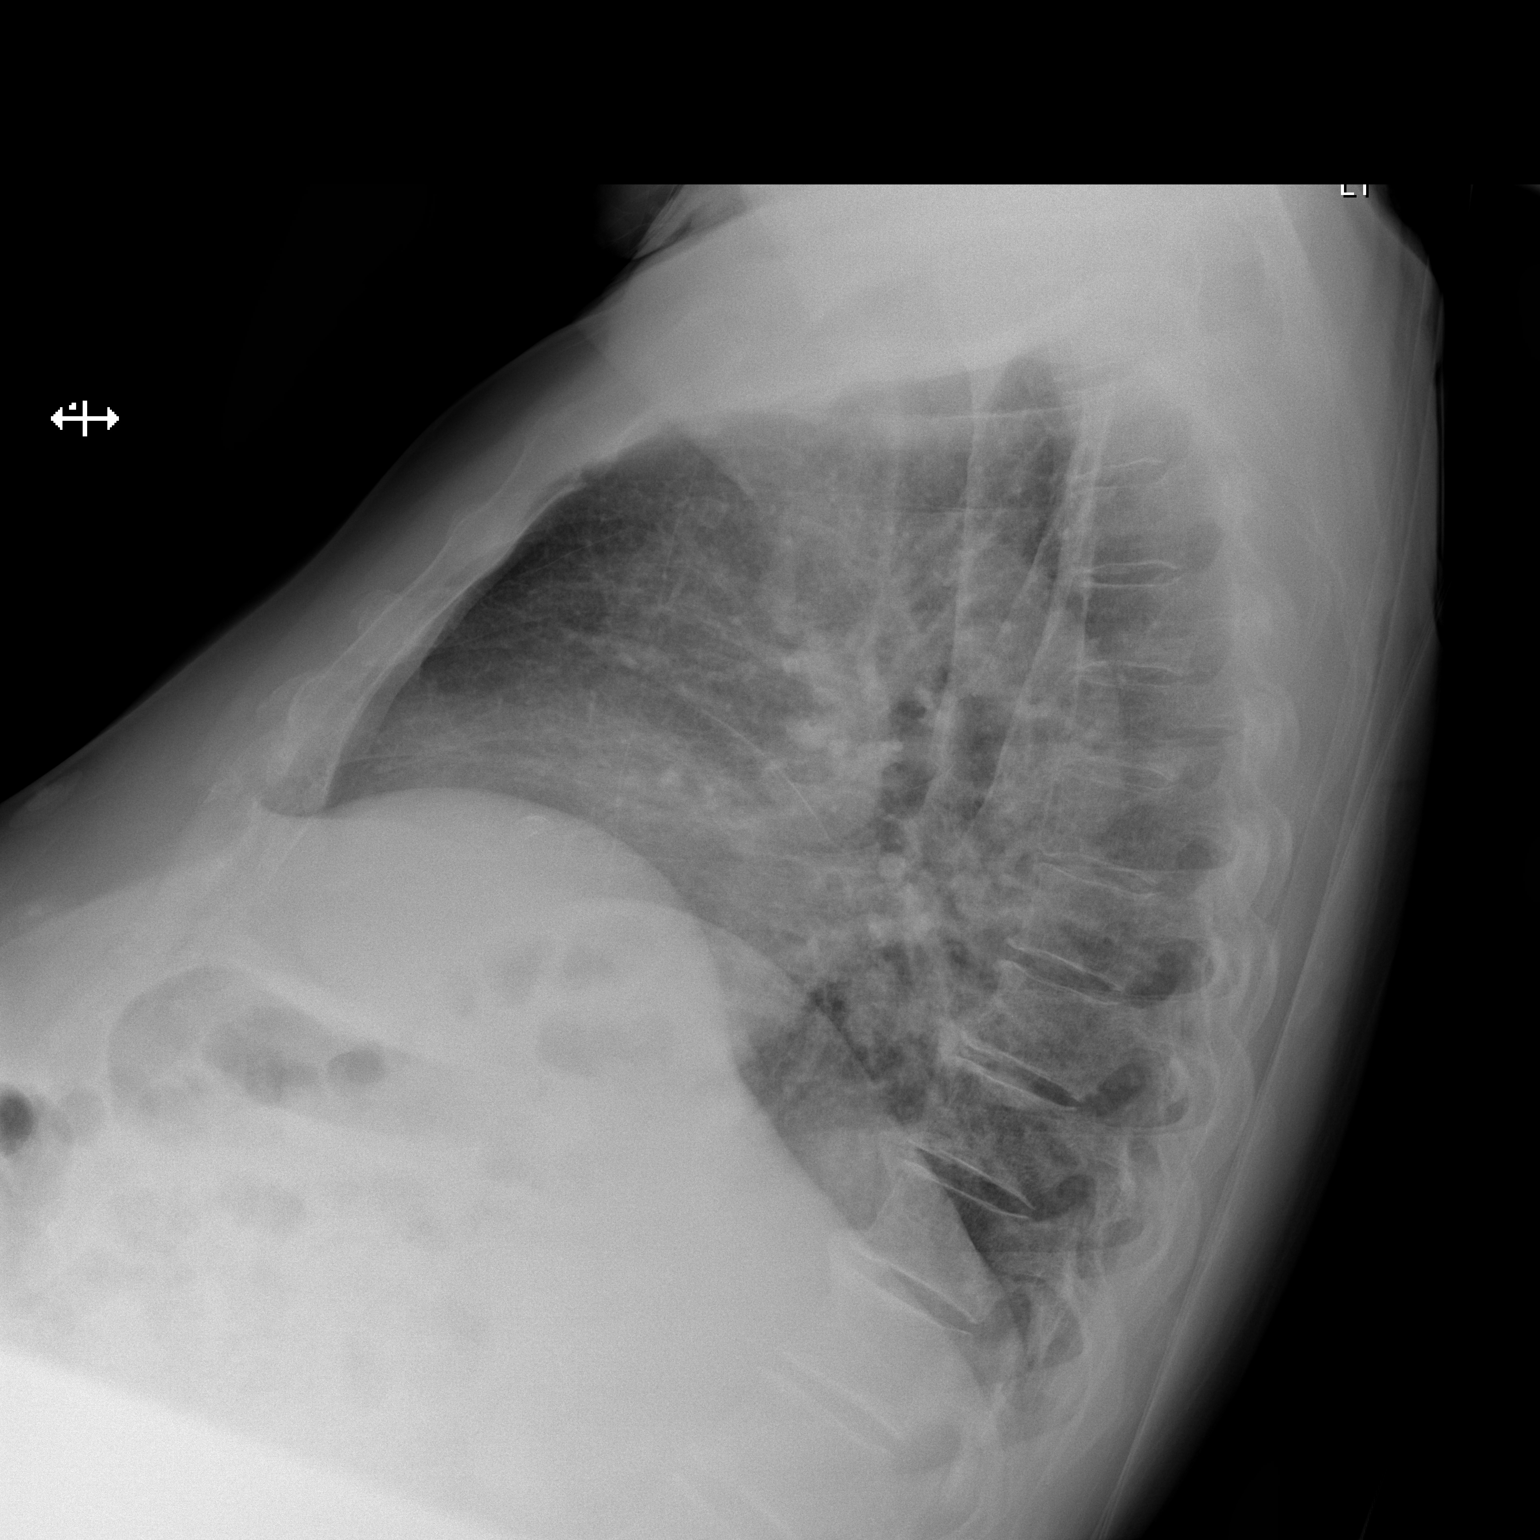

[2 of 2 positions shown; findings below may reference images not displayed]

FINDINGS: Airway thickening is present, suggesting bronchitis or reactive
airways disease. Thoracic spondylosis. The patient is rotated to the
right on today's radiograph, reducing diagnostic sensitivity and
specificity. No pleural effusion. No discrete airspace opacity.
IMPRESSION: 1. Airway thickening is present, suggesting bronchitis or reactive
airways disease.

## 2017-03-13 MED FILL — ATORVASTATIN 40 MG TABLET: 40 | 90 days supply | Qty: 90 | Fill #1

## 2017-03-13 MED FILL — CARVEDILOL 3.125 MG TABLET: 3.125 | 90 days supply | Qty: 180 | Fill #1

## 2017-03-13 MED FILL — FAMOTIDINE 20 MG TABLET: 20 | 90 days supply | Qty: 180 | Fill #1

## 2017-04-01 ENCOUNTER — Encounter (HOSPITAL_COMMUNITY): Payer: Self-pay

## 2017-04-01 ENCOUNTER — Emergency Department (HOSPITAL_COMMUNITY)
Admission: EM | Admit: 2017-04-01 | Discharge: 2017-04-01 | Disposition: A | Discharge status: Home / Self Care | Payer: Medicare Other | Attending: Emergency Medicine | Admitting: Emergency Medicine

## 2017-04-01 ENCOUNTER — Other Ambulatory Visit: Payer: Self-pay

## 2017-04-01 DIAGNOSIS — R42 Dizziness and giddiness: Secondary | ICD-10-CM | POA: Insufficient documentation

## 2017-04-01 DIAGNOSIS — F17210 Nicotine dependence, cigarettes, uncomplicated: Secondary | ICD-10-CM | POA: Insufficient documentation

## 2017-04-01 DIAGNOSIS — E785 Hyperlipidemia, unspecified: Secondary | ICD-10-CM | POA: Insufficient documentation

## 2017-04-01 DIAGNOSIS — G4489 Other headache syndrome: Secondary | ICD-10-CM | POA: Diagnosis not present

## 2017-04-01 DIAGNOSIS — E119 Type 2 diabetes mellitus without complications: Secondary | ICD-10-CM | POA: Insufficient documentation

## 2017-04-01 DIAGNOSIS — Z7982 Long term (current) use of aspirin: Secondary | ICD-10-CM | POA: Diagnosis not present

## 2017-04-01 DIAGNOSIS — R404 Transient alteration of awareness: Secondary | ICD-10-CM | POA: Diagnosis not present

## 2017-04-01 DIAGNOSIS — I10 Essential (primary) hypertension: Secondary | ICD-10-CM | POA: Insufficient documentation

## 2017-04-01 HISTORY — DX: Type 2 diabetes mellitus without complications: E11.9

## 2017-04-01 LAB — I-STAT CHEM 8, ED
BUN: 17 mg/dL (ref 6–20)
Calcium, Ion: 1.22 mmol/L (ref 1.15–1.40)
Chloride: 106 mmol/L (ref 101–111)
Creatinine, Ser: 1 mg/dL (ref 0.61–1.24)
Glucose, Bld: 115 mg/dL — ABNORMAL HIGH (ref 65–99)
HCT: 45 % (ref 39.0–52.0)
Hemoglobin: 15.3 g/dL (ref 13.0–17.0)
Potassium: 4.5 mmol/L (ref 3.5–5.1)
Sodium: 142 mmol/L (ref 135–145)
TCO2: 27 mmol/L (ref 0–100)

## 2017-04-01 MED ORDER — MECLIZINE HCL 25 MG PO TABS: 25.0000 mg | ORAL_TABLET | Freq: Three times a day (TID) | ORAL | 0 refills | Status: DC | PRN

## 2017-04-01 MED ORDER — MECLIZINE HCL 25 MG PO TABS
25.0000 mg | ORAL_TABLET | Freq: Once | ORAL | Status: AC
Start: 2017-04-01 — End: 2017-04-01
  Administered 2017-04-01: 25 mg via ORAL
  Filled 2017-04-01: qty 1

## 2017-04-01 MED FILL — TRAVEL SICKNESS 25 MG TAB C: 25 | 10 days supply | Qty: 30 | Fill #0

## 2017-04-01 NOTE — ED Triage Notes (Signed)
Pt BIB [facility] from work where pt had syncopal episode that was witnessed. Bystanders deny trauma. Pt reports he has been dizzy and having vision trouble for 2 days stating "if I focus on something it floats in the air". Pt denies pain.

## 2017-04-01 NOTE — ED Provider Notes (Signed)
[facility] DEPT Provider Note   CSN: [id number] Arrival date & time: 04/01/17  1512     History   Chief Complaint Chief Complaint  Patient presents with  . Dizziness  . Fall    HPI Samuel Lawson is a 71 y.o. male.  The history is provided by the patient.  Dizziness  Quality:  Room spinning and vertigo Severity:  Severe Onset quality:  Sudden Duration:  1 hour Timing:  Sporadic Progression:  Resolved Chronicity:  New Context: head movement and standing up   Relieved by:  Nothing Worsened by:  Movement and eye movement Associated symptoms: no chest pain, no headaches, no nausea, no shortness of breath, no vision changes and no vomiting   Risk factors: heart disease   Risk factors comment:  Recent URI sx  Patient reports that he had one episode yesterday lasting 1 hour that resolved spontaneously on its own. This occurred whenever he turned his head to the right. He had another episode today just prior to arrival, while trying to stand from sitting position. This episode also lasted for approximately one hour and self resolved spontaneously.  The patient did report a fall but denied any head trauma.  Past Medical History:  Diagnosis Date  . 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 Collagen vascular disease ([place])   . Diabetes mellitus without complication ([place])   . Hyperlipidemia   . Hypertension     Patient Active Problem List   Diagnosis Date Noted  . Left medial knee pain 01/22/2017  . Left arm pain 11/19/2016  . BPH (benign prostatic hyperplasia) 07/19/2015  . Nicotine dependence 06/22/2015  . Orthostatic hypotension 12/14/2014  . Gastroesophageal reflux disease without esophagitis 03/09/2014  . Essential hypertension 03/09/2014  . Coronary artery disease involving native coronary artery of native heart without angina pectoris 08/25/2013  . Hyperlipidemia LDL goal <100 08/25/2013  . Obesity (BMI 30-39.9) 08/25/2013  . Disequilibrium 08/25/2013     Past Surgical History:  Procedure Laterality Date  . ANGIOPLASTY    . CARDIAC CATHETERIZATION         Home Medications    Prior to Admission medications   Medication Sig Start Date End Date Taking? Authorizing Provider  aspirin 325 MG EC tablet Take 1 tablet (325 mg total) by mouth daily. 11/15/16   Funches, Adriana Mccallum, MD  atorvastatin (LIPITOR) 40 MG tablet Take 1 tablet (40 mg total) by mouth at bedtime. 11/15/16   Boykin Nearing, MD  carvedilol (COREG) 3.125 MG tablet Take 1 tablet (3.125 mg total) by mouth 2 (two) times daily with a meal. 11/15/16   Funches, Josalyn, MD  clopidogrel (PLAVIX) 75 MG tablet Take 1 tablet (75 mg total) by mouth daily. 11/15/16   Funches, Adriana Mccallum, MD  diclofenac sodium (VOLTAREN) 1 % GEL Apply 4 g topically 4 (four) times daily. To left upper back and under left arm 11/15/16   Funches, Adriana Mccallum, MD  famotidine (PEPCID) 20 MG tablet Take 1 tablet (20 mg total) by mouth 2 (two) times daily. 11/15/16   Funches, Adriana Mccallum, MD  fish oil-omega-3 fatty acids 1000 MG capsule Take 1 g by mouth 2 (two) times daily.     [provider]  meclizine (ANTIVERT) 25 MG tablet Take 1 tablet (25 mg total) by mouth 3 (three) times daily as needed for dizziness. 04/01/17   Fatima Blank, MD  nicotine polacrilex (NICORETTE) 4 MG gum Take 2 each (8 mg total) by mouth as needed for smoking cessation. 11/15/16  Funches, Josalyn, MD  nitroGLYCERIN (NITROSTAT) 0.4 MG SL tablet Place 1 tablet (0.4 mg total) under the tongue every 5 (five) minutes as needed for chest pain. 01/22/16   Funches, Adriana Mccallum, MD  tamsulosin (FLOMAX) 0.4 MG CAPS capsule Take 1 capsule (0.4 mg total) by mouth daily. 11/15/16   Funches, Adriana Mccallum, MD  triamcinolone cream (KENALOG) 0.1 % Apply 1 application topically 2 (two) times daily. Apply to R hand 01/22/16   Boykin Nearing, MD    Family History Family History  Problem Relation Age of Onset  . Stroke Father   . Alzheimer's disease Father   . High  blood pressure Father   . Heart Problems Mother     Social History Social History  Substance Use Topics  . Smoking status: Current Every Day Smoker    Packs/day: 1.00    Types: Cigarettes  . Smokeless tobacco: Never Used     Comment: states new years resolution is to quit smoking  . Alcohol use No     Allergies   Patient has no known allergies.   Review of Systems Review of Systems  Respiratory: Negative for shortness of breath.   Cardiovascular: Negative for chest pain.  Gastrointestinal: Negative for nausea and vomiting.  Neurological: Positive for dizziness. Negative for headaches.  All other systems are reviewed and are negative for acute change except as noted in the HPI    Physical Exam Updated Vital Signs BP 123/71 (BP Location: Right Arm)   Pulse (!) 55   Temp 97.9 F (36.6 C) (Oral)   Resp 20   Ht 5\' 8"  (1.727 m)   Wt 93 kg (205 lb)   SpO2 99%   BMI 31.17 kg/m   Physical Exam  Constitutional: He is oriented to person, place, and time. He appears well-developed and well-nourished. No distress.  HENT:  Head: Normocephalic and atraumatic.  Right Ear: Tympanic membrane is scarred. A middle ear effusion is present.  Left Ear: Tympanic membrane normal.  Nose: Nose normal.  Eyes: Pupils are equal, round, and reactive to light. Conjunctivae and EOM are normal. Right eye exhibits no discharge. Left eye exhibits no discharge. No scleral icterus.  Neck: Normal range of motion. Neck supple.  Cardiovascular: Normal rate and regular rhythm.  Exam reveals no gallop and no friction rub.   No murmur heard. Pulmonary/Chest: Effort normal and breath sounds normal. No stridor. No respiratory distress. He has no rales.  Abdominal: Soft. He exhibits no distension. There is no tenderness.  Musculoskeletal: He exhibits no edema or tenderness.  Neurological: He is alert and oriented to person, place, and time.  Mental Status: Alert and oriented to person, place, and time.  Attention and concentration normal. Speech clear. Recent memory is intact  Cranial Nerves  II Visual Fields: Intact to confrontation. Visual fields intact. III, IV, VI: Pupils equal and reactive to light and near. Full eye movement with lateral nystagmus to right and left V Facial Sensation: Normal. No weakness of masticatory muscles  VII: No facial weakness or asymmetry  VIII Auditory Acuity: Grossly normal  IX/X: The uvula is midline; the palate elevates symmetrically  XI: Normal sternocleidomastoid and trapezius strength  XII: The tongue is midline. No atrophy or fasciculations.   Motor System: Muscle Strength: 5/5 and symmetric in the upper and lower extremities. No pronation or drift.  Muscle Tone: Tone and muscle bulk are normal in the upper and lower extremities.   Reflexes: DTRs: 1+ and symmetrical in all four extremities. Plantar responses are  flexor bilaterally.  Coordination: Intact finger-to-nose, heel-to-shin. No tremor.  Sensation: Intact to light touch, and pinprick.  Gait: Routine gait normal.   HINTS: Nystagmus: Lateral nystagmus with left and right gaze Head impulse. Abnormal with left and right head impulse Skew: Normal   Skin: Skin is warm and dry. No rash noted. He is not diaphoretic. No erythema.  Psychiatric: He has a normal mood and affect.  Vitals reviewed.    ED Treatments / Results  Labs (all labs ordered are listed, but only abnormal results are displayed) Labs Reviewed  I-STAT CHEM 8, ED - Abnormal; Notable for the following:       Result Value   Glucose, Bld 115 (*)    All other components within normal limits    EKG  EKG Interpretation None       Radiology No results found.  Procedures Procedures (including critical care time)  Medications Ordered in ED Medications  meclizine (ANTIVERT) tablet 25 mg (not administered)     Initial Impression / Assessment and Plan / ED Course  I have reviewed the triage vital signs and the  nursing notes.  Pertinent labs & imaging results that were available during my care of the patient were reviewed by me and considered in my medical decision making (see chart for details).     No focal deficits. Hints exam reassuring for peripheral process; especially given the recent URI symptoms and right effusion. No suspicion for CVA.   Screening labs obtained revealed no evidence of electrolyte derangements. Blood sugar within normal limits.  The patient is safe for discharge with strict return precautions.   Final Clinical Impressions(s) / ED Diagnoses   Final diagnoses:  Vertigo   Disposition: Discharge  Condition: Good  I have discussed the results, Dx and Tx plan with the patient who expressed understanding and agree(s) with the plan. Discharge instructions discussed at great length. The patient was given strict return precautions who verbalized understanding of the instructions. No further questions at time of discharge.    New Prescriptions   MECLIZINE (ANTIVERT) 25 MG TABLET    Take 1 tablet (25 mg total) by mouth 3 (three) times daily as needed for dizziness.    Follow Up: Boykin Nearing, MD [place] [phone]  Schedule an appointment as soon as possible for a visit in 2 weeks If symptoms do not improve or  worsen      Markita Stcharles, Grayce Sessions, MD 04/01/17 [phone]

## 2017-04-02 MED FILL — CLOPIDOGREL 75 MG TABLET: 75 | 90 days supply | Qty: 90 | Fill #1

## 2017-04-10 ENCOUNTER — Encounter: Payer: Self-pay | Admitting: Family Medicine

## 2017-04-10 ENCOUNTER — Ambulatory Visit: Payer: Medicare Other | Attending: Family Medicine | Admitting: Family Medicine

## 2017-04-10 VITALS — BP 109/60 | HR 54 | Temp 97.6°F | Ht 64.0 in | Wt 201.4 lb

## 2017-04-10 DIAGNOSIS — L301 Dyshidrosis [pompholyx]: Secondary | ICD-10-CM | POA: Insufficient documentation

## 2017-04-10 DIAGNOSIS — R42 Dizziness and giddiness: Secondary | ICD-10-CM | POA: Diagnosis not present

## 2017-04-10 DIAGNOSIS — E669 Obesity, unspecified: Secondary | ICD-10-CM | POA: Diagnosis not present

## 2017-04-10 DIAGNOSIS — M25562 Pain in left knee: Secondary | ICD-10-CM

## 2017-04-10 DIAGNOSIS — I10 Essential (primary) hypertension: Secondary | ICD-10-CM | POA: Diagnosis not present

## 2017-04-10 DIAGNOSIS — E785 Hyperlipidemia, unspecified: Secondary | ICD-10-CM | POA: Insufficient documentation

## 2017-04-10 DIAGNOSIS — Z7982 Long term (current) use of aspirin: Secondary | ICD-10-CM | POA: Insufficient documentation

## 2017-04-10 DIAGNOSIS — F17210 Nicotine dependence, cigarettes, uncomplicated: Secondary | ICD-10-CM | POA: Diagnosis not present

## 2017-04-10 DIAGNOSIS — R21 Rash and other nonspecific skin eruption: Secondary | ICD-10-CM | POA: Insufficient documentation

## 2017-04-10 DIAGNOSIS — Z79899 Other long term (current) drug therapy: Secondary | ICD-10-CM | POA: Insufficient documentation

## 2017-04-10 DIAGNOSIS — I2510 Atherosclerotic heart disease of native coronary artery without angina pectoris: Secondary | ICD-10-CM | POA: Insufficient documentation

## 2017-04-10 MED ORDER — TRIAMCINOLONE ACETONIDE 0.5 % EX OINT: 1.0000 "application " | TOPICAL_OINTMENT | Freq: Two times a day (BID) | CUTANEOUS | 0 refills | Status: DC

## 2017-04-10 MED ORDER — TRIAMCINOLONE ACETONIDE 0.1 % EX CREA: 1.0000 "application " | TOPICAL_CREAM | Freq: Two times a day (BID) | CUTANEOUS | 2 refills | Status: DC

## 2017-04-10 MED FILL — TRIAMCINOLONE 0.5% OINTMENT: 0.5 | 15 days supply | Qty: 30 | Fill #0

## 2017-04-10 NOTE — Patient Instructions (Addendum)
Samuel Lawson was seen today for dizziness.  Diagnoses and all orders for this visit:  Left medial knee pain  Dyshidrotic hand dermatitis -     triamcinolone ointment (KENALOG) 0.5 %; Apply 1 application topically 2 (two) times daily.  Other orders -     triamcinolone cream (KENALOG) 0.1 %; Apply 1 application topically 2 (two) times daily. Apply to R hand   F/u in 8  Weeks for HTN, flu shot and  meet new PCP   Dr. Adrian Blackwater

## 2017-04-10 NOTE — Assessment & Plan Note (Signed)
Recurrent vertigo continue prn meclizine

## 2017-04-10 NOTE — Assessment & Plan Note (Signed)
Improving Patient declines x-ray and PT

## 2017-04-10 NOTE — Progress Notes (Signed)
Subjective:  Patient ID: Samuel Lawson, male    DOB: Feb 27, 1946  Age: 71 y.o. MRN: [id number]  CC: Dizziness   HPI Samuel Lawson has CAD, HTN, HLD, obesity he  presents for    1. ED f/u dizziness: he was seen at [facility] ED on 04/01/17 for dizziness described as room spinning. He admitted to recent URI. He was prescribed meclizine.  Today he reports the dizziness has improved.   2. Left knee pain: persist following twisting injury. He reports walking with a limp. He has medial knee pain especially at night. Pain is overall better.   3. Rash: on hands. No improvement with 0.1% kenalog. Request additional treatment.   Social History  Substance Use Topics  . Smoking status: Current Every Day Smoker    Packs/day: 1.00    Types: Cigarettes  . Smokeless tobacco: Never Used     Comment: states new years resolution is to quit smoking  . Alcohol use No    Outpatient Medications Prior to Visit  Medication Sig Dispense Refill  . aspirin 325 MG EC tablet Take 1 tablet (325 mg total) by mouth daily. 90 tablet 3  . atorvastatin (LIPITOR) 40 MG tablet Take 1 tablet (40 mg total) by mouth at bedtime. 90 tablet 3  . carvedilol (COREG) 3.125 MG tablet Take 1 tablet (3.125 mg total) by mouth 2 (two) times daily with a meal. 180 tablet 3  . clopidogrel (PLAVIX) 75 MG tablet Take 1 tablet (75 mg total) by mouth daily. 90 tablet 3  . famotidine (PEPCID) 20 MG tablet Take 1 tablet (20 mg total) by mouth 2 (two) times daily. 180 tablet 3  . fish oil-omega-3 fatty acids 1000 MG capsule Take 1 g by mouth 2 (two) times daily.     . meclizine (ANTIVERT) 25 MG tablet Take 1 tablet (25 mg total) by mouth 3 (three) times daily as needed for dizziness. 30 tablet 0  . nitroGLYCERIN (NITROSTAT) 0.4 MG SL tablet Place 1 tablet (0.4 mg total) under the tongue every 5 (five) minutes as needed for chest pain. 20 tablet 3  . tamsulosin (FLOMAX) 0.4 MG CAPS capsule Take 1 capsule (0.4 mg total) by mouth daily. 90  capsule 3  . triamcinolone cream (KENALOG) 0.1 % Apply 1 application topically 2 (two) times daily. Apply to R hand 80 g 2  . diclofenac sodium (VOLTAREN) 1 % GEL Apply 4 g topically 4 (four) times daily. To left upper back and under left arm (Patient not taking: Reported on 04/10/2017) 100 g 2  . nicotine polacrilex (NICORETTE) 4 MG gum Take 2 each (8 mg total) by mouth as needed for smoking cessation. (Patient not taking: Reported on 04/10/2017) 100 each 2   No facility-administered medications prior to visit.     ROS Review of Systems  Constitutional: Negative for chills, fatigue, fever and unexpected weight change.  Eyes: Negative for visual disturbance.  Respiratory: Negative for cough and shortness of breath.   Cardiovascular: Negative for chest pain, palpitations and leg swelling.  Gastrointestinal: Negative for abdominal pain, blood in stool, constipation, diarrhea, nausea and vomiting.  Endocrine: Negative for polydipsia, polyphagia and polyuria.  Musculoskeletal: Positive for arthralgias and gait problem. Negative for back pain, myalgias and neck pain.  Skin: Positive for rash.  Allergic/Immunologic: Negative for immunocompromised state.  Neurological: Positive for dizziness.  Hematological: Negative for adenopathy. Does not bruise/bleed easily.  Psychiatric/Behavioral: Negative for dysphoric mood, sleep disturbance and suicidal ideas. The patient is not nervous/anxious.  Objective:  BP 109/60   Pulse (!) 54   Temp 97.6 F (36.4 C) (Oral)   Ht 5\' 4"  (1.626 m)   Wt 201 lb 6.4 oz (91.4 kg)   SpO2 99%   BMI 34.57 kg/m   BP/Weight 04/10/2017 04/01/2017 3/88/8280  Systolic BP 034 917 915  Diastolic BP 60 78 60  Wt. (Lbs) 201.4 205 208.6  BMI 34.57 31.17 35.81    Physical Exam  Constitutional: He appears well-developed and well-nourished. No distress.  obese  HENT:  Head: Normocephalic and atraumatic.  Neck: Normal range of motion. Neck supple.  Cardiovascular: Normal  rate, regular rhythm, normal heart sounds and intact distal pulses.   Pulmonary/Chest: Effort normal and breath sounds normal.  Musculoskeletal: He exhibits no edema.       Legs: Neurological: He is alert.  Skin: Skin is warm and dry. Rash noted. No erythema.     Psychiatric: He has a normal mood and affect.     Assessment & Plan:  Edgard was seen today for dizziness.  Diagnoses and all orders for this visit:  Left medial knee pain  Dyshidrotic hand dermatitis -     triamcinolone ointment (KENALOG) 0.5 %; Apply 1 application topically 2 (two) times daily.  Other orders -     triamcinolone cream (KENALOG) 0.1 %; Apply 1 application topically 2 (two) times daily. Apply to R hand   There are no diagnoses linked to this encounter.  No orders of the defined types were placed in this encounter.   Follow-up: Return in about 8 weeks (around 06/05/2017) for HTN and flu shot .   Boykin Nearing MD

## 2017-04-10 NOTE — Assessment & Plan Note (Signed)
Continue topical steroid Increase to 0.5% triamcinolone ointment

## 2017-04-12 ENCOUNTER — Emergency Department (HOSPITAL_COMMUNITY)
Admission: EM | Admit: 2017-04-12 | Discharge: 2017-04-12 | Disposition: A | Discharge status: Home / Self Care | Payer: Medicare Other | Attending: Emergency Medicine | Admitting: Emergency Medicine

## 2017-04-12 ENCOUNTER — Emergency Department (HOSPITAL_COMMUNITY): Payer: Medicare Other

## 2017-04-12 DIAGNOSIS — E785 Hyperlipidemia, unspecified: Secondary | ICD-10-CM | POA: Insufficient documentation

## 2017-04-12 DIAGNOSIS — H8110 Benign paroxysmal vertigo, unspecified ear: Secondary | ICD-10-CM | POA: Diagnosis not present

## 2017-04-12 DIAGNOSIS — Z79899 Other long term (current) drug therapy: Secondary | ICD-10-CM | POA: Insufficient documentation

## 2017-04-12 DIAGNOSIS — F17210 Nicotine dependence, cigarettes, uncomplicated: Secondary | ICD-10-CM | POA: Diagnosis not present

## 2017-04-12 DIAGNOSIS — R42 Dizziness and giddiness: Secondary | ICD-10-CM | POA: Diagnosis not present

## 2017-04-12 DIAGNOSIS — I10 Essential (primary) hypertension: Secondary | ICD-10-CM | POA: Insufficient documentation

## 2017-04-12 DIAGNOSIS — E119 Type 2 diabetes mellitus without complications: Secondary | ICD-10-CM | POA: Diagnosis not present

## 2017-04-12 DIAGNOSIS — R404 Transient alteration of awareness: Secondary | ICD-10-CM | POA: Diagnosis not present

## 2017-04-12 LAB — RAPID URINE DRUG SCREEN, HOSP PERFORMED
Amphetamines: NOT DETECTED
BARBITURATES: NOT DETECTED
BENZODIAZEPINES: POSITIVE — AB
COCAINE: NOT DETECTED
Opiates: NOT DETECTED
TETRAHYDROCANNABINOL: NOT DETECTED

## 2017-04-12 LAB — CBC WITH DIFFERENTIAL/PLATELET
BASOS ABS: 0 10*3/uL (ref 0.0–0.1)
Basophils Relative: 0 %
EOS PCT: 3 %
Eosinophils Absolute: 0.2 10*3/uL (ref 0.0–0.7)
HEMATOCRIT: 45.1 % (ref 39.0–52.0)
Hemoglobin: 14.7 g/dL (ref 13.0–17.0)
Lymphocytes Relative: 19 %
Lymphs Abs: 1.4 10*3/uL (ref 0.7–4.0)
MCH: 30.4 pg (ref 26.0–34.0)
MCHC: 32.6 g/dL (ref 30.0–36.0)
MCV: 93.2 fL (ref 78.0–100.0)
Monocytes Absolute: 0.6 10*3/uL (ref 0.1–1.0)
Monocytes Relative: 8 %
NEUTROS PCT: 70 %
Neutro Abs: 5 10*3/uL (ref 1.7–7.7)
PLATELETS: 139 10*3/uL — AB (ref 150–400)
RBC: 4.84 MIL/uL (ref 4.22–5.81)
RDW: 13.5 % (ref 11.5–15.5)
WBC: 7.2 10*3/uL (ref 4.0–10.5)

## 2017-04-12 LAB — COMPREHENSIVE METABOLIC PANEL
ALT: 11 U/L — ABNORMAL LOW (ref 17–63)
ANION GAP: 9 (ref 5–15)
AST: 18 U/L (ref 15–41)
Albumin: 3.6 g/dL (ref 3.5–5.0)
Alkaline Phosphatase: 63 U/L (ref 38–126)
BILIRUBIN TOTAL: 1.1 mg/dL (ref 0.3–1.2)
BUN: 9 mg/dL (ref 6–20)
CHLORIDE: 109 mmol/L (ref 101–111)
CO2: 25 mmol/L (ref 22–32)
Calcium: 9.1 mg/dL (ref 8.9–10.3)
Creatinine, Ser: 0.93 mg/dL (ref 0.61–1.24)
Glucose, Bld: 123 mg/dL — ABNORMAL HIGH (ref 65–99)
POTASSIUM: 3.7 mmol/L (ref 3.5–5.1)
Sodium: 143 mmol/L (ref 135–145)
TOTAL PROTEIN: 6.4 g/dL — AB (ref 6.5–8.1)

## 2017-04-12 LAB — I-STAT CHEM 8, ED
BUN: 10 mg/dL (ref 6–20)
CALCIUM ION: 1.16 mmol/L (ref 1.15–1.40)
Chloride: 106 mmol/L (ref 101–111)
Creatinine, Ser: 0.9 mg/dL (ref 0.61–1.24)
Glucose, Bld: 124 mg/dL — ABNORMAL HIGH (ref 65–99)
HCT: 44 % (ref 39.0–52.0)
Hemoglobin: 15 g/dL (ref 13.0–17.0)
Potassium: 3.7 mmol/L (ref 3.5–5.1)
SODIUM: 142 mmol/L (ref 135–145)
TCO2: 27 mmol/L (ref 0–100)

## 2017-04-12 LAB — URINALYSIS, ROUTINE W REFLEX MICROSCOPIC
BILIRUBIN URINE: NEGATIVE
GLUCOSE, UA: NEGATIVE mg/dL
HGB URINE DIPSTICK: NEGATIVE
KETONES UR: NEGATIVE mg/dL
LEUKOCYTES UA: NEGATIVE
Nitrite: NEGATIVE
PH: 7 (ref 5.0–8.0)
Protein, ur: NEGATIVE mg/dL
Specific Gravity, Urine: 1.012 (ref 1.005–1.030)

## 2017-04-12 LAB — PROTIME-INR
INR: 0.95
PROTHROMBIN TIME: 12.7 s (ref 11.4–15.2)

## 2017-04-12 LAB — APTT: aPTT: 30 seconds (ref 24–36)

## 2017-04-12 LAB — ETHANOL: Alcohol, Ethyl (B): <5 mg/dL (ref <5)

## 2017-04-12 LAB — I-STAT TROPONIN, ED: TROPONIN I, POC: 0 ng/mL (ref 0.00–0.08)

## 2017-04-12 MED ORDER — GADOBENATE DIMEGLUMINE 529 MG/ML IV SOLN
20.0000 mL | Freq: Once | INTRAVENOUS | Status: AC
  Administered 2017-04-12: 20 mL via INTRAVENOUS

## 2017-04-12 MED ORDER — IOPAMIDOL (ISOVUE-370) INJECTION 76%
INTRAVENOUS | Status: AC
  Filled 2017-04-12: qty 50

## 2017-04-12 MED ORDER — MECLIZINE HCL 25 MG PO TABS
25.0000 mg | ORAL_TABLET | Freq: Once | ORAL | Status: AC
  Administered 2017-04-12: 25 mg via ORAL
  Filled 2017-04-12: qty 1

## 2017-04-12 MED ORDER — PROMETHAZINE HCL 25 MG/ML IJ SOLN
12.5000 mg | Freq: Once | INTRAMUSCULAR | Status: AC
  Administered 2017-04-12: 12.5 mg via INTRAVENOUS
  Filled 2017-04-12: qty 1

## 2017-04-12 MED ORDER — SODIUM CHLORIDE 0.9 % IV BOLUS (SEPSIS)
1000.0000 mL | Freq: Once | INTRAVENOUS | Status: AC
  Administered 2017-04-12: 1000 mL via INTRAVENOUS

## 2017-04-12 MED ORDER — DIAZEPAM 5 MG/ML IJ SOLN
5.0000 mg | Freq: Once | INTRAMUSCULAR | Status: AC
  Administered 2017-04-12: 5 mg via INTRAVENOUS
  Filled 2017-04-12: qty 2

## 2017-04-12 MED ORDER — DIAZEPAM 2 MG PO TABS: 2.0000 mg | ORAL_TABLET | Freq: Four times a day (QID) | ORAL | 0 refills | Status: DC | PRN

## 2017-04-12 NOTE — ED Notes (Signed)
Given sandal by MRI

## 2017-04-12 NOTE — ED Notes (Signed)
Pt ambulated with a steady gait w/o assistance.

## 2017-04-12 NOTE — ED Notes (Signed)
Pt in MRI at this time 

## 2017-04-12 NOTE — ED Provider Notes (Signed)
[facility] DEPT Provider Note   CSN: [id number] Arrival date & time: 04/12/17  1314     History   Chief Complaint Chief Complaint  Patient presents with  . Emesis    HPI Samuel Lawson is a 71 y.o. male who presents for vertigo. He has a hx of vertigo and was seen on the 24th of July with the same. He was deemed to have peripheral vertigo. Patient was doing will with daily meclizine. Today the patient was working at his computer when he had sudden onset of room spinning, Nausea, vomiting and ataxia. He had several episodes of vomiting and he denies any headache, UL weakness, or other new neurologic sxs. He has known CAD and is a heavy smoker  HPI  Past Medical History:  Diagnosis Date  . 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 Collagen vascular disease ([place])   . Diabetes mellitus without complication ([place])   . Hyperlipidemia   . Hypertension     Patient Active Problem List   Diagnosis Date Noted  . Left medial knee pain 01/22/2017  . Left arm pain 11/19/2016  . 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 BPH (benign prostatic hyperplasia) 07/19/2015  . Nicotine dependence 06/22/2015  . Orthostatic hypotension 12/14/2014  . Vertigo 11/29/2014  . Gastroesophageal reflux disease without esophagitis 03/09/2014  . Essential hypertension 03/09/2014  . Coronary artery disease involving native coronary artery of native heart without angina pectoris 08/25/2013  . Hyperlipidemia LDL goal <100 08/25/2013  . Obesity (BMI 30-39.9) 08/25/2013  . Disequilibrium 08/25/2013    Past Surgical History:  Procedure Laterality Date  . ANGIOPLASTY    . CARDIAC CATHETERIZATION         Home Medications    Prior to Admission medications   Medication Sig Start Date End Date Taking? Authorizing Provider  aspirin 325 MG EC tablet Take 1 tablet (325 mg total) by mouth daily. 11/15/16   Funches, Adriana Mccallum, MD  atorvastatin (LIPITOR) 40 MG tablet Take 1 tablet (40 mg total) by mouth at  bedtime. 11/15/16   Boykin Nearing, MD  carvedilol (COREG) 3.125 MG tablet Take 1 tablet (3.125 mg total) by mouth 2 (two) times daily with a meal. 11/15/16   Funches, Josalyn, MD  clopidogrel (PLAVIX) 75 MG tablet Take 1 tablet (75 mg total) by mouth daily. 11/15/16   Funches, Adriana Mccallum, MD  famotidine (PEPCID) 20 MG tablet Take 1 tablet (20 mg total) by mouth 2 (two) times daily. 11/15/16   Funches, Adriana Mccallum, MD  fish oil-omega-3 fatty acids 1000 MG capsule Take 1 g by mouth 2 (two) times daily.     [provider]  meclizine (ANTIVERT) 25 MG tablet Take 1 tablet (25 mg total) by mouth 3 (three) times daily as needed for dizziness. 04/01/17   Cardama, Grayce Sessions, MD  nitroGLYCERIN (NITROSTAT) 0.4 MG SL tablet Place 1 tablet (0.4 mg total) under the tongue every 5 (five) minutes as needed for chest pain. 01/22/16   Funches, Adriana Mccallum, MD  tamsulosin (FLOMAX) 0.4 MG CAPS capsule Take 1 capsule (0.4 mg total) by mouth daily. 11/15/16   Funches, Adriana Mccallum, MD  triamcinolone cream (KENALOG) 0.1 % Apply 1 application topically 2 (two) times daily. Apply to R hand 04/10/17   Boykin Nearing, MD  triamcinolone ointment (KENALOG) 0.5 % Apply 1 application topically 2 (two) times daily. 04/10/17   Boykin Nearing, MD    Family History Family History  Problem Relation Age of Onset  . Stroke Father   . Alzheimer's  disease Father   . High blood pressure Father   . Heart Problems Mother     Social History Social History  Substance Use Topics  . Smoking status: Current Every Day Smoker    Packs/day: 1.00    Types: Cigarettes  . Smokeless tobacco: Never Used     Comment: states new years resolution is to quit smoking  . Alcohol use No     Allergies   Patient has no known allergies.   Review of Systems Review of Systems  Ten systems reviewed and are negative for acute change, except as noted in the HPI.   Physical Exam Updated Vital Signs BP 129/72 (BP Location: Right Arm)   Pulse (!) 57    Temp (!) 97.4 F (36.3 C) (Oral)   Resp 20   Ht 5\' 4"  (1.626 m)   Wt 91.2 kg (201 lb)   SpO2 98%   BMI 34.50 kg/m   Physical Exam  Constitutional: He is oriented to person, place, and time. He appears well-developed and well-nourished. No distress.  Patient with poor effort to participate in exam  HENT:  Head: Normocephalic and atraumatic.  Eyes: Conjunctivae are normal. No scleral icterus.  Neck: Normal range of motion. Neck supple.  Cardiovascular: Normal rate, regular rhythm and normal heart sounds.   Pulmonary/Chest: Effort normal and breath sounds normal. No respiratory distress.  Abdominal: Soft. There is no tenderness.  Musculoskeletal: He exhibits no edema.  Neurological: He is alert and oriented to person, place, and time.  BL lateral nystagmus No weakness CN intact Sensory intact.  Skin: Skin is warm. He is diaphoretic.  Psychiatric: His behavior is normal.  Nursing note and vitals reviewed.    ED Treatments / Results  Labs (all labs ordered are listed, but only abnormal results are displayed) Labs Reviewed - No data to display  EKG  EKG Interpretation None       Radiology No results found.  Procedures Procedures (including critical care time)  Medications Ordered in ED Medications - No data to display   Initial Impression / Assessment and Plan / ED Course  I have reviewed the triage vital signs and the nursing notes.  Pertinent labs & imaging results that were available during my care of the patient were reviewed by me and considered in my medical decision making (see chart for details).      3:48 PM. Patient taken to MRI. Unable to stay still due to vomiting. I have ordered MRI. Patient will get CT head, reevaluate/ He has severe sxs and will need admission for vomiting and ataxia  Patient with recurrent vertigo and multiple ristk factors for stroke or disection. Seen in shared visit with Dr. Darl Householder who has ordered  MRI brain The patient will  be signed out to oncoming shift for disposition. Patient case discussed with oncoming provider.   Final Clinical Impressions(s) / ED Diagnoses   Final diagnoses:  None    New Prescriptions New Prescriptions   No medications on file     Margarita Mail, PA-C 04/12/17 1518    Margarita Mail, PA-C 04/12/17 1549    Drenda Freeze, MD 04/14/17 [phone]

## 2017-04-12 NOTE — ED Provider Notes (Signed)
6:15 PM Patient signed out to me at shift change. Patient with history of vertigo, here today with worsening symptoms. Patient with persistent vomiting, dizziness, unable to keep anything down. No improvement with Valium. Pending CT head and MRI.  Patient just came back from MRI and states he feels much better. The dizziness has resolved at this time. Waiting on MRI results.  8:27 PM MRI negative. It does show cerumen impaction in the right ear. Patient states his primary care doctor tried to disimpact his ear, however was unsuccessful, and it made his symptoms worse. Will hold off on doing that here and wait until he sees ear nose throat. Patient states he has absolutely no symptoms at this time and feels back to normal. He was able to ambulate up and down the hallway. He wants to go home, however he expressed to me that he is very scared of this coming back. He states when it happens, he doesn't know what to do. I will add to his meclizine a prescription of low-dose Valium. I will have him follow-up with ear nose throat physician. Patient agreed.  Vitals:   04/12/17 1615 04/12/17 1830 04/12/17 1845 04/12/17 1900  BP: 121/70 120/67 (!) 115/97 129/85  Pulse: 60 64 (!) 47 66  Resp: (!) 22     Temp:      TempSrc:      SpO2: 92% 97% 96% 97%  Weight:      Height:          Jeannett Senior, PA-C 04/12/17 2030    Forde Dandy, MD 04/13/17 [phone]

## 2017-04-12 NOTE — ED Triage Notes (Signed)
Pt  Reports  He felt fine when he woke up this AM. Shortly  after he woke up he was became nauseated and diaphoretic.  Pt took his  antivert but  Vomited after.

## 2017-04-12 NOTE — ED Notes (Signed)
Pt reports he is missing his black sandal. This RN helped pt to look around the room, unable to locate. This RN called MRI to help look for sandal.

## 2017-04-12 NOTE — Discharge Instructions (Signed)
Continue to take meclizine as was prescribed earlier. Take Valium at onset of severe symptoms. Follow-up with ear nose throat specialist. Return if worsening.

## 2017-04-12 NOTE — ED Notes (Addendum)
Pt transported to imaging.

## 2017-04-15 MED FILL — diazePAM 2 MG TABS: 2 | 2 days supply | Qty: 10 | Fill #0

## 2017-04-16 ENCOUNTER — Telehealth: Payer: Self-pay | Admitting: Family Medicine

## 2017-04-16 DIAGNOSIS — R42 Dizziness and giddiness: Secondary | ICD-10-CM

## 2017-04-16 NOTE — Telephone Encounter (Signed)
Patient called requesting an ENT referral, ear pain with vertigo. Please f/up pt has seen Dr. Janace Hoard but needs new referral.

## 2017-04-17 NOTE — Telephone Encounter (Signed)
will route to PCP 

## 2017-04-17 NOTE — Telephone Encounter (Signed)
ENT referral placed.

## 2017-05-01 ENCOUNTER — Telehealth: Payer: Self-pay

## 2017-05-01 NOTE — Telephone Encounter (Signed)
Called pt. And was informed that the referral for ENT was placed. Pt. Stated that he would call the specialist and see when he could schedule an appt.

## 2017-05-01 NOTE — Telephone Encounter (Signed)
Pt contacted the office and stated he is waiting for his referral for the ear doctor. I informed pt that the referral was placed on 04-17-17 and we only have 1 referral specialist and she is trying to do her best to get referrals done. Pt states that is "cursed" and he wants to speak with the referral person. I informed pt that she is not in the office today that she is at another office. Pt states this is crazy because he has been trying to call her for 2 weeks. Pt stated "cursed" and hung up  [place] and asked if she would be able to do his referral for ENT. Alinda Sierras stated she will be able to do it. I contacted pt and informed him and he is requesting a sooner appointment than October because he can't walk.  Gaetano Net would you be able to schedule pt an sooner appointment to be seen he was also seen in the ED

## 2017-05-07 MED FILL — TAMSULOSIN HCL 0.4 MG CAP: 0.4 | 90 days supply | Qty: 90 | Fill #1

## 2017-06-10 DIAGNOSIS — H6042 Cholesteatoma of left external ear: Secondary | ICD-10-CM | POA: Diagnosis not present

## 2017-06-10 DIAGNOSIS — H6041 Cholesteatoma of right external ear: Secondary | ICD-10-CM | POA: Insufficient documentation

## 2017-06-10 MED FILL — OFLOXACIN 0.3% EAR DROPS: 0.3 | 12 days supply | Qty: 5 | Fill #0

## 2017-06-10 MED FILL — DEXAMETHASONE 0.1% EYE DROP: 0.1 | 15 days supply | Qty: 5 | Fill #0

## 2017-06-12 ENCOUNTER — Ambulatory Visit: Payer: Medicare Other | Admitting: Internal Medicine

## 2017-06-12 MED FILL — FAMOTIDINE 20 MG TABLET: 20 | 90 days supply | Qty: 180 | Fill #2

## 2017-06-12 MED FILL — ATORVASTATIN 40 MG TABLET: 40 | 90 days supply | Qty: 90 | Fill #2

## 2017-06-12 MED FILL — CARVEDILOL 3.125 MG TABLET: 3.125 | 90 days supply | Qty: 180 | Fill #2

## 2017-06-26 DIAGNOSIS — H903 Sensorineural hearing loss, bilateral: Secondary | ICD-10-CM | POA: Insufficient documentation

## 2017-06-26 DIAGNOSIS — H6042 Cholesteatoma of left external ear: Secondary | ICD-10-CM | POA: Diagnosis not present

## 2017-07-14 MED FILL — CLOPIDOGREL 75 MG TABLET: 75 | 90 days supply | Qty: 90 | Fill #2

## 2017-08-06 MED FILL — TAMSULOSIN HCL 0.4 MG CAP: 0.4 | 90 days supply | Qty: 90 | Fill #2

## 2017-09-15 MED FILL — CARVEDILOL 3.125 MG TABLET: 3.125 | 90 days supply | Qty: 180 | Fill #3

## 2017-09-15 MED FILL — FAMOTIDINE 20 MG TABLET: 20 | 90 days supply | Qty: 180 | Fill #3

## 2017-09-15 MED FILL — ATORVASTATIN 40 MG TABLET: 40 | 90 days supply | Qty: 90 | Fill #3

## 2017-09-16 ENCOUNTER — Emergency Department (HOSPITAL_COMMUNITY)
Admission: EM | Admit: 2017-09-16 | Discharge: 2017-09-17 | Disposition: A | Discharge status: Home / Self Care | Payer: Medicare Other | Attending: Emergency Medicine | Admitting: Emergency Medicine

## 2017-09-16 ENCOUNTER — Encounter (HOSPITAL_COMMUNITY): Payer: Self-pay

## 2017-09-16 ENCOUNTER — Other Ambulatory Visit: Payer: Self-pay

## 2017-09-16 ENCOUNTER — Emergency Department (HOSPITAL_COMMUNITY): Payer: Medicare Other

## 2017-09-16 DIAGNOSIS — I252 Old myocardial infarction: Secondary | ICD-10-CM | POA: Insufficient documentation

## 2017-09-16 DIAGNOSIS — F17210 Nicotine dependence, cigarettes, uncomplicated: Secondary | ICD-10-CM | POA: Diagnosis not present

## 2017-09-16 DIAGNOSIS — R42 Dizziness and giddiness: Secondary | ICD-10-CM

## 2017-09-16 DIAGNOSIS — I10 Essential (primary) hypertension: Secondary | ICD-10-CM | POA: Diagnosis not present

## 2017-09-16 DIAGNOSIS — R112 Nausea with vomiting, unspecified: Secondary | ICD-10-CM | POA: Diagnosis not present

## 2017-09-16 DIAGNOSIS — R404 Transient alteration of awareness: Secondary | ICD-10-CM | POA: Diagnosis not present

## 2017-09-16 DIAGNOSIS — Z7902 Long term (current) use of antithrombotics/antiplatelets: Secondary | ICD-10-CM | POA: Diagnosis not present

## 2017-09-16 DIAGNOSIS — Z7982 Long term (current) use of aspirin: Secondary | ICD-10-CM | POA: Insufficient documentation

## 2017-09-16 DIAGNOSIS — E119 Type 2 diabetes mellitus without complications: Secondary | ICD-10-CM | POA: Insufficient documentation

## 2017-09-16 DIAGNOSIS — I2510 Atherosclerotic heart disease of native coronary artery without angina pectoris: Secondary | ICD-10-CM | POA: Insufficient documentation

## 2017-09-16 LAB — I-STAT TROPONIN, ED: Troponin i, poc: 0 ng/mL (ref 0.00–0.08)

## 2017-09-16 LAB — CBC
HCT: 42.3 % (ref 39.0–52.0)
Hemoglobin: 13.5 g/dL (ref 13.0–17.0)
MCH: 30.3 pg (ref 26.0–34.0)
MCHC: 31.9 g/dL (ref 30.0–36.0)
MCV: 94.8 fL (ref 78.0–100.0)
PLATELETS: 122 10*3/uL — AB (ref 150–400)
RBC: 4.46 MIL/uL (ref 4.22–5.81)
RDW: 13.2 % (ref 11.5–15.5)
WBC: 8.8 10*3/uL (ref 4.0–10.5)

## 2017-09-16 LAB — BASIC METABOLIC PANEL
Anion gap: 7 (ref 5–15)
BUN: 13 mg/dL (ref 6–20)
CHLORIDE: 105 mmol/L (ref 101–111)
CO2: 26 mmol/L (ref 22–32)
Calcium: 8.7 mg/dL — ABNORMAL LOW (ref 8.9–10.3)
Creatinine, Ser: 0.93 mg/dL (ref 0.61–1.24)
GFR calc non Af Amer: >60 mL/min (ref >60)
Glucose, Bld: 109 mg/dL — ABNORMAL HIGH (ref 65–99)
POTASSIUM: 3.2 mmol/L — AB (ref 3.5–5.1)
SODIUM: 138 mmol/L (ref 135–145)

## 2017-09-16 MED ORDER — MECLIZINE HCL 25 MG PO TABS
25.0000 mg | ORAL_TABLET | Freq: Once | ORAL | Status: AC
  Administered 2017-09-17: 25 mg via ORAL
  Filled 2017-09-16: qty 1

## 2017-09-16 MED ORDER — SODIUM CHLORIDE 0.9 % IV BOLUS (SEPSIS)
500.0000 mL | Freq: Once | INTRAVENOUS | Status: AC
  Administered 2017-09-16: 500 mL via INTRAVENOUS

## 2017-09-16 NOTE — ED Provider Notes (Signed)
[facility] EMERGENCY DEPARTMENT Provider Note   CSN: [id number] Arrival date & time: 09/16/17  2219     History   Chief Complaint Chief Complaint  Patient presents with  . Dizziness    HPI Samuel Lawson is a 72 y.o. male.  The history is provided by the patient.  Dizziness  Quality:  Head spinning Severity:  Severe Onset quality:  Sudden Timing:  Constant Progression:  Unchanged Chronicity:  Recurrent Context: head movement   Relieved by:  Nothing Worsened by:  Nothing Ineffective treatments: meclizine but immediately vomited it up. Associated symptoms: nausea and vomiting   Associated symptoms: no chest pain, no headaches, no palpitations, no shortness of breath, no vision changes and no weakness   Risk factors: multiple medications     Past Medical History:  Diagnosis Date  . Acute MI ([place]) 2009   STENTS PLACED  . Collagen vascular disease ([place])   . Diabetes mellitus without complication ([employer])   . Hyperlipidemia   . Hypertension     Patient Active Problem List   Diagnosis Date Noted  . Left medial knee pain 01/22/2017  . Left arm pain 11/19/2016  . 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 BPH (benign prostatic hyperplasia) 07/19/2015  . Nicotine dependence 06/22/2015  . Orthostatic hypotension 12/14/2014  . Vertigo 11/29/2014  . Gastroesophageal reflux disease without esophagitis 03/09/2014  . Essential hypertension 03/09/2014  . Coronary artery disease involving native coronary artery of native heart without angina pectoris 08/25/2013  . Hyperlipidemia LDL goal <100 08/25/2013  . Obesity (BMI 30-39.9) 08/25/2013  . Disequilibrium 08/25/2013    Past Surgical History:  Procedure Laterality Date  . ANGIOPLASTY    . CARDIAC CATHETERIZATION         Home Medications    Prior to Admission medications   Medication Sig Start Date End Date Taking? Authorizing Provider  aspirin 325 MG EC tablet Take 1 tablet (325 mg total) by  mouth daily. 11/15/16   Funches, Adriana Mccallum, MD  atorvastatin (LIPITOR) 40 MG tablet Take 1 tablet (40 mg total) by mouth at bedtime. 11/15/16   Boykin Nearing, MD  carvedilol (COREG) 3.125 MG tablet Take 1 tablet (3.125 mg total) by mouth 2 (two) times daily with a meal. 11/15/16   Funches, Josalyn, MD  clopidogrel (PLAVIX) 75 MG tablet Take 1 tablet (75 mg total) by mouth daily. 11/15/16   Funches, Adriana Mccallum, MD  diazepam (VALIUM) 2 MG tablet Take 1 tablet (2 mg total) by mouth every 6 (six) hours as needed (vertigo symptoms). 04/12/17   Kirichenko, Tatyana, PA-C  famotidine (PEPCID) 20 MG tablet Take 1 tablet (20 mg total) by mouth 2 (two) times daily. 11/15/16   Funches, Adriana Mccallum, MD  fish oil-omega-3 fatty acids 1000 MG capsule Take 1 g by mouth 2 (two) times daily.     [provider]  meclizine (ANTIVERT) 25 MG tablet Take 1 tablet (25 mg total) by mouth 3 (three) times daily as needed for dizziness. 04/01/17   Cardama, Grayce Sessions, MD  nitroGLYCERIN (NITROSTAT) 0.4 MG SL tablet Place 1 tablet (0.4 mg total) under the tongue every 5 (five) minutes as needed for chest pain. 01/22/16   Funches, Adriana Mccallum, MD  tamsulosin (FLOMAX) 0.4 MG CAPS capsule Take 1 capsule (0.4 mg total) by mouth daily. 11/15/16   Funches, Adriana Mccallum, MD  triamcinolone cream (KENALOG) 0.1 % Apply 1 application topically 2 (two) times daily. Apply to R hand 04/10/17   Boykin Nearing, MD  triamcinolone ointment (KENALOG) 0.5 %  Apply 1 application topically 2 (two) times daily. 04/10/17   Boykin Nearing, MD    Family History Family History  Problem Relation Age of Onset  . Stroke Father   . Alzheimer's disease Father   . High blood pressure Father   . Heart Problems Mother     Social History Social History   Tobacco Use  . Smoking status: Current Every Day Smoker    Packs/day: 1.00    Types: Cigarettes  . Smokeless tobacco: Never Used  . Tobacco comment: states new years resolution is to quit smoking  Substance Use Topics    . Alcohol use: No  . Drug use: No     Allergies   Patient has no known allergies.   Review of Systems Review of Systems  Constitutional: Negative for fever.  Eyes: Negative for visual disturbance.  Respiratory: Negative for shortness of breath.   Cardiovascular: Negative for chest pain, palpitations and leg swelling.  Gastrointestinal: Positive for nausea and vomiting.  Neurological: Positive for dizziness. Negative for tremors, seizures, syncope, facial asymmetry, speech difficulty, weakness, light-headedness, numbness and headaches.  All other systems reviewed and are negative.    Physical Exam Updated Vital Signs BP (!) 152/86 (BP Location: Left Arm)   Pulse 62   Temp 97.7 F (36.5 C) (Oral)   Resp 16   Ht 5\' 8"  (1.727 m)   Wt 91.2 kg (201 lb)   SpO2 95%   BMI 30.56 kg/m   Physical Exam  Constitutional: He is oriented to person, place, and time. He appears well-developed and well-nourished. No distress.  HENT:  Head: Normocephalic and atraumatic.  Nose: Nose normal.  Mouth/Throat: No oropharyngeal exudate.  Eyes: Conjunctivae and EOM are normal. Pupils are equal, round, and reactive to light.  Neck: Normal range of motion. Neck supple.  Cardiovascular: Normal rate, regular rhythm, normal heart sounds and intact distal pulses.  Pulmonary/Chest: Effort normal and breath sounds normal. No stridor. He has no wheezes. He has no rales.  Abdominal: Soft. Bowel sounds are normal. He exhibits no mass. There is no tenderness. There is no rebound and no guarding.  Musculoskeletal: Normal range of motion.  Neurological: He is alert and oriented to person, place, and time.  Skin: Skin is warm and dry. Capillary refill takes less than 2 seconds.  Psychiatric: He has a normal mood and affect.     ED Treatments / Results  Labs (all labs ordered are listed, but only abnormal results are displayed) Labs Reviewed  BASIC METABOLIC PANEL  CBC    EKG   EKG  Interpretation  Date/Time:  Tuesday September 16 2017 22:12:25 EST Ventricular Rate:  74 PR Interval:    QRS Duration: 98 QT Interval:  430 QTC Calculation: 477 R Axis:   53 Text Interpretation:  Normal sinus rhythm Premature ventricular complexes Confirmed by Randal Buba, Izumi Mixon ([id number]) on 09/16/2017 11:32:36 PM       Procedures Procedures (including critical care time)  Medications Ordered in ED  Medications  sodium chloride 0.9 % bolus 500 mL (not administered)  meclizine (ANTIVERT) tablet 25 mg (not administered)    Given zofran by EMS PTA   Final Clinical Impressions(s) / ED Diagnoses   Return for worsening pain, fevers > 100.4 unrelieved by medication, shortness of breath, intractable vomiting, or diarrhea, abdominal pain, Inability to tolerate liquids or food, cough, altered mental status or any concerns. No signs of systemic illness or infection. The patient is nontoxic-appearing on exam and vital signs are within normal  limits.    I have reviewed the triage vital signs and the nursing notes. Pertinent labs &imaging results that were available during my care of the patient were reviewed by me and considered in my medical decision making (see chart for details).  After history, exam, and medical workup I feel the patient has been appropriately medically screened and is safe for discharge home. Pertinent diagnoses were discussed with the patient. Patient was given return precautions.      Chamberlain Steinborn, MD 09/17/17 [id number]

## 2017-09-16 NOTE — ED Triage Notes (Signed)
Per EMS, pt has been having bouts of vertigo for several weeks and has been taking meclizine with relief. This evening pt had episode of vertigo and took meclizine but could not keep it down. Pt has been vomiting since. VSS, Axox4. Given zofran in route with no relief. Neuro intact.

## 2017-09-17 ENCOUNTER — Encounter (HOSPITAL_COMMUNITY): Payer: Self-pay | Admitting: Emergency Medicine

## 2017-09-17 DIAGNOSIS — R42 Dizziness and giddiness: Secondary | ICD-10-CM | POA: Diagnosis not present

## 2017-09-17 MED ORDER — MECLIZINE HCL 25 MG PO TABS: 25.0000 mg | ORAL_TABLET | Freq: Three times a day (TID) | ORAL | 0 refills | Status: DC | PRN

## 2017-09-17 NOTE — ED Notes (Signed)
Provided patient with graham crackers and sprite for PO challenge.

## 2017-09-17 NOTE — ED Notes (Signed)
Pt ate and drank with no problem

## 2017-09-18 MED FILL — MECLIZINE 25 MG TABLET: 25 | 5 days supply | Qty: 16 | Fill #0

## 2017-10-09 MED FILL — CLOPIDOGREL 75 MG TABLET: 75 | 90 days supply | Qty: 90 | Fill #3

## 2017-11-07 MED FILL — TAMSULOSIN HCL 0.4 MG CAP: 0.4 | 30 days supply | Qty: 30 | Fill #3

## 2017-11-11 ENCOUNTER — Encounter: Payer: Self-pay | Admitting: Nurse Practitioner

## 2017-11-11 ENCOUNTER — Ambulatory Visit: Payer: Medicare Other | Attending: Nurse Practitioner | Admitting: Nurse Practitioner

## 2017-11-11 VITALS — BP 131/82 | HR 64 | Temp 98.3°F | Ht 67.0 in | Wt 206.0 lb

## 2017-11-11 DIAGNOSIS — K219 Gastro-esophageal reflux disease without esophagitis: Secondary | ICD-10-CM | POA: Insufficient documentation

## 2017-11-11 DIAGNOSIS — Z79899 Other long term (current) drug therapy: Secondary | ICD-10-CM | POA: Insufficient documentation

## 2017-11-11 DIAGNOSIS — Z6839 Body mass index (BMI) 39.0-39.9, adult: Secondary | ICD-10-CM | POA: Insufficient documentation

## 2017-11-11 DIAGNOSIS — R7303 Prediabetes: Secondary | ICD-10-CM | POA: Diagnosis not present

## 2017-11-11 DIAGNOSIS — Z955 Presence of coronary angioplasty implant and graft: Secondary | ICD-10-CM | POA: Insufficient documentation

## 2017-11-11 DIAGNOSIS — I252 Old myocardial infarction: Secondary | ICD-10-CM | POA: Diagnosis not present

## 2017-11-11 DIAGNOSIS — F17210 Nicotine dependence, cigarettes, uncomplicated: Secondary | ICD-10-CM | POA: Insufficient documentation

## 2017-11-11 DIAGNOSIS — E669 Obesity, unspecified: Secondary | ICD-10-CM | POA: Insufficient documentation

## 2017-11-11 DIAGNOSIS — Z760 Encounter for issue of repeat prescription: Secondary | ICD-10-CM | POA: Insufficient documentation

## 2017-11-11 DIAGNOSIS — E785 Hyperlipidemia, unspecified: Secondary | ICD-10-CM | POA: Diagnosis not present

## 2017-11-11 DIAGNOSIS — I2510 Atherosclerotic heart disease of native coronary artery without angina pectoris: Secondary | ICD-10-CM | POA: Diagnosis not present

## 2017-11-11 DIAGNOSIS — Z7902 Long term (current) use of antithrombotics/antiplatelets: Secondary | ICD-10-CM | POA: Diagnosis not present

## 2017-11-11 DIAGNOSIS — I10 Essential (primary) hypertension: Secondary | ICD-10-CM | POA: Diagnosis not present

## 2017-11-11 DIAGNOSIS — Z7982 Long term (current) use of aspirin: Secondary | ICD-10-CM | POA: Insufficient documentation

## 2017-11-11 DIAGNOSIS — F17290 Nicotine dependence, other tobacco product, uncomplicated: Secondary | ICD-10-CM

## 2017-11-11 DIAGNOSIS — N400 Enlarged prostate without lower urinary tract symptoms: Secondary | ICD-10-CM | POA: Diagnosis not present

## 2017-11-11 MED ORDER — TAMSULOSIN HCL 0.4 MG PO CAPS: 0.4000 mg | ORAL_CAPSULE | Freq: Every day | ORAL | 3 refills | Status: DC

## 2017-11-11 MED ORDER — NITROGLYCERIN 0.4 MG SL SUBL: 0.4000 mg | SUBLINGUAL_TABLET | SUBLINGUAL | 3 refills | Status: DC | PRN

## 2017-11-11 MED ORDER — ASPIRIN 325 MG PO TBEC: 325.0000 mg | DELAYED_RELEASE_TABLET | Freq: Every day | ORAL | 3 refills | Status: DC

## 2017-11-11 MED ORDER — CLOPIDOGREL BISULFATE 75 MG PO TABS: 75.0000 mg | ORAL_TABLET | Freq: Every day | ORAL | 3 refills | Status: DC

## 2017-11-11 MED ORDER — RANITIDINE HCL 150 MG PO TABS: 150.0000 mg | ORAL_TABLET | Freq: Two times a day (BID) | ORAL | 2 refills | Status: DC

## 2017-11-11 MED ORDER — NICOTINE POLACRILEX 4 MG MT GUM: 4.0000 mg | CHEWING_GUM | OROMUCOSAL | 1 refills | Status: DC | PRN

## 2017-11-11 MED ORDER — CARVEDILOL 3.125 MG PO TABS: 3.1250 mg | ORAL_TABLET | Freq: Two times a day (BID) | ORAL | 3 refills | Status: DC

## 2017-11-11 MED ORDER — ATORVASTATIN CALCIUM 40 MG PO TABS: 40.0000 mg | ORAL_TABLET | Freq: Every day | ORAL | 3 refills | Status: DC

## 2017-11-11 MED FILL — raNITIdine HCL 150 MG TABS: 150 | 90 days supply | Qty: 180 | Fill #0

## 2017-11-11 MED FILL — NITROSTAT 0.4 MG TABLET SL: 0.4 | 25 days supply | Qty: 25 | Fill #0
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
## 2017-11-11 NOTE — Patient Instructions (Addendum)
Benign Prostatic Hyperplasia  Benign prostatic hyperplasia (BPH) is an enlarged prostate gland that is caused by the normal aging process and not by cancer. The prostate is a walnut-sized gland that is involved in the production of semen. It is located in front of the rectum and below the bladder. The bladder stores urine and the urethra is the tube that carries the urine out of the body. The prostate may get bigger as a man gets older.  An enlarged prostate can press on the urethra. This can make it harder to pass urine. The build-up of urine in the bladder can cause infection. Back pressure and infection may progress to bladder damage and kidney (renal) failure.  What are the causes?  This condition is part of a normal aging process. However, not all men develop problems from this condition. If the prostate enlarges away from the urethra, urine flow will not be blocked. If it enlarges toward the urethra and compresses it, there will be problems passing urine.  What increases the risk?  This condition is more likely to develop in men over the age of 50 years.  What are the signs or symptoms?  Symptoms of this condition include:  · Getting up often during the night to urinate.  · Needing to urinate frequently during the day.  · Difficulty starting urine flow.  · Decrease in size and strength of your urine stream.  · Leaking (dribbling) after urinating.  · Inability to pass urine. This needs immediate treatment.  · Inability to completely empty your bladder.  · Pain when you pass urine. This is more common if there is also an infection.  · Urinary tract infection (UTI).    How is this diagnosed?  This condition is diagnosed based on your medical history, a physical exam, and your symptoms. Tests will also be done, such as:  · A post-void bladder scan. This measures any amount of urine that may remain in your bladder after you finish urinating.  · A digital rectal exam. In a rectal exam, your health care provider  checks your prostate by putting a lubricated, gloved finger into your rectum to feel the back of your prostate gland. This exam detects the size of your gland and any abnormal lumps or growths.  · An exam of your urine (urinalysis).  · A prostate specific antigen (PSA) screening. This is a blood test used to screen for prostate cancer.  · An ultrasound. This test uses sound waves to electronically produce a picture of your prostate gland.    Your health care provider may refer you to a specialist in kidney and prostate diseases (urologist).  How is this treated?  Once symptoms begin, your health care provider will monitor your condition (active surveillance or watchful waiting). Treatment for this condition will depend on the severity of your condition. Treatment may include:  · Observation and yearly exams. This may be the only treatment needed if your condition and symptoms are mild.  · Medicines to relieve your symptoms, including:  ? Medicines to shrink the prostate.  ? Medicines to relax the muscle of the prostate.  · Surgery in severe cases. Surgery may include:  ? Prostatectomy. In this procedure, the prostate tissue is removed completely through an open incision or with a laparascope or robotics.  ? Transurethral resection of the prostate (TURP). In this procedure, a tool is inserted through the opening at the tip of the penis (urethra). It is used to cut away tissue of   the inner core of the prostate. The pieces are removed through the same opening of the penis. This removes the blockage.  ? Transurethral incision (TUIP). In this procedure, small cuts are made in the prostate. This lessens the prostate's pressure on the urethra.  ? Transurethral microwave thermotherapy (TUMT). This procedure uses microwaves to create heat. The heat destroys and removes a small amount of prostate tissue.  ? Transurethral needle ablation (TUNA). This procedure uses radio frequencies to destroy and remove a small amount of  prostate tissue.  ? Interstitial laser coagulation (ILC). This procedure uses a laser to destroy and remove a small amount of prostate tissue.  ? Transurethral electrovaporization (TUVP). This procedure uses electrodes to destroy and remove a small amount of prostate tissue.  ? Prostatic urethral lift. This procedure inserts an implant to push the lobes of the prostate away from the urethra.    Follow these instructions at home:  · Take over-the-counter and prescription medicines only as told by your health care provider.  · Monitor your symptoms for any changes. Contact your health care provider with any changes.  · Avoid drinking large amounts of liquid before going to bed or out in public.  · Avoid or reduce how much caffeine or alcohol you drink.  · Give yourself time when you urinate.  · Keep all follow-up visits as told by your health care provider. This is important.  Contact a health care provider if:  · You have unexplained back pain.  · Your symptoms do not get better with treatment.  · You develop side effects from the medicine you are taking.  · Your urine becomes very dark or has a bad smell.  · Your lower abdomen becomes distended and you have trouble passing your urine.  Get help right away if:  · You have a fever or chills.  · You suddenly cannot urinate.  · You feel lightheaded, or very dizzy, or you faint.  · There are large amounts of blood or clots in the urine.  · Your urinary problems become hard to manage.  · You develop moderate to severe low back or flank pain. The flank is the side of your body between the ribs and the hip.  These symptoms may represent a serious problem that is an emergency. Do not wait to see if the symptoms will go away. Get medical help right away. Call your local emergency services (911 in the [place] Do not drive yourself to the hospital.  Summary  · Benign prostatic hyperplasia (BPH) is an enlarged prostate that is caused by the normal aging process and not by  cancer.  · An enlarged prostate can press on the urethra. This can make it hard to pass urine.  · This condition is part of a normal aging process and is more likely to develop in men over the age of 50 years.  · Get help right away if you suddenly cannot urinate.  This information is not intended to replace advice given to you by your health care provider. Make sure you discuss any questions you have with your health care provider.  Document Released: 08/26/2005 Document Revised: 09/30/2016 Document Reviewed: 09/30/2016  Elsevier Interactive Patient Education © 2018 [employer]

## 2017-11-12 ENCOUNTER — Ambulatory Visit: Payer: Medicare Other | Attending: Nurse Practitioner

## 2017-11-12 DIAGNOSIS — E785 Hyperlipidemia, unspecified: Secondary | ICD-10-CM | POA: Insufficient documentation

## 2017-11-12 DIAGNOSIS — I10 Essential (primary) hypertension: Secondary | ICD-10-CM | POA: Diagnosis not present

## 2017-11-12 DIAGNOSIS — N400 Enlarged prostate without lower urinary tract symptoms: Secondary | ICD-10-CM | POA: Diagnosis not present

## 2017-11-12 DIAGNOSIS — E669 Obesity, unspecified: Secondary | ICD-10-CM | POA: Insufficient documentation

## 2017-11-12 DIAGNOSIS — R7303 Prediabetes: Secondary | ICD-10-CM | POA: Diagnosis not present

## 2017-11-12 NOTE — Progress Notes (Signed)
Patient here for lab visit only 

## 2017-11-13 LAB — LIPID PANEL
CHOLESTEROL TOTAL: 127 mg/dL (ref 100–199)
Chol/HDL Ratio: 2.7 ratio (ref 0.0–5.0)
HDL: 47 mg/dL (ref >39)
LDL Calculated: 51 mg/dL (ref 0–99)
TRIGLYCERIDES: 143 mg/dL (ref 0–149)
VLDL Cholesterol Cal: 29 mg/dL (ref 5–40)

## 2017-11-13 LAB — CBC
Hematocrit: 46.1 % (ref 37.5–51.0)
Hemoglobin: 15.1 g/dL (ref 13.0–17.7)
MCH: 31.3 pg (ref 26.6–33.0)
MCHC: 32.8 g/dL (ref 31.5–35.7)
MCV: 95 fL (ref 79–97)
PLATELETS: 146 10*3/uL — AB (ref 150–379)
RBC: 4.83 x10E6/uL (ref 4.14–5.80)
RDW: 13.6 % (ref 12.3–15.4)
WBC: 6.5 10*3/uL (ref 3.4–10.8)

## 2017-11-13 LAB — CMP14+EGFR
A/G RATIO: 1.8 (ref 1.2–2.2)
ALK PHOS: 68 IU/L (ref 39–117)
ALT: 11 IU/L (ref 0–44)
AST: 13 IU/L (ref 0–40)
Albumin: 4.2 g/dL (ref 3.6–4.8)
BILIRUBIN TOTAL: 0.7 mg/dL (ref 0.0–1.2)
BUN/Creatinine Ratio: 17 (ref 10–24)
BUN: 19 mg/dL (ref 8–27)
CHLORIDE: 103 mmol/L (ref 96–106)
CO2: 26 mmol/L (ref 20–29)
Calcium: 9.5 mg/dL (ref 8.6–10.2)
Creatinine, Ser: 1.13 mg/dL (ref 0.76–1.27)
GFR calc non Af Amer: 66 mL/min/{1.73_m2} (ref >59)
GFR, EST AFRICAN AMERICAN: 76 mL/min/{1.73_m2} (ref >59)
Globulin, Total: 2.3 g/dL (ref 1.5–4.5)
Glucose: 120 mg/dL — ABNORMAL HIGH (ref 65–99)
POTASSIUM: 4.4 mmol/L (ref 3.5–5.2)
Sodium: 143 mmol/L (ref 134–144)
Total Protein: 6.5 g/dL (ref 6.0–8.5)

## 2017-11-13 LAB — HEMOGLOBIN A1C
Est. average glucose Bld gHb Est-mCnc: 114 mg/dL
Hgb A1c MFr Bld: 5.6 % (ref 4.8–5.6)

## 2017-11-13 LAB — PSA: PROSTATE SPECIFIC AG, SERUM: 1 ng/mL (ref 0.0–4.0)

## 2017-11-14 ENCOUNTER — Telehealth: Payer: Self-pay

## 2017-11-14 NOTE — Telephone Encounter (Signed)
CMA called patient to inform on lab results and PCP advising.   Patient understood.  

## 2017-11-14 NOTE — Telephone Encounter (Signed)
-----   Message from Gildardo Pounds, NP sent at 11/13/2017 10:38 AM EST ----- Labs are essentially normal. Make sure you are drinking at least 48 oz of water per day. Work on eating a low fat, heart healthy diet and participate in regular aerobic exercise program to control as well. Exercise at least 150 minutes per week.

## 2017-11-18 ENCOUNTER — Telehealth: Payer: Self-pay

## 2017-11-18 NOTE — Telephone Encounter (Signed)
Pharmacy request if okay to fill patient's Meclizine 25 mg?

## 2017-11-20 ENCOUNTER — Other Ambulatory Visit: Payer: Self-pay | Admitting: Nurse Practitioner

## 2017-11-20 MED ORDER — MECLIZINE HCL 25 MG PO TABS: 25.0000 mg | ORAL_TABLET | Freq: Three times a day (TID) | ORAL | 1 refills | Status: DC | PRN

## 2017-11-20 MED FILL — TRAVEL SICKNESS 25 MG TAB C: 25 | 5 days supply | Qty: 16 | Fill #0

## 2017-11-20 NOTE — Telephone Encounter (Signed)
CMA called patient to inform Rx has been sent.  Patient understood.

## 2017-11-20 NOTE — Telephone Encounter (Signed)
Order placed

## 2017-11-23 NOTE — Telephone Encounter (Signed)
Noted  

## 2017-12-04 MED FILL — TAMSULOSIN HCL 0.4 MG CAP: 0.4 | 90 days supply | Qty: 90 | Fill #0

## 2017-12-05 ENCOUNTER — Encounter: Payer: Self-pay | Admitting: Nurse Practitioner

## 2017-12-05 ENCOUNTER — Ambulatory Visit: Payer: Medicare Other | Attending: Nurse Practitioner | Admitting: Nurse Practitioner

## 2017-12-05 VITALS — BP 112/73 | HR 62 | Temp 98.0°F | Resp 16 | Ht 67.0 in | Wt 205.4 lb

## 2017-12-05 DIAGNOSIS — Z7982 Long term (current) use of aspirin: Secondary | ICD-10-CM | POA: Insufficient documentation

## 2017-12-05 DIAGNOSIS — I252 Old myocardial infarction: Secondary | ICD-10-CM | POA: Diagnosis not present

## 2017-12-05 DIAGNOSIS — H6042 Cholesteatoma of left external ear: Secondary | ICD-10-CM | POA: Insufficient documentation

## 2017-12-05 DIAGNOSIS — E785 Hyperlipidemia, unspecified: Secondary | ICD-10-CM | POA: Insufficient documentation

## 2017-12-05 DIAGNOSIS — I10 Essential (primary) hypertension: Secondary | ICD-10-CM | POA: Insufficient documentation

## 2017-12-05 DIAGNOSIS — Z7902 Long term (current) use of antithrombotics/antiplatelets: Secondary | ICD-10-CM | POA: Insufficient documentation

## 2017-12-05 DIAGNOSIS — I2510 Atherosclerotic heart disease of native coronary artery without angina pectoris: Secondary | ICD-10-CM | POA: Insufficient documentation

## 2017-12-05 DIAGNOSIS — H905 Unspecified sensorineural hearing loss: Secondary | ICD-10-CM | POA: Diagnosis not present

## 2017-12-05 DIAGNOSIS — Z955 Presence of coronary angioplasty implant and graft: Secondary | ICD-10-CM | POA: Diagnosis not present

## 2017-12-05 DIAGNOSIS — Z79899 Other long term (current) drug therapy: Secondary | ICD-10-CM | POA: Insufficient documentation

## 2017-12-05 DIAGNOSIS — H9191 Unspecified hearing loss, right ear: Secondary | ICD-10-CM | POA: Diagnosis not present

## 2017-12-05 DIAGNOSIS — E119 Type 2 diabetes mellitus without complications: Secondary | ICD-10-CM | POA: Diagnosis not present

## 2017-12-05 DIAGNOSIS — R42 Dizziness and giddiness: Secondary | ICD-10-CM | POA: Insufficient documentation

## 2017-12-05 MED ORDER — MECLIZINE HCL 25 MG PO TABS
25.0000 mg | ORAL_TABLET | Freq: Three times a day (TID) | ORAL | 1 refills | Status: DC | PRN
Start: 2017-12-05 — End: 2018-05-20

## 2017-12-05 MED FILL — MECLIZINE 25 MG TABLET: 25 | 5 days supply | Qty: 16 | Fill #0

## 2017-12-05 NOTE — Patient Instructions (Signed)

## 2017-12-05 NOTE — Progress Notes (Signed)
Assessment & Plan:  Samuel Lawson was seen today for follow-up.  Diagnoses and all orders for this visit:   Disequilibrium -     meclizine (ANTIVERT) 25 MG tablet; Take 1 tablet (25 mg total) by mouth 3 (three) times daily as needed for dizziness. STOP SMOKING   Coronary artery disease involving native coronary artery of native heart without angina pectoris Continue medications as prescribed.  Refill plavix. Needs CBC in a few weeks. Platelets slightly abnormal (low).   Essential hypertension Continue all antihypertensives as prescribed.  Remember to bring in your blood pressure log with you for your follow up appointment.  DASH/Mediterranean Diets are healthier choices for HTN.      Patient has been counseled on age-appropriate routine health concerns for screening and prevention. These are reviewed and up-to-date. Referrals have been placed accordingly. Immunizations are up-to-date or declined.    Subjective:   Chief Complaint  Patient presents with  . Follow-up   HPI Samuel Lawson 72 y.o. male presents to office today for follow up.   Dizziness He has a chronic history of disequilibrium. 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 It was recommended that he follow up with obtaining hearing aids which he hast not. Today he continues to endorse intermittent dizziness. He does continue to smoke. Relieving factors are meclizine. He denies any falls.  The patient describes the symptoms as disequalibirum. Symptoms are exacerbated by rapid head movements The patient also complains of hearing loss of the right ear which is chronic. Patient denies aural pressure. Otalgia, otorrhea.  He has been treated with meclizine (Antivert) with good improvement.  Previous work up has been CT/MRI; ENT referral.   Essential Hypertension Blood pressure is well  controlled. He endorses medication compliance. Denies chest pain, shortness of breath, palpitations, lightheadedness, dizziness, headaches or BLE edema. He continues to smoke.  BP Readings from Last 3 Encounters:  12/05/17 112/73  11/11/17 131/82  09/17/17 (!) 104/59   Review of Systems  Constitutional: Negative for fever, malaise/fatigue and weight loss.  HENT: Negative.  Negative for nosebleeds.   Eyes: Negative.  Negative for blurred vision, double vision and photophobia.  Respiratory: Negative.  Negative for cough and shortness of breath.   Cardiovascular: Negative.  Negative for chest pain, palpitations and leg swelling.  Gastrointestinal: Negative.  Negative for heartburn, nausea and vomiting.  Musculoskeletal: Negative.  Negative for falls and myalgias.  Neurological: Positive for dizziness. Negative for focal weakness, seizures and headaches.  Psychiatric/Behavioral: Negative for suicidal ideas. The patient is nervous/anxious.     Past Medical History:  Diagnosis Date  . Acute MI ([place]) 2009   STENTS PLACED  . Collagen vascular disease ([place])   . Diabetes mellitus without complication ([place])   . Hyperlipidemia   . Hypertension     Past Surgical History:  Procedure Laterality Date  . ANGIOPLASTY    . CARDIAC CATHETERIZATION      Family History  Problem Relation Age of Onset  . Stroke Father   . Alzheimer's disease Father   . High blood pressure Father   . Heart Problems Mother     Social History Reviewed with no changes to be made today.   Outpatient Medications Prior to Visit  Medication Sig Dispense Refill  . aspirin 325 MG EC tablet Take 1 tablet (325 mg total) by mouth daily. 90 tablet 3  .  atorvastatin (LIPITOR) 40 MG tablet Take 1 tablet (40 mg total) by mouth at bedtime. 90 tablet 3  . carvedilol (COREG) 3.125 MG tablet Take 1 tablet (3.125 mg total) by mouth 2 (two) times daily with a meal. 180 tablet 3  . clopidogrel (PLAVIX) 75 MG tablet Take 1 tablet  (75 mg total) by mouth daily. 90 tablet 3  . fish oil-omega-3 fatty acids 1000 MG capsule Take 1 g by mouth 2 (two) times daily.     . nicotine polacrilex (NICORETTE) 4 MG gum Take 1 each (4 mg total) by mouth as needed for smoking cessation. 100 tablet 1  . nitroGLYCERIN (NITROSTAT) 0.4 MG SL tablet Place 1 tablet (0.4 mg total) under the tongue every 5 (five) minutes as needed for chest pain. 20 tablet 3  . ranitidine (ZANTAC) 150 MG tablet Take 1 tablet (150 mg total) by mouth 2 (two) times daily. 60 tablet 2  . tamsulosin (FLOMAX) 0.4 MG CAPS capsule Take 1 capsule (0.4 mg total) by mouth daily. 90 capsule 3  . meclizine (ANTIVERT) 25 MG tablet Take 1 tablet (25 mg total) by mouth 3 (three) times daily as needed for dizziness. 16 tablet 1  . triamcinolone cream (KENALOG) 0.1 % Apply 1 application topically 2 (two) times daily. Apply to R hand (Patient not taking: Reported on 11/11/2017) 80 g 2  . triamcinolone ointment (KENALOG) 0.5 % Apply 1 application topically 2 (two) times daily. (Patient not taking: Reported on 11/11/2017) 30 g 0   No facility-administered medications prior to visit.     No Known Allergies     Objective:    BP 112/73   Pulse 62   Temp 98 F (36.7 C) (Oral)   Resp 16   Wt 205 lb 6.4 oz (93.2 kg)   SpO2 97%   BMI 32.17 kg/m  Wt Readings from Last 3 Encounters:  12/05/17 205 lb 6.4 oz (93.2 kg)  11/11/17 206 lb (93.4 kg)  09/16/17 201 lb (91.2 kg)    Physical Exam  Constitutional: He is oriented to person, place, and time. He appears well-developed and well-nourished. He is cooperative.  HENT:  Head: Normocephalic and atraumatic.  Eyes: EOM are normal.  Neck: Normal range of motion.  Cardiovascular: Normal rate, regular rhythm, normal heart sounds and intact distal pulses. Exam reveals no gallop and no friction rub.  No murmur heard. Pulmonary/Chest: Effort normal and breath sounds normal. No tachypnea. No respiratory distress. He has no decreased breath  sounds. He has no wheezes. He has no rhonchi. He has no rales. He exhibits no tenderness.  Abdominal: Soft. Bowel sounds are normal.  Musculoskeletal: Normal range of motion. He exhibits no edema.  Neurological: He is alert and oriented to person, place, and time. He has normal strength. He displays a negative Romberg sign. Coordination and gait normal.  Skin: Skin is warm and dry.  Psychiatric: He has a normal mood and affect. His behavior is normal. Judgment and thought content normal.  Nursing note and vitals reviewed.       Patient has been counseled extensively about nutrition and exercise as well as the importance of adherence with medications and regular follow-up. The patient was given clear instructions to go to ER or return to medical center if symptoms don't improve, worsen or new problems develop. The patient verbalized understanding.   Follow-up: Return in about 8 weeks (around 01/30/2018) for lab appointment/CBC.   Gildardo Pounds, FNP-BC [facility] [place],  [place] [phone]   12/05/2017, 12:43 PM

## 2017-12-05 NOTE — Progress Notes (Signed)
Pt is needing meclizine refilled

## 2017-12-15 MED FILL — ATORVASTATIN CALCIUM 40 MG: 40 | 90 days supply | Qty: 90 | Fill #0

## 2017-12-17 MED FILL — CARVEDILOL 3.125 MG TABLET: 3.125 | 90 days supply | Qty: 180 | Fill #0

## 2018-01-09 MED FILL — CLOPIDOGREL 75 MG TABLET: 75 | 90 days supply | Qty: 90 | Fill #0

## 2018-02-17 ENCOUNTER — Ambulatory Visit: Payer: Medicare HMO | Attending: Nurse Practitioner | Admitting: Nurse Practitioner

## 2018-02-17 ENCOUNTER — Encounter: Payer: Self-pay | Admitting: Nurse Practitioner

## 2018-02-17 VITALS — BP 115/69 | HR 67 | Temp 98.8°F | Resp 14 | Ht 67.0 in | Wt 206.0 lb

## 2018-02-17 DIAGNOSIS — F17200 Nicotine dependence, unspecified, uncomplicated: Secondary | ICD-10-CM | POA: Insufficient documentation

## 2018-02-17 DIAGNOSIS — I252 Old myocardial infarction: Secondary | ICD-10-CM | POA: Diagnosis not present

## 2018-02-17 DIAGNOSIS — E119 Type 2 diabetes mellitus without complications: Secondary | ICD-10-CM | POA: Diagnosis not present

## 2018-02-17 DIAGNOSIS — Z820 Family history of epilepsy and other diseases of the nervous system: Secondary | ICD-10-CM | POA: Insufficient documentation

## 2018-02-17 DIAGNOSIS — E785 Hyperlipidemia, unspecified: Secondary | ICD-10-CM | POA: Diagnosis not present

## 2018-02-17 DIAGNOSIS — Z8249 Family history of ischemic heart disease and other diseases of the circulatory system: Secondary | ICD-10-CM | POA: Insufficient documentation

## 2018-02-17 DIAGNOSIS — I10 Essential (primary) hypertension: Secondary | ICD-10-CM | POA: Diagnosis not present

## 2018-02-17 DIAGNOSIS — Z79899 Other long term (current) drug therapy: Secondary | ICD-10-CM | POA: Insufficient documentation

## 2018-02-17 DIAGNOSIS — Z823 Family history of stroke: Secondary | ICD-10-CM | POA: Insufficient documentation

## 2018-02-17 DIAGNOSIS — Z7902 Long term (current) use of antithrombotics/antiplatelets: Secondary | ICD-10-CM | POA: Insufficient documentation

## 2018-02-17 DIAGNOSIS — R059 Cough, unspecified: Secondary | ICD-10-CM

## 2018-02-17 DIAGNOSIS — D696 Thrombocytopenia, unspecified: Secondary | ICD-10-CM | POA: Insufficient documentation

## 2018-02-17 DIAGNOSIS — R609 Edema, unspecified: Secondary | ICD-10-CM | POA: Diagnosis present

## 2018-02-17 DIAGNOSIS — Z7982 Long term (current) use of aspirin: Secondary | ICD-10-CM | POA: Insufficient documentation

## 2018-02-17 DIAGNOSIS — R600 Localized edema: Secondary | ICD-10-CM | POA: Diagnosis not present

## 2018-02-17 DIAGNOSIS — Z955 Presence of coronary angioplasty implant and graft: Secondary | ICD-10-CM | POA: Diagnosis not present

## 2018-02-17 DIAGNOSIS — R05 Cough: Secondary | ICD-10-CM | POA: Diagnosis not present

## 2018-02-17 MED ORDER — GUAIFENESIN 400 MG PO TABS: 400.0000 mg | ORAL_TABLET | Freq: Four times a day (QID) | ORAL | 1 refills | Status: DC

## 2018-02-17 NOTE — Progress Notes (Signed)
Assessment & Plan:  Samuel Lawson was seen today for edema.  Diagnoses and all orders for this visit:  Thrombocytopenia ([place]) -     CBC  Bilateral leg edema STOP SMOKING: HE DECLINES SMOKING CESSATION AIDS TODAY INSTRUCTED TO WEAR BILATERAL COMPRESSION SOCKS DURING DAYTIME AS WELL AS WITH PROLONGED STANDING.   Cough in adult patient -     guaifenesin (HUMIBID E) 400 MG TABS tablet; Take 1 tablet (400 mg total) by mouth every 6 (six) hours. Stop SMOKING!!!   Patient has been counseled on age-appropriate routine health concerns for screening and prevention. These are reviewed and up-to-date. Referrals have been placed accordingly. Immunizations are up-to-date or declined.    Subjective:   Chief Complaint  Patient presents with  . Edema    Pt. is here for a sweling on his feet. Pt. stated the swelling was big before and it went down.    HPI Samuel Lawson 72 y.o. male presents to office today with complaints of BLE edema and chronic cough.  Edema Patient complains of edema. The location of the edema is lower leg(s) bilateral, ankle(s) bilateral, feet bilateral.  The edema has been gradually improving and is absent at night when lying down/legs elevated.  Onset of symptoms was a few days ago, gradually improving since that time. The edema is present in the afternoon and in the evening.  The swelling has been aggravated by dependency of involved area, relieved by elevation of involved area, and been associated with nothing. Cardiac risk factors include advanced age (older than 88 for men, 47 for women), dyslipidemia, hypertension, male gender, obesity (BMI >= 30 kg/m2), sedentary lifestyle and smoking/ tobacco exposure.  Cough Patient complains of productive cough with sputum described as white, yellow and thick.  Symptoms are intermittent and chronic in nature.  The cough is without wheezing, dyspnea or hemoptysis and is aggravated by nothing Associated symptoms include: "runny nose".  Patient does not have new pets. Patient does not have a history of asthma. Patient does not have a history of environmental allergens. Patient did not have recent travel. Patient does have a history of smoking. Patient  Has had previous Chest X-ray.    Review of Systems  Constitutional: Negative for fever, malaise/fatigue and weight loss.  HENT: Positive for congestion. Negative for nosebleeds.        Rhinnorhea  Eyes: Negative.  Negative for blurred vision, double vision and photophobia.  Respiratory: Positive for cough, sputum production and shortness of breath (with exertion).   Cardiovascular: Positive for leg swelling. Negative for chest pain and palpitations.  Gastrointestinal: Negative.  Negative for heartburn, nausea and vomiting.  Musculoskeletal: Negative.  Negative for myalgias.  Neurological: Negative.  Negative for dizziness, focal weakness, seizures and headaches.  Psychiatric/Behavioral: Negative.  Negative for suicidal ideas.    Past Medical History:  Diagnosis Date  . Acute MI ([place]) 2009   STENTS PLACED  . Collagen vascular disease ([place])   . Diabetes mellitus without complication ([place])   . Hyperlipidemia   . Hypertension     Past Surgical History:  Procedure Laterality Date  . ANGIOPLASTY    . CARDIAC CATHETERIZATION      Family History  Problem Relation Age of Onset  . Stroke Father   . Alzheimer's disease Father   . High blood pressure Father   . Heart Problems Mother     Social History Reviewed with no changes to be made today.   Outpatient Medications Prior to Visit  Medication Sig Dispense  Refill  . aspirin 325 MG EC tablet Take 1 tablet (325 mg total) by mouth daily. 90 tablet 3  . atorvastatin (LIPITOR) 40 MG tablet Take 1 tablet (40 mg total) by mouth at bedtime. 90 tablet 3  . carvedilol (COREG) 3.125 MG tablet Take 1 tablet (3.125 mg total) by mouth 2 (two) times daily with a meal. 180 tablet 3  . clopidogrel (PLAVIX) 75 MG tablet Take 1  tablet (75 mg total) by mouth daily. 90 tablet 3  . fish oil-omega-3 fatty acids 1000 MG capsule Take 1 g by mouth 2 (two) times daily.     . meclizine (ANTIVERT) 25 MG tablet Take 1 tablet (25 mg total) by mouth 3 (three) times daily as needed for dizziness. 16 tablet 1  . nicotine polacrilex (NICORETTE) 4 MG gum Take 1 each (4 mg total) by mouth as needed for smoking cessation. 100 tablet 1  . ranitidine (ZANTAC) 150 MG tablet Take 1 tablet (150 mg total) by mouth 2 (two) times daily. 60 tablet 2  . tamsulosin (FLOMAX) 0.4 MG CAPS capsule Take 1 capsule (0.4 mg total) by mouth daily. 90 capsule 3  . nitroGLYCERIN (NITROSTAT) 0.4 MG SL tablet Place 1 tablet (0.4 mg total) under the tongue every 5 (five) minutes as needed for chest pain. (Patient not taking: Reported on 02/17/2018) 20 tablet 3   No facility-administered medications prior to visit.     No Known Allergies     Objective:    BP 115/69 (BP Location: Left Arm, Patient Position: Sitting, Cuff Size: Normal)   Pulse 67   Temp 98.8 F (37.1 C) (Oral)   Resp 14   Ht 5\' 7"  (1.702 m)   Wt 206 lb (93.4 kg)   SpO2 97%   BMI 32.26 kg/m  Wt Readings from Last 3 Encounters:  02/17/18 206 lb (93.4 kg)  12/05/17 205 lb 6.4 oz (93.2 kg)  11/11/17 206 lb (93.4 kg)    Physical Exam  Constitutional: He is oriented to person, place, and time. He appears well-developed and well-nourished. He is cooperative.  HENT:  Head: Normocephalic and atraumatic.  Eyes: EOM are normal.  Neck: Normal range of motion.  Cardiovascular: Normal rate, regular rhythm, normal heart sounds and intact distal pulses. Exam reveals no gallop and no friction rub.  No murmur heard. Pulmonary/Chest: Effort normal. No accessory muscle usage or stridor. Tachypnea noted. No respiratory distress. He has no decreased breath sounds. He has no wheezes. He has rhonchi in the right middle field, the right lower field, the left middle field and the left lower field. He has  no rales. He exhibits no tenderness.  Abdominal: Bowel sounds are normal.  Musculoskeletal: Normal range of motion. He exhibits edema (mild non pitting).       Right ankle: He exhibits swelling. He exhibits normal range of motion and no deformity.       Left ankle: He exhibits swelling. He exhibits normal range of motion and no deformity.       Right lower leg: He exhibits no edema.       Left lower leg: He exhibits no edema.  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 Coordination normal.  Skin: Skin is warm and dry.  Psychiatric: He has a normal mood and affect. His behavior is normal. Judgment and thought content normal.  Nursing note and vitals reviewed.     Patient has been counseled extensively about  nutrition and exercise as well as the importance of adherence with medications and regular follow-up. The patient was given clear instructions to go to ER or return to medical center if symptoms don't improve, worsen or new problems develop. The patient verbalized understanding.   Follow-up: Return in about 3 months (around 05/20/2018).   Gildardo Pounds, FNP-BC [facility] [place], [place]   02/17/2018, 4:59 PM

## 2018-02-17 NOTE — Patient Instructions (Signed)
Edema Edema is when you have too much fluid in your body or under your skin. Edema may make your legs, feet, and ankles swell up. Swelling is also common in looser tissues, like around your eyes. This is a common condition. It gets more common as you get older. There are many possible causes of edema. Eating too much salt (sodium) and being on your feet or sitting for a long time can cause edema in your legs, feet, and ankles. Hot weather may make edema worse. Edema is usually painless. Your skin may look swollen or shiny. Follow these instructions at home:  Keep the swollen body part raised (elevated) above the level of your heart when you are sitting or lying down.  Do not sit still or stand for a long time.  Do not wear tight clothes. Do not wear garters on your upper legs.  Exercise your legs. This can help the swelling go down.  Wear elastic bandages or support stockings as told by your doctor.  Eat a low-salt (low-sodium) diet to reduce fluid as told by your doctor.  Depending on the cause of your swelling, you may need to limit how much fluid you drink (fluid restriction).  Take over-the-counter and prescription medicines only as told by your doctor. Contact a doctor if:  Treatment is not working.  You have heart, liver, or kidney disease and have symptoms of edema.  You have sudden and unexplained weight gain. Get help right away if:  You have shortness of breath or chest pain.  You cannot breathe when you lie down.  You have pain, redness, or warmth in the swollen areas.  You have heart, liver, or kidney disease and get edema all of a sudden.  You have a fever and your symptoms get worse all of a sudden. Summary  Edema is when you have too much fluid in your body or under your skin.  Edema may make your legs, feet, and ankles swell up. Swelling is also common in looser tissues, like around your eyes.  Raise (elevate) the swollen body part above the level of your  heart when you are sitting or lying down.  Follow your doctor's instructions about diet and how much fluid you can drink (fluid restriction). This information is not intended to replace advice given to you by your health care provider. Make sure you discuss any questions you have with your health care provider. Document Released: 02/12/2008 Document Revised: 09/13/2016 Document Reviewed: 09/13/2016 Elsevier Interactive Patient Education  2017 Else[employer] Inc.  Varicose Veins Varicose veins are veins that have become enlarged and twisted. They are usually seen in the legs but can occur in other parts of the body as well. What are the causes? This condition is the result of valves in the veins not working properly. Valves in the veins help to return blood from the leg to the heart. If these valves are damaged, blood flows backward and backs up into the veins in the leg near the skin. This causes the veins to become larger. What increases the risk? People who are on their feet a lot, who are pregnant, or who are overweight are more likely to develop varicose veins. What are the signs or symptoms?  Bulging, twisted-appearing, bluish veins, most commonly found on the legs.  Leg pain or a feeling of heaviness. These symptoms may be worse at the end of the day.  Leg swelling.  Changes in skin color. How is this diagnosed? A health care provider  can usually diagnose varicose veins by examining your legs. Your health care provider may also recommend an ultrasound of your leg veins. How is this treated? Most varicose veins can be treated at home.However, other treatments are available for people who have persistent symptoms or want to improve the cosmetic appearance of the varicose veins. These treatment options include:  Sclerotherapy. A solution is injected into the vein to close it off.  Laser treatment. A laser is used to heat the vein to close it off.  Radiofrequency vein ablation. An  electrical current produced by radio waves is used to close off the vein.  Phlebectomy. The vein is surgically removed through small incisions made over the varicose vein.  Vein ligation and stripping. The vein is surgically removed through incisions made over the varicose vein after the vein has been tied (ligated).  Follow these instructions at home:  Do not stand or sit in one position for long periods of time. Do not sit with your legs crossed. Rest with your legs raised during the day.  Wear compression stockings as directed by your health care provider. These stockings help to prevent blood clots and reduce swelling in your legs.  Do not wear other tight, encircling garments around your legs, pelvis, or waist.  Walk as much as possible to increase blood flow.  Raise the foot of your bed at night with 2-inch blocks.  If you get a cut in the skin over the vein and the vein bleeds, lie down with your leg raised and press on it with a clean cloth until the bleeding stops. Then place a bandage (dressing) on the cut. See your health care provider if it continues to bleed. Contact a health care provider if:  The skin around your ankle starts to break down.  You have pain, redness, tenderness, or hard swelling in your leg over a vein.  You are uncomfortable because of leg pain. This information is not intended to replace advice given to you by your health care provider. Make sure you discuss any questions you have with your health care provider. Document Released: 06/05/2005 Document Revised: 02/01/2016 Document Reviewed: 02/27/2016 Elsevier Interactive Patient Education  2017 Else[employer] Inc.  Thrombocytopenia Thrombocytopenia means that you have a low number of platelets in your blood. Platelets are tiny cells in the blood. When you bleed, they clump together at the cut or injury to stop the bleeding. This is called blood clotting. Not having enough platelets can cause bleeding  problems. Follow these instructions at home: General instructions  Check your skin and inside your mouth for bruises or blood as told by your doctor.  Check to see if there is blood in your spit (sputum), pee (urine), and poop (stool). Do this as told by your doctor.  Ask your doctor if you can drink alcohol.  Take over-the-counter and prescription medicines only as told by your doctor.  Tell all of your doctors that you have this condition. Be sure to tell your dentist and eye doctor too. Activity  Do not do activities that can cause bumps or bruises until your doctor says it is okay.  Be careful not to cut yourself: ? When you shave. ? When you use scissors, needles, knives, or other tools.  Be careful not to burn yourself: ? When you use an iron. ? When you cook. Contact a doctor if:  You have bruises and you do not know why. Get help right away if:  You are bleeding anywhere on  your body.  You have blood in your spit, pee, or poop. This information is not intended to replace advice given to you by your health care provider. Make sure you discuss any questions you have with your health care provider. Document Released: 08/15/2011 Document Revised: 04/28/2016 Document Reviewed: 02/27/2015 Elsevier Interactive Patient Education  [employer].

## 2018-02-18 LAB — CBC
Hematocrit: 44.9 % (ref 37.5–51.0)
Hemoglobin: 15 g/dL (ref 13.0–17.7)
MCH: 30.9 pg (ref 26.6–33.0)
MCHC: 33.4 g/dL (ref 31.5–35.7)
MCV: 93 fL (ref 79–97)
PLATELETS: 148 10*3/uL — AB (ref 150–450)
RBC: 4.85 x10E6/uL (ref 4.14–5.80)
RDW: 13.6 % (ref 12.3–15.4)
WBC: 7.3 10*3/uL (ref 3.4–10.8)

## 2018-02-19 MED FILL — TRAVEL SICKNESS 25 MG TAB C: 25 | 5 days supply | Qty: 16 | Fill #1

## 2018-02-23 ENCOUNTER — Other Ambulatory Visit: Payer: Self-pay | Admitting: Nurse Practitioner

## 2018-02-23 DIAGNOSIS — K219 Gastro-esophageal reflux disease without esophagitis: Secondary | ICD-10-CM

## 2018-02-23 MED FILL — raNITIdine HCL 150 MG TABS: 150 | 30 days supply | Qty: 60 | Fill #0

## 2018-02-25 ENCOUNTER — Telehealth: Payer: Self-pay

## 2018-02-25 NOTE — Telephone Encounter (Signed)
CMA attempt to call patient to inform on lab results.  No answer and unable to leave a VM due to no voicemail box has been set up.  If patient call back, please inform:  Platelets are improving. Will continue to monitor  A letter will be send out.

## 2018-02-25 NOTE — Telephone Encounter (Signed)
-----   Message from Gildardo Pounds, NP sent at 02/23/2018 10:52 PM EDT ----- Platelets are improving. Will continue to monitor

## 2018-03-10 MED FILL — CARVEDILOL 3.125 MG TABLET: 3.125 | 90 days supply | Qty: 180 | Fill #1

## 2018-03-10 MED FILL — ATORVASTATIN CALCIUM 40 MG: 40 | 90 days supply | Qty: 90 | Fill #1

## 2018-03-10 MED FILL — TAMSULOSIN HCL 0.4 MG CAP: 0.4 | 90 days supply | Qty: 90 | Fill #1

## 2018-03-18 MED FILL — raNITIdine HCL 150 MG TABS: 150 | 30 days supply | Qty: 60 | Fill #1

## 2018-03-19 DIAGNOSIS — H6123 Impacted cerumen, bilateral: Secondary | ICD-10-CM | POA: Insufficient documentation

## 2018-03-19 DIAGNOSIS — H60333 Swimmer's ear, bilateral: Secondary | ICD-10-CM | POA: Insufficient documentation

## 2018-03-19 MED FILL — CIPRODEX OTIC SUSPENSION: 0.3-0.1 | 10 days supply | Qty: 8 | Fill #0

## 2018-03-31 ENCOUNTER — Telehealth: Payer: Self-pay

## 2018-03-31 NOTE — Telephone Encounter (Signed)
PT CALLED BACK, WAS INFORMED ABOUT AWV, PT WAS VERY AGREEABLE TO SEEING [facility] FOR AWV, THE APPT WAS SCHEDULED.

## 2018-04-09 ENCOUNTER — Ambulatory Visit: Payer: Medicare HMO | Attending: Nurse Practitioner | Admitting: Pharmacist

## 2018-04-09 ENCOUNTER — Encounter: Payer: Self-pay | Admitting: Pharmacist

## 2018-04-09 VITALS — BP 120/74 | HR 58 | Temp 98.3°F | Ht 68.0 in | Wt 206.6 lb

## 2018-04-09 DIAGNOSIS — Z0000 Encounter for general adult medical examination without abnormal findings: Secondary | ICD-10-CM

## 2018-04-09 MED FILL — CLOPIDOGREL 75 MG TABLET: 75 | 90 days supply | Qty: 90 | Fill #1

## 2018-04-09 NOTE — Patient Instructions (Signed)
Thank you for coming to see me today.   We saw you for your Annual Wellness Visit today. This was basically a check-up that helps [place] keep your medical information updated.   I will forward this information to your primary care doctor, Zelda. She will look this over and follow-up with you concerning any additional steps to help increase your wellness. These steps could include setting new goals, getting certain vaccines, receiving appropriate screenings, or making steps to address concerns identified during this wellness visit.   Things that you should discuss with your doctor at your next visit include:  1. Colonoscopy for cancer screening 2. Increasing your physical activity/exercise 3. Stopping the use of tobacco productions (smoking cigarettes) 4. Receiving pneumonia and tetanus vaccines  If you have any questions about medications, how to use them, or medication side effects please let me know. I can schedule you an appointment to help you manage your medication list.   If anything happens at home and you need to see your doctor, please call the clinic.

## 2018-04-09 NOTE — Progress Notes (Signed)
Subjective:   Samuel Lawson is a 72 y.o. male who presents for Medicare Annual/Subsequent preventive examination.  Patient Problem List:  Cardiovascular and Mediastinum:  CAD involving native coronary artery of native heart without angina pectoris  Essential HTN Orthostatic hypotension Cardiac Risk Factors include: advanced age (>62men, >13 women);hypertension;obesity (BMI >30kg/m2);male gender;dyslipidemia;sedentary lifestyle;smoking/ tobacco exposure Digestive GERD without esophagitis Musculoskeletal and Integument  Dyshidrotic hand dermatitis  Genitourinary: BPH Other:  Hyperlipidemia, obesity, disequilibrium, vertigo, nicotine dependence  Left arm pain Left medial knee pain  Current Medical Providers and Suppliers:  Gildardo Pounds, NP as PCP - General (Nurse Practitioner)   Providers listed in Care Everywhere: Dorna Bloom, [place], Audiology, [address] [place], Latricia Heft, [place], [place] and Throat, [place]  No referrals currently  in 2019   Advanced Directive Summary: Advanced Directives 04/09/2018 04/09/2018 09/16/2017 04/12/2017 04/10/2017 04/01/2017 01/21/2017  Does Patient Have a Medical Advance Directive? No No No No No Yes No  Type of Advance Directive - - - Event organiser -  Would patient like information on creating a medical advance directive? No - Patient declined No - Patient declined No - Patient declined No - Patient declined - - -    Objective:   Vitals: BP 120/74   Pulse (!) 58   Temp 98.3 F (36.8 C)   Ht 5\' 8"  (1.727 m)   Wt 206 lb 9.6 oz (93.7 kg)   BMI 31.41 kg/m   Body mass index is 31.41 kg/m.  Social/Medical/Family History:  Social History   Tobacco Use  Smoking Status Current Every Day Smoker  . Packs/day: 1.00  . Types: Cigarettes  Smokeless Tobacco Never Used  Tobacco Comment   states new years resolution is to quit smoking     Ready to quit: No Counseling given: Yes Comment: states new years resolution is to quit smoking   Past Medical History:  Diagnosis Date  . 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 Collagen vascular disease ([place])   . Diabetes mellitus without complication ([place])   . Hyperlipidemia   . Hypertension    Past Surgical History:  Procedure Laterality Date  . ANGIOPLASTY    . CARDIAC CATHETERIZATION     Family History  Problem Relation Age of Onset  . Stroke Father   . Alzheimer's disease Father   . High blood pressure Father   . Heart Problems Mother    Social History   Socioeconomic History  . Marital status: Single    Spouse name: Not on file  . Number of children: 2  . Years of education: B.S.  . Highest education level: Not on file  Occupational History    Employer: SELF EMPLOYED    Comment: Computers - Works for [employer]  . Financial resource strain: Not hard at all  . Food insecurity:    Worry: Never true    Inability: Never true  . Transportation needs:    Medical: No    Non-medical: No  Tobacco Use  . Smoking status: Current Every Day Smoker    Packs/day: 1.00    Types: Cigarettes  . Smokeless tobacco: Never Used  . Tobacco comment: states new years resolution is to quit smoking  Substance and Sexual Activity  . Alcohol use: No  . Drug use: No  . Sexual activity: Not on  file  Lifestyle  . Physical activity:    Days per week: 0 days    Minutes per session: 0 min  . Stress: Not at all  Relationships  . Social connections:    Talks on phone: More than three times a week    Gets together: More than three times a week    Attends religious service: Never    Active member of club or organization: No    Attends meetings of clubs or organizations: Never    Relationship status: Divorced  Other Topics Concern  . Not on file  Social History Narrative   Patient lives at home alone and he is  single. Reports being married in the past. Patient is self employed and works with computers.   Education B.S.   Right handed.   Caffeine one cup of tea daily and two pepsi daily.    Outpatient Encounter Medications as of 04/09/2018  Medication Sig  . aspirin 325 MG EC tablet Take 1 tablet (325 mg total) by mouth daily.  Marland Kitchen atorvastatin (LIPITOR) 40 MG tablet Take 1 tablet (40 mg total) by mouth at bedtime.  . carvedilol (COREG) 3.125 MG tablet Take 1 tablet (3.125 mg total) by mouth 2 (two) times daily with a meal.  . clopidogrel (PLAVIX) 75 MG tablet Take 1 tablet (75 mg total) by mouth daily.  . fish oil-omega-3 fatty acids 1000 MG capsule Take 1 g by mouth 2 (two) times daily.   . meclizine (ANTIVERT) 25 MG tablet Take 1 tablet (25 mg total) by mouth 3 (three) times daily as needed for dizziness.  . ranitidine (ZANTAC) 150 MG tablet TAKE 1 TABLET BY MOUTH 2 TIMES DAILY.  . tamsulosin (FLOMAX) 0.4 MG CAPS capsule Take 1 capsule (0.4 mg total) by mouth daily.  . nicotine polacrilex (NICORETTE) 4 MG gum Take 1 each (4 mg total) by mouth as needed for smoking cessation. (Patient not taking: Reported on 04/09/2018)  . nitroGLYCERIN (NITROSTAT) 0.4 MG SL tablet Place 1 tablet (0.4 mg total) under the tongue every 5 (five) minutes as needed for chest pain. (Patient not taking: Reported on 02/17/2018)  . [DISCONTINUED] albuterol (PROVENTIL HFA;VENTOLIN HFA) 108 (90 BASE) MCG/ACT inhaler Inhale 1-2 puffs into the lungs every 6 (six) hours as needed for wheezing or shortness of breath. Or persistent cough (Patient not taking: Reported on 12/14/2014)  . [DISCONTINUED] guaifenesin (HUMIBID E) 400 MG TABS tablet Take 1 tablet (400 mg total) by mouth every 6 (six) hours.  . [DISCONTINUED] promethazine (PHENERGAN) 25 MG tablet Take 1 tablet (25 mg total) by mouth every 6 (six) hours as needed for nausea.   No facility-administered encounter medications on file as of 04/09/2018.    Clinical Intake:  Pre-visit  preparation completed: Yes  Pain : No/denies pain  BMI - recorded: 31.41 Nutritional Status: BMI > 30  Obese Nutritional Risks: None Diabetes: No  How often do you need to have someone help you when you read instructions, pamphlets, or other written materials from your doctor or pharmacy?: 1 - Never What is the last grade level you completed in school?: 12th - Pt has college degree from [employer]?: No  Functional Ability/Safety Screening/Health Risk Assessment:  Activities of Daily Living In your present state of health, do you have any difficulty performing the following activities: 04/09/2018  Hearing? Y  Comment Patient has difficulty hearing and has hearing aids. He states that he doesn't use them.  Vision? N  Difficulty concentrating or making  decisions? N  Walking or climbing stairs? N  Dressing or bathing? N  Doing errands, shopping? N  Preparing Food and eating ? N  Using the Toilet? N  In the past six months, have you accidently leaked urine? N  Do you have problems with loss of bowel control? N  Managing your Medications? N  Managing your Finances? N  Housekeeping or managing your Housekeeping? N  Some recent data might be hidden   Fall Risk Fall Risk  04/09/2018 04/10/2017 01/21/2017 11/15/2016 08/16/2016  Falls in the past year? No No No No No  Comment - - - - Emmi Telephone Survey: data to providers prior to load  Risk for fall due to : Impaired balance/gait - - - -  Risk for fall due to: Comment Pt states that has vertigo and sometimes experiences dizziness when he changes body position suddenly. Of note, he has a noted history of orthostatic hypotension. - - - -   Is the patient's home free of loose throw rugs in walkways, pet beds, electrical cords, etc?   yes      Grab bars in the bathroom? no      Handrails on the stairs?   yes      Adequate lighting?   yes  Timed Get Up and Go Performed: 6 seconds  Depression Screen PHQ 2/9 Scores 04/09/2018  02/17/2018 12/05/2017 11/11/2017  PHQ - 2 Score 0 0 0 0  PHQ- 9 Score - 0 0 2   Cognitive Function MMSE - Mini Mental State Exam 04/09/2018  Orientation to time 5  Orientation to Place 5  Registration 3  Attention/ Calculation 4  Recall 2  Language- name 2 objects 2  Language- repeat 1  Language- follow 3 step command 3  Language- read & follow direction 1  Write a sentence 1  Copy design 1  Total score 28   Age Appropriate Screening and Vaccination Schedule   Documented in the Health maintenance section in [facility]. I have listed them below for reference.   Immunizations: PNA: no vaccine history in [facility]; patient denies receiving   Tetanus: no vaccine history in [facility]; patient denies receiving  Shingles: no vaccine history in [facility]; patient denies recieving Influenza: last given 06/22/15  Screenings: CRC: no colonoscopy in [facility]; patient denies ever having one Hep C: completed 01/22/16 A1c: completed 11/12/17  Assessment:  This is a routine AWV for Mr. Cass.   HRA/Patient Safety  No falls in the past year. Of note, patient has orthostatic hypotension noted on his problem list. Today, he described occasional vertigo and dizziness associated with rapid body position changes. Discussed that this places him at an increased risk of falls. Plan to avoid falls, including sitting up in bed before standing, reviewed. Depression screening negative with 0/2 PHQ-2 score. No additional depressive symptoms identified. MMSE 28/30.   Social/Medical/Family History  Updated histories in [facility]. Patient continues to smoke. Reports smoking since age 25. Currently smokes 1 pack per day. When cessation was brought up, pt stated that he knows the benefits of quitting but he hasn't made his mind up at this time.   Exercise Activities and Dietary Recommendations Pt does not participate in exercise currently. Benefits of 150 mins/week of aerobic exercise reviewed. Dietary indiscretion exists. Pt with BMI > 30. Obesity  counseling given.   Functional Ability Pt independent with ADLs. Denies visual impairment. Denies urinary/fecal incontinence. He does have hearing aids but does not use. He is followed by Audiology at [facility].  Medication Review Medication regimen reviewed. Patient with CAD, MI with cardiac cath, HTN, and hyperlipidemia. Pt smokes. High intensity statin therapy and aspirin indicated. Encouraged continued compliance with atorvastatin and ASA.   Screening/Vaccines Patient is due PNA, shingles, and tetanus vaccines. Risk/benefit discussed. When I offered these, pt declined. Declined colonoscopy. Will forward information to his PCP.   Plan:    AWV:  Pertinent information identified has been forwarded to patient's PCP.  I have personally reviewed and noted the following in the patient's chart:   . Medical and social history . Use of alcohol, tobacco or illicit drugs  . Current medications and supplements . Functional ability and status . Nutritional status . Physical activity . Advanced directives . List of other physicians . Hospitalizations, surgeries, and ER visits in previous 12 months . Vitals . Screenings to include cognitive, depression, and falls . Referrals and appointments  In addition, I have reviewed and discussed with patient certain preventive protocols, quality metrics, and best practice recommendations. A written personalized care plan for preventive services as well as general preventive health recommendations were provided to patient.     [place], RPH-CPP  04/09/2018

## 2018-04-20 ENCOUNTER — Other Ambulatory Visit: Payer: Self-pay

## 2018-04-20 DIAGNOSIS — K219 Gastro-esophageal reflux disease without esophagitis: Secondary | ICD-10-CM

## 2018-04-20 MED ORDER — RANITIDINE HCL 150 MG PO TABS: 150.0000 mg | ORAL_TABLET | Freq: Two times a day (BID) | ORAL | 0 refills | Status: DC

## 2018-04-20 MED FILL — raNITIdine HCL 150 MG TABS: 150 | 90 days supply | Qty: 180 | Fill #0

## 2018-05-20 ENCOUNTER — Encounter: Payer: Self-pay | Admitting: Nurse Practitioner

## 2018-05-20 ENCOUNTER — Ambulatory Visit: Payer: Medicare HMO | Attending: Nurse Practitioner | Admitting: Nurse Practitioner

## 2018-05-20 VITALS — BP 128/75 | HR 52 | Temp 98.0°F | Ht 67.0 in | Wt 205.4 lb

## 2018-05-20 DIAGNOSIS — E119 Type 2 diabetes mellitus without complications: Secondary | ICD-10-CM | POA: Insufficient documentation

## 2018-05-20 DIAGNOSIS — G8929 Other chronic pain: Secondary | ICD-10-CM | POA: Insufficient documentation

## 2018-05-20 DIAGNOSIS — K219 Gastro-esophageal reflux disease without esophagitis: Secondary | ICD-10-CM | POA: Insufficient documentation

## 2018-05-20 DIAGNOSIS — E785 Hyperlipidemia, unspecified: Secondary | ICD-10-CM | POA: Insufficient documentation

## 2018-05-20 DIAGNOSIS — Z79899 Other long term (current) drug therapy: Secondary | ICD-10-CM | POA: Insufficient documentation

## 2018-05-20 DIAGNOSIS — I2510 Atherosclerotic heart disease of native coronary artery without angina pectoris: Secondary | ICD-10-CM | POA: Insufficient documentation

## 2018-05-20 DIAGNOSIS — I252 Old myocardial infarction: Secondary | ICD-10-CM | POA: Insufficient documentation

## 2018-05-20 DIAGNOSIS — Z0100 Encounter for examination of eyes and vision without abnormal findings: Secondary | ICD-10-CM

## 2018-05-20 DIAGNOSIS — Z7902 Long term (current) use of antithrombotics/antiplatelets: Secondary | ICD-10-CM | POA: Insufficient documentation

## 2018-05-20 DIAGNOSIS — M25562 Pain in left knee: Secondary | ICD-10-CM | POA: Insufficient documentation

## 2018-05-20 DIAGNOSIS — I10 Essential (primary) hypertension: Secondary | ICD-10-CM | POA: Diagnosis not present

## 2018-05-20 DIAGNOSIS — Z955 Presence of coronary angioplasty implant and graft: Secondary | ICD-10-CM | POA: Insufficient documentation

## 2018-05-20 DIAGNOSIS — N400 Enlarged prostate without lower urinary tract symptoms: Secondary | ICD-10-CM | POA: Insufficient documentation

## 2018-05-20 DIAGNOSIS — Z7982 Long term (current) use of aspirin: Secondary | ICD-10-CM | POA: Insufficient documentation

## 2018-05-20 DIAGNOSIS — R42 Dizziness and giddiness: Secondary | ICD-10-CM

## 2018-05-20 MED ORDER — MISC. DEVICES MISC: 0 refills | Status: DC

## 2018-05-20 MED ORDER — ATORVASTATIN CALCIUM 40 MG PO TABS: 40.0000 mg | ORAL_TABLET | Freq: Every day | ORAL | 3 refills | Status: DC

## 2018-05-20 MED ORDER — RANITIDINE HCL 150 MG PO TABS: 150.0000 mg | ORAL_TABLET | Freq: Two times a day (BID) | ORAL | 0 refills | Status: DC

## 2018-05-20 MED ORDER — OMEGA-3-ACID ETHYL ESTERS 1 G PO CAPS: 1.0000 g | ORAL_CAPSULE | Freq: Two times a day (BID) | ORAL | 2 refills | Status: DC

## 2018-05-20 MED ORDER — MECLIZINE HCL 25 MG PO TABS: 25.0000 mg | ORAL_TABLET | Freq: Three times a day (TID) | ORAL | 1 refills | Status: DC | PRN

## 2018-05-20 MED ORDER — CARVEDILOL 3.125 MG PO TABS: 3.1250 mg | ORAL_TABLET | Freq: Two times a day (BID) | ORAL | 3 refills | Status: DC

## 2018-05-20 MED ORDER — TAMSULOSIN HCL 0.4 MG PO CAPS: 0.4000 mg | ORAL_CAPSULE | Freq: Every day | ORAL | 3 refills | Status: DC

## 2018-05-20 MED ORDER — CLOPIDOGREL BISULFATE 75 MG PO TABS: 75.0000 mg | ORAL_TABLET | Freq: Every day | ORAL | 3 refills | Status: DC

## 2018-05-20 MED FILL — OMEGA-3 ETHYL ESTERS 1 GM C: 1 | 30 days supply | Qty: 60 | Fill #0

## 2018-05-20 MED FILL — MECLIZINE 25 MG TABLET: 25 | 5 days supply | Qty: 16 | Fill #0

## 2018-05-20 NOTE — Progress Notes (Signed)
Assessment & Plan:  Moussa was seen today for follow-up.  Diagnoses and all orders for this visit:  Essential hypertension -     carvedilol (COREG) 3.125 MG tablet; Take 1 tablet (3.125 mg total) by mouth 2 (two) times daily with a meal. Continue all antihypertensives as prescribed.  Remember to bring in your blood pressure log with you for your follow up appointment.  DASH/Mediterranean Diets are healthier choices for HTN.    Encounter for complete eye exam -     Ambulatory referral to Ophthalmology  Chronic pain of left knee -     DG Knee Complete 4 Views Left; Future -     Misc. Devices MISC; Please provide patient with insurance approved custom compression socks  Coronary artery disease involving native coronary artery of native heart without angina pectoris -     carvedilol (COREG) 3.125 MG tablet; Take 1 tablet (3.125 mg total) by mouth 2 (two) times daily with a meal. -     clopidogrel (PLAVIX) 75 MG tablet; Take 1 tablet (75 mg total) by mouth daily.  Hyperlipidemia LDL goal <100 -     atorvastatin (LIPITOR) 40 MG tablet; Take 1 tablet (40 mg total) by mouth at bedtime. -     omega-3 acid ethyl esters (LOVAZA) 1 g capsule; Take 1 capsule (1 g total) by mouth 2 (two) times daily. INSTRUCTIONS: Work on a low fat, heart healthy diet and participate in regular aerobic exercise program by working out at least 150 minutes per week; 5 days a week-30 minutes per day. Avoid red meat, fried foods. junk foods, sodas, sugary drinks, unhealthy snacking, alcohol and smoking.  Drink at least 48oz of water per day and monitor your carbohydrate intake daily.    Gastroesophageal reflux disease without esophagitis -     ranitidine (ZANTAC) 150 MG tablet; Take 1 tablet (150 mg total) by mouth 2 (two) times daily. INSTRUCTIONS: Avoid GERD Triggers: acidic, spicy or fried foods, caffeine, coffee, sodas,  alcohol and chocolate.   Benign prostatic hyperplasia, unspecified whether lower urinary  tract symptoms present Chronic. Symptoms well controlled.  -     tamsulosin (FLOMAX) 0.4 MG CAPS capsule; Take 1 capsule (0.4 mg total) by mouth daily.  Disequilibrium -     Ambulatory referral to Cardiology -     meclizine (ANTIVERT) 25 MG tablet; Take 1 tablet (25 mg total) by mouth 3 (three) times daily as needed for dizziness. -     CBC    Patient has been counseled on age-appropriate routine health concerns for screening and prevention. These are reviewed and up-to-date. Referrals have been placed accordingly. Immunizations are up-to-date or declined.    Subjective:   Chief Complaint  Patient presents with  . Follow-up    Pt. is here for hypertension follow-up.   HPI Samuel Lawson 72 y.o. male presents to office today for BP recheck.   CHRONIC HYPERTENSION  Blood pressure range BP Readings from Last 3 Encounters:  05/20/18 128/75  04/09/18 120/74  02/17/18 115/69   Chest pain: no   Dyspnea: yes   Claudication: no  Medication compliance: yes, Taking carvedilol 3.125 mg BID,   Medication Side Effects  Lightheadedness: no   Urinary frequency: no   Edema: yes   Impotence: no  Preventitive Healthcare:  Exercise: no   Diet Pattern: diet: general  Salt Restriction:  no    Knee Pain Onset over a year ago. He had picked up a microwave and went to turn and felt  something in his left knee give way. Pain is worse with prolonged walking and standing. Relieving factors: Rest. Pain is described as sharp and aching. He denies any falls or sensation of knee giving out.   GERD Chronic and well controlled with zantac 150 mg BID. He denies melena, hematochezia or hemoptysis.   Hyperlipidemia Patient presents for follow up to hyperlipidemia.  He is medication compliant taking atorvastatin 40 mg daily. He is not diet compliant and denies chest pain, dyspnea, exertional chest pressure/discomfort, fatigue, feeding intolerance and skin xanthelasma or statin intolerance including  myalgias.  Lab Results  Component Value Date   CHOL 127 11/12/2017   Lab Results  Component Value Date   HDL 47 11/12/2017   Lab Results  Component Value Date   LDLCALC 51 11/12/2017   Lab Results  Component Value Date   TRIG 143 11/12/2017   Lab Results  Component Value Date   CHOLHDL 2.7 11/12/2017   Dizziness/Disequilibrium Chronic. Meclizine provides little relief. ENT has recommended he wear his hearing aids which he has been doing however he continue with disequilibrium. He continues to smoke. He does have cardiac risk factors. It may be helpful to rule out any CAD/carotid stenosis.  Head CT 09-2017 was essentially normal.   Review of Systems  Constitutional: Negative for fever, malaise/fatigue and weight loss.  HENT: Positive for hearing loss. Negative for nosebleeds.   Eyes: Negative.  Negative for blurred vision, double vision and photophobia.  Respiratory: Negative.  Negative for cough and shortness of breath.   Cardiovascular: Positive for leg swelling. Negative for chest pain and palpitations.  Gastrointestinal: Positive for heartburn. Negative for abdominal pain, blood in stool, constipation, diarrhea, melena, nausea and vomiting.  Genitourinary: Positive for frequency.  Musculoskeletal: Positive for joint pain (left knee). Negative for myalgias.  Neurological: Positive for dizziness. Negative for focal weakness, seizures and headaches.  Psychiatric/Behavioral: Negative.  Negative for suicidal ideas.    Past Medical History:  Diagnosis Date  . Acute MI ([place]) 2009   STENTS PLACED  . Collagen vascular disease ([place])   . Diabetes mellitus without complication ([place])   . Hyperlipidemia   . Hypertension     Past Surgical History:  Procedure Laterality Date  . ANGIOPLASTY    . CARDIAC CATHETERIZATION      Family History  Problem Relation Age of Onset  . Stroke Father   . Alzheimer's disease Father   . High blood pressure Father   . Heart Problems Mother       Social History Reviewed with no changes to be made today.   Outpatient Medications Prior to Visit  Medication Sig Dispense Refill  . aspirin 325 MG EC tablet Take 1 tablet (325 mg total) by mouth daily. 90 tablet 3  . atorvastatin (LIPITOR) 40 MG tablet Take 1 tablet (40 mg total) by mouth at bedtime. 90 tablet 3  . carvedilol (COREG) 3.125 MG tablet Take 1 tablet (3.125 mg total) by mouth 2 (two) times daily with a meal. 180 tablet 3  . clopidogrel (PLAVIX) 75 MG tablet Take 1 tablet (75 mg total) by mouth daily. 90 tablet 3  . fish oil-omega-3 fatty acids 1000 MG capsule Take 1 g by mouth 2 (two) times daily.     . meclizine (ANTIVERT) 25 MG tablet Take 1 tablet (25 mg total) by mouth 3 (three) times daily as needed for dizziness. 16 tablet 1  . ranitidine (ZANTAC) 150 MG tablet Take 1 tablet (150 mg total) by mouth 2 (  two) times daily. 180 tablet 0  . tamsulosin (FLOMAX) 0.4 MG CAPS capsule Take 1 capsule (0.4 mg total) by mouth daily. 90 capsule 3  . nitroGLYCERIN (NITROSTAT) 0.4 MG SL tablet Place 1 tablet (0.4 mg total) under the tongue every 5 (five) minutes as needed for chest pain. (Patient not taking: Reported on 02/17/2018) 20 tablet 3  . nicotine polacrilex (NICORETTE) 4 MG gum Take 1 each (4 mg total) by mouth as needed for smoking cessation. (Patient not taking: Reported on 04/09/2018) 100 tablet 1   No facility-administered medications prior to visit.     No Known Allergies     Objective:    BP 128/75 (BP Location: Left Arm, Patient Position: Sitting, Cuff Size: Large)   Pulse (!) 52   Temp 98 F (36.7 C) (Oral)   Ht 5\' 7"  (1.702 m)   Wt 205 lb 6.4 oz (93.2 kg)   SpO2 98%   BMI 32.17 kg/m  Wt Readings from Last 3 Encounters:  05/20/18 205 lb 6.4 oz (93.2 kg)  04/09/18 206 lb 9.6 oz (93.7 kg)  02/17/18 206 lb (93.4 kg)    Physical Exam  Constitutional: He is oriented to person, place, and time. He appears well-developed and well-nourished. He is cooperative.   HENT:  Head: Normocephalic and atraumatic.  Eyes: EOM are normal.  Neck: Normal range of motion.  Cardiovascular: Regular rhythm, normal heart sounds and intact distal pulses. Bradycardia present. Exam reveals no gallop and no friction rub.  No murmur heard. Pulses:      Dorsalis pedis pulses are 2+ on the right side, and 2+ on the left side.       Posterior tibial pulses are 2+ on the right side, and 2+ on the left side.  Pulmonary/Chest: Effort normal and breath sounds normal. No tachypnea. No respiratory distress. He has no decreased breath sounds. He has no wheezes. He has no rhonchi. He has no rales. He exhibits no tenderness.  Abdominal: Soft. Bowel sounds are normal.  Musculoskeletal: Normal range of motion. He exhibits no edema.  Neurological: He is alert and oriented to person, place, and time. He has normal strength. He displays a negative Romberg sign. Coordination and gait normal.  Skin: Skin is warm and dry.  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 His behavior is normal. Judgment and thought content normal.  Nursing note and vitals reviewed.        Patient has been counseled extensively about nutrition and exercise as well as the importance of adherence with medications and regular follow-up. The patient was given clear instructions to go to ER or return to medical center if symptoms don't improve, worsen or new problems develop. The patient verbalized understanding.   Follow-up: Return in about 3 months (around 08/19/2018) for HTN.   Gildardo Pounds, FNP-BC [facility] [place], [place]   05/22/2018, 2:53 PM

## 2018-05-21 LAB — CBC
Hematocrit: 44.5 % (ref 37.5–51.0)
Hemoglobin: 15.2 g/dL (ref 13.0–17.7)
MCH: 31.5 pg (ref 26.6–33.0)
MCHC: 34.2 g/dL (ref 31.5–35.7)
MCV: 92 fL (ref 79–97)
PLATELETS: 143 10*3/uL — AB (ref 150–450)
RBC: 4.82 x10E6/uL (ref 4.14–5.80)
RDW: 13.2 % (ref 12.3–15.4)
WBC: 5.9 10*3/uL (ref 3.4–10.8)

## 2018-05-22 ENCOUNTER — Encounter: Payer: Self-pay | Admitting: Nurse Practitioner

## 2018-05-25 ENCOUNTER — Telehealth: Payer: Self-pay | Admitting: Nurse Practitioner

## 2018-05-25 ENCOUNTER — Telehealth: Payer: Self-pay

## 2018-05-25 NOTE — Telephone Encounter (Signed)
Patient called back to get his lab results. Please follow up with patient.

## 2018-05-25 NOTE — Telephone Encounter (Signed)
CMA attempt to call patient to inform on results.  No answer and unable to leave a VM due to no mailbox has been set up.  If patient call back, please inform:  Platelet levels are currently stable. Will continue to monitor.

## 2018-05-25 NOTE — Telephone Encounter (Signed)
-----   Message from Gildardo Pounds, NP sent at 05/22/2018 11:46 PM EDT ----- Platelet levels are currently stable. Will continue to monitor.

## 2018-05-25 NOTE — Telephone Encounter (Signed)
CMA spoke to patient. Patient was inform on results.

## 2018-06-10 MED FILL — TAMSULOSIN HCL 0.4 MG CAP: 0.4 | 90 days supply | Qty: 90 | Fill #2

## 2018-06-15 MED FILL — CARVEDILOL 3.125 MG TABLET: 3.125 | 90 days supply | Qty: 180 | Fill #2

## 2018-06-15 MED FILL — ATORVASTATIN CALCIUM 40 MG: 40 | 90 days supply | Qty: 90 | Fill #2

## 2018-06-16 MED FILL — OMEGA-3 ETHYL ESTERS 1 GM C: 1 | 90 days supply | Qty: 180 | Fill #1

## 2018-06-30 ENCOUNTER — Encounter: Payer: Self-pay | Admitting: Cardiology

## 2018-06-30 ENCOUNTER — Ambulatory Visit (INDEPENDENT_AMBULATORY_CARE_PROVIDER_SITE_OTHER): Payer: Medicare HMO | Admitting: Cardiology

## 2018-06-30 VITALS — BP 110/62 | HR 62 | Ht 67.0 in | Wt 196.0 lb

## 2018-06-30 DIAGNOSIS — R42 Dizziness and giddiness: Secondary | ICD-10-CM

## 2018-06-30 DIAGNOSIS — I2510 Atherosclerotic heart disease of native coronary artery without angina pectoris: Secondary | ICD-10-CM

## 2018-06-30 DIAGNOSIS — Z716 Tobacco abuse counseling: Secondary | ICD-10-CM

## 2018-06-30 DIAGNOSIS — Z7189 Other specified counseling: Secondary | ICD-10-CM

## 2018-06-30 DIAGNOSIS — F17290 Nicotine dependence, other tobacco product, uncomplicated: Secondary | ICD-10-CM | POA: Diagnosis not present

## 2018-06-30 NOTE — Progress Notes (Addendum)
Cardiology Office Note:    Date:  06/30/2018   ID:  Samuel Lawson, DOB Jan 11, 1946, MRN [id number]  PCP:  Gildardo Pounds, NP  Cardiologist:  Buford Dresser, MD PhD  Referring MD: Gildardo Pounds, NP   CC: vertigo  History of Present Illness:    Samuel Lawson is a 72 y.o. male with a hx of hypertension, coronary artery disease, hyperlipidemia who is seen as a new consult at the request of Gildardo Pounds, NP for the evaluation and management of disequilibrium.  Patient concerns: feels like he wobbles when he walks sometimes. When he turns his head, feels like things are moving. These episodes are not always consistent or predictable. He went to eye doctor, was told he needs glasses. He went to ENT, told it was vertigo. Meclizine helps slightly but not completely. No amaurosis fugax. No syncope. No palpitations. The episodes of vertigo and instability come and go, not consistent on frequency or duration. No clear aggravating/alleviating factors, though he does note that when he turns his head or change position it sometimes triggers the symptoms.   Cardiac history: Has had two stents placed in the past, about 10-15 years ago. Had chest pain with exertion at the time of his initial stent, happened again a month later and had another stent placed near the initial stent. Has had no chest pain since.  ROS: Gets some mild shortness of breath when working in the yard, but no PND or orthopnea. No LE edema. No claudication but does get leg cramps in the bed on occasion.   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 Watching his diet, no fast food/fried food, eats a lot of vegetables. Working on increasing activity.  No alcohol. Smoking 1 ppd, wants to quit. Has gum, plans to quit. Has quit twice in the past, feels confident that he can do it again.  FH: Father had a history of stroke.   Past Medical History:  Diagnosis Date  . 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 Collagen vascular disease ([place])   . Diabetes mellitus without complication ([place])   . Hyperlipidemia   . Hypertension     Past Surgical History:  Procedure Laterality Date  . ANGIOPLASTY    . CARDIAC CATHETERIZATION      Current Medications: Current Outpatient Medications on File Prior to Visit  Medication Sig  . aspirin 325 MG EC tablet Take 1 tablet (325 mg total) by mouth daily.  Marland Kitchen atorvastatin (LIPITOR) 40 MG tablet Take 1 tablet (40 mg total) by mouth at bedtime.  . carvedilol (COREG) 3.125 MG tablet Take 1 tablet (3.125 mg total) by mouth 2 (two) times daily with a meal.  . clopidogrel (PLAVIX) 75 MG tablet Take 1 tablet (75 mg total) by mouth daily.  . meclizine (ANTIVERT) 25 MG tablet Take 1 tablet (25 mg total) by mouth 3 (three) times daily as needed for dizziness.  . Misc. Devices MISC Please provide patient with insurance approved custom compression socks  . nitroGLYCERIN (NITROSTAT) 0.4 MG SL tablet Place 1 tablet (0.4 mg total) under the tongue every 5 (five) minutes as needed for chest pain.  Marland Kitchen omega-3 acid ethyl esters (LOVAZA) 1 g capsule Take 1 capsule (1 g total) by mouth 2 (two) times daily.  . tamsulosin (FLOMAX) 0.4 MG CAPS capsule Take 1 capsule (0.4 mg total) by mouth daily.  . [DISCONTINUED] albuterol (PROVENTIL HFA;VENTOLIN HFA) 108 (90 BASE) MCG/ACT inhaler  Inhale 1-2 puffs into the lungs every 6 (six) hours as needed for wheezing or shortness of breath. Or persistent cough (Patient not taking: Reported on 12/14/2014)  . [DISCONTINUED] promethazine (PHENERGAN) 25 MG tablet Take 1 tablet (25 mg total) by mouth every 6 (six) hours as needed for nausea.   No current facility-administered medications on file prior to visit.      Allergies:   Patient has no known allergies.   Social History   Socioeconomic History  . Marital status: Single    Spouse name: Not on file  . Number of children: 2  . Years of education: B.S.  . Highest education  level: Not on file  Occupational History    Employer: SELF EMPLOYED    Comment: Computers - Works for [employer]  . Financial resource strain: Not hard at all  . Food insecurity:    Worry: Never true    Inability: Never true  . Transportation needs:    Medical: No    Non-medical: No  Tobacco Use  . Smoking status: Current Every Day Smoker    Packs/day: 1.00    Types: Cigarettes  . Smokeless tobacco: Never Used  . Tobacco comment: states new years resolution is to quit smoking  Substance and Sexual Activity  . Alcohol use: No  . Drug use: No  . Sexual activity: Not on file  Lifestyle  . Physical activity:    Days per week: 0 days    Minutes per session: 0 min  . Stress: Not at all  Relationships  . Social connections:    Talks on phone: More than three times a week    Gets together: More than three times a week    Attends religious service: Never    Active member of club or organization: No    Attends meetings of clubs or organizations: Never    Relationship status: Divorced  Other Topics Concern  . Not on file  Social History Narrative   Patient lives at home alone and he is single. Reports being married in the past. Patient is self employed and works with computers.   Education B.S.   Right handed.   Caffeine one cup of tea daily and two pepsi daily.     Family History: The patient's family history includes Alzheimer's disease in his father; Heart Problems in his mother; High blood pressure in his father; Stroke in his father.  ROS:   Please see the history of present illness.  Additional pertinent ROS:  Constitutional: Negative for chills, fever, night sweats, unintentional weight loss  HENT: Negative for ear pain and hearing loss.   Eyes: Negative for loss of vision and eye pain.  Respiratory: Negative for cough, sputum, shortness of breath, wheezing.   Cardiovascular: Negative for chest pain, palpitations, PND, orthopnea, lower extremity edema and  claudication.  Gastrointestinal: Negative for abdominal pain, melena, and hematochezia.  Genitourinary: Negative for dysuria and hematuria.  Musculoskeletal: Negative for falls and myalgias.  Skin: Negative for itching and rash.  Neurological: Negative for focal weakness, focal sensory changes and loss of consciousness. Positive for vertigo Endo/Heme/Allergies: Does not bruise/bleed easily.    EKGs/Labs/Other Studies Reviewed:    The following studies were reviewed today: Echo 07/03/15 Study Conclusions  - Left ventricle: The cavity size was normal. Wall thickness was   increased in a pattern of moderate LVH. Systolic function was   normal. The estimated ejection fraction was in the range of 60%   to 65%.  Wall motion was normal; there were no regional wall   motion abnormalities. Left ventricular diastolic function   parameters were normal. - Left atrium: The atrium was normal in size. - Inferior vena cava: The vessel was normal in size. The   respirophasic diameter changes were in the normal range (>= 50%),   consistent with normal central venous pressure. - Pericardium, extracardiac: There was no pericardial effusion.  Impressions:  - Compared to a prior echo in 2014, there have been no significant   changes.  MRA 04/12/17 MRA NECK FINDINGS  Precontrast time-of-flight images reveal antegrade flow in both carotid and vertebral arteries in the neck. The left vertebral appears dominant.  Post-contrast neck MRA images re- demonstrate a bovine type arch configuration there is some artifact at the proximal right CCA level, but otherwise the great vessel origins appear stable and within normal limits as seen on the 2016 CTA.  No cervical carotid stenosis. Both cervical ICAs appear normal for age with mild tortuosity.  Dominant left vertebral artery. As before the proximal right vertebral artery is not well visualized. The vertebral arteries appears stable since 2014.  The right side functionally terminates in PICA.  MRA HEAD FINDINGS  Antegrade flow in the posterior circulation with dominant distal left vertebral artery. Patent PICA origins. No distal vertebral stenosis. Patent vertebrobasilar junction. Normal SCA and PCA origins. Tortuous right P1. Fetal type left PCA origin again noted. Bilateral PCA branches are stable and within normal limits. Diminutive right posterior communicating artery.  Antegrade flow in both ICA siphons appears stable since 2014. The left siphon appears dominant. No siphon stenosis. Ophthalmic and posterior communicating artery origins are within normal limits. Patent carotid termini. Normal MCA and ACA origins. Visible bilateral MCA and ACA branches are stable since 2014 and within normal limits.  EKG:  EKG is ordered today.  The ekg ordered today demonstrates normal sinus rhythm with 1st degree AV block, single PVC, trivial inferior Q waves.  Recent Labs: 11/12/2017: ALT 11; BUN 19; Creatinine, Ser 1.13; Potassium 4.4; Sodium 143 05/20/2018: Hemoglobin 15.2; Platelets 143  Recent Lipid Panel    Component Value Date/Time   CHOL 127 11/12/2017 0915   TRIG 143 11/12/2017 0915   HDL 47 11/12/2017 0915   CHOLHDL 2.7 11/12/2017 0915   CHOLHDL 3.3 01/22/2016 1655   VLDL 49 (H) 01/22/2016 1655   LDLCALC 51 11/12/2017 0915    Physical Exam:    VS:  BP 110/62   Pulse 62   Ht 5\' 7"  (1.702 m)   Wt 196 lb (88.9 kg)   SpO2 98%   BMI 30.70 kg/m     Wt Readings from Last 3 Encounters:  06/30/18 196 lb (88.9 kg)  05/20/18 205 lb 6.4 oz (93.2 kg)  04/09/18 206 lb 9.6 oz (93.7 kg)     GEN: Well nourished, well developed in no acute distress HEENT: Normal NECK: No JVD; No carotid bruits LYMPHATICS: No lymphadenopathy CARDIAC: regular rhythm, normal S1 and S2, no murmurs, rubs, gallops. Radial and DP pulses 2+ bilaterally. RESPIRATORY:  Clear to auscultation without rales, wheezing or rhonchi  ABDOMEN: Soft,  non-tender, non-distended MUSCULOSKELETAL:  No edema; No deformity  SKIN: Warm and dry NEUROLOGIC:  Alert and oriented x 3 PSYCHIATRIC:  Normal affect   ASSESSMENT:    1. Coronary artery disease involving native coronary artery of native heart without angina pectoris   2. Vertigo   3. Tobacco abuse counseling   4. Other tobacco product nicotine dependence, uncomplicated  5. Counseling on health promotion and disease prevention    PLAN:    1. Disequilibrium/vertigo: no presyncopal symptoms, no chest pain, no carotid bruits, no amaurosis fugax. Prior MRA last year showed patent vasculature. Unlikely that this is cardiac/carotid in nature. Agree with ENT workup for inner ear disturbance. ?BPPV given occasional positional nature, but not always related to this.  2. History of CAD s/p stents: asymptomatic. Needs better secondary prevention -recommend heart healthy/Mediterranean diet, with whole grains, fruits, vegetable, fish, lean meats, nuts, and olive oil. Limit salt. -recommend moderate walking, 3-5 times/week for 30-50 minutes each session. Aim for at least 150 minutes.week. Goal should be pace of 3 miles/hours, or walking 1.5 miles in 30 minutes -recommend avoidance of tobacco products. The patient was counseled on the dangers of tobacco use, and was advised to quit.  Reviewed strategies to maximize success, including removing cigarettes and smoking materials from environment, stress management, substitution of other forms of reinforcement and support of family/friends. Tobacco cessation counseling 7 minutes. He is motivated to quit and plans to attempt quitting within the week. -Additional risk factor control:  -Diabetes risk: A1c is 5.6  -Lipids: LDL 51, at goal. On atorvastatin 40 mg and lovaza.  -Blood pressure control: at goal on carvedilol  -Weight: BMI 30. Predominantly abdominal adiposity. Lifestyle as above  -aspirin: on 325 mg aspirin, unclear indication for this higher dose.  From a cardiac perspective, recommend decreasing to 81 mg aspirin daily. Also on clopidogrel 75 mg daily. Unclear indication for this given length of time from his stent, especially while on 325 mg aspirin. Would stop the clopidogrel as well.   Plan for follow up: 1 year or sooner PRN.  Medication Adjustments/Labs and Tests Ordered: Current medicines are reviewed at length with the patient today.  Concerns regarding medicines are outlined above.  Orders Placed This Encounter  Procedures  . EKG 12-Lead   No orders of the defined types were placed in this encounter.   Patient Instructions  Medication Instructions:  Your Physician recommend you continue on your current medication as directed.    If you need a refill on your cardiac medications before your next appointment, please call your pharmacy.   Lab work: None   Testing/Procedures: None  Follow-Up: At [facility], you and your health needs are our priority.  As part of our continuing mission to provide you with exceptional heart care, we have created designated Provider Care Teams.  These Care Teams include your primary Cardiologist (physician) and Advanced Practice Providers (APPs -  Physician Assistants and Nurse Practitioners) who all work together to provide you with the care you need, when you need it. You will need a follow up appointment in 1 years.  Please call our office 2 months in advance to schedule this appointment.  You may see Dr. Harrell Gave or one of the following Advanced Practice Providers on your designated Care Team:   Rosaria Ferries, PA-C . Jory Sims, DNP, ANP  Any Other Special Instructions Will Be Listed Below (If Applicable).       Signed, Buford Dresser, MD PhD 06/30/2018 9:03 AM    [place]

## 2018-06-30 NOTE — Patient Instructions (Signed)
Medication Instructions:  Your Physician recommend you continue on your current medication as directed.    If you need a refill on your cardiac medications before your next appointment, please call your pharmacy.   Lab work: None  Testing/Procedures: None  Follow-Up: At [facility], you and your health needs are our priority.  As part of our continuing mission to provide you with exceptional heart care, we have created designated Provider Care Teams.  These Care Teams include your primary Cardiologist (physician) and Advanced Practice Providers (APPs -  Physician Assistants and Nurse Practitioners) who all work together to provide you with the care you need, when you need it. You will need a follow up appointment in 1 years.  Please call our office 2 months in advance to schedule this appointment.  You may see Dr. Christopher or one of the following Advanced Practice Providers on your designated Care Team:   Rhonda Barrett, PA-C . Kathryn Lawrence, DNP, ANP  Any Other Special Instructions Will Be Listed Below (If Applicable).    

## 2018-07-01 ENCOUNTER — Encounter: Payer: Self-pay | Admitting: Cardiology

## 2018-07-02 ENCOUNTER — Telehealth: Payer: Self-pay

## 2018-07-02 DIAGNOSIS — I2510 Atherosclerotic heart disease of native coronary artery without angina pectoris: Secondary | ICD-10-CM

## 2018-07-02 MED ORDER — ASPIRIN 81 MG PO TBEC: 325.0000 mg | DELAYED_RELEASE_TABLET | Freq: Every day | ORAL | Status: DC

## 2018-07-02 NOTE — Telephone Encounter (Signed)
Spoke with pt and advised to stop Plavix and decrease ASA to 81 mg daily per Dr. Harrell Gave. Pt verbalized understanding.

## 2018-07-20 ENCOUNTER — Ambulatory Visit: Payer: Medicare HMO | Admitting: Nurse Practitioner

## 2018-07-20 MED FILL — CLOPIDOGREL 75 MG TABLET: 75 | 90 days supply | Qty: 90 | Fill #2

## 2018-07-24 ENCOUNTER — Ambulatory Visit: Payer: Medicare HMO | Admitting: Nurse Practitioner

## 2018-07-24 MED FILL — raNITIdine HCL 150 MG TABS: 150 | 30 days supply | Qty: 60 | Fill #0

## 2018-08-24 ENCOUNTER — Telehealth: Payer: Self-pay

## 2018-08-24 NOTE — Telephone Encounter (Signed)
Call was forwarded to me by Raynelle Fanning at the front desk. The patient was frustrated that is has taken too long to fill his Ranitidine 150mg . I informed him of the national shortage and apologized that we had not notified him earlier. I told him we can speak with the provider about a change to a product that is available. He was agreeable to change and I will notify him of the replacement product.   Zelda: can you please send an acceptable alternative to the [facility] for this patient?

## 2018-08-25 ENCOUNTER — Other Ambulatory Visit: Payer: Self-pay | Admitting: Nurse Practitioner

## 2018-08-25 MED ORDER — FAMOTIDINE 20 MG PO TABS: 20.0000 mg | ORAL_TABLET | Freq: Two times a day (BID) | ORAL | 1 refills | Status: DC

## 2018-08-25 NOTE — Telephone Encounter (Signed)
pepcid has been sent

## 2018-08-25 NOTE — Telephone Encounter (Signed)
Patient notified of change and has picked up RX from the pharmacy.

## 2018-09-10 MED FILL — OMEGA-3 ETHYL ESTERS 1 GM C: 1 | 90 days supply | Qty: 180 | Fill #2

## 2018-09-10 MED FILL — TAMSULOSIN HCL 0.4 MG CAP: 0.4 | 90 days supply | Qty: 90 | Fill #3

## 2018-09-14 MED FILL — CARVEDILOL 3.125 MG TABLET: 3.125 | 90 days supply | Qty: 180 | Fill #3

## 2018-09-14 MED FILL — ATORVASTATIN CALCIUM 40 MG: 40 | 90 days supply | Qty: 90 | Fill #3

## 2018-11-23 ENCOUNTER — Other Ambulatory Visit: Payer: Self-pay | Admitting: Nurse Practitioner

## 2018-11-23 MED FILL — OMEGA-3 ETHYL ESTERS 1 GM C: 1 | 60 days supply | Qty: 120 | Fill #3

## 2018-11-30 MED FILL — CARVEDILOL 3.125 MG TABLET: 3.125 | 30 days supply | Qty: 60 | Fill #0

## 2018-11-30 MED FILL — ATORVASTATIN CALCIUM 40 MG: 40 | 90 days supply | Qty: 90 | Fill #0

## 2018-11-30 MED FILL — CLOPIDOGREL 75 MG TABLET: 75 | 30 days supply | Qty: 30 | Fill #0

## 2018-11-30 MED FILL — TAMSULOSIN HCL 0.4 MG CAP: 0.4 | 30 days supply | Qty: 30 | Fill #0

## 2018-12-02 ENCOUNTER — Ambulatory Visit: Payer: Medicare HMO | Attending: Nurse Practitioner | Admitting: Nurse Practitioner

## 2018-12-02 ENCOUNTER — Other Ambulatory Visit: Payer: Self-pay

## 2018-12-02 DIAGNOSIS — I10 Essential (primary) hypertension: Secondary | ICD-10-CM

## 2018-12-02 DIAGNOSIS — I2510 Atherosclerotic heart disease of native coronary artery without angina pectoris: Secondary | ICD-10-CM

## 2018-12-02 DIAGNOSIS — Z760 Encounter for issue of repeat prescription: Secondary | ICD-10-CM

## 2018-12-02 DIAGNOSIS — N400 Enlarged prostate without lower urinary tract symptoms: Secondary | ICD-10-CM

## 2018-12-02 MED ORDER — ASPIRIN 81 MG PO TBEC: 81.0000 mg | DELAYED_RELEASE_TABLET | Freq: Every day | ORAL | 1 refills | Status: AC

## 2018-12-02 MED ORDER — TAMSULOSIN HCL 0.4 MG PO CAPS: 0.4000 mg | ORAL_CAPSULE | Freq: Every day | ORAL | 3 refills | Status: DC

## 2018-12-02 MED ORDER — CARVEDILOL 3.125 MG PO TABS: 3.1250 mg | ORAL_TABLET | Freq: Two times a day (BID) | ORAL | 1 refills | Status: DC

## 2018-12-02 NOTE — Progress Notes (Signed)
TELE HEALTH CALL TO Samuel Lawson  He is requesting medication refills today specifically for his plavix which was discontinued per review of cardiology office note from 06-2018. He states when he stopped taking Plavix he did not feel good so he started taking half a tablet but did not let cardiology know that he had resumed his plavix. I have instructed him to contact the cardiology office (I even gave him the office number) and I have instructed him to stop taking Plavix and at this time he is to only take ASA 81 mg as instructed by Cardiology previously. He verbalized understanding.  He denies any chest pain, shortness of breath, dizziness, palpitations or BLE edema. States doing well overall.   Tobacco Dependence Still smoking. States down to 1pack of cigarettes lasting 2-3 days. Goal is to quit smoking within the next few months. He declines any smoking cessation treatment today.   BPH  Symptoms well controlled with Flomax 0.1mg  daily. Denies any complications or adverse side effects.    Diagnoses and all orders for this visit:  Medication refill  Essential hypertension -     carvedilol (COREG) 3.125 MG tablet; Take 1 tablet (3.125 mg total) by mouth 2 (two) times daily with a meal. Continue all antihypertensives as prescribed.  Remember to bring in your blood pressure log with you for your follow up appointment.  DASH/Mediterranean Diets are healthier choices for HTN.    Coronary artery disease involving native coronary artery of native heart without angina pectoris -     carvedilol (COREG) 3.125 MG tablet; Take 1 tablet (3.125 mg total) by mouth 2 (two) times daily with a meal. -     aspirin 81 MG EC tablet; Take 1 tablet (81 mg total) by mouth daily.  Benign prostatic hyperplasia, unspecified whether lower urinary tract symptoms present -     tamsulosin (FLOMAX) 0.4 MG CAPS capsule; Take 1 capsule (0.4 mg total) by mouth daily.

## 2019-01-04 MED FILL — TAMSULOSIN HCL 0.4 MG CAP: 0.4 | 90 days supply | Qty: 90 | Fill #1

## 2019-01-04 MED FILL — CARVEDILOL 3.125 MG TABLET: 3.125 | 90 days supply | Qty: 180 | Fill #1

## 2019-01-25 ENCOUNTER — Other Ambulatory Visit: Payer: Self-pay | Admitting: Nurse Practitioner

## 2019-01-25 DIAGNOSIS — E785 Hyperlipidemia, unspecified: Secondary | ICD-10-CM

## 2019-01-25 MED FILL — OMEGA-3 ETHYL ESTERS 1 GM C: 1 | 30 days supply | Qty: 60 | Fill #0

## 2019-02-19 MED FILL — CLOPIDOGREL 75 MG TABLET: 75 | 30 days supply | Qty: 30 | Fill #1

## 2019-03-01 ENCOUNTER — Other Ambulatory Visit: Payer: Self-pay | Admitting: Nurse Practitioner

## 2019-03-01 DIAGNOSIS — E785 Hyperlipidemia, unspecified: Secondary | ICD-10-CM

## 2019-03-05 ENCOUNTER — Other Ambulatory Visit: Payer: Self-pay | Admitting: Nurse Practitioner

## 2019-03-05 DIAGNOSIS — E785 Hyperlipidemia, unspecified: Secondary | ICD-10-CM

## 2019-03-15 ENCOUNTER — Other Ambulatory Visit: Payer: Self-pay | Admitting: Nurse Practitioner

## 2019-03-15 DIAGNOSIS — E785 Hyperlipidemia, unspecified: Secondary | ICD-10-CM

## 2019-03-15 MED FILL — ATORVASTATIN CALCIUM 40 MG: 40 | 90 days supply | Qty: 90 | Fill #1

## 2019-03-16 ENCOUNTER — Emergency Department (HOSPITAL_COMMUNITY)
Admission: EM | Admit: 2019-03-16 | Discharge: 2019-03-16 | Discharge status: Against Medical Advice | Payer: Medicare HMO | Attending: Emergency Medicine | Admitting: Emergency Medicine

## 2019-03-16 ENCOUNTER — Encounter (HOSPITAL_COMMUNITY): Payer: Self-pay | Admitting: Emergency Medicine

## 2019-03-16 ENCOUNTER — Emergency Department (HOSPITAL_COMMUNITY): Payer: Medicare HMO

## 2019-03-16 ENCOUNTER — Other Ambulatory Visit: Payer: Self-pay

## 2019-03-16 DIAGNOSIS — R42 Dizziness and giddiness: Secondary | ICD-10-CM | POA: Diagnosis present

## 2019-03-16 DIAGNOSIS — Z5321 Procedure and treatment not carried out due to patient leaving prior to being seen by health care provider: Secondary | ICD-10-CM | POA: Insufficient documentation

## 2019-03-16 LAB — I-STAT CHEM 8, ED
BUN: 11 mg/dL (ref 8–23)
Calcium, Ion: 1.13 mmol/L — ABNORMAL LOW (ref 1.15–1.40)
Chloride: 107 mmol/L (ref 98–111)
Creatinine, Ser: 1.1 mg/dL (ref 0.61–1.24)
Glucose, Bld: 96 mg/dL (ref 70–99)
HCT: 42 % (ref 39.0–52.0)
Hemoglobin: 14.3 g/dL (ref 13.0–17.0)
Potassium: 3.8 mmol/L (ref 3.5–5.1)
Sodium: 141 mmol/L (ref 135–145)
TCO2: 25 mmol/L (ref 22–32)

## 2019-03-16 LAB — COMPREHENSIVE METABOLIC PANEL
ALT: 14 U/L (ref 0–44)
AST: 16 U/L (ref 15–41)
Albumin: 3.8 g/dL (ref 3.5–5.0)
Alkaline Phosphatase: 57 U/L (ref 38–126)
Anion gap: 5 (ref 5–15)
BUN: 10 mg/dL (ref 8–23)
CO2: 26 mmol/L (ref 22–32)
Calcium: 8.7 mg/dL — ABNORMAL LOW (ref 8.9–10.3)
Chloride: 110 mmol/L (ref 98–111)
Creatinine, Ser: 1.1 mg/dL (ref 0.61–1.24)
GFR calc Af Amer: >60 mL/min (ref >60)
GFR calc non Af Amer: >60 mL/min (ref >60)
Glucose, Bld: 100 mg/dL — ABNORMAL HIGH (ref 70–99)
Potassium: 3.9 mmol/L (ref 3.5–5.1)
Sodium: 141 mmol/L (ref 135–145)
Total Bilirubin: 1 mg/dL (ref 0.3–1.2)
Total Protein: 6.6 g/dL (ref 6.5–8.1)

## 2019-03-16 LAB — DIFFERENTIAL
Abs Immature Granulocytes: 0.02 10*3/uL (ref 0.00–0.07)
Basophils Absolute: 0 10*3/uL (ref 0.0–0.1)
Basophils Relative: 0 %
Eosinophils Absolute: 0.2 10*3/uL (ref 0.0–0.5)
Eosinophils Relative: 3 %
Immature Granulocytes: 0 %
Lymphocytes Relative: 17 %
Lymphs Abs: 1.2 10*3/uL (ref 0.7–4.0)
Monocytes Absolute: 0.6 10*3/uL (ref 0.1–1.0)
Monocytes Relative: 9 %
Neutro Abs: 4.9 10*3/uL (ref 1.7–7.7)
Neutrophils Relative %: 71 %

## 2019-03-16 LAB — CBC
HCT: 43.9 % (ref 39.0–52.0)
Hemoglobin: 14.2 g/dL (ref 13.0–17.0)
MCH: 31.3 pg (ref 26.0–34.0)
MCHC: 32.3 g/dL (ref 30.0–36.0)
MCV: 96.7 fL (ref 80.0–100.0)
Platelets: 118 10*3/uL — ABNORMAL LOW (ref 150–400)
RBC: 4.54 MIL/uL (ref 4.22–5.81)
RDW: 12.8 % (ref 11.5–15.5)
WBC: 6.9 10*3/uL (ref 4.0–10.5)
nRBC: 0 % (ref 0.0–0.2)

## 2019-03-16 LAB — PROTIME-INR
INR: 1 (ref 0.8–1.2)
Prothrombin Time: 13.5 seconds (ref 11.4–15.2)

## 2019-03-16 LAB — APTT: aPTT: 32 seconds (ref 24–36)

## 2019-03-16 MED ORDER — SODIUM CHLORIDE 0.9% FLUSH: 3.0000 mL | Freq: Once | INTRAVENOUS | Status: DC

## 2019-03-16 NOTE — ED Notes (Signed)
Dr. Francia Greaves present in triage.

## 2019-03-16 NOTE — ED Notes (Signed)
Pt had several hours ago that he was thinking of leaving and wanted his IV removed, encouraged him to stay, said he would decide. Registration has been calling pt, and he has been called several times in the lobby without response.

## 2019-03-16 NOTE — ED Triage Notes (Addendum)
Pt BIB [facility], reports sudden onset dizziness, worse with movement, and nausea and vomiting. LSN 1630. Hx vertigo. Given 4mg  zofran IV PTA. EMS VS: BP 125/72, HR 58, SpO2 97% room air.

## 2019-03-23 ENCOUNTER — Other Ambulatory Visit: Payer: Self-pay

## 2019-03-23 ENCOUNTER — Encounter: Payer: Self-pay | Admitting: Family Medicine

## 2019-03-23 ENCOUNTER — Ambulatory Visit: Payer: Medicare HMO | Attending: Family Medicine | Admitting: Family Medicine

## 2019-03-23 VITALS — BP 90/58 | HR 55 | Ht 67.0 in | Wt 197.0 lb

## 2019-03-23 DIAGNOSIS — H6121 Impacted cerumen, right ear: Secondary | ICD-10-CM | POA: Diagnosis not present

## 2019-03-23 DIAGNOSIS — I2510 Atherosclerotic heart disease of native coronary artery without angina pectoris: Secondary | ICD-10-CM

## 2019-03-23 DIAGNOSIS — N400 Enlarged prostate without lower urinary tract symptoms: Secondary | ICD-10-CM

## 2019-03-23 DIAGNOSIS — E785 Hyperlipidemia, unspecified: Secondary | ICD-10-CM

## 2019-03-23 DIAGNOSIS — I10 Essential (primary) hypertension: Secondary | ICD-10-CM

## 2019-03-23 DIAGNOSIS — R42 Dizziness and giddiness: Secondary | ICD-10-CM

## 2019-03-23 MED ORDER — FAMOTIDINE 20 MG PO TABS: 20.0000 mg | ORAL_TABLET | Freq: Two times a day (BID) | ORAL | 1 refills | Status: DC

## 2019-03-23 MED ORDER — ATORVASTATIN CALCIUM 40 MG PO TABS: 40.0000 mg | ORAL_TABLET | Freq: Every day | ORAL | 1 refills | Status: DC

## 2019-03-23 MED ORDER — OMEGA-3-ACID ETHYL ESTERS 1 G PO CAPS: 1.0000 g | ORAL_CAPSULE | Freq: Two times a day (BID) | ORAL | 1 refills | Status: DC

## 2019-03-23 MED ORDER — CARVEDILOL 3.125 MG PO TABS: 3.1250 mg | ORAL_TABLET | Freq: Two times a day (BID) | ORAL | 1 refills | Status: DC

## 2019-03-23 MED ORDER — TAMSULOSIN HCL 0.4 MG PO CAPS: 0.4000 mg | ORAL_CAPSULE | Freq: Every day | ORAL | 1 refills | Status: DC

## 2019-03-23 MED ORDER — DEBROX 6.5 % OT SOLN: 5.0000 [drp] | Freq: Two times a day (BID) | OTIC | 2 refills | Status: DC

## 2019-03-23 MED ORDER — MECLIZINE HCL 25 MG PO TABS: 25.0000 mg | ORAL_TABLET | Freq: Three times a day (TID) | ORAL | 1 refills | Status: DC | PRN

## 2019-03-23 MED FILL — MECLIZINE 25 MG TABLET: 25 | 20 days supply | Qty: 60 | Fill #0

## 2019-03-23 MED FILL — OMEGA-3 ETHYL ESTERS 1 GM C: 1 | 30 days supply | Qty: 60 | Fill #0

## 2019-03-23 NOTE — Progress Notes (Signed)
Patient needs refills on medications.

## 2019-03-23 NOTE — Progress Notes (Signed)
Subjective:  Patient ID: Samuel Lawson, male    DOB: Jan 11, 1946  Age: 73 y.o. MRN: [id number]  CC: Hospitalization Follow-up   HPI Samuel Lawson is a 73 year old male with a history of hypertension, coronary artery disease, hyperlipidemia who presents today for follow-up visit and refill of medications. He had an ED presentation 1 week ago after presenting with dizziness.  Labs were normal except for slight thrombocytopenia of 118 and CT scan of the head was unremarkable. Due to the high volume in the ED he did not wait for full evaluation; no physician notes available in the ED for review.  He endorses dizziness on turning his head and on changing positions to the point that he has to hold onto support.  Denies syncope, chest pains or dyspnea.  Symptoms are absent at this time.  He denies blurry vision or headaches.  He is needing refill of his chronic medications. Past Medical History:  Diagnosis Date  . 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 Collagen vascular disease ([place])   . Diabetes mellitus without complication ([place])   . Hyperlipidemia   . Hypertension     Past Surgical History:  Procedure Laterality Date  . ANGIOPLASTY    . CARDIAC CATHETERIZATION      Family History  Problem Relation Age of Onset  . Stroke Father   . Alzheimer's disease Father   . High blood pressure Father   . Heart Problems Mother     No Known Allergies  Outpatient Medications Prior to Visit  Medication Sig Dispense Refill  . clopidogrel (PLAVIX) 75 MG tablet     . famotidine (PEPCID) 20 MG tablet TAKE ONE TABLET BY MOUTH TWICE DAILY 180 tablet 0  . meclizine (ANTIVERT) 25 MG tablet Take 1 tablet (25 mg total) by mouth 3 (three) times daily as needed for dizziness. 16 tablet 1  . omega-3 acid ethyl esters (LOVAZA) 1 g capsule Take 1 capsule (1 g total) by mouth 2 (two) times daily. MUST MAKE APPT FOR FURTHER REFILLS 60 capsule 0  . tamsulosin (FLOMAX) 0.4 MG CAPS capsule Take 1 capsule  (0.4 mg total) by mouth daily. 90 capsule 3  . atorvastatin (LIPITOR) 40 MG tablet Take 1 tablet (40 mg total) by mouth at bedtime. 90 tablet 3  . carvedilol (COREG) 3.125 MG tablet Take 1 tablet (3.125 mg total) by mouth 2 (two) times daily with a meal. 180 tablet 1   No facility-administered medications prior to visit.      ROS Review of Systems  Constitutional: Negative for activity change and appetite change.  HENT: Negative for sinus pressure and sore throat.   Eyes: Negative for visual disturbance.  Respiratory: Negative for cough, chest tightness and shortness of breath.   Cardiovascular: Negative for chest pain and leg swelling.  Gastrointestinal: Negative for abdominal distention, abdominal pain, constipation and diarrhea.  Endocrine: Negative.   Genitourinary: Negative for dysuria.  Musculoskeletal: Negative for joint swelling and myalgias.  Skin: Negative for rash.  Allergic/Immunologic: Negative.   Neurological: Positive for light-headedness. Negative for weakness and numbness.  Psychiatric/Behavioral: Negative for dysphoric mood and suicidal ideas.    Objective:  BP (!) 90/58   Pulse (!) 55   Ht 5\' 7"  (1.702 m)   Wt 197 lb (89.4 kg)   SpO2 97%   BMI 30.85 kg/m   BP/Weight 03/23/2019 03/16/2019 30/05/2329  Systolic BP 90 076 226  Diastolic BP 58 60 62  Wt. (Lbs) 197 - 196  BMI  30.85 - 30.7      Physical Exam Constitutional:      Appearance: He is well-developed.  HENT:     Head:     Comments: Cerumen obscuring right tympanic membrane Left tympanic membrane is normal Cardiovascular:     Rate and Rhythm: Normal rate.     Heart sounds: Normal heart sounds. No murmur.  Pulmonary:     Effort: Pulmonary effort is normal.     Breath sounds: Normal breath sounds. No wheezing or rales.  Chest:     Chest wall: No tenderness.  Abdominal:     General: Bowel sounds are normal. There is no distension.     Palpations: Abdomen is soft. There is no mass.      Tenderness: There is no abdominal tenderness.  Musculoskeletal: Normal range of motion.  Neurological:     Mental Status: He is alert and oriented to person, place, and time.     CMP Latest Ref Rng & Units 03/16/2019 03/16/2019 11/12/2017  Glucose 70 - 99 mg/dL 96 100(H) 120(H)  BUN 8 - 23 mg/dL 11 10 19   Creatinine 0.61 - 1.24 mg/dL 1.10 1.10 1.13  Sodium 135 - 145 mmol/L 141 141 143  Potassium 3.5 - 5.1 mmol/L 3.8 3.9 4.4  Chloride 98 - 111 mmol/L 107 110 103  CO2 22 - 32 mmol/L - 26 26  Calcium 8.9 - 10.3 mg/dL - 8.7(L) 9.5  Total Protein 6.5 - 8.1 g/dL - 6.6 6.5  Total Bilirubin 0.3 - 1.2 mg/dL - 1.0 0.7  Alkaline Phos 38 - 126 U/L - 57 68  AST 15 - 41 U/L - 16 13  ALT 0 - 44 U/L - 14 11    Lipid Panel     Component Value Date/Time   CHOL 127 11/12/2017 0915   TRIG 143 11/12/2017 0915   HDL 47 11/12/2017 0915   CHOLHDL 2.7 11/12/2017 0915   CHOLHDL 3.3 01/22/2016 1655   VLDL 49 (H) 01/22/2016 1655   LDLCALC 51 11/12/2017 0915    CBC    Component Value Date/Time   WBC 6.9 03/16/2019 1740   RBC 4.54 03/16/2019 1740   HGB 14.3 03/16/2019 1754   HGB 15.2 05/20/2018 1010   HCT 42.0 03/16/2019 1754   HCT 44.5 05/20/2018 1010   PLT 118 (L) 03/16/2019 1740   PLT 143 (L) 05/20/2018 1010   MCV 96.7 03/16/2019 1740   MCV 92 05/20/2018 1010   MCH 31.3 03/16/2019 1740   MCHC 32.3 03/16/2019 1740   RDW 12.8 03/16/2019 1740   RDW 13.2 05/20/2018 1010   LYMPHSABS 1.2 03/16/2019 1740   MONOABS 0.6 03/16/2019 1740   EOSABS 0.2 03/16/2019 1740   BASOSABS 0.0 03/16/2019 1740    Lab Results  Component Value Date   HGBA1C 5.6 11/12/2017    Assessment & Plan:   1. Benign prostatic hyperplasia, unspecified whether lower urinary tract symptoms present Stable - tamsulosin (FLOMAX) 0.4 MG CAPS capsule; Take 1 capsule (0.4 mg total) by mouth daily.  Dispense: 90 capsule; Refill: 1  2. Hyperlipidemia LDL goal <100 Controlled Low-cholesterol diet - omega-3 acid ethyl esters  (LOVAZA) 1 g capsule; Take 1 capsule (1 g total) by mouth 2 (two) times daily.  Dispense: 60 capsule; Refill: 1 - atorvastatin (LIPITOR) 40 MG tablet; Take 1 tablet (40 mg total) by mouth at bedtime.  Dispense: 90 tablet; Refill: 1  3. Essential hypertension Controlled Counseled on blood pressure goal of less than 130/80, low-sodium, DASH diet, medication  compliance, 150 minutes of moderate intensity exercise per week. Discussed medication compliance, adverse effects. - carvedilol (COREG) 3.125 MG tablet; Take 1 tablet (3.125 mg total) by mouth 2 (two) times daily with a meal.  Dispense: 180 tablet; Refill: 1  4. Coronary artery disease involving native coronary artery of native heart without angina pectoris Asymptomatic Risk factor modification - clopidogrel (PLAVIX) 75 MG tablet - carvedilol (COREG) 3.125 MG tablet; Take 1 tablet (3.125 mg total) by mouth 2 (two) times daily with a meal.  Dispense: 180 tablet; Refill: 1  5. Vertigo CT scan unrevealing - meclizine (ANTIVERT) 25 MG tablet; Take 1 tablet (25 mg total) by mouth 3 (three) times daily as needed for dizziness.  Dispense: 60 tablet; Refill: 1  6. Impacted cerumen of right ear Placed on Debrox has cerumen impaction could also account for his vertigo    Meds ordered this encounter  Medications  . tamsulosin (FLOMAX) 0.4 MG CAPS capsule    Sig: Take 1 capsule (0.4 mg total) by mouth daily.    Dispense:  90 capsule    Refill:  1  . omega-3 acid ethyl esters (LOVAZA) 1 g capsule    Sig: Take 1 capsule (1 g total) by mouth 2 (two) times daily.    Dispense:  60 capsule    Refill:  1  . famotidine (PEPCID) 20 MG tablet    Sig: Take 1 tablet (20 mg total) by mouth 2 (two) times daily.    Dispense:  180 tablet    Refill:  1  . atorvastatin (LIPITOR) 40 MG tablet    Sig: Take 1 tablet (40 mg total) by mouth at bedtime.    Dispense:  90 tablet    Refill:  1  . carvedilol (COREG) 3.125 MG tablet    Sig: Take 1 tablet (3.125  mg total) by mouth 2 (two) times daily with a meal.    Dispense:  180 tablet    Refill:  1  . meclizine (ANTIVERT) 25 MG tablet    Sig: Take 1 tablet (25 mg total) by mouth 3 (three) times daily as needed for dizziness.    Dispense:  60 tablet    Refill:  1  . carbamide peroxide (DEBROX) 6.5 % OTIC solution    Sig: Place 5 drops into the right ear 2 (two) times daily.    Dispense:  15 mL    Refill:  2    Follow-up: Return in about 3 months (around 06/23/2019) for medical conditions with PCP.Marland Kitchen       Charlott Rakes, MD, FAAFP. [facility] [place], [place]   03/23/2019, 6:21 PM

## 2019-04-07 MED FILL — CARVEDILOL 3.125 MG TABLET: 3.125 | 90 days supply | Qty: 180 | Fill #2

## 2019-04-07 MED FILL — TAMSULOSIN HCL 0.4 MG CAP: 0.4 | 90 days supply | Qty: 90 | Fill #2

## 2019-04-15 ENCOUNTER — Telehealth: Payer: Self-pay | Admitting: Nurse Practitioner

## 2019-04-15 NOTE — Telephone Encounter (Signed)
New Message   Pt states he wants to make an appt for an eye and ear doctor, says we did for him before. Please f/u

## 2019-04-16 NOTE — Telephone Encounter (Signed)
Jazimine try to get as much information as possible when the patients are calling so there is not a lot of back and forth with the patients. Thanks so much.  Bien please call patient and verify why the referrals are needed. Thanks.

## 2019-04-16 NOTE — Telephone Encounter (Signed)
CMA spoke to patient and gave him the [facility] number to reach them to schedule an appt.  Patient was referred to them back then and was informed he can call them to schedule an appt with them per referral coordinator.   Pt. Understood.

## 2019-04-21 MED FILL — ERYTHROMYCIN EYE OINTMENT: 5 | 7 days supply | Qty: 4 | Fill #0

## 2019-04-23 MED FILL — CLOPIDOGREL 75 MG TABLET: 75 | 90 days supply | Qty: 90 | Fill #2

## 2019-04-23 MED FILL — OMEGA-3 ETHYL ESTERS 1 GM C: 1 | 30 days supply | Qty: 60 | Fill #1

## 2019-05-06 MED FILL — CLOPIDOGREL 75 MG TABLET: 75 | 30 days supply | Qty: 30 | Fill #2

## 2019-05-06 MED FILL — OMEGA-3 ETHYL ESTERS 1 GM C: 1 | 30 days supply | Qty: 60 | Fill #1

## 2019-05-06 MED FILL — MECLIZINE 25 MG TABLET: 25 | 20 days supply | Qty: 60 | Fill #1

## 2019-05-25 ENCOUNTER — Telehealth: Payer: Self-pay | Admitting: Nurse Practitioner

## 2019-05-25 NOTE — Telephone Encounter (Signed)
Patient called to see if another referral could be put in for [facility]. Please follow up

## 2019-05-25 NOTE — Telephone Encounter (Signed)
Please inform patient he can call the [facility]. He had been referred to them previously and can call them to set an appt.  [phone]

## 2019-06-07 ENCOUNTER — Other Ambulatory Visit: Payer: Self-pay | Admitting: Family Medicine

## 2019-06-07 DIAGNOSIS — E785 Hyperlipidemia, unspecified: Secondary | ICD-10-CM

## 2019-06-07 MED FILL — OMEGA-3 ETHYL ESTERS 1 GM C: 1 | 90 days supply | Qty: 180 | Fill #0

## 2019-06-07 MED FILL — ATORVASTATIN CALCIUM 40 MG: 40 | 90 days supply | Qty: 90 | Fill #0

## 2019-07-05 ENCOUNTER — Ambulatory Visit (INDEPENDENT_AMBULATORY_CARE_PROVIDER_SITE_OTHER): Payer: Medicare HMO | Admitting: Cardiology

## 2019-07-05 ENCOUNTER — Other Ambulatory Visit: Payer: Self-pay | Admitting: Nurse Practitioner

## 2019-07-05 ENCOUNTER — Encounter: Payer: Self-pay | Admitting: Cardiology

## 2019-07-05 ENCOUNTER — Other Ambulatory Visit: Payer: Self-pay

## 2019-07-05 VITALS — BP 106/58 | HR 64 | Temp 97.3°F | Ht 66.0 in | Wt 192.0 lb

## 2019-07-05 DIAGNOSIS — Z79899 Other long term (current) drug therapy: Secondary | ICD-10-CM

## 2019-07-05 DIAGNOSIS — E785 Hyperlipidemia, unspecified: Secondary | ICD-10-CM | POA: Diagnosis not present

## 2019-07-05 DIAGNOSIS — I2510 Atherosclerotic heart disease of native coronary artery without angina pectoris: Secondary | ICD-10-CM

## 2019-07-05 DIAGNOSIS — Z720 Tobacco use: Secondary | ICD-10-CM

## 2019-07-05 DIAGNOSIS — R42 Dizziness and giddiness: Secondary | ICD-10-CM

## 2019-07-05 DIAGNOSIS — F17210 Nicotine dependence, cigarettes, uncomplicated: Secondary | ICD-10-CM

## 2019-07-05 DIAGNOSIS — Z716 Tobacco abuse counseling: Secondary | ICD-10-CM

## 2019-07-05 DIAGNOSIS — Z7189 Other specified counseling: Secondary | ICD-10-CM

## 2019-07-05 NOTE — Patient Instructions (Signed)
Medication Instructions:  Stop: Plavix Decrease Aspirin to 81 mg (1 tablet) daily  *If you need a refill on your cardiac medications before your next appointment, please call your pharmacy*  Lab Work: None  Testing/Procedures: None  Follow-Up: At [facility], you and your health needs are our priority.  As part of our continuing mission to provide you with exceptional heart care, we have created designated Provider Care Teams.  These Care Teams include your primary Cardiologist (physician) and Advanced Practice Providers (APPs -  Physician Assistants and Nurse Practitioners) who all work together to provide you with the care you need, when you need it.  Your next appointment:   12 months  The format for your next appointment:   In Person  Provider:   Buford Dresser, MD

## 2019-07-05 NOTE — Progress Notes (Signed)
Cardiology Office Note:    Date:  07/05/2019   ID:  Samuel Lawson, DOB 1946/05/17, MRN [id number]  PCP:  Gildardo Pounds, NP  Cardiologist:  Buford Dresser, MD PhD  Referring MD: Gildardo Pounds, NP   CC: follow up  History of Present Illness:    [name] is a 73 y.o. male with a hx of hypertension, coronary artery disease, hyperlipidemia who is seen in follow up today. He was initially seen as a new consult at the request of Gildardo Pounds, NP for the evaluation and management of disequilibrium on 06/30/18.  Cardiac history: Has had two stents placed in the past, about 10-15 years ago. Had chest pain with exertion at the time of his initial stent, happened again a month later and had another stent placed near the initial stent. Has had no chest pain since.  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 Watching his diet, no fast food/fried food, eats a lot of vegetables. Working on increasing activity.  No alcohol. Smoking 1 ppd, wants to quit. Has gum, plans to quit. Has quit twice in the past, feels confident that he can do it again.  FH: Father had a history of stroke.   Today: Good days and bad days. On bad days, feels like a brain fog, cannot walk/stand easily, feels unstable like he might fall. Sometimes feels like he might pass out, though no full syncope. Events are vertigo, room spinning rapidly. On his good days, feels like he is "73 years old again." Cannot clearly link to what makes a good day or bad day. Had a CT scan, which was unrevealing. Has tried meclizine with some success. Saw ENT as well. Sometimes feels better when he eats something, but other times this does not help.   Reviewed medications list. Discussed aspirin, currently taking 162 mg. Also taking clopidogrel 37.5 mg daily. Reviewed guidelines, recommendations. No history of CVA, distant coronary stents. My recommendation is for aspirin 81 mg daily without  clopidogrel.  Still smoking, trying to quit, but still at about 1 ppd. Has gum, but family issues has been stressful, feels like this is not the best time to quit.  Denies chest pain, shortness of breath at rest or with normal exertion. No PND, orthopnea, LE edema or unexpected weight gain. No full syncope or palpitations.  Past Medical History:  Diagnosis Date  . 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 Collagen vascular disease ([place])   . Diabetes mellitus without complication ([place])   . Hyperlipidemia   . Hypertension     Past Surgical History:  Procedure Laterality Date  . ANGIOPLASTY    . CARDIAC CATHETERIZATION      Current Medications: Current Outpatient Medications on File Prior to Visit  Medication Sig  . ASPIRIN 81 PO Take 81 mg by mouth daily.  Marland Kitchen atorvastatin (LIPITOR) 40 MG tablet Take 1 tablet (40 mg total) by mouth at bedtime.  . carvedilol (COREG) 3.125 MG tablet Take 1 tablet (3.125 mg total) by mouth 2 (two) times daily with a meal.  . meclizine (ANTIVERT) 25 MG tablet Take 1 tablet (25 mg total) by mouth 3 (three) times daily as needed for dizziness.  Marland Kitchen omega-3 acid ethyl esters (LOVAZA) 1 g capsule TAKE 1 CAPSULE (1 G TOTAL) BY MOUTH 2 (TWO) TIMES DAILY.  . tamsulosin (FLOMAX) 0.4 MG CAPS capsule Take 1 capsule (0.4 mg total) by mouth daily.  . [DISCONTINUED]  albuterol (PROVENTIL HFA;VENTOLIN HFA) 108 (90 BASE) MCG/ACT inhaler Inhale 1-2 puffs into the lungs every 6 (six) hours as needed for wheezing or shortness of breath. Or persistent cough (Patient not taking: Reported on 12/14/2014)  . [DISCONTINUED] promethazine (PHENERGAN) 25 MG tablet Take 1 tablet (25 mg total) by mouth every 6 (six) hours as needed for nausea.   No current facility-administered medications on file prior to visit.      Allergies:   Patient has no known allergies.   Social History   Socioeconomic History  . Marital status: Single    Spouse name: Not on file  . Number of children: 2  .  Years of education: B.S.  . Highest education level: Not on file  Occupational History    Employer: SELF EMPLOYED    Comment: Computers - Works for [employer]  . Financial resource strain: Not hard at all  . Food insecurity    Worry: Never true    Inability: Never true  . Transportation needs    Medical: No    Non-medical: No  Tobacco Use  . Smoking status: Current Every Day Smoker    Packs/day: 1.00    Types: Cigarettes  . Smokeless tobacco: Never Used  . Tobacco comment: states new years resolution is to quit smoking  Substance and Sexual Activity  . Alcohol use: No  . Drug use: No  . Sexual activity: Not on file  Lifestyle  . Physical activity    Days per week: 0 days    Minutes per session: 0 min  . Stress: Not at all  Relationships  . Social connections    Talks on phone: More than three times a week    Gets together: More than three times a week    Attends religious service: Never    Active member of club or organization: No    Attends meetings of clubs or organizations: Never    Relationship status: Divorced  Other Topics Concern  . Not on file  Social History Narrative   Patient lives at home alone and he is single. Reports being married in the past. Patient is self employed and works with computers.   Education B.S.   Right handed.   Caffeine one cup of tea daily and two pepsi daily.     Family History: The patient's family history includes Alzheimer's disease in his father; Heart Problems in his mother; High blood pressure in his father; Stroke in his father.  ROS:   Please see the history of present illness.  Additional pertinent ROS: Constitutional: Negative for chills, fever, night sweats, unintentional weight loss  HENT: Negative for ear pain and hearing loss.   Eyes: Negative for loss of vision and eye pain.  Respiratory: Negative for cough, sputum, wheezing.   Cardiovascular: See HPI. Gastrointestinal: Negative for abdominal pain,  melena, and hematochezia.  Genitourinary: Negative for dysuria and hematuria.  Musculoskeletal: Negative for falls and myalgias.  Skin: Negative for itching and rash.  Neurological: Negative for focal weakness, focal sensory changes and loss of consciousness. Positive for frequent vertigo Endo/Heme/Allergies: Does not bruise/bleed easily.   EKGs/Labs/Other Studies Reviewed:    The following studies were reviewed today: Echo 07/03/15 Study Conclusions  - Left ventricle: The cavity size was normal. Wall thickness was   increased in a pattern of moderate LVH. Systolic function was   normal. The estimated ejection fraction was in the range of 60%   to 65%. Wall motion was normal; there were  no regional wall   motion abnormalities. Left ventricular diastolic function   parameters were normal. - Left atrium: The atrium was normal in size. - Inferior vena cava: The vessel was normal in size. The   respirophasic diameter changes were in the normal range (>= 50%),   consistent with normal central venous pressure. - Pericardium, extracardiac: There was no pericardial effusion.  Impressions:  - Compared to a prior echo in 2014, there have been no significant   changes.  MRA 04/12/17 MRA NECK FINDINGS  Precontrast time-of-flight images reveal antegrade flow in both carotid and vertebral arteries in the neck. The left vertebral appears dominant.  Post-contrast neck MRA images re- demonstrate a bovine type arch configuration there is some artifact at the proximal right CCA level, but otherwise the great vessel origins appear stable and within normal limits as seen on the 2016 CTA.  No cervical carotid stenosis. Both cervical ICAs appear normal for age with mild tortuosity.  Dominant left vertebral artery. As before the proximal right vertebral artery is not well visualized. The vertebral arteries appears stable since 2014. The right side functionally terminates in PICA.  MRA  HEAD FINDINGS  Antegrade flow in the posterior circulation with dominant distal left vertebral artery. Patent PICA origins. No distal vertebral stenosis. Patent vertebrobasilar junction. Normal SCA and PCA origins. Tortuous right P1. Fetal type left PCA origin again noted. Bilateral PCA branches are stable and within normal limits. Diminutive right posterior communicating artery.  Antegrade flow in both ICA siphons appears stable since 2014. The left siphon appears dominant. No siphon stenosis. Ophthalmic and posterior communicating artery origins are within normal limits. Patent carotid termini. Normal MCA and ACA origins. Visible bilateral MCA and ACA branches are stable since 2014 and within normal limits.  EKG:  EKG is ordered today.  The ekg ordered 03/16/19 demonstrates sinus bradycardia with 1st degree AV block  Recent Labs: 03/16/2019: ALT 14; BUN 11; Creatinine, Ser 1.10; Hemoglobin 14.3; Platelets 118; Potassium 3.8; Sodium 141  Recent Lipid Panel    Component Value Date/Time   CHOL 127 11/12/2017 0915   TRIG 143 11/12/2017 0915   HDL 47 11/12/2017 0915   CHOLHDL 2.7 11/12/2017 0915   CHOLHDL 3.3 01/22/2016 1655   VLDL 49 (H) 01/22/2016 1655   LDLCALC 51 11/12/2017 0915    Physical Exam:    VS:  BP (!) 106/58 (BP Location: Left Arm, Patient Position: Sitting, Cuff Size: Normal)   Pulse 64   Temp (!) 97.3 F (36.3 C)   Ht 5\' 6"  (1.676 m)   Wt 192 lb (87.1 kg)   BMI 30.99 kg/m     Wt Readings from Last 3 Encounters:  07/05/19 192 lb (87.1 kg)  03/23/19 197 lb (89.4 kg)  06/30/18 196 lb (88.9 kg)   Orthostatic VS for the past 24 hrs (Last 3 readings):  BP- Lying Pulse- Lying BP- Sitting Pulse- Sitting BP- Standing at 0 minutes Pulse- Standing at 0 minutes BP- Standing at 3 minutes Pulse- Standing at 3 minutes  07/05/19 0914 116/71 55 105/65 57 111/64 62 116/73 62    GEN: Well nourished, well developed in no acute distress HEENT: Normal, moist mucous membranes  NECK: No JVD CARDIAC: regular rhythm, normal S1 and S2, no rubs or gallops. No murmurs. VASCULAR: Radial and DP pulses 2+ bilaterally. No carotid bruits RESPIRATORY:  Clear to auscultation without rales, wheezing or rhonchi  ABDOMEN: Soft, non-tender, non-distended MUSCULOSKELETAL:  Ambulates independently SKIN: Warm and dry, no edema NEUROLOGIC:  Alert and oriented x 3. No focal neuro deficits noted. PSYCHIATRIC:  Normal affect   ASSESSMENT:    1. Vertigo   2. Tobacco abuse   3. Tobacco abuse counseling   4. Coronary artery disease involving native coronary artery of native heart without angina pectoris   5. Cardiac risk counseling   6. Counseling on health promotion and disease prevention   7. Medication management   8. Hyperlipidemia LDL goal <70    PLAN:    Disequilibrium/vertigo: no presyncopal symptoms, no chest pain, no carotid bruits, no amaurosis fugax. Prior MRA last year showed patent vasculature. Unlikely that this is cardiac/carotid in nature. Agree with ENT workup for inner ear disturbance. ?BPPV given occasional positional nature, but not always related to this.  History of CAD s/p stents: asymptomatic. Has mixed hyperlipidemia as well -discussed medication management today. Discussed guidelines re: aspirin and clopidogrel. After shared decision making, will stop clopidogrel (he was only taking 1/2 dose) and cut aspirin to 81 mg daily -continue atorvastatin 40 mg. Last LDL 51, at goal of LDL <70 -continue carvedilol 3.125 mg BID. Not orthostatic today  Secondary prevention  -recommend heart healthy/Mediterranean diet, with whole grains, fruits, vegetable, fish, lean meats, nuts, and olive oil. Limit salt. -recommend moderate walking, 3-5 times/week for 30-50 minutes each session. Aim for at least 150 minutes.week. Goal should be pace of 3 miles/hours, or walking 1.5 miles in 30 minutes  Tobacco abuse and counseling: The patient was counseled on tobacco cessation  today for 5 minutes.  Counseling included reviewing the risks of smoking tobacco products, how it impacts the patient's current medical diagnoses and different strategies for quitting.  Pharmacotherapy to aid in tobacco cessation was not prescribed today. He does not feel he can quit at this time due to stress in his life.  Plan for follow up: 1 year or sooner PRN.  TIME SPENT WITH PATIENT: 26 minutes of direct patient care. More than 50% of that time was spent on coordination of care and counseling regarding vertigo, etiology of this, how it relates to heart. Discussed guidelines re: CAD management at length as well as risk factor management.  Buford Dresser, MD, PhD [place]  [facility]   Medication Adjustments/Labs and Tests Ordered: Current medicines are reviewed at length with the patient today.  Concerns regarding medicines are outlined above.  No orders of the defined types were placed in this encounter.  No orders of the defined types were placed in this encounter.   Patient Instructions  Medication Instructions:  Stop: Plavix Decrease Aspirin to 81 mg (1 tablet) daily  *If you need a refill on your cardiac medications before your next appointment, please call your pharmacy*  Lab Work: None  Testing/Procedures: None  Follow-Up: At [facility], you and your health needs are our priority.  As part of our continuing mission to provide you with exceptional heart care, we have created designated Provider Care Teams.  These Care Teams include your primary Cardiologist (physician) and Advanced Practice Providers (APPs -  Physician Assistants and Nurse Practitioners) who all work together to provide you with the care you need, when you need it.  Your next appointment:   12 months  The format for your next appointment:   In Person  Provider:   Buford Dresser, MD       Signed, Buford Dresser, MD PhD 07/05/2019 3:54 PM    [place]

## 2019-07-07 MED FILL — TAMSULOSIN HCL 0.4 MG CAP: 0.4 | 90 days supply | Qty: 90 | Fill #0

## 2019-07-19 ENCOUNTER — Encounter: Payer: Self-pay | Admitting: Nurse Practitioner

## 2019-07-19 ENCOUNTER — Ambulatory Visit: Payer: Medicare HMO | Attending: Nurse Practitioner | Admitting: Nurse Practitioner

## 2019-07-19 ENCOUNTER — Other Ambulatory Visit: Payer: Self-pay

## 2019-07-19 VITALS — BP 117/75 | HR 51 | Temp 98.3°F | Ht 66.0 in | Wt 196.8 lb

## 2019-07-19 DIAGNOSIS — R7303 Prediabetes: Secondary | ICD-10-CM | POA: Diagnosis not present

## 2019-07-19 DIAGNOSIS — I10 Essential (primary) hypertension: Secondary | ICD-10-CM | POA: Diagnosis not present

## 2019-07-19 DIAGNOSIS — R42 Dizziness and giddiness: Secondary | ICD-10-CM | POA: Diagnosis not present

## 2019-07-19 DIAGNOSIS — I2510 Atherosclerotic heart disease of native coronary artery without angina pectoris: Secondary | ICD-10-CM | POA: Diagnosis not present

## 2019-07-19 DIAGNOSIS — K219 Gastro-esophageal reflux disease without esophagitis: Secondary | ICD-10-CM

## 2019-07-19 LAB — GLUCOSE, POCT (MANUAL RESULT ENTRY): POC Glucose: 133 mg/dl — AB (ref 70–99)

## 2019-07-19 MED ORDER — OMEPRAZOLE 40 MG PO CPDR: 40.0000 mg | DELAYED_RELEASE_CAPSULE | Freq: Every day | ORAL | 1 refills | Status: DC

## 2019-07-19 MED FILL — OMEPRAZOLE DR 40 MG CAPSULE: 40 | 90 days supply | Qty: 90 | Fill #0

## 2019-07-19 MED FILL — CARVEDILOL 3.125 MG TABLET: 3.125 | 90 days supply | Qty: 180 | Fill #0

## 2019-07-19 NOTE — Progress Notes (Signed)
HTN   Gets dizzy sometimes when he stands up

## 2019-07-19 NOTE — Progress Notes (Signed)
Assessment & Plan:  Diagnoses and all orders for this visit:  Essential hypertension -     Cancel: CMP14+EGFR -     CMP14+EGFR; Future Continue all antihypertensives as prescribed.  Remember to bring in your blood pressure log with you for your follow up appointment.  DASH/Mediterranean Diets are healthier choices for HTN.    Vertigo -     Ambulatory referral to ENT  Coronary artery disease involving native coronary artery of native heart without angina pectoris -     Cancel: Lipid Panel -     Lipid Panel; Future  Gastroesophageal reflux disease without esophagitis -     Cancel: CBC -     omeprazole (PRILOSEC) 40 MG capsule; Take 1 capsule (40 mg total) by mouth daily. -     CBC; Future INSTRUCTIONS: Avoid GERD Triggers: acidic, spicy or fried foods, caffeine, coffee, sodas,  alcohol and chocolate.   Prediabetes -     Glucose (CBG) -     Cancel: A1c -     Microalbumin/Creatinine Ratio, Urine; Future -     A1c; Future Continue blood sugar control as discussed in office today, low carbohydrate diet, and regular physical exercise as tolerated, 150 minutes per week (30 min each day, 5 days per week, or 50 min 3 days per week). Keep blood sugar logs with fasting goal of 90-130 mg/dl, post prandial (after you eat) less than 180.  For Hypoglycemia: BS <60 and Hyperglycemia BS >400; contact the clinic ASAP. Annual eye exams and foot exams are recommended.     Patient has been counseled on age-appropriate routine health concerns for screening and prevention. These are reviewed and up-to-date. Referrals have been placed accordingly. Immunizations are up-to-date or declined.    Subjective:   HPI Samuel Lawson [age] y.o. male presents to office today for vertigo.  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 Does not have any hearing aids.  He is a heavy smoker.  Continues to complain of chronic disequilibrium and vertigo.  Symptoms are intermittent in presentation.  He denies any falls but states he does sometimes have to hold onto walls.  He has a history of prediabetes and blood glucose level is normal today however he reports sometimes with his dizziness he will eat something and the symptoms resolved.  He was prescribed meclizine 3 times daily as needed last year and continues to take it and frequently.  CT and MRI of the head have all been normal in the past. Will refer to ENT for possible vestibular rehab.  He denies any otalgia but does endorse intermittent itching of the inner ear and some aural pressure. Denies chest pain, shortness of breath, palpitations, headaches or BLE edema.    GERD Ranitidine and famotidine unavailable. Will order prilosec. Insurance will not pay for 20 mg capsule. Symptoms are well controlled.    Review of Systems  Constitutional: Negative for fever, malaise/fatigue and weight loss.  HENT: Positive for hearing loss. Negative for ear discharge, ear pain, nosebleeds and tinnitus.   Eyes: Negative.  Negative for blurred vision, double vision and photophobia.  Respiratory: Negative.  Negative for cough and shortness of breath.   Cardiovascular: Negative.  Negative for chest pain, palpitations and leg swelling.  Gastrointestinal: Positive for heartburn. Negative for nausea and  vomiting.  Musculoskeletal: Negative.  Negative for myalgias.  Neurological: Positive for dizziness. Negative for focal weakness, seizures and headaches.  Psychiatric/Behavioral: Negative.  Negative for suicidal ideas.    Past Medical History:  Diagnosis Date  . Acute MI ([place]) 2009   STENTS PLACED  . Collagen vascular disease ([place])   . Diabetes mellitus without complication ([place])   . Hyperlipidemia   . Hypertension     Past Surgical  History:  Procedure Laterality Date  . ANGIOPLASTY    . CARDIAC CATHETERIZATION      Family History  Problem Relation Age of Onset  . Stroke Father   . Alzheimer's disease Father   . High blood pressure Father   . Heart Problems Mother     Social History Reviewed with no changes to be made today.   Outpatient Medications Prior to Visit  Medication Sig Dispense Refill  . ASPIRIN 81 PO Take 81 mg by mouth daily.    Marland Kitchen atorvastatin (LIPITOR) 40 MG tablet Take 1 tablet (40 mg total) by mouth at bedtime. 90 tablet 1  . carvedilol (COREG) 3.125 MG tablet Take 1 tablet (3.125 mg total) by mouth 2 (two) times daily with a meal. 180 tablet 1  . meclizine (ANTIVERT) 25 MG tablet Take 1 tablet (25 mg total) by mouth 3 (three) times daily as needed for dizziness. 60 tablet 1  . omega-3 acid ethyl esters (LOVAZA) 1 g capsule TAKE 1 CAPSULE (1 G TOTAL) BY MOUTH 2 (TWO) TIMES DAILY. 60 capsule 2  . tamsulosin (FLOMAX) 0.4 MG CAPS capsule Take 1 capsule (0.4 mg total) by mouth daily. 90 capsule 1   No facility-administered medications prior to visit.     No Known Allergies     Objective:    BP 117/75 (BP Location: Left Arm, Patient Position: Sitting, Cuff Size: Large)   Pulse (!) 51   Temp 98.3 F (36.8 C) (Oral)   Ht '5\' 6"'  (1.676 m)   Wt 196 lb 12.8 oz (89.3 kg)   SpO2 99%   BMI 31.76 kg/m  Wt Readings from Last 3 Encounters:  07/19/19 196 lb 12.8 oz (89.3 kg)  07/05/19 192 lb (87.1 kg)  03/23/19 197 lb (89.4 kg)    Physical Exam Vitals signs and nursing note reviewed.  Constitutional:      Appearance: He is well-developed.  HENT:     Head: Normocephalic and atraumatic.     Right Ear: Decreased hearing noted. Tympanic membrane is not perforated or erythematous.     Left Ear: Decreased hearing noted. Tympanic membrane is not perforated or erythematous.  Neck:     Musculoskeletal: Normal range of motion.  Cardiovascular:     Rate and Rhythm: Regular rhythm. Bradycardia  present.     Heart sounds: Normal heart sounds. No murmur. No friction rub. No gallop.   Pulmonary:     Effort: Pulmonary effort is normal. No tachypnea or respiratory distress.     Breath sounds: Normal breath sounds. No decreased breath sounds, wheezing, rhonchi or rales.  Chest:     Chest wall: No tenderness.  Abdominal:     General: Bowel sounds are normal.     Palpations: Abdomen is soft.  Musculoskeletal: Normal range of motion.  Skin:    General: Skin is warm and dry.  Neurological:     Mental Status: He is alert and oriented to person, place, and time.     Coordination: Coordination normal.  Psychiatric:  Behavior: Behavior normal. Behavior is cooperative.        Thought Content: Thought content normal.        Judgment: Judgment normal.          Patient has been counseled extensively about nutrition and exercise as well as the importance of adherence with medications and regular follow-up. The patient was given clear instructions to go to ER or return to medical center if symptoms don't improve, worsen or new problems develop. The patient verbalized understanding.   Follow-up: Return in about 3 months (around 10/19/2019) for prediabetes. Also schedule for labs tomorrow since lab person absent today. Gildardo Pounds, FNP-BC [facility] and [facility] [place], [place]   07/19/2019, 12:08 PM

## 2019-07-23 ENCOUNTER — Other Ambulatory Visit: Payer: Self-pay | Admitting: Family Medicine

## 2019-07-23 DIAGNOSIS — R42 Dizziness and giddiness: Secondary | ICD-10-CM

## 2019-07-23 MED FILL — MECLIZINE 25 MG TABLET: 25 | 20 days supply | Qty: 60 | Fill #0

## 2019-07-26 ENCOUNTER — Other Ambulatory Visit: Payer: Medicare HMO

## 2019-07-27 ENCOUNTER — Other Ambulatory Visit: Payer: Medicare HMO

## 2019-08-17 MED FILL — CIPRODEX OTIC SUSPENSION: 0.3-0.1 | 14 days supply | Qty: 8 | Fill #0

## 2019-08-19 ENCOUNTER — Other Ambulatory Visit: Payer: Self-pay | Admitting: Otolaryngology

## 2019-08-19 DIAGNOSIS — R42 Dizziness and giddiness: Secondary | ICD-10-CM

## 2019-08-23 MED FILL — MECLIZINE 25 MG TABLET: 25 | 20 days supply | Qty: 60 | Fill #0

## 2019-08-30 ENCOUNTER — Other Ambulatory Visit: Payer: Self-pay | Admitting: Family Medicine

## 2019-08-30 DIAGNOSIS — E785 Hyperlipidemia, unspecified: Secondary | ICD-10-CM

## 2019-08-31 MED FILL — OMEGA-3 ETHYL ESTERS 1 GM C: 1 | 90 days supply | Qty: 180 | Fill #0

## 2019-09-13 ENCOUNTER — Other Ambulatory Visit: Payer: Self-pay

## 2019-09-13 ENCOUNTER — Ambulatory Visit
Admission: RE | Admit: 2019-09-13 | Discharge: 2019-09-13 | Disposition: A | Discharge status: Home / Self Care | Payer: Medicare HMO | Source: Ambulatory Visit | Attending: Otolaryngology | Admitting: Otolaryngology

## 2019-09-13 DIAGNOSIS — R42 Dizziness and giddiness: Secondary | ICD-10-CM

## 2019-09-13 MED ORDER — GADOBENATE DIMEGLUMINE 529 MG/ML IV SOLN
20.0000 mL | Freq: Once | INTRAVENOUS | Status: AC | PRN
  Administered 2019-09-13: 20 mL via INTRAVENOUS

## 2019-09-13 MED FILL — ATORVASTATIN CALCIUM 40 MG: 40 | 90 days supply | Qty: 90 | Fill #1

## 2019-10-04 MED FILL — TAMSULOSIN HCL 0.4 MG CAP: 0.4 | 90 days supply | Qty: 90 | Fill #1

## 2019-10-15 MED FILL — CARVEDILOL 3.125 MG TABLET: 3.125 | 90 days supply | Qty: 180 | Fill #1

## 2019-11-17 DIAGNOSIS — H6993 Unspecified Eustachian tube disorder, bilateral: Secondary | ICD-10-CM | POA: Insufficient documentation

## 2019-11-17 DIAGNOSIS — H6983 Other specified disorders of Eustachian tube, bilateral: Secondary | ICD-10-CM | POA: Insufficient documentation

## 2019-11-23 ENCOUNTER — Other Ambulatory Visit: Payer: Self-pay | Admitting: Family Medicine

## 2019-11-23 DIAGNOSIS — E785 Hyperlipidemia, unspecified: Secondary | ICD-10-CM

## 2019-11-23 MED FILL — OMEGA-3 ETHYL ESTERS 1 GM C: 1 | 90 days supply | Qty: 180 | Fill #0

## 2019-12-16 ENCOUNTER — Other Ambulatory Visit: Payer: Self-pay | Admitting: Family Medicine

## 2019-12-16 DIAGNOSIS — E785 Hyperlipidemia, unspecified: Secondary | ICD-10-CM

## 2019-12-16 MED FILL — ATORVASTATIN CALCIUM 40 MG: 40 | 90 days supply | Qty: 90 | Fill #0

## 2019-12-23 MED FILL — CIPRODEX 0.3-0.1 % SUSP: 0.3-0.1 | 14 days supply | Qty: 8 | Fill #0

## 2020-01-03 ENCOUNTER — Other Ambulatory Visit: Payer: Self-pay | Admitting: Family Medicine

## 2020-01-03 DIAGNOSIS — N400 Enlarged prostate without lower urinary tract symptoms: Secondary | ICD-10-CM

## 2020-01-03 MED FILL — CARVEDILOL 3.125 MG TABLET: 3.125 | 90 days supply | Qty: 180 | Fill #0

## 2020-01-04 MED FILL — TAMSULOSIN HCL 0.4 MG CAP: 0.4 | 30 days supply | Qty: 30 | Fill #0

## 2020-01-07 ENCOUNTER — Encounter: Payer: Self-pay | Admitting: Nurse Practitioner

## 2020-01-07 ENCOUNTER — Ambulatory Visit: Payer: Medicare HMO | Attending: Nurse Practitioner | Admitting: Nurse Practitioner

## 2020-01-07 ENCOUNTER — Other Ambulatory Visit: Payer: Self-pay

## 2020-01-07 VITALS — BP 135/76 | HR 59 | Temp 97.2°F | Wt 202.6 lb

## 2020-01-07 DIAGNOSIS — I10 Essential (primary) hypertension: Secondary | ICD-10-CM | POA: Diagnosis not present

## 2020-01-07 DIAGNOSIS — K219 Gastro-esophageal reflux disease without esophagitis: Secondary | ICD-10-CM

## 2020-01-07 DIAGNOSIS — I2510 Atherosclerotic heart disease of native coronary artery without angina pectoris: Secondary | ICD-10-CM

## 2020-01-07 DIAGNOSIS — F17200 Nicotine dependence, unspecified, uncomplicated: Secondary | ICD-10-CM

## 2020-01-07 DIAGNOSIS — D696 Thrombocytopenia, unspecified: Secondary | ICD-10-CM

## 2020-01-07 DIAGNOSIS — R7303 Prediabetes: Secondary | ICD-10-CM | POA: Diagnosis not present

## 2020-01-07 DIAGNOSIS — E785 Hyperlipidemia, unspecified: Secondary | ICD-10-CM | POA: Diagnosis not present

## 2020-01-07 DIAGNOSIS — N400 Enlarged prostate without lower urinary tract symptoms: Secondary | ICD-10-CM

## 2020-01-07 DIAGNOSIS — Z1211 Encounter for screening for malignant neoplasm of colon: Secondary | ICD-10-CM

## 2020-01-07 MED ORDER — CARVEDILOL 3.125 MG PO TABS: 3.1250 mg | ORAL_TABLET | Freq: Two times a day (BID) | ORAL | 1 refills | Status: DC

## 2020-01-07 MED ORDER — ACCU-CHEK GUIDE CONTROL VI LIQD: 1.0000 | Freq: Once | 0 refills | Status: AC | PRN

## 2020-01-07 MED ORDER — OMEPRAZOLE 40 MG PO CPDR: 40.0000 mg | DELAYED_RELEASE_CAPSULE | Freq: Every day | ORAL | 1 refills | Status: DC

## 2020-01-07 MED ORDER — ACCU-CHEK GUIDE ME W/DEVICE KIT: 1.0000 | PACK | Freq: Once | 0 refills | Status: AC

## 2020-01-07 MED ORDER — TAMSULOSIN HCL 0.4 MG PO CAPS: 0.4000 mg | ORAL_CAPSULE | Freq: Every day | ORAL | 0 refills | Status: DC

## 2020-01-07 MED ORDER — ACCU-CHEK GUIDE VI STRP: ORAL_STRIP | 12 refills | Status: DC

## 2020-01-07 MED ORDER — ATORVASTATIN CALCIUM 40 MG PO TABS: 40.0000 mg | ORAL_TABLET | Freq: Every day | ORAL | 2 refills | Status: DC

## 2020-01-07 MED ORDER — ACCU-CHEK SOFTCLIX LANCETS MISC: 3 refills | Status: DC

## 2020-01-07 MED ORDER — OMEGA-3-ACID ETHYL ESTERS 1 G PO CAPS: 1.0000 g | ORAL_CAPSULE | Freq: Two times a day (BID) | ORAL | 1 refills | Status: DC

## 2020-01-07 MED FILL — ACCU-CHEK SOFTCLIX LANCETS: 90 days supply | Qty: 100 | Fill #0

## 2020-01-07 MED FILL — ACCU-CHEK GUIDE TEST STRIP: 90 days supply | Qty: 100 | Fill #0

## 2020-01-07 MED FILL — ACCU-CHEK GUIDE ME W/DEVICE: W/DEVICE | 1 days supply | Qty: 1 | Fill #0

## 2020-01-07 MED FILL — OMEPRAZOLE DR 40 MG CAPSULE: 40 | 90 days supply | Qty: 90 | Fill #0

## 2020-01-07 NOTE — Progress Notes (Signed)
Assessment & Plan:  Samuel Lawson was seen today for follow-up.  Diagnoses and all orders for this visit:  Essential hypertension -     carvedilol (COREG) 3.125 MG tablet; Take 1 tablet (3.125 mg total) by mouth 2 (two) times daily with a meal. -     CMP14+EGFR Continue all antihypertensives as prescribed.  Remember to bring in your blood pressure log with you for your follow up appointment.  DASH/Mediterranean Diets are healthier choices for HTN.    Prediabetes -     glucose blood (ACCU-CHEK GUIDE) test strip; Use as instructed. Check blood glucose by fingerstick once per day. R73.03 -     Accu-Chek Softclix Lancets lancets; Use as instructed. Inject into the skin once daily R73.03 -     Blood Glucose Calibration (ACCU-CHEK GUIDE CONTROL) LIQD; 1 each by In Vitro route once as needed for up to 1 dose. R73.03 -     Blood Glucose Monitoring Suppl (ACCU-CHEK GUIDE ME) w/Device KIT; 1 each by Does not apply route once for 1 dose. R73.03 -     A1c -     Microalbumin/Creatinine Ratio, Urine Continue blood sugar control as discussed in office today, low carbohydrate diet, and regular physical exercise as tolerated, 150 minutes per week (30 min each day, 5 days per week, or 50 min 3 days per week). Keep blood sugar logs with fasting goal of 90-130 mg/dl, post prandial (after you eat) less than 180.  For Hypoglycemia: BS <60 and Hyperglycemia BS >400; contact the clinic ASAP. Annual eye exams and foot exams are recommended.   Hyperlipidemia LDL goal <100 -     atorvastatin (LIPITOR) 40 MG tablet; Take 1 tablet (40 mg total) by mouth at bedtime. -     omega-3 acid ethyl esters (LOVAZA) 1 g capsule; Take 1 capsule (1 g total) by mouth 2 (two) times daily. -     Lipid Panel -     CMP14+EGFR INSTRUCTIONS: Work on a low fat, heart healthy diet and participate in regular aerobic exercise program by working out at least 150 minutes per week; 5 days a week-30 minutes per day. Avoid red meat/beef/steak,   fried foods. junk foods, sodas, sugary drinks, unhealthy snacking, alcohol and smoking.  Drink at least 80 oz of water per day and monitor your carbohydrate intake daily.    Colon cancer screening -     Fecal occult blood, imunochemical([facility])  Tobacco dependence Samuel Lawson was counseled on the dangers of tobacco use, and was advised to quit. Reviewed strategies to maximize success, including removing cigarettes and smoking materials from environment, stress management and support of family/friends as well as pharmacological alternatives including: Wellbutrin, Chantix, Nicotine patch, Nicotine gum or lozenges. Smoking cessation support: smoking cessation hotline: 1-800-QUIT-NOW.  Smoking cessation classes are also available through [facility]. Call [phone] or visit our website at [email or web address]   A total of 70mnutes was spent on counseling for smoking cessation and Samuel Lawson is ready to quit.   Thrombocytopenia ([place] -     CBC  Coronary artery disease involving native coronary artery of native heart without angina pectoris -     carvedilol (COREG) 3.125 MG tablet; Take 1 tablet (3.125 mg total) by mouth 2 (two) times daily with a meal. -     Lipid Panel -     CMP14+EGFR  Gastroesophageal reflux disease without esophagitis -     omeprazole (PRILOSEC) 40 MG capsule; Take 1 capsule (40 mg total) by mouth daily.  INSTRUCTIONS: Avoid GERD Triggers: acidic, spicy or fried foods, caffeine, coffee, sodas,  alcohol and chocolate.   Benign prostatic hyperplasia, unspecified whether lower urinary tract symptoms present -     tamsulosin (FLOMAX) 0.4 MG CAPS capsule; Take 1 capsule (0.4 mg total) by mouth daily.    Patient has been counseled on age-appropriate routine health concerns for screening and prevention. These are reviewed and up-to-date. Referrals have been placed accordingly. Immunizations are up-to-date or declined.    Subjective:   Chief  Complaint  Patient presents with  . Follow-up    Pt. is here for his hypertension. Pt. is requesting additional medication refills.    HPI Samuel Lawson 74 y.o. male presents to office today for follow up.   Seeing ENT for Dizziness and Eustachian tube dysfunction.    Essential Hypertension Well controlled. He does not monitor his blood pressure at home. Currently taking carvedilol 3.125 mg BID. Denies chest pain, shortness of breath, palpitations, lightheadedness, dizziness, headaches or BLE edema.  BP Readings from Last 3 Encounters:  01/07/20 135/76  07/19/19 117/75  07/05/19 (!) 106/58    Prediabetes States he has been drinking lots of Pepsi and eating lots of bread. Worried that his "sugars" are high at home. Feels sleepy after he eats bread and drinks sodas and also feels bloated after he eats lots of carbs. Denies any symptoms of hyperglycemia. I have encouraged him to stop drinking sodas and limit his daily intake of bread to help reduce his risk of developing diabetes. He does not take any diabetic lowering medications.    Lab Results  Component Value Date   HGBA1C 5.6 11/12/2017    Dyslipidemia LDL at goal of <70. Taking atorvastatin 40 mg daily and lovaza 1 gm BID as prescribed. Denies any statin intolerance or myalgias.  Lab Results  Component Value Date   LDLCALC 51 11/12/2017    Review of Systems  Constitutional: Negative for fever, malaise/fatigue and weight loss.  HENT: Negative.  Negative for nosebleeds.   Eyes: Negative.  Negative for blurred vision, double vision and photophobia.  Respiratory: Negative.  Negative for cough, sputum production, shortness of breath and wheezing.   Cardiovascular: Negative.  Negative for chest pain, palpitations and leg swelling.  Gastrointestinal: Positive for heartburn. Negative for nausea and vomiting.  Genitourinary: Positive for frequency.       BPH  Musculoskeletal: Negative.  Negative for myalgias.  Neurological:  Negative.  Negative for dizziness, focal weakness, seizures and headaches.  Psychiatric/Behavioral: Negative.  Negative for suicidal ideas.    Past Medical History:  Diagnosis Date  . Acute MI ([place]) 2009   STENTS PLACED  . Collagen vascular disease ([place])   . Hyperlipidemia   . Hypertension     Past Surgical History:  Procedure Laterality Date  . ANGIOPLASTY    . CARDIAC CATHETERIZATION      Family History  Problem Relation Age of Onset  . Stroke Father   . Alzheimer's disease Father   . High blood pressure Father   . Heart Problems Mother     Social History Reviewed with no changes to be made today.   Outpatient Medications Prior to Visit  Medication Sig Dispense Refill  . ASPIRIN 81 PO Take 81 mg by mouth daily.    . meclizine (ANTIVERT) 25 MG tablet TAKE 1 TABLET (25 MG TOTAL) BY MOUTH 3 (THREE) TIMES DAILY AS NEEDED FOR DIZZINESS. 60 tablet 1  . atorvastatin (LIPITOR) 40 MG tablet TAKE 1 TABLET (40 MG TOTAL)  BY MOUTH AT BEDTIME. 30 tablet 2  . omega-3 acid ethyl esters (LOVAZA) 1 g capsule Take 1 capsule (1 g total) by mouth 2 (two) times daily. Must have office visit for refills 180 capsule 0  . tamsulosin (FLOMAX) 0.4 MG CAPS capsule Take 1 capsule (0.4 mg total) by mouth daily. Please make PCP visit. 30 capsule 0  . carvedilol (COREG) 3.125 MG tablet Take 1 tablet (3.125 mg total) by mouth 2 (two) times daily with a meal. 180 tablet 1  . omeprazole (PRILOSEC) 40 MG capsule Take 1 capsule (40 mg total) by mouth daily. 90 capsule 1   No facility-administered medications prior to visit.    No Known Allergies     Objective:    BP 135/76 (BP Location: Left Arm, Patient Position: Sitting, Cuff Size: Normal)   Pulse (!) 59   Temp (!) 97.2 F (36.2 C) (Temporal)   Wt 202 lb 9.6 oz (91.9 kg)   SpO2 95%   BMI 32.70 kg/m  Wt Readings from Last 3 Encounters:  01/07/20 202 lb 9.6 oz (91.9 kg)  07/19/19 196 lb 12.8 oz (89.3 kg)  07/05/19 192 lb (87.1 kg)     Physical Exam Vitals and nursing note reviewed.  Constitutional:      Appearance: He is well-developed.  HENT:     Head: Normocephalic and atraumatic.  Cardiovascular:     Rate and Rhythm: Normal rate and regular rhythm.     Heart sounds: Normal heart sounds. No murmur. No friction rub. No gallop.   Pulmonary:     Effort: Pulmonary effort is normal. No tachypnea or respiratory distress.     Breath sounds: Normal breath sounds. No decreased breath sounds, wheezing, rhonchi or rales.  Chest:     Chest wall: No tenderness.  Abdominal:     General: Bowel sounds are normal.     Palpations: Abdomen is soft.  Musculoskeletal:        General: Normal range of motion.     Cervical back: Normal range of motion.  Skin:    General: Skin is warm and dry.  Neurological:     Mental Status: He is alert and oriented to person, place, and time.     Coordination: Coordination normal.  Psychiatric:        Behavior: Behavior normal. Behavior is cooperative.        Thought Content: Thought content normal.        Judgment: Judgment normal.          Patient has been counseled extensively about nutrition and exercise as well as the importance of adherence with medications and regular follow-up. The patient was given clear instructions to go to ER or return to medical center if symptoms don't improve, worsen or new problems develop. The patient verbalized understanding.   Follow-up: Return in about 3 months (around 04/07/2020).   Samuel Pounds, FNP-BC [facility] and [facility] [place], [place]   01/07/2020, 9:32 AM

## 2020-01-08 LAB — CBC
Hematocrit: 45.6 % (ref 37.5–51.0)
Hemoglobin: 15.4 g/dL (ref 13.0–17.7)
MCH: 30.7 pg (ref 26.6–33.0)
MCHC: 33.8 g/dL (ref 31.5–35.7)
MCV: 91 fL (ref 79–97)
Platelets: 138 10*3/uL — ABNORMAL LOW (ref 150–450)
RBC: 5.01 x10E6/uL (ref 4.14–5.80)
RDW: 12.4 % (ref 11.6–15.4)
WBC: 7.3 10*3/uL (ref 3.4–10.8)

## 2020-01-08 LAB — LIPID PANEL
Chol/HDL Ratio: 2.6 ratio (ref 0.0–5.0)
Cholesterol, Total: 117 mg/dL (ref 100–199)
HDL: 45 mg/dL (ref >39)
LDL Chol Calc (NIH): 54 mg/dL (ref 0–99)
Triglycerides: 92 mg/dL (ref 0–149)
VLDL Cholesterol Cal: 18 mg/dL (ref 5–40)

## 2020-01-08 LAB — CMP14+EGFR
ALT: 11 IU/L (ref 0–44)
AST: 16 IU/L (ref 0–40)
Albumin/Globulin Ratio: 2 (ref 1.2–2.2)
Albumin: 4.6 g/dL (ref 3.7–4.7)
Alkaline Phosphatase: 76 IU/L (ref 39–117)
BUN/Creatinine Ratio: 10 (ref 10–24)
BUN: 12 mg/dL (ref 8–27)
Bilirubin Total: 1.3 mg/dL — ABNORMAL HIGH (ref 0.0–1.2)
CO2: 24 mmol/L (ref 20–29)
Calcium: 9.2 mg/dL (ref 8.6–10.2)
Chloride: 104 mmol/L (ref 96–106)
Creatinine, Ser: 1.15 mg/dL (ref 0.76–1.27)
GFR calc Af Amer: 74 mL/min/{1.73_m2} (ref >59)
GFR calc non Af Amer: 64 mL/min/{1.73_m2} (ref >59)
Globulin, Total: 2.3 g/dL (ref 1.5–4.5)
Glucose: 109 mg/dL — ABNORMAL HIGH (ref 65–99)
Potassium: 4.1 mmol/L (ref 3.5–5.2)
Sodium: 141 mmol/L (ref 134–144)
Total Protein: 6.9 g/dL (ref 6.0–8.5)

## 2020-01-08 LAB — HEMOGLOBIN A1C
Est. average glucose Bld gHb Est-mCnc: 117 mg/dL
Hgb A1c MFr Bld: 5.7 % — ABNORMAL HIGH (ref 4.8–5.6)

## 2020-01-08 LAB — MICROALBUMIN / CREATININE URINE RATIO
Creatinine, Urine: 24 mg/dL
Microalb/Creat Ratio: <13 mg/g creat (ref 0–29)
Microalbumin, Urine: <3 ug/mL

## 2020-01-14 LAB — FECAL OCCULT BLOOD, IMMUNOCHEMICAL: Fecal Occult Bld: POSITIVE — AB

## 2020-01-22 ENCOUNTER — Other Ambulatory Visit: Payer: Self-pay | Admitting: Nurse Practitioner

## 2020-01-22 DIAGNOSIS — R195 Other fecal abnormalities: Secondary | ICD-10-CM

## 2020-01-22 DIAGNOSIS — Z1211 Encounter for screening for malignant neoplasm of colon: Secondary | ICD-10-CM

## 2020-02-03 MED FILL — TAMSULOSIN HCL 0.4 MG CAP: 0.4 | 90 days supply | Qty: 90 | Fill #0

## 2020-02-25 MED FILL — OMEGA-3 ETHYL ESTERS 1 GM C: 1 | 90 days supply | Qty: 180 | Fill #0

## 2020-03-16 MED FILL — ATORVASTATIN CALCIUM 40 MG: 40 | 90 days supply | Qty: 90 | Fill #0

## 2020-03-17 ENCOUNTER — Telehealth: Payer: Self-pay | Admitting: Nurse Practitioner

## 2020-03-17 NOTE — Telephone Encounter (Signed)
Patient came into the office and requested to see if pcp would prescribe him something for toe fungus. Please follow up at your earliest convenience.  Samuel Lawson

## 2020-03-17 NOTE — Telephone Encounter (Signed)
Will route to PCP 

## 2020-03-21 ENCOUNTER — Other Ambulatory Visit: Payer: Self-pay | Admitting: Nurse Practitioner

## 2020-03-21 DIAGNOSIS — B351 Tinea unguium: Secondary | ICD-10-CM

## 2020-03-21 NOTE — Telephone Encounter (Signed)
I will place referral to podiatry.

## 2020-03-22 NOTE — Telephone Encounter (Signed)
Attempt to reach patient to inform PCP placed a podiatry referral. No answer and unable to LVM due to mailbox is full.

## 2020-04-05 ENCOUNTER — Other Ambulatory Visit: Payer: Self-pay | Admitting: Podiatry

## 2020-04-05 ENCOUNTER — Encounter: Payer: Self-pay | Admitting: Podiatry

## 2020-04-05 ENCOUNTER — Ambulatory Visit (INDEPENDENT_AMBULATORY_CARE_PROVIDER_SITE_OTHER): Payer: Medicare HMO | Admitting: Podiatry

## 2020-04-05 ENCOUNTER — Other Ambulatory Visit: Payer: Self-pay

## 2020-04-05 DIAGNOSIS — M79675 Pain in left toe(s): Secondary | ICD-10-CM | POA: Diagnosis not present

## 2020-04-05 DIAGNOSIS — B351 Tinea unguium: Secondary | ICD-10-CM | POA: Diagnosis not present

## 2020-04-05 DIAGNOSIS — M79674 Pain in right toe(s): Secondary | ICD-10-CM | POA: Diagnosis not present

## 2020-04-05 MED ORDER — CICLOPIROX 8 % EX SOLN: Freq: Every day | CUTANEOUS | 3 refills | Status: DC

## 2020-04-05 MED FILL — CICLOPIROX 8% SOLUTION: 8 | 30 days supply | Qty: 7 | Fill #0

## 2020-04-05 NOTE — Patient Instructions (Signed)

## 2020-04-06 ENCOUNTER — Ambulatory Visit: Payer: Self-pay | Admitting: Podiatry

## 2020-04-06 NOTE — Progress Notes (Signed)
  Subjective:  Patient ID: Samuel Lawson, male    DOB: 1946/08/07,  MRN: [id number]  Chief Complaint  Patient presents with  . Nail Problem    thick painful toenails    74 y.o. male presents with the above complaint. History confirmed with patient.  He has had thickening, elongation, discoloration with brittleness and debris from the bilateral hallux nail for some time.  He wonders about his nail plates.  Objective:  Physical Exam: warm, good capillary refill, no trophic changes or ulcerative lesions, normal DP and PT pulses and normal sensory exam. Hallux nail discolored, thick, elongated bilaterally  Assessment:   1. Onychomycosis   2. Pain due to onychomycosis of toenails of both feet      Plan:  Patient was evaluated and treated and all questions answered.   Discussed the etiology and treatment options for the condition in detail with the patient. Educated patient on the topical and oral treatment options for mycotic nails. Recommended debridement of the nails today. Sharp and mechanical debridement performed of all painful and mycotic nails today. Nails debrided in length and thickness using a nail nipper and a mechanical burr to level of comfort. Discussed treatment options including appropriate shoe gear. Follow up as needed for painful nails.  Prescription for Penlac sent to his pharmacy and reviewed use.  Lanae Crumbly, DPM 04/06/2020     Return in about 3 months (around 07/06/2020) for nail trim.

## 2020-04-07 ENCOUNTER — Ambulatory Visit: Payer: Medicare HMO | Attending: Nurse Practitioner | Admitting: Nurse Practitioner

## 2020-04-07 ENCOUNTER — Encounter: Payer: Self-pay | Admitting: Nurse Practitioner

## 2020-04-07 ENCOUNTER — Other Ambulatory Visit: Payer: Self-pay

## 2020-04-07 VITALS — BP 111/70 | HR 53 | Temp 97.7°F | Ht 67.0 in | Wt 190.0 lb

## 2020-04-07 DIAGNOSIS — I10 Essential (primary) hypertension: Secondary | ICD-10-CM

## 2020-04-07 DIAGNOSIS — R7303 Prediabetes: Secondary | ICD-10-CM | POA: Diagnosis not present

## 2020-04-07 DIAGNOSIS — I2510 Atherosclerotic heart disease of native coronary artery without angina pectoris: Secondary | ICD-10-CM

## 2020-04-07 DIAGNOSIS — Z125 Encounter for screening for malignant neoplasm of prostate: Secondary | ICD-10-CM

## 2020-04-07 LAB — GLUCOSE, POCT (MANUAL RESULT ENTRY): POC Glucose: 105 mg/dl — AB (ref 70–99)

## 2020-04-07 MED FILL — ACCU-CHEK GUIDE TEST STRIP: 90 days supply | Qty: 100 | Fill #1

## 2020-04-07 NOTE — Progress Notes (Signed)
Assessment & Plan:  Samuel Lawson was seen today for follow-up.  Diagnoses and all orders for this visit:  Essential hypertension -     CMP14+EGFR -     TSH Continue all antihypertensives as prescribed.  Remember to bring in your blood pressure log with you for your follow up appointment.  DASH/Mediterranean Diets are healthier choices for HTN.    Prediabetes -     Glucose (CBG) -     TSH  Prostate cancer screening -     PSA    Patient has been counseled on age-appropriate routine health concerns for screening and prevention. These are reviewed and up-to-date. Referrals have been placed accordingly. Immunizations are up-to-date or declined.    Subjective:   Chief Complaint  Patient presents with  . Follow-up    Pt. is here for 3 months F.U on HTN.   HPI Samuel Lawson 74 y.o. male presents to office today for f/u HTN.  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    He continues to smoke although he is  is trying very hard to quit. Has cut down to 1ppd from 2 ppd.   Essential Hypertension Blood pressure is well controlled. He is currently taking carvedilol 3.125 mg BID. Denies chest pain, shortness of breath, palpitations, lightheadedness, dizziness, headaches or BLE edema.  BP Readings from Last 3 Encounters:  04/07/20 111/70  01/07/20 135/76  07/19/19 117/75    Prediabetes Well controlled. Has lost 12 lbs since April. Currently on the keto diet.  Lab Results  Component Value Date   HGBA1C 5.7 (H) 01/07/2020    Review of Systems  Constitutional: Negative for fever, malaise/fatigue and weight loss.  HENT: Negative.  Negative for nosebleeds.   Eyes: Negative.  Negative for blurred vision, double vision and photophobia.  Respiratory: Negative.  Negative for cough and shortness of breath.   Cardiovascular: Negative.  Negative for chest pain, palpitations and leg swelling.    Gastrointestinal: Negative.  Negative for heartburn, nausea and vomiting.  Musculoskeletal: Negative.  Negative for myalgias.  Neurological: Negative.  Negative for dizziness, focal weakness, seizures and headaches.  Psychiatric/Behavioral: Negative.  Negative for suicidal ideas.    Past Medical History:  Diagnosis Date  . Acute MI ([place]) 2009   STENTS PLACED  . Collagen vascular disease ([place])   . Hyperlipidemia   . Hypertension     Past Surgical History:  Procedure Laterality Date  . ANGIOPLASTY    . CARDIAC CATHETERIZATION      Family History  Problem Relation Age of Onset  . Stroke Father   . Alzheimer's disease Father   . High blood pressure Father   . Heart Problems Mother     Social History Reviewed with no changes to be made today.   Outpatient Medications Prior to Visit  Medication Sig Dispense Refill  . Accu-Chek Softclix Lancets lancets Use as instructed. Inject into the skin once daily R73.03 100 each 3  . ASPIRIN 81 PO Take 81 mg by mouth daily.    Marland Kitchen atorvastatin (LIPITOR) 40 MG tablet Take 1 tablet (40 mg total) by mouth at bedtime. 30 tablet 2  . Blood Glucose Calibration (ACCU-CHEK GUIDE CONTROL) LIQD 1 each by In Vitro route once as needed for up to 1 dose. R73.03 1 each 0  . ciclopirox (PENLAC) 8 % solution Apply topically at bedtime. Apply over nail and surrounding skin. Apply daily over previous coat. After seven (7) days,  may remove with alcohol and continue cycle. 6.6 mL 3  . glucose blood (ACCU-CHEK GUIDE) test strip Use as instructed. Check blood glucose by fingerstick once per day. R73.03 100 each 12  . meclizine (ANTIVERT) 25 MG tablet TAKE 1 TABLET (25 MG TOTAL) BY MOUTH 3 (THREE) TIMES DAILY AS NEEDED FOR DIZZINESS. 60 tablet 1  . omega-3 acid ethyl esters (LOVAZA) 1 g capsule Take 1 capsule (1 g total) by mouth 2 (two) times daily. 180 capsule 1  . Omega-3 Fatty Acids (FISH OIL) 1000 MG CAPS Take by mouth.    . carvedilol (COREG) 3.125 MG tablet  Take 1 tablet (3.125 mg total) by mouth 2 (two) times daily with a meal. 180 tablet 1  . omeprazole (PRILOSEC) 40 MG capsule Take 1 capsule (40 mg total) by mouth daily. 90 capsule 1   No facility-administered medications prior to visit.    No Known Allergies     Objective:    BP 111/70 (BP Location: Left Arm, Patient Position: Sitting, Cuff Size: Normal)   Pulse 53   Temp 97.7 F (36.5 C) (Temporal)   Ht '5\' 7"'  (1.702 m)   Wt 190 lb (86.2 kg)   SpO2 97%   BMI 29.76 kg/m  Wt Readings from Last 3 Encounters:  04/07/20 190 lb (86.2 kg)  01/07/20 202 lb 9.6 oz (91.9 kg)  07/19/19 196 lb 12.8 oz (89.3 kg)    Physical Exam Vitals and nursing note reviewed.  Constitutional:      Appearance: He is well-developed.  HENT:     Head: Normocephalic and atraumatic.  Cardiovascular:     Rate and Rhythm: Regular rhythm. Bradycardia present.     Heart sounds: Normal heart sounds. No murmur heard.  No friction rub. No gallop.   Pulmonary:     Effort: Pulmonary effort is normal. No tachypnea or respiratory distress.     Breath sounds: Normal breath sounds. No decreased breath sounds, wheezing, rhonchi or rales.  Chest:     Chest wall: No tenderness.  Abdominal:     General: Bowel sounds are normal.     Palpations: Abdomen is soft.  Musculoskeletal:        General: Normal range of motion.     Cervical back: Normal range of motion.  Skin:    General: Skin is warm and dry.  Neurological:     Mental Status: He is alert and oriented to person, place, and time.     Coordination: Coordination normal.  Psychiatric:        Behavior: Behavior normal. Behavior is cooperative.        Thought Content: Thought content normal.        Judgment: Judgment normal.          Patient has been counseled extensively about nutrition and exercise as well as the importance of adherence with medications and regular follow-up. The patient was given clear instructions to go to ER or return to medical  center if symptoms don't improve, worsen or new problems develop. The patient verbalized understanding.   Follow-up: Return in about 3 months (around 07/08/2020) for REPEAT CBC/CMP/HTN.   Gildardo Pounds, FNP-BC [facility] [place], [place]   04/09/2020, 12:40 AM

## 2020-04-07 NOTE — Patient Instructions (Signed)
Pls remember to stop taking 2 aspirin. You should only be taking one aspirin 81 mg.   Remember to bring in your paperwork from [facility] regarding your stool study.

## 2020-04-08 LAB — CMP14+EGFR
ALT: 10 IU/L (ref 0–44)
AST: 15 IU/L (ref 0–40)
Albumin/Globulin Ratio: 1.9 (ref 1.2–2.2)
Albumin: 4.4 g/dL (ref 3.7–4.7)
Alkaline Phosphatase: 66 IU/L (ref 48–121)
BUN/Creatinine Ratio: 14 (ref 10–24)
BUN: 15 mg/dL (ref 8–27)
Bilirubin Total: 1 mg/dL (ref 0.0–1.2)
CO2: 24 mmol/L (ref 20–29)
Calcium: 9.4 mg/dL (ref 8.6–10.2)
Chloride: 105 mmol/L (ref 96–106)
Creatinine, Ser: 1.06 mg/dL (ref 0.76–1.27)
GFR calc Af Amer: 81 mL/min/{1.73_m2} (ref >59)
GFR calc non Af Amer: 70 mL/min/{1.73_m2} (ref >59)
Globulin, Total: 2.3 g/dL (ref 1.5–4.5)
Glucose: 101 mg/dL — ABNORMAL HIGH (ref 65–99)
Potassium: 4.2 mmol/L (ref 3.5–5.2)
Sodium: 141 mmol/L (ref 134–144)
Total Protein: 6.7 g/dL (ref 6.0–8.5)

## 2020-04-08 LAB — PSA: Prostate Specific Ag, Serum: 1.3 ng/mL (ref 0.0–4.0)

## 2020-04-08 LAB — TSH: TSH: 2.7 u[IU]/mL (ref 0.450–4.500)

## 2020-04-09 ENCOUNTER — Other Ambulatory Visit: Payer: Self-pay | Admitting: Nurse Practitioner

## 2020-04-09 ENCOUNTER — Encounter: Payer: Self-pay | Admitting: Nurse Practitioner

## 2020-04-09 MED ORDER — CARVEDILOL 3.125 MG PO TABS: 3.1250 mg | ORAL_TABLET | Freq: Two times a day (BID) | ORAL | 1 refills | Status: DC

## 2020-04-10 MED FILL — CARVEDILOL 3.125 MG TABLET: 3.125 | 90 days supply | Qty: 180 | Fill #0

## 2020-04-18 MED FILL — CARVEDILOL 3.125 MG TABLET: 3.125 | 90 days supply | Qty: 180 | Fill #0

## 2020-05-10 ENCOUNTER — Other Ambulatory Visit: Payer: Self-pay | Admitting: Nurse Practitioner

## 2020-05-10 DIAGNOSIS — N400 Enlarged prostate without lower urinary tract symptoms: Secondary | ICD-10-CM

## 2020-05-10 MED FILL — TAMSULOSIN HCL 0.4 MG CAP: 0.4 | 90 days supply | Qty: 90 | Fill #0

## 2020-05-10 NOTE — Telephone Encounter (Signed)
Requested medication (s) are due for refill today: no  Requested medication (s) are on the active medication list: no  Last refill: 02/03/2020  Future visit scheduled: no  Notes to clinic:  medication not on current medication list  Review for use    Requested Prescriptions  Pending Prescriptions Disp Refills   tamsulosin (FLOMAX) 0.4 MG CAPS capsule [Pharmacy Med Name: TAMSULOSIN HCL 0.4 MG CAP 0.4 Capsule] 90 capsule 0    Sig: Take 1 capsule (0.4 mg total) by mouth daily.      Urology: Alpha-Adrenergic Blocker Passed - 05/10/2020  8:23 AM      Passed - Last BP in normal range    BP Readings from Last 1 Encounters:  04/07/20 111/70          Passed - Valid encounter within last 12 months    Recent Outpatient Visits           1 month ago Essential hypertension   [place], Vernia Buff, NP   4 months ago Essential hypertension   [place], Zelda W, NP   9 months ago [place], Vernia Buff, NP   1 year ago Impacted cerumen of right ear   [place], MD   1 year ago Medication refill   [place] Gildardo Pounds, NP

## 2020-05-18 MED FILL — OMEGA-3 ETHYL ESTERS 1 GM C: 1 | 90 days supply | Qty: 180 | Fill #1

## 2020-06-13 ENCOUNTER — Other Ambulatory Visit: Payer: Self-pay | Admitting: Nurse Practitioner

## 2020-06-13 DIAGNOSIS — E785 Hyperlipidemia, unspecified: Secondary | ICD-10-CM

## 2020-06-19 ENCOUNTER — Other Ambulatory Visit: Payer: Self-pay | Admitting: Nurse Practitioner

## 2020-06-19 DIAGNOSIS — E785 Hyperlipidemia, unspecified: Secondary | ICD-10-CM

## 2020-06-19 MED FILL — CICLOPIROX 8% SOLUTION: 8 | 30 days supply | Qty: 7 | Fill #1

## 2020-06-20 MED FILL — ATORVASTATIN CALCIUM 40 MG: 40 | 90 days supply | Qty: 90 | Fill #0

## 2020-07-06 ENCOUNTER — Ambulatory Visit: Payer: Medicare HMO | Admitting: Podiatry

## 2020-07-06 ENCOUNTER — Other Ambulatory Visit: Payer: Self-pay

## 2020-07-06 ENCOUNTER — Ambulatory Visit (INDEPENDENT_AMBULATORY_CARE_PROVIDER_SITE_OTHER): Payer: Medicare HMO | Admitting: Cardiology

## 2020-07-06 ENCOUNTER — Encounter: Payer: Self-pay | Admitting: Cardiology

## 2020-07-06 VITALS — BP 122/60 | HR 69 | Ht 70.0 in | Wt 184.0 lb

## 2020-07-06 DIAGNOSIS — F17210 Nicotine dependence, cigarettes, uncomplicated: Secondary | ICD-10-CM | POA: Diagnosis not present

## 2020-07-06 DIAGNOSIS — Z716 Tobacco abuse counseling: Secondary | ICD-10-CM

## 2020-07-06 DIAGNOSIS — E785 Hyperlipidemia, unspecified: Secondary | ICD-10-CM | POA: Diagnosis not present

## 2020-07-06 DIAGNOSIS — Z7189 Other specified counseling: Secondary | ICD-10-CM

## 2020-07-06 DIAGNOSIS — I2510 Atherosclerotic heart disease of native coronary artery without angina pectoris: Secondary | ICD-10-CM | POA: Diagnosis not present

## 2020-07-06 DIAGNOSIS — Z720 Tobacco use: Secondary | ICD-10-CM

## 2020-07-06 NOTE — Patient Instructions (Signed)

## 2020-07-06 NOTE — Progress Notes (Signed)
Cardiology Office Note:    Date:  07/06/2020   ID:  Samuel Lawson, DOB 23-Jan-1946, MRN [id number]  PCP:  Gildardo Pounds, NP  Cardiologist:  Buford Dresser, MD PhD  Referring MD: Gildardo Pounds, NP   CC: follow up  History of Present Illness:    Samuel Lawson is a 74 y.o. male with a hx of hypertension, coronary artery disease, hyperlipidemia who is seen in follow up today. He was initially seen as a new consult at the request of Gildardo Pounds, NP for the evaluation and management of disequilibrium on 06/30/18.  Cardiac history: Has had two stents placed in the past, about 10-15 years ago. Had chest pain with exertion at the time of his initial stent, happened again a month later and had another stent placed near the initial stent. Has had no chest pain since.  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 Watching his diet, no fast food/fried food, eats a lot of vegetables. Working on increasing activity.  No alcohol. Smoking 1 ppd, wants to quit. Has gum, plans to quit. Has quit twice in the past, feels confident that he can do it again.  FH: Father had a history of stroke.   Today: Has lost about 25 lbs doing keto diet. Down to to 81 mg daily. Eating many vegetables, especially leafy greens. Does intermittent fasting. Blood sugar much improved, fasting sugar down from 120 to 85. No processed, caned, fast food. Blood pressure well controlled. Has wrist cuff at home. Discussed goal blood pressure.  Trying to quit smoking. Oldest daughter is a doctor at [employer], younger daughter is stressing him financially. Feels that this stress is limiting his ability to quit.   Working 7 days/week. Walks all day long in his shop.  Had an episode recently when he was was sweating, nauseated, vomiting. Negative for Covid. Lasted one day, then improved. Feels it was GI related.  We discussed the Covid vaccination, which I recommended. He will pursue,  prefers [employer] vaccine.   Denies chest pain, shortness of breath at rest or with normal exertion. No PND, orthopnea, LE edema or unexpected weight gain. No syncope or palpitations.  Past Medical History:  Diagnosis Date  . Acute MI ([place]) 2009   STENTS PLACED  . Collagen vascular disease ([place])   . Hyperlipidemia   . Hypertension     Past Surgical History:  Procedure Laterality Date  . ANGIOPLASTY    . CARDIAC CATHETERIZATION      Current Medications: Current Outpatient Medications on File Prior to Visit  Medication Sig  . Accu-Chek Softclix Lancets lancets Use as instructed. Inject into the skin once daily R73.03  . ASPIRIN 81 PO Take 81 mg by mouth daily.  Marland Kitchen atorvastatin (LIPITOR) 40 MG tablet TAKE 1 TABLET (40 MG TOTAL) BY MOUTH AT BEDTIME.  Marland Kitchen Blood Glucose Calibration (ACCU-CHEK GUIDE CONTROL) LIQD 1 each by In Vitro route once as needed for up to 1 dose. R73.03  . carvedilol (COREG) 3.125 MG tablet Take 1 tablet (3.125 mg total) by mouth 2 (two) times daily with a meal.  . ciclopirox (PENLAC) 8 % solution Apply topically at bedtime. Apply over nail and surrounding skin. Apply daily over previous coat. After seven (7) days, may remove with alcohol and continue cycle.  Marland Kitchen glucose blood (ACCU-CHEK GUIDE) test strip Use as instructed. Check blood glucose by fingerstick once per day. R73.03  . meclizine (ANTIVERT) 25 MG tablet TAKE  1 TABLET (25 MG TOTAL) BY MOUTH 3 (THREE) TIMES DAILY AS NEEDED FOR DIZZINESS.  Marland Kitchen omega-3 acid ethyl esters (LOVAZA) 1 g capsule Take 1 capsule (1 g total) by mouth 2 (two) times daily.  . Omega-3 Fatty Acids (FISH OIL) 1000 MG CAPS Take by mouth.  . tamsulosin (FLOMAX) 0.4 MG CAPS capsule TAKE 1 CAPSULE (0.4 MG TOTAL) BY MOUTH DAILY.  . [DISCONTINUED] albuterol (PROVENTIL HFA;VENTOLIN HFA) 108 (90 BASE) MCG/ACT inhaler Inhale 1-2 puffs into the lungs every 6 (six) hours as needed for wheezing or shortness of breath. Or persistent cough (Patient not  taking: Reported on 12/14/2014)  . [DISCONTINUED] promethazine (PHENERGAN) 25 MG tablet Take 1 tablet (25 mg total) by mouth every 6 (six) hours as needed for nausea.   No current facility-administered medications on file prior to visit.     Allergies:   Patient has no known allergies.   Social History   Socioeconomic History  . Marital status: Single    Spouse name: Not on file  . Number of children: 2  . Years of education: B.S.  . Highest education level: Not on file  Occupational History    Employer: SELF EMPLOYED    Comment: Computers - Works for [employer]  Tobacco Use  . Smoking status: Current Every Day Smoker    Packs/day: 1.00    Types: Cigarettes  . Smokeless tobacco: Never Used  . Tobacco comment: states new years resolution is to quit smoking  Vaping Use  . Vaping Use: Never used  Substance and Sexual Activity  . Alcohol use: No  . Drug use: No  . Sexual activity: Not on file  Other Topics Concern  . Not on file  Social History Narrative   Patient lives at home alone and he is single. Reports being married in the past. Patient is self employed and works with computers.   Education B.S.   Right handed.   Caffeine one cup of tea daily and two pepsi daily.   Social Determinants of Health   Financial Resource Strain:   . Difficulty of Paying Living Expenses: Not on file  Food Insecurity:   . Worried About Charity fundraiser in the Last Year: Not on file  . Ran Out of Food in the Last Year: Not on file  Transportation Needs:   . Lack of Transportation (Medical): Not on file  . Lack of Transportation (Non-Medical): Not on file  Physical Activity:   . Days of Exercise per Week: Not on file  . Minutes of Exercise per Session: Not on file  Stress:   . Feeling of Stress : Not on file  Social Connections:   . Frequency of Communication with Friends and Family: Not on file  . Frequency of Social Gatherings with Friends and Family: Not on file  . Attends Religious  Services: Not on file  . Active Member of Clubs or Organizations: Not on file  . Attends Archivist Meetings: Not on file  . Marital Status: Not on file     Family History: The patient's family history includes Alzheimer's disease in his father; Heart Problems in his mother; High blood pressure in his father; Stroke in his father.  ROS:   Please see the history of present illness.  Additional pertinent ROS otherwise unremarkable.   EKGs/Labs/Other Studies Reviewed:    The following studies were reviewed today: Echo 07/03/15 Study Conclusions  - Left ventricle: The cavity size was normal. Wall thickness was   increased  in a pattern of moderate LVH. Systolic function was   normal. The estimated ejection fraction was in the range of 60%   to 65%. Wall motion was normal; there were no regional wall   motion abnormalities. Left ventricular diastolic function   parameters were normal. - Left atrium: The atrium was normal in size. - Inferior vena cava: The vessel was normal in size. The   respirophasic diameter changes were in the normal range (>= 50%),   consistent with normal central venous pressure. - Pericardium, extracardiac: There was no pericardial effusion.  Impressions:  - Compared to a prior echo in 2014, there have been no significant   changes.  MRA 04/12/17 MRA NECK FINDINGS  Precontrast time-of-flight images reveal antegrade flow in both carotid and vertebral arteries in the neck. The left vertebral appears dominant.  Post-contrast neck MRA images re- demonstrate a bovine type arch configuration there is some artifact at the proximal right CCA level, but otherwise the great vessel origins appear stable and within normal limits as seen on the 2016 CTA.  No cervical carotid stenosis. Both cervical ICAs appear normal for age with mild tortuosity.  Dominant left vertebral artery. As before the proximal right vertebral artery is not well visualized.  The vertebral arteries appears stable since 2014. The right side functionally terminates in PICA.  MRA HEAD FINDINGS  Antegrade flow in the posterior circulation with dominant distal left vertebral artery. Patent PICA origins. No distal vertebral stenosis. Patent vertebrobasilar junction. Normal SCA and PCA origins. Tortuous right P1. Fetal type left PCA origin again noted. Bilateral PCA branches are stable and within normal limits. Diminutive right posterior communicating artery.  Antegrade flow in both ICA siphons appears stable since 2014. The left siphon appears dominant. No siphon stenosis. Ophthalmic and posterior communicating artery origins are within normal limits. Patent carotid termini. Normal MCA and ACA origins. Visible bilateral MCA and ACA branches are stable since 2014 and within normal limits.  EKG:  EKG is personally reviewed today.  The ekg ordered today demonstrates sinus rhythm with 1st degree AV block and a PVC, HR 69 bpm  Recent Labs: 01/07/2020: Hemoglobin 15.4; Platelets 138 04/07/2020: ALT 10; BUN 15; Creatinine, Ser 1.06; Potassium 4.2; Sodium 141; TSH 2.700  Recent Lipid Panel    Component Value Date/Time   CHOL 117 01/07/2020 0914   TRIG 92 01/07/2020 0914   HDL 45 01/07/2020 0914   CHOLHDL 2.6 01/07/2020 0914   CHOLHDL 3.3 01/22/2016 1655   VLDL 49 (H) 01/22/2016 1655   LDLCALC 54 01/07/2020 0914    Physical Exam:    VS:  BP 122/60 (BP Location: Left Arm, Patient Position: Sitting, Cuff Size: Normal)   Pulse 69   Ht 5\' 10"  (1.778 m)   Wt 184 lb (83.5 kg)   BMI 26.40 kg/m     Wt Readings from Last 3 Encounters:  07/06/20 184 lb (83.5 kg)  04/07/20 190 lb (86.2 kg)  01/07/20 202 lb 9.6 oz (91.9 kg)   GEN: Well nourished, well developed in no acute distress HEENT: Normal, moist mucous membranes NECK: No JVD CARDIAC: regular rhythm with occasional premature beat, normal S1 and S2, no rubs or gallops. No murmur. VASCULAR: Radial and DP  pulses 2+ bilaterally. No carotid bruits RESPIRATORY:  Clear to auscultation without rales, wheezing or rhonchi  ABDOMEN: Soft, non-tender, non-distended MUSCULOSKELETAL:  Ambulates independently SKIN: Warm and dry, no edema NEUROLOGIC:  Alert and oriented x 3. No focal neuro deficits noted. PSYCHIATRIC:  Normal affect  ASSESSMENT:    1. Coronary artery disease involving native coronary artery of native heart without angina pectoris   2. Tobacco abuse   3. Tobacco abuse counseling   4. Hyperlipidemia LDL goal <70   5. Cardiac risk counseling   6. Counseling on health promotion and disease prevention   7. Educated about COVID-19 virus infection    PLAN:    History of CAD s/p stents Mixed hyperlipidemia -continue aspirin 81 mg daily -continue atorvastatin 40 mg. Last LDL 51, at goal of LDL <70 -continue carvedilol 3.125 mg BID. -counseled on red flag warning signs that need immediate medical attention -last lipids per [facility] 01/07/20: Tchol 117, HDL 45, LDL 54, TG 92  Secondary prevention  -recommend heart healthy/Mediterranean diet, with whole grains, fruits, vegetable, fish, lean meats, nuts, and olive oil. Limit salt. -recommend moderate walking, 3-5 times/week for 30-50 minutes each session. Aim for at least 150 minutes.week. Goal should be pace of 3 miles/hours, or walking 1.5 miles in 30 minutes -has lost 25 lbs, eating very healthy  Tobacco abuse and counseling: The patient was counseled on tobacco cessation today for 4 minutes.  Counseling included reviewing the risks of smoking tobacco products, how it impacts the patient's current medical diagnoses and different strategies for quitting.  Pharmacotherapy to aid in tobacco cessation was not prescribed today.  Covid education: with his heart disease, he would be considered to be in a high risk group. We discussed vaccination, and I would encourage him to get vaccinated with either the [place] or [employer] vaccine. He is amenable to  [employer] vaccine and will look into it  Plan for follow up: 1 year or sooner PRN.  Buford Dresser, MD, PhD [facility]   Medication Adjustments/Labs and Tests Ordered: Current medicines are reviewed at length with the patient today.  Concerns regarding medicines are outlined above.  Orders Placed This Encounter  Procedures  . EKG 12-Lead   No orders of the defined types were placed in this encounter.   Patient Instructions  Medication Instructions:  Your Physician recommend you continue on your current medication as directed.    *If you need a refill on your cardiac medications before your next appointment, please call your pharmacy*   Lab Work: None ordered    Testing/Procedures: None ordered    Follow-Up: At [facility], you and your health needs are our priority.  As part of our continuing mission to provide you with exceptional heart care, we have created designated Provider Care Teams.  These Care Teams include your primary Cardiologist (physician) and Advanced Practice Providers (APPs -  Physician Assistants and Nurse Practitioners) who all work together to provide you with the care you need, when you need it.  We recommend signing up for the patient portal called "MyChart".  Sign up information is provided on this After Visit Summary.  MyChart is used to connect with patients for Virtual Visits (Telemedicine).  Patients are able to view lab/test results, encounter notes, upcoming appointments, etc.  Non-urgent messages can be sent to your provider as well.   To learn more about what you can do with MyChart, go to [email or web address].    Your next appointment:   1 year(s)  The format for your next appointment:   In Person  Provider:   Buford Dresser, MD      Signed, Buford Dresser, MD PhD 07/06/2020 5:36 PM    [place]

## 2020-07-06 NOTE — Progress Notes (Signed)
E 

## 2020-07-19 MED FILL — CARVEDILOL 3.125 MG TABLET: 3.125 | 90 days supply | Qty: 180 | Fill #1

## 2020-07-24 ENCOUNTER — Encounter: Payer: Self-pay | Admitting: Nurse Practitioner

## 2020-07-24 ENCOUNTER — Other Ambulatory Visit: Payer: Self-pay | Admitting: Nurse Practitioner

## 2020-07-24 ENCOUNTER — Ambulatory Visit: Payer: Medicare HMO | Attending: Nurse Practitioner | Admitting: Nurse Practitioner

## 2020-07-24 ENCOUNTER — Other Ambulatory Visit: Payer: Self-pay

## 2020-07-24 VITALS — BP 109/71 | HR 55 | Temp 96.6°F | Ht 67.0 in | Wt 182.4 lb

## 2020-07-24 DIAGNOSIS — R7303 Prediabetes: Secondary | ICD-10-CM | POA: Diagnosis not present

## 2020-07-24 DIAGNOSIS — Z1211 Encounter for screening for malignant neoplasm of colon: Secondary | ICD-10-CM

## 2020-07-24 DIAGNOSIS — N400 Enlarged prostate without lower urinary tract symptoms: Secondary | ICD-10-CM

## 2020-07-24 DIAGNOSIS — I10 Essential (primary) hypertension: Secondary | ICD-10-CM

## 2020-07-24 DIAGNOSIS — E559 Vitamin D deficiency, unspecified: Secondary | ICD-10-CM

## 2020-07-24 DIAGNOSIS — R42 Dizziness and giddiness: Secondary | ICD-10-CM

## 2020-07-24 DIAGNOSIS — I2510 Atherosclerotic heart disease of native coronary artery without angina pectoris: Secondary | ICD-10-CM

## 2020-07-24 DIAGNOSIS — E785 Hyperlipidemia, unspecified: Secondary | ICD-10-CM | POA: Diagnosis not present

## 2020-07-24 DIAGNOSIS — Z0100 Encounter for examination of eyes and vision without abnormal findings: Secondary | ICD-10-CM

## 2020-07-24 DIAGNOSIS — D696 Thrombocytopenia, unspecified: Secondary | ICD-10-CM

## 2020-07-24 DIAGNOSIS — R413 Other amnesia: Secondary | ICD-10-CM

## 2020-07-24 LAB — GLUCOSE, POCT (MANUAL RESULT ENTRY): POC Glucose: 130 mg/dl — AB (ref 70–99)

## 2020-07-24 LAB — POCT GLYCOSYLATED HEMOGLOBIN (HGB A1C): Hemoglobin A1C: 5.3 % (ref 4.0–5.6)

## 2020-07-24 MED ORDER — MECLIZINE HCL 25 MG PO TABS: 25.0000 mg | ORAL_TABLET | Freq: Three times a day (TID) | ORAL | 1 refills | Status: DC | PRN

## 2020-07-24 MED ORDER — ATORVASTATIN CALCIUM 40 MG PO TABS: 40.0000 mg | ORAL_TABLET | Freq: Every day | ORAL | 0 refills | Status: DC

## 2020-07-24 MED ORDER — CARVEDILOL 3.125 MG PO TABS: 3.1250 mg | ORAL_TABLET | Freq: Two times a day (BID) | ORAL | 1 refills | Status: DC

## 2020-07-24 MED ORDER — OMEGA-3-ACID ETHYL ESTERS 1 G PO CAPS: 1.0000 g | ORAL_CAPSULE | Freq: Two times a day (BID) | ORAL | 1 refills | Status: DC

## 2020-07-24 MED ORDER — TAMSULOSIN HCL 0.4 MG PO CAPS: 0.4000 mg | ORAL_CAPSULE | Freq: Every day | ORAL | 2 refills | Status: DC

## 2020-07-24 MED FILL — MECLIZINE 25 MG TABLET: 25 | 20 days supply | Qty: 60 | Fill #0

## 2020-07-24 NOTE — Progress Notes (Signed)
Assessment & Plan:  Samuel Lawson was seen today for hypertension.  Diagnoses and all orders for this visit:  Essential hypertension -     CMP14+EGFR -     carvedilol (COREG) 3.125 MG tablet; Take 1 tablet (3.125 mg total) by mouth 2 (two) times daily with a meal. Continue all antihypertensives as prescribed.  Remember to bring in your blood pressure log with you for your follow up appointment.  DASH/Mediterranean Diets are healthier choices for HTN.    Prediabetes -     Glucose (CBG) -     HgB A1c  Hyperlipidemia LDL goal <100 -     atorvastatin (LIPITOR) 40 MG tablet; Take 1 tablet (40 mg total) by mouth at bedtime. -     omega-3 acid ethyl esters (LOVAZA) 1 g capsule; Take 1 capsule (1 g total) by mouth 2 (two) times daily. INSTRUCTIONS: Work on a low fat, heart healthy diet and participate in regular aerobic exercise program by working out at least 150 minutes per week; 5 days a week-30 minutes per day. Avoid red meat/beef/steak,  fried foods. junk foods, sodas, sugary drinks, unhealthy snacking, alcohol and smoking.  Drink at least 80 oz of water per day and monitor your carbohydrate intake daily.    Benign prostatic hyperplasia, unspecified whether lower urinary tract symptoms present -     tamsulosin (FLOMAX) 0.4 MG CAPS capsule; Take 1 capsule (0.4 mg total) by mouth daily.  Thrombocytopenia (HCC) -     CBC  Memory loss of unknown cause -     Vitamin B12  Vitamin D deficiency disease -     VITAMIN D 25 Hydroxy (Vit-D Deficiency, Fractures)  Eye exam, routine -     Ambulatory referral to Ophthalmology  Coronary artery disease involving native coronary artery of native heart without angina pectoris -     carvedilol (COREG) 3.125 MG tablet; Take 1 tablet (3.125 mg total) by mouth 2 (two) times daily with a meal.  Vertigo -     meclizine (ANTIVERT) 25 MG tablet; Take 1 tablet (25 mg total) by mouth 3 (three) times daily as needed for dizziness.  Colon cancer screening -      Fecal occult blood, imunochemical([facility])    Patient has been counseled on age-appropriate routine health concerns for screening and prevention. These are reviewed and up-to-date. Referrals have been placed accordingly. Immunizations are up-to-date or declined.    Subjective:   Chief Complaint  Patient presents with  . Hypertension    Pt. is here blood pressure check.    HPI Samuel Lawson 74 y.o. male presents to office today for follow up.   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   Prediabetes Well controlled. He is doing the keto diet. Blood pressure is well controlled. He has lost over 20lbs. He continues to smoke. Not ready to quit. Taking atorvastatin 40 mg daily, lovaza 1 gm BID for dyslipidemia and carvedilol 3.125 mg BID for HTN. He denies any symptoms of fatigue.  Lab Results  Component Value Date   HGBA1C 5.3 07/24/2020    BP Readings from Last 3 Encounters:  07/24/20 109/71  07/06/20 122/60  04/07/20 111/70   Lab Results  Component Value Date   LDLCALC 54 01/07/2020    BPH Symptoms of frequency and urgency controlled with tamsulosin 0.4 mg daily.    Review of Systems  Constitutional: Negative for fever, malaise/fatigue and weight loss.  HENT: Negative.  Negative for nosebleeds.   Eyes: Negative.  Negative for blurred vision, double vision and photophobia.  Respiratory: Negative.  Negative for cough and shortness of breath.   Cardiovascular: Negative.  Negative for chest pain, palpitations and leg swelling.  Gastrointestinal: Negative.  Negative for heartburn, nausea and vomiting.  Genitourinary: Positive for frequency and urgency.       BPH  Musculoskeletal: Negative.  Negative for myalgias.  Neurological: Positive for dizziness (improved with meclizine). Negative for focal weakness, seizures and headaches.  Psychiatric/Behavioral: Positive for memory loss.  Negative for suicidal ideas.    Past Medical History:  Diagnosis Date  . Acute MI ([place]) 2009   STENTS PLACED  . Collagen vascular disease ([place])   . Hyperlipidemia   . Hypertension     Past Surgical History:  Procedure Laterality Date  . ANGIOPLASTY    . CARDIAC CATHETERIZATION      Family History  Problem Relation Age of Onset  . Stroke Father   . Alzheimer's disease Father   . High blood pressure Father   . Heart Problems Mother     Social History Reviewed with no changes to be made today.   Outpatient Medications Prior to Visit  Medication Sig Dispense Refill  . Accu-Chek Softclix Lancets lancets Use as instructed. Inject into the skin once daily R73.03 100 each 3  . ASPIRIN 81 PO Take 81 mg by mouth daily.    . Blood Glucose Calibration (ACCU-CHEK GUIDE CONTROL) LIQD 1 each by In Vitro route once as needed for up to 1 dose. R73.03 1 each 0  . ciclopirox (PENLAC) 8 % solution Apply topically at bedtime. Apply over nail and surrounding skin. Apply daily over previous coat. After seven (7) days, may remove with alcohol and continue cycle. 6.6 mL 3  . glucose blood (ACCU-CHEK GUIDE) test strip Use as instructed. Check blood glucose by fingerstick once per day. R73.03 100 each 12  . atorvastatin (LIPITOR) 40 MG tablet TAKE 1 TABLET (40 MG TOTAL) BY MOUTH AT BEDTIME. 90 tablet 0  . meclizine (ANTIVERT) 25 MG tablet TAKE 1 TABLET (25 MG TOTAL) BY MOUTH 3 (THREE) TIMES DAILY AS NEEDED FOR DIZZINESS. 60 tablet 1  . omega-3 acid ethyl esters (LOVAZA) 1 g capsule Take 1 capsule (1 g total) by mouth 2 (two) times daily. 180 capsule 1  . Omega-3 Fatty Acids (FISH OIL) 1000 MG CAPS Take by mouth.    . tamsulosin (FLOMAX) 0.4 MG CAPS capsule TAKE 1 CAPSULE (0.4 MG TOTAL) BY MOUTH DAILY. 30 capsule 2  . carvedilol (COREG) 3.125 MG tablet Take 1 tablet (3.125 mg total) by mouth 2 (two) times daily with a meal. 180 tablet 1   No facility-administered medications prior to visit.    No  Known Allergies     Objective:    BP 109/71 (BP Location: Left Arm, Patient Position: Sitting, Cuff Size: Normal)   Pulse (!) 55   Temp (!) 96.6 F (35.9 C) (Temporal)   Ht '5\' 7"'  (1.702 m)   Wt 182 lb 6.4 oz (82.7 kg)   SpO2 99%   BMI 28.57 kg/m  Wt Readings from Last 3 Encounters:  07/24/20 182 lb 6.4 oz (82.7 kg)  07/06/20 184 lb (83.5 kg)  04/07/20 190 lb (86.2 kg)    Physical Exam Vitals and nursing note reviewed.  Constitutional:      Appearance: He is well-developed.  HENT:     Head: Normocephalic and atraumatic.  Cardiovascular:     Rate  and Rhythm: Regular rhythm. Bradycardia present.     Heart sounds: Normal heart sounds. No murmur heard.  No friction rub. No gallop.   Pulmonary:     Effort: Pulmonary effort is normal. No tachypnea or respiratory distress.     Breath sounds: Normal breath sounds. No decreased breath sounds, wheezing, rhonchi or rales.  Chest:     Chest wall: No tenderness.  Abdominal:     General: Bowel sounds are normal.     Palpations: Abdomen is soft.  Musculoskeletal:        General: Normal range of motion.     Cervical back: Normal range of motion.  Skin:    General: Skin is warm and dry.  Neurological:     Mental Status: He is alert and oriented to person, place, and time.     Coordination: Coordination normal.  Psychiatric:        Mood and Affect: Mood normal.        Behavior: Behavior is cooperative.        Thought Content: Thought content normal.        Cognition and Memory: He exhibits impaired recent memory.          Patient has been counseled extensively about nutrition and exercise as well as the importance of adherence with medications and regular follow-up. The patient was given clear instructions to go to ER or return to medical center if symptoms don't improve, worsen or new problems develop. The patient verbalized understanding.   Follow-up: Return in about 3 months (around 10/24/2020).   Gildardo Pounds,  FNP-BC [facility] [place], [place]   07/29/2020, 6:16 PM

## 2020-07-25 LAB — CMP14+EGFR
ALT: 17 IU/L (ref 0–44)
AST: 20 IU/L (ref 0–40)
Albumin/Globulin Ratio: 2 (ref 1.2–2.2)
Albumin: 4.7 g/dL (ref 3.7–4.7)
Alkaline Phosphatase: 77 IU/L (ref 44–121)
BUN/Creatinine Ratio: 15 (ref 10–24)
BUN: 14 mg/dL (ref 8–27)
Bilirubin Total: 0.8 mg/dL (ref 0.0–1.2)
CO2: 23 mmol/L (ref 20–29)
Calcium: 9.5 mg/dL (ref 8.6–10.2)
Chloride: 103 mmol/L (ref 96–106)
Creatinine, Ser: 0.95 mg/dL (ref 0.76–1.27)
GFR calc Af Amer: 92 mL/min/{1.73_m2} (ref >59)
GFR calc non Af Amer: 80 mL/min/{1.73_m2} (ref >59)
Globulin, Total: 2.4 g/dL (ref 1.5–4.5)
Glucose: 106 mg/dL — ABNORMAL HIGH (ref 65–99)
Potassium: 4.2 mmol/L (ref 3.5–5.2)
Sodium: 140 mmol/L (ref 134–144)
Total Protein: 7.1 g/dL (ref 6.0–8.5)

## 2020-07-25 LAB — CBC
Hematocrit: 48.7 % (ref 37.5–51.0)
Hemoglobin: 16.1 g/dL (ref 13.0–17.7)
MCH: 31.6 pg (ref 26.6–33.0)
MCHC: 33.1 g/dL (ref 31.5–35.7)
MCV: 96 fL (ref 79–97)
Platelets: 129 10*3/uL — ABNORMAL LOW (ref 150–450)
RBC: 5.1 x10E6/uL (ref 4.14–5.80)
RDW: 12.3 % (ref 11.6–15.4)
WBC: 7.3 10*3/uL (ref 3.4–10.8)

## 2020-07-25 LAB — VITAMIN D 25 HYDROXY (VIT D DEFICIENCY, FRACTURES): Vit D, 25-Hydroxy: 19.2 ng/mL — ABNORMAL LOW (ref 30.0–100.0)

## 2020-07-25 LAB — VITAMIN B12: Vitamin B-12: 373 pg/mL (ref 232–1245)

## 2020-07-27 ENCOUNTER — Telehealth: Payer: Self-pay

## 2020-07-27 NOTE — Telephone Encounter (Signed)
Patient came into clinic about his blood glucometer being checked. Patient had been informed several times prior that he needed an appointment and patient did not make an appointment. I informed the patient per PCP that the difference in his blood sugar readings at the office and at home were not alarming. Patient responded "do you think I'm stupid... I'm a Scientist, clinical (histocompatibility and immunogenetics)". I then got the [place] thinking he may listen to her, he then told Bryson Dames "I am not stupid, I am smarter than all of you" and refused to acknowledge Bryson Dames asking me for her name. Patient was told yesterday as well that he needed an appointment to have his glucometer checked. I scheduled him tomorrow with Lurena Joiner and patient proceeded to tell us how we treated him like trash.

## 2020-07-27 NOTE — Telephone Encounter (Signed)
Patient came into the clinic this morning and stated he would like his meter device check. Pt. Stated when he had a OV w/ his PCP on 07/24/2020 his sugar was 130s, and after the visit he checked his sugar w/ his own meter device and it was around 95s. CMA informed patient that sugar reading can fluctuate and patient said that the number cannot be 30 number different and patient stated " Do you think I am stupid?". CMA respond back and said no, no one call you stupid, and tried to explain to him that the sugar reading can change. Pt. Wanted CMA to check his sugar w/ our clinic meter and CMA informed he will need an appointment for that. Patient got furious and stated that CMA cannot even check his sugar for just a few minutes. Pt. Started to say we treating him like trash and he have more education than [place].

## 2020-07-28 ENCOUNTER — Other Ambulatory Visit: Payer: Self-pay

## 2020-07-28 ENCOUNTER — Ambulatory Visit: Payer: Medicare HMO | Attending: Family Medicine | Admitting: Pharmacist

## 2020-07-28 DIAGNOSIS — R7303 Prediabetes: Secondary | ICD-10-CM

## 2020-07-28 NOTE — Progress Notes (Signed)
Patient was educated on the use of the Accu Chek guide blood glucose meter. Reviewed necessary supplies and operation of the meter. Also reviewed goal blood glucose levels. Patient was able to demonstrate use. All questions and concerns were addressed.  Benard Halsted, PharmD, [place] [phone]

## 2020-07-29 ENCOUNTER — Other Ambulatory Visit: Payer: Self-pay | Admitting: Nurse Practitioner

## 2020-07-29 ENCOUNTER — Encounter: Payer: Self-pay | Admitting: Nurse Practitioner

## 2020-07-29 DIAGNOSIS — D696 Thrombocytopenia, unspecified: Secondary | ICD-10-CM

## 2020-07-29 MED ORDER — VITAMIN D (ERGOCALCIFEROL) 1.25 MG (50000 UNIT) PO CAPS: 50000.0000 [IU] | ORAL_CAPSULE | ORAL | 1 refills | Status: DC

## 2020-07-31 ENCOUNTER — Telehealth: Payer: Self-pay | Admitting: Hematology and Oncology

## 2020-07-31 MED FILL — VIT D2 1.25 MG (50,000 UNIT: 1.25 MG | 27 days supply | Qty: 4 | Fill #0

## 2020-07-31 NOTE — Telephone Encounter (Signed)
Scheduled appointment per 11/19 new patient referral. Spoke to patient who is aware of appointment date and time.

## 2020-08-06 NOTE — Progress Notes (Signed)
[place] CONSULT NOTE  Patient Care Team: Gildardo Pounds, NP as PCP - General (Nurse Practitioner) Buford Dresser, MD as PCP - Cardiology (Cardiology)  CHIEF COMPLAINTS/PURPOSE OF CONSULTATION:  Newly diagnosed thrombocytopenia  HISTORY OF PRESENTING ILLNESS:  Samuel Lawson 74 y.o. male is here because of recent diagnosis of thrombocytopenia. Labs on 07/24/20 showed platelets 129 and on 01/07/20 platelets 139. he presents to the clinic today for initial evaluation.  He reports no increased bruising or bleeding.  He has appears history of coronary artery disease and at one point he was on aspirin Plavix where he did bleed more readily.  Currently he is off Plavix and his aspirin dose has been lowered to 81 mg.  He does not have any bruising or bleeding issues.  I reviewed his records extensively and collaborated the history with the patient.   MEDICAL HISTORY:  Past Medical History:  Diagnosis Date  . Acute MI ([place]) 2009   STENTS PLACED  . Collagen vascular disease ([place])   . Hyperlipidemia   . Hypertension     SURGICAL HISTORY: Past Surgical History:  Procedure Laterality Date  . ANGIOPLASTY    . CARDIAC CATHETERIZATION      SOCIAL HISTORY: Social History   Socioeconomic History  . Marital status: Single    Spouse name: Not on file  . Number of children: 2  . Years of education: B.S.  . Highest education level: Not on file  Occupational History    Employer: SELF EMPLOYED    Comment: Computers - Works for [employer]  Tobacco Use  . Smoking status: Current Every Day Smoker    Packs/day: 1.00    Types: Cigarettes  . Smokeless tobacco: Never Used  . Tobacco comment: states new years resolution is to quit smoking  Vaping Use  . Vaping Use: Never used  Substance and Sexual Activity  . Alcohol use: No  . Drug use: No  . Sexual activity: Not on file  Other Topics Concern  . Not on file  Social History Narrative   Patient lives at home  alone and he is single. Reports being married in the past. Patient is self employed and works with computers.   Education B.S.   Right handed.   Caffeine one cup of tea daily and two pepsi daily.   Social Determinants of Health   Financial Resource Strain:   . Difficulty of Paying Living Expenses: Not on file  Food Insecurity:   . Worried About Charity fundraiser in the Last Year: Not on file  . Ran Out of Food in the Last Year: Not on file  Transportation Needs:   . Lack of Transportation (Medical): Not on file  . Lack of Transportation (Non-Medical): Not on file  Physical Activity:   . Days of Exercise per Week: Not on file  . Minutes of Exercise per Session: Not on file  Stress:   . Feeling of Stress : Not on file  Social Connections:   . Frequency of Communication with Friends and Family: Not on file  . Frequency of Social Gatherings with Friends and Family: Not on file  . Attends Religious Services: Not on file  . Active Member of Clubs or Organizations: Not on file  . Attends Archivist Meetings: Not on file  . Marital Status: Not on file  Intimate Partner Violence:   . Fear of Current or Ex-Partner: Not on file  . Emotionally Abused: Not on file  . Physically Abused:  Not on file  . Sexually Abused: Not on file    FAMILY HISTORY: Family History  Problem Relation Age of Onset  . Stroke Father   . Alzheimer's disease Father   . High blood pressure Father   . Heart Problems Mother     ALLERGIES:  has No Known Allergies.  MEDICATIONS:  Current Outpatient Medications  Medication Sig Dispense Refill  . Accu-Chek Softclix Lancets lancets Use as instructed. Inject into the skin once daily R73.03 100 each 3  . ASPIRIN 81 PO Take 81 mg by mouth daily.    Marland Kitchen atorvastatin (LIPITOR) 40 MG tablet Take 1 tablet (40 mg total) by mouth at bedtime. 90 tablet 0  . Blood Glucose Calibration (ACCU-CHEK GUIDE CONTROL) LIQD 1 each by In Vitro route once as needed for up to  1 dose. R73.03 1 each 0  . carvedilol (COREG) 3.125 MG tablet Take 1 tablet (3.125 mg total) by mouth 2 (two) times daily with a meal. 180 tablet 1  . ciclopirox (PENLAC) 8 % solution Apply topically at bedtime. Apply over nail and surrounding skin. Apply daily over previous coat. After seven (7) days, may remove with alcohol and continue cycle. 6.6 mL 3  . glucose blood (ACCU-CHEK GUIDE) test strip Use as instructed. Check blood glucose by fingerstick once per day. R73.03 100 each 12  . meclizine (ANTIVERT) 25 MG tablet Take 1 tablet (25 mg total) by mouth 3 (three) times daily as needed for dizziness. 60 tablet 1  . omega-3 acid ethyl esters (LOVAZA) 1 g capsule Take 1 capsule (1 g total) by mouth 2 (two) times daily. 180 capsule 1  . tamsulosin (FLOMAX) 0.4 MG CAPS capsule Take 1 capsule (0.4 mg total) by mouth daily. 30 capsule 2  . Vitamin D, Ergocalciferol, (DRISDOL) 1.25 MG (50000 UNIT) CAPS capsule Take 1 capsule (50,000 Units total) by mouth every 7 (seven) days. 12 capsule 1   No current facility-administered medications for this visit.    REVIEW OF SYSTEMS:    All other systems were reviewed with the patient and are negative.  PHYSICAL EXAMINATION: ECOG PERFORMANCE STATUS: 1 - Symptomatic but completely ambulatory  Vitals:   08/07/20 1604  BP: 132/65  Pulse: (!) 59  Resp: 19  Temp: 97.6 F (36.4 C)  SpO2: 100%   Filed Weights   08/07/20 1604  Weight: 181 lb (82.1 kg)    GENERAL:alert, no distress and comfortable No hepatosplenomegaly to physical examination  LABORATORY DATA:  I have reviewed the data as listed Lab Results  Component Value Date   WBC 7.3 07/24/2020   HGB 16.1 07/24/2020   HCT 48.7 07/24/2020   MCV 96 07/24/2020   PLT 129 (L) 07/24/2020   Lab Results  Component Value Date   NA 140 07/24/2020   K 4.2 07/24/2020   CL 103 07/24/2020   CO2 23 07/24/2020    RADIOGRAPHIC STUDIES: I have personally reviewed the radiological reports and agreed  with the findings in the report.  ASSESSMENT AND PLAN:  Thrombocytopenia ([place]) Mild thrombocytopenia: Platelet count 129 on 07/24/2020;   Since 2013 platelet counts ranged from 118-145 This has not significantly changed over the past 8 years.  Differential diagnosis: 1. Low-grade ITP 2. medication induced: On further review patient is not taking any medications that are associated with thrombocytopenia 3. Bone marrow factors: Unlikely because the white count and the hemoglobin are fine 4. Hepatitis B/C: He reports that he does not have any risk factors for hepatitis 5.  Splenomegaly: No enlarged spleen was palpable to physical exam.  However I suspect that he may have a fatty liver given his history of diabetes.  He has however lost 20 pounds over the past several months by watching his diet.  Therefore I did not think there is any benefit to obtaining a spleen ultrasound.   I discussed with the patient that the level of thrombocytopenia is very mild and that these levels, there are usually no adverse effects. There is usually no risk of bleeding and hence it can be observed without making any changes to patient's medications or requiring any further investigations like bone marrow biopsies.  I recommended watchful monitoring.  I instructed him to call us back if his platelet counts drop below 75.    All questions were answered. The patient knows to call the clinic with any problems, questions or concerns.   Rulon Eisenmenger, MD, MPH 08/07/2020    I, Molly Dorshimer, am acting as scribe for Nicholas Lose, MD.  I have reviewed the above documentation for accuracy and completeness, and I agree with the above.

## 2020-08-07 ENCOUNTER — Other Ambulatory Visit: Payer: Self-pay

## 2020-08-07 ENCOUNTER — Inpatient Hospital Stay: Payer: Medicare HMO | Attending: Hematology and Oncology | Admitting: Hematology and Oncology

## 2020-08-07 DIAGNOSIS — D696 Thrombocytopenia, unspecified: Secondary | ICD-10-CM | POA: Diagnosis present

## 2020-08-07 DIAGNOSIS — F17210 Nicotine dependence, cigarettes, uncomplicated: Secondary | ICD-10-CM | POA: Diagnosis not present

## 2020-08-07 DIAGNOSIS — Z7902 Long term (current) use of antithrombotics/antiplatelets: Secondary | ICD-10-CM | POA: Diagnosis not present

## 2020-08-07 DIAGNOSIS — I2510 Atherosclerotic heart disease of native coronary artery without angina pectoris: Secondary | ICD-10-CM | POA: Insufficient documentation

## 2020-08-07 NOTE — Assessment & Plan Note (Addendum)
Mild thrombocytopenia: Platelet count 129 on 07/24/2020;   Since 2013 platelet counts ranged from 118-145 This has not significantly changed over the past 8 years.  Differential diagnosis: 1. Low-grade ITP 2. medication induced: On further review patient is not taking any medications that are associated with thrombocytopenia 3. Bone marrow factors: Unlikely because the white count and the hemoglobin are fine 4. Hepatitis B/C: He reports that he does not have any risk factors for hepatitis 5. Splenomegaly: No enlarged spleen was palpable to physical exam.  However I suspect that he may have a fatty liver given his history of diabetes.  He has however lost 20 pounds over the past several months by watching his diet.  Therefore I did not think there is any benefit to obtaining a spleen ultrasound.   I discussed with the patient that the level of thrombocytopenia is very mild and that these levels, there are usually no adverse effects. There is usually no risk of bleeding and hence it can be observed without making any changes to patient's medications or requiring any further investigations like bone marrow biopsies.  I recommended watchful monitoring.  I instructed him to call us back if his platelet counts drop below 75.

## 2020-08-08 ENCOUNTER — Other Ambulatory Visit: Payer: Self-pay | Admitting: Family Medicine

## 2020-08-08 DIAGNOSIS — N400 Enlarged prostate without lower urinary tract symptoms: Secondary | ICD-10-CM

## 2020-08-08 MED FILL — TAMSULOSIN HCL 0.4 MG CAP: 0.4 | 90 days supply | Qty: 90 | Fill #0

## 2020-08-09 ENCOUNTER — Telehealth: Payer: Self-pay | Admitting: Hematology and Oncology

## 2020-08-09 NOTE — Telephone Encounter (Signed)
No 11/29 los, no changes made to pt schedule  

## 2020-08-16 MED FILL — OMEGA-3 ETHYL ESTERS 1 GM C: 1 | 90 days supply | Qty: 180 | Fill #0

## 2020-08-18 ENCOUNTER — Ambulatory Visit: Payer: Self-pay | Admitting: *Deleted

## 2020-08-18 NOTE — Telephone Encounter (Signed)
Pt was difficult to triage due to his hearing difficulty.  I had to repeat most things I said and speak very loudly and then sometimes he still had trouble understanding me.  I returned his call.  He is complaining of "having an 80% hearing loss".   I was never able to determine if it involved both ears or just one.  He contradicted himself when I tried to determine which ear or both was involved. He first noticed he could not hear the TV last night so he went to bed.   This morning he could not hear his car when he started it.   Also stated,  "It sounds like I'm talking in my head".   Denied any illnesses.  He had a covid test 2 weeks ago and it was negative. I wasn't able to get a clear answer from him.   He mentioned his daughter had covid 2 weeks ago.   I could not get him to understand the covid questions so did not pursue it any further.  There were no appts at the [facility] so I have referred him to the urgent care at [facility] because he was familiar with [facility].  He was agreeable to going to the urgent care.   I made sure he knew where it was located at [facility].   "I will go to the urgent care at [facility]".    Reason for Disposition . [1] Hearing loss in one or both ears AND [2] sudden onset AND [3] present now  Answer Assessment - Initial Assessment Questions 1. DESCRIPTION: "What type of hearing problem are you having? Describe it for me." (e.g., complete hearing loss, partial loss)     This morning  I got up and have lost 80% of my hearing.   Last night I was watchingTV and I couldn't hear it.   I started my car this morning and I could not hear it.    I feel like I'm talking inside my head. 2. LOCATION: "One or both ears?" If one, ask: "Which ear?"     Right ear is messed up.   Left ear ok.   I'm having a hard time hearing. 3. SEVERITY: "Can you hear anything?" If Yes, ask: "What can you hear?" (e.g., ticking watch, whisper,  talking)     Only 10% of hearing. 4. ONSET: "When did this begin?" "Did it start suddenly or come on gradually?"     Last night 5. PATTERN: "Does this come and go, or has it been constant since it started?"     Constant 6. PAIN: "Is there any pain in your ear(s)?"  (Scale 1-10; or mild, moderate, severe)     No pain.   I'm talking inside my head. 7. CAUSE: "What do you think is causing this hearing problem?"     No 8. OTHER SYMPTOMS: "Do you have any other symptoms?" (e.g., dizziness, ringing in ears)     No dizziness.   I had a tube put in my right ear.   Left ear I can't hear much. 9. PREGNANCY: "Is there any chance you are pregnant?" "When was your last menstrual period?"     N/A  Protocols used: HEARING LOSS OR CHANGE-A-AH

## 2020-09-19 ENCOUNTER — Other Ambulatory Visit: Payer: Self-pay | Admitting: Nurse Practitioner

## 2020-09-19 DIAGNOSIS — E785 Hyperlipidemia, unspecified: Secondary | ICD-10-CM

## 2020-09-19 MED FILL — ATORVASTATIN CALCIUM 40 MG: 40 | 90 days supply | Qty: 90 | Fill #0

## 2020-10-06 ENCOUNTER — Other Ambulatory Visit: Payer: Self-pay | Admitting: Nurse Practitioner

## 2020-10-06 ENCOUNTER — Other Ambulatory Visit: Payer: Self-pay

## 2020-10-06 ENCOUNTER — Ambulatory Visit: Payer: Medicare HMO | Attending: Nurse Practitioner | Admitting: Nurse Practitioner

## 2020-10-06 ENCOUNTER — Encounter: Payer: Self-pay | Admitting: Nurse Practitioner

## 2020-10-06 DIAGNOSIS — U071 COVID-19: Secondary | ICD-10-CM | POA: Diagnosis not present

## 2020-10-06 MED ORDER — DEXTROMETHORPHAN-GUAIFENESIN 10-100 MG/5ML PO LIQD: 10.0000 mL | ORAL | 0 refills | Status: DC | PRN

## 2020-10-06 MED ORDER — ALBUTEROL SULFATE HFA 108 (90 BASE) MCG/ACT IN AERS: 1.0000 | INHALATION_SPRAY | Freq: Four times a day (QID) | RESPIRATORY_TRACT | 0 refills | Status: DC | PRN

## 2020-10-06 MED FILL — ALBUTEROL SULFATE HFA 108 (: 108 (90 BAS | 25 days supply | Qty: 18 | Fill #0

## 2020-10-06 NOTE — Progress Notes (Signed)
Virtual Visit via Telephone Note Due to national recommendations of social distancing due to [place] 19, telehealth visit is felt to be most appropriate for this patient at this time.  I discussed the limitations, risks, security and privacy concerns of performing an evaluation and management service by telephone and the availability of in person appointments. I also discussed with the patient that there may be a patient responsible charge related to this service. The patient expressed understanding and agreed to proceed.    I connected with Samuel Lawson on 10/06/20  at  10:50 AM EST  EDT by telephone and verified that I am speaking with the correct person using two identifiers.   Consent I discussed the limitations, risks, security and privacy concerns of performing an evaluation and management service by telephone and the availability of in person appointments. I also discussed with the patient that there may be a patient responsible charge related to this service. The patient expressed understanding and agreed to proceed.   Location of Patient: Private Residence   Location of Provider: [place] and [place] participating in Telemedicine visit: Geryl Rankins FNP-BC [facility]    History of Present Illness: Telemedicine visit for: COVID  Diagnosed at [facility] on 10-03-2019 with COVID. Current symptoms: Productive cough and chest congestion. He is a very heavy cigarette smoker (COPD) and high risk for complications.  Referred to [place] clinic. Denies fever, fatigue, chest pain.  He was has rcvd one dose of the Pfizer vaccine just recently 09-22-2019.    Past Medical History:  Diagnosis Date  . 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 Collagen vascular disease ([place])   . Hyperlipidemia   . Hypertension     Past Surgical History:  Procedure Laterality Date  . ANGIOPLASTY    . CARDIAC CATHETERIZATION      Family History  Problem  Relation Age of Onset  . Stroke Father   . Alzheimer's disease Father   . High blood pressure Father   . Heart Problems Mother     Social History   Socioeconomic History  . Marital status: Single    Spouse name: Not on file  . Number of children: 2  . Years of education: B.S.  . Highest education level: Not on file  Occupational History    Employer: SELF EMPLOYED    Comment: Computers - Works for [employer]  Tobacco Use  . Smoking status: Current Every Day Smoker    Packs/day: 1.00    Types: Cigarettes  . Smokeless tobacco: Never Used  . Tobacco comment: states new years resolution is to quit smoking  Vaping Use  . Vaping Use: Never used  Substance and Sexual Activity  . Alcohol use: No  . Drug use: No  . Sexual activity: Not on file  Other Topics Concern  . Not on file  Social History Narrative   Patient lives at home alone and he is single. Reports being married in the past. Patient is self employed and works with computers.   Education B.S.   Right handed.   Caffeine one cup of tea daily and two pepsi daily.   Social Determinants of Health   Financial Resource Strain: Not on file  Food Insecurity: Not on file  Transportation Needs: Not on file  Physical Activity: Not on file  Stress: Not on file  Social Connections: Not on file     Observations/Objective: Awake, alert and oriented x 3  Review of Systems  Constitutional: Negative for fever, malaise/fatigue and weight loss.  HENT: Positive for congestion. Negative for nosebleeds.   Eyes: Negative.  Negative for blurred vision, double vision and photophobia.  Respiratory: Positive for cough, sputum production and shortness of breath. Negative for wheezing.   Cardiovascular: Negative.  Negative for chest pain, palpitations and leg swelling.  Gastrointestinal: Negative.  Negative for heartburn, nausea and vomiting.  Musculoskeletal: Negative.  Negative for myalgias.  Neurological: Negative.  Negative for  dizziness, focal weakness, seizures and headaches.  Psychiatric/Behavioral: Negative.  Negative for suicidal ideas.    Assessment and Plan: Samuel Lawson was seen today for cough.  Diagnoses and all orders for this visit:  COVID-19 -     albuterol (VENTOLIN HFA) 108 (90 Base) MCG/ACT inhaler; Inhale 1-2 puffs into the lungs every 6 (six) hours as needed for wheezing or shortness of breath. Or persistent cough -     dextromethorphan-guaiFENesin (TUSSIN DM) 10-100 MG/5ML liquid; Take 10 mLs by mouth every 4 (four) hours as needed for cough.     Follow Up Instructions Return in about 1 month (around 11/06/2020).     I discussed the assessment and treatment plan with the patient. The patient was provided an opportunity to ask questions and all were answered. The patient agreed with the plan and demonstrated an understanding of the instructions.   The patient was advised to call back or seek an in-person evaluation if the symptoms worsen or if the condition fails to improve as anticipated.  I provided 13 minutes of non-face-to-face time during this encounter including median intraservice time, reviewing previous notes, labs, imaging, medications and explaining diagnosis and management.  Gildardo Pounds, FNP-BC

## 2020-10-20 MED FILL — CARVEDILOL 3.125 MG TABLET: 3.125 | 90 days supply | Qty: 180 | Fill #0

## 2020-10-24 ENCOUNTER — Other Ambulatory Visit: Payer: Self-pay

## 2020-10-24 ENCOUNTER — Ambulatory Visit: Payer: Medicare HMO | Attending: Nurse Practitioner | Admitting: Nurse Practitioner

## 2020-10-24 ENCOUNTER — Encounter: Payer: Self-pay | Admitting: Nurse Practitioner

## 2020-10-24 DIAGNOSIS — R7303 Prediabetes: Secondary | ICD-10-CM | POA: Diagnosis not present

## 2020-10-24 DIAGNOSIS — I10 Essential (primary) hypertension: Secondary | ICD-10-CM

## 2020-10-24 DIAGNOSIS — H9201 Otalgia, right ear: Secondary | ICD-10-CM | POA: Diagnosis not present

## 2020-10-24 DIAGNOSIS — E785 Hyperlipidemia, unspecified: Secondary | ICD-10-CM | POA: Diagnosis not present

## 2020-10-24 DIAGNOSIS — I2510 Atherosclerotic heart disease of native coronary artery without angina pectoris: Secondary | ICD-10-CM | POA: Diagnosis not present

## 2020-10-24 NOTE — Progress Notes (Signed)
Virtual Visit via Telephone Note Due to national recommendations of social distancing due to [place] 19, telehealth visit is felt to be most appropriate for this patient at this time.  I discussed the limitations, risks, security and privacy concerns of performing an evaluation and management service by telephone and the availability of in person appointments. I also discussed with the patient that there may be a patient responsible charge related to this service. The patient expressed understanding and agreed to proceed.    I connected with Genevie Cheshire on 10/24/20  at   4:10 PM EST  EDT by telephone and verified that I am speaking with the correct person using two identifiers.   Consent I discussed the limitations, risks, security and privacy concerns of performing an evaluation and management service by telephone and the availability of in person appointments. I also discussed with the patient that there may be a patient responsible charge related to this service. The patient expressed understanding and agreed to proceed.   Location of Patient: Private Residence   Location of Provider: [place] and [place] participating in Telemedicine visit: Geryl Rankins FNP-BC [place]    History of Present Illness: Telemedicine visit for: Follow up  has a past medical history of Acute MI ([place]) (2009), Collagen vascular disease ([place]), Hyperlipidemia, and Prediabetes    Prediabetes Well controlled with diet only at this time. LDL at goal with atorvastatin 40 mg daily.  Lab Results  Component Value Date   HGBA1C 5.3 07/24/2020   Lab Results  Component Value Date   LDLCALC 54 01/07/2020    CAD Blood pressure is well controlled. He is taking carvedilol 3.125 mg BID as prescribed. Denies chest pain, shortness of breath, palpitations, lightheadedness, dizziness, headaches or BLE edema.  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 He had tympanostomy tube placement last year which significantly helped to relieve his symptoms of vertigo.  He was lost to follow up and now has complaints of right ear pain with itching and discomfort. Would like to have his ear examimed by ENT as he believes the Tympanostomy tube may be misplaced in the inner ear Past Medical History:  Diagnosis Date  . 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 Collagen vascular disease ([place])   . Hyperlipidemia   . Hypertension     Past Surgical History:  Procedure Laterality Date  . ANGIOPLASTY    . CARDIAC CATHETERIZATION      Family History  Problem Relation Age of Onset  . Stroke Father   . Alzheimer's disease Father   . High blood pressure Father   . Heart Problems Mother     Social History   Socioeconomic History  . Marital status: Single    Spouse name: Not on file  . Number of children: 2  . Years of education: B.S.  . Highest education level: Not on file  Occupational History    Employer: SELF EMPLOYED    Comment: Computers - Works for [employer]  Tobacco Use  . Smoking status: Current Every Day Smoker    Packs/day: 1.00    Types: Cigarettes  . Smokeless tobacco: Never Used  . Tobacco comment: states new years resolution is to quit smoking  Vaping Use  . Vaping Use:  Never used  Substance and Sexual Activity  . Alcohol use: No  . Drug use: No  . Sexual activity: Not on file  Other Topics Concern  . Not on file  Social History Narrative   Patient lives at home alone and he is single. Reports being married in the past. Patient is self employed and works with computers.   Education B.S.   Right handed.   Caffeine one cup of tea daily and two pepsi daily.   Social Determinants of Health   Financial Resource Strain: Not on file  Food Insecurity: Not on file   Transportation Needs: Not on file  Physical Activity: Not on file  Stress: Not on file  Social Connections: Not on file     Observations/Objective: Awake, alert and oriented x 3   Review of Systems  Constitutional: Negative for fever, malaise/fatigue and weight loss.  HENT: Positive for ear pain and tinnitus. Negative for hearing loss, nosebleeds and sinus pain.   Eyes: Negative.  Negative for blurred vision, double vision and photophobia.  Respiratory: Negative.  Negative for cough, shortness of breath and stridor.   Cardiovascular: Negative.  Negative for chest pain, palpitations and leg swelling.  Gastrointestinal: Negative.  Negative for heartburn, nausea and vomiting.  Musculoskeletal: Negative.  Negative for myalgias.  Neurological: Negative.  Negative for dizziness, focal weakness, seizures and headaches.  Psychiatric/Behavioral: Negative.  Negative for suicidal ideas.    Assessment and Plan: Cristiano was seen today for ankle pain.  Diagnoses and all orders for this visit:  Prediabetes -     Accu-Chek Softclix Lancets lancets; Use as instructed. Inject into the skin once daily R73.03  Hyperlipidemia LDL goal <100 -     atorvastatin (LIPITOR) 40 MG tablet; Take 1 tablet (40 mg total) by mouth at bedtime.  Right ear pain -     Ambulatory referral to ENT  Coronary artery disease involving native coronary artery of native heart without angina pectoris -     carvedilol (COREG) 3.125 MG tablet; Take 1 tablet (3.125 mg total) by mouth 2 (two) times daily with a meal.     Follow Up Instructions Return in about 3 months (around 01/21/2021).     I discussed the assessment and treatment plan with the patient. The patient was provided an opportunity to ask questions and all were answered. The patient agreed with the plan and demonstrated an understanding of the instructions.   The patient was advised to call back or seek an in-person evaluation if the symptoms worsen or if  the condition fails to improve as anticipated.  I provided 14 minutes of non-face-to-face time during this encounter including median intraservice time, reviewing previous notes, labs, imaging, medications and explaining diagnosis and management.  Gildardo Pounds, FNP-BC

## 2020-10-30 ENCOUNTER — Encounter: Payer: Self-pay | Admitting: Nurse Practitioner

## 2020-10-30 ENCOUNTER — Other Ambulatory Visit: Payer: Self-pay | Admitting: Nurse Practitioner

## 2020-10-30 MED ORDER — CARVEDILOL 3.125 MG PO TABS: 3.1250 mg | ORAL_TABLET | Freq: Two times a day (BID) | ORAL | 1 refills | Status: DC

## 2020-10-30 MED ORDER — ACCU-CHEK SOFTCLIX LANCETS MISC: 3 refills | Status: DC

## 2020-10-30 MED ORDER — ATORVASTATIN CALCIUM 40 MG PO TABS: 40.0000 mg | ORAL_TABLET | Freq: Every day | ORAL | 0 refills | Status: DC

## 2020-10-31 MED FILL — ACCU-CHEK SOFTCLIX LANCETS: 90 days supply | Qty: 100 | Fill #0

## 2020-11-01 ENCOUNTER — Other Ambulatory Visit: Payer: Self-pay | Admitting: Nurse Practitioner

## 2020-11-01 ENCOUNTER — Other Ambulatory Visit: Payer: Self-pay

## 2020-11-01 ENCOUNTER — Ambulatory Visit: Payer: Medicare HMO | Attending: Nurse Practitioner

## 2020-11-01 DIAGNOSIS — I10 Essential (primary) hypertension: Secondary | ICD-10-CM

## 2020-11-01 DIAGNOSIS — D696 Thrombocytopenia, unspecified: Secondary | ICD-10-CM

## 2020-11-01 DIAGNOSIS — E559 Vitamin D deficiency, unspecified: Secondary | ICD-10-CM

## 2020-11-02 LAB — CBC
Hematocrit: 46.4 % (ref 37.5–51.0)
Hemoglobin: 15.6 g/dL (ref 13.0–17.7)
MCH: 31.5 pg (ref 26.6–33.0)
MCHC: 33.6 g/dL (ref 31.5–35.7)
MCV: 94 fL (ref 79–97)
Platelets: 129 10*3/uL — ABNORMAL LOW (ref 150–450)
RBC: 4.96 x10E6/uL (ref 4.14–5.80)
RDW: 12.2 % (ref 11.6–15.4)
WBC: 7.5 10*3/uL (ref 3.4–10.8)

## 2020-11-02 LAB — BASIC METABOLIC PANEL
BUN/Creatinine Ratio: 16 (ref 10–24)
BUN: 20 mg/dL (ref 8–27)
CO2: 22 mmol/L (ref 20–29)
Calcium: 9.4 mg/dL (ref 8.6–10.2)
Chloride: 103 mmol/L (ref 96–106)
Creatinine, Ser: 1.23 mg/dL (ref 0.76–1.27)
GFR calc Af Amer: 67 mL/min/{1.73_m2} (ref >59)
GFR calc non Af Amer: 58 mL/min/{1.73_m2} — ABNORMAL LOW (ref >59)
Glucose: 98 mg/dL (ref 65–99)
Potassium: 4.4 mmol/L (ref 3.5–5.2)
Sodium: 140 mmol/L (ref 134–144)

## 2020-11-02 LAB — VITAMIN D 25 HYDROXY (VIT D DEFICIENCY, FRACTURES): Vit D, 25-Hydroxy: 17.6 ng/mL — ABNORMAL LOW (ref 30.0–100.0)

## 2020-11-05 ENCOUNTER — Other Ambulatory Visit: Payer: Self-pay | Admitting: Nurse Practitioner

## 2020-11-05 MED ORDER — VITAMIN D (ERGOCALCIFEROL) 1.25 MG (50000 UNIT) PO CAPS: 50000.0000 [IU] | ORAL_CAPSULE | ORAL | 1 refills | Status: DC

## 2020-11-06 ENCOUNTER — Other Ambulatory Visit: Payer: Self-pay | Admitting: Nurse Practitioner

## 2020-11-06 DIAGNOSIS — N400 Enlarged prostate without lower urinary tract symptoms: Secondary | ICD-10-CM

## 2020-11-06 MED FILL — TAMSULOSIN HCL 0.4 MG CAP: 0.4 | 90 days supply | Qty: 90 | Fill #0

## 2020-11-09 MED FILL — OMEGA-3-ACID ETHYL ESTERS 1: 1 | 90 days supply | Qty: 180 | Fill #1

## 2020-12-20 ENCOUNTER — Other Ambulatory Visit: Payer: Self-pay | Admitting: Nurse Practitioner

## 2020-12-20 NOTE — Telephone Encounter (Signed)
Requested medications are due for refill today.  yes  Requested medications are on the active medications list.  yes  Last refill. 07/24/2020  Future visit scheduled.   no  Notes to clinic.  Pt has not been seen.

## 2020-12-25 ENCOUNTER — Other Ambulatory Visit: Payer: Self-pay

## 2020-12-25 ENCOUNTER — Other Ambulatory Visit: Payer: Self-pay | Admitting: Nurse Practitioner

## 2020-12-25 NOTE — Telephone Encounter (Signed)
Requested medication (s) are due for refill today: yes  Requested medication (s) are on the active medication list: yes  Last refill:  09/19/2020  Future visit scheduled: yes  Notes to clinic: review for refill Looks like may be 2 charts   Requested Prescriptions  Pending Prescriptions Disp Refills   atorvastatin (LIPITOR) 40 MG tablet 90 tablet 0    Sig: TAKE 1 TABLET (40 MG TOTAL) BY MOUTH AT BEDTIME.      Cardiovascular:  Antilipid - Statins Failed - 12/25/2020  9:24 AM      Failed - LDL in normal range and within 360 days    LDL Chol Calc (NIH)  Date Value Ref Range Status  01/07/2020 54 0 - 99 mg/dL Final          Passed - Total Cholesterol in normal range and within 360 days    Cholesterol, Total  Date Value Ref Range Status  01/07/2020 117 100 - 199 mg/dL Final          Passed - HDL in normal range and within 360 days    HDL  Date Value Ref Range Status  01/07/2020 45 >39 mg/dL Final          Passed - Triglycerides in normal range and within 360 days    Triglycerides  Date Value Ref Range Status  01/07/2020 92 0 - 149 mg/dL Final          Passed - Patient is not pregnant      Passed - Valid encounter within last 12 months    Recent Outpatient Visits           2 months ago Prediabetes   [place], Vernia Buff, NP   2 months ago [place], Vernia Buff, NP   5 months ago Prediabetes   [place], RPH-CPP   5 months ago Essential hypertension   [place], Vernia Buff, NP   8 months ago Essential hypertension   [place], Vernia Buff, NP       Future Appointments             In 4 weeks Gildardo Pounds, NP [place]

## 2020-12-26 ENCOUNTER — Other Ambulatory Visit: Payer: Self-pay

## 2020-12-26 MED ORDER — ATORVASTATIN CALCIUM 40 MG PO TABS
ORAL_TABLET | Freq: Every day | ORAL | 0 refills | Status: DC
  Filled 2020-12-26: qty 90, 90d supply, fill #0

## 2020-12-27 ENCOUNTER — Other Ambulatory Visit: Payer: Self-pay

## 2020-12-28 DIAGNOSIS — Z6830 Body mass index (BMI) 30.0-30.9, adult: Secondary | ICD-10-CM | POA: Diagnosis not present

## 2020-12-28 DIAGNOSIS — E119 Type 2 diabetes mellitus without complications: Secondary | ICD-10-CM | POA: Diagnosis not present

## 2020-12-28 DIAGNOSIS — E785 Hyperlipidemia, unspecified: Secondary | ICD-10-CM | POA: Diagnosis not present

## 2020-12-28 DIAGNOSIS — F17210 Nicotine dependence, cigarettes, uncomplicated: Secondary | ICD-10-CM | POA: Diagnosis not present

## 2020-12-28 DIAGNOSIS — E669 Obesity, unspecified: Secondary | ICD-10-CM | POA: Diagnosis not present

## 2020-12-28 DIAGNOSIS — I10 Essential (primary) hypertension: Secondary | ICD-10-CM | POA: Diagnosis not present

## 2020-12-28 DIAGNOSIS — R0902 Hypoxemia: Secondary | ICD-10-CM | POA: Diagnosis not present

## 2020-12-28 DIAGNOSIS — N400 Enlarged prostate without lower urinary tract symptoms: Secondary | ICD-10-CM | POA: Diagnosis not present

## 2021-01-17 MED FILL — Carvedilol Tab 3.125 MG: ORAL | 90 days supply | Qty: 180 | Fill #0 | Status: AC

## 2021-01-18 ENCOUNTER — Other Ambulatory Visit: Payer: Self-pay

## 2021-01-19 ENCOUNTER — Other Ambulatory Visit: Payer: Self-pay

## 2021-01-22 ENCOUNTER — Other Ambulatory Visit: Payer: Self-pay

## 2021-01-22 ENCOUNTER — Encounter: Payer: Self-pay | Admitting: Nurse Practitioner

## 2021-01-22 ENCOUNTER — Ambulatory Visit: Payer: Medicare HMO | Attending: Nurse Practitioner | Admitting: Nurse Practitioner

## 2021-01-22 VITALS — BP 117/72 | HR 69 | Resp 16

## 2021-01-22 DIAGNOSIS — Z4589 Encounter for adjustment and management of other implanted devices: Secondary | ICD-10-CM | POA: Diagnosis not present

## 2021-01-22 DIAGNOSIS — R7303 Prediabetes: Secondary | ICD-10-CM | POA: Diagnosis not present

## 2021-01-22 DIAGNOSIS — K219 Gastro-esophageal reflux disease without esophagitis: Secondary | ICD-10-CM

## 2021-01-22 DIAGNOSIS — I2510 Atherosclerotic heart disease of native coronary artery without angina pectoris: Secondary | ICD-10-CM | POA: Diagnosis not present

## 2021-01-22 DIAGNOSIS — E785 Hyperlipidemia, unspecified: Secondary | ICD-10-CM | POA: Diagnosis not present

## 2021-01-22 DIAGNOSIS — D696 Thrombocytopenia, unspecified: Secondary | ICD-10-CM | POA: Diagnosis not present

## 2021-01-22 MED ORDER — ASPIRIN 81 MG PO TBEC
81.0000 mg | DELAYED_RELEASE_TABLET | Freq: Every day | ORAL | 3 refills | Status: AC
  Filled 2021-01-22: qty 30, 30d supply, fill #0

## 2021-01-22 MED ORDER — ATORVASTATIN CALCIUM 40 MG PO TABS
ORAL_TABLET | Freq: Every day | ORAL | 0 refills | Status: DC
  Filled 2021-01-22: qty 90, fill #0
  Filled 2021-03-23: qty 90, 90d supply, fill #0

## 2021-01-22 MED ORDER — OMEPRAZOLE 40 MG PO CPDR
40.0000 mg | DELAYED_RELEASE_CAPSULE | Freq: Every day | ORAL | 3 refills | Status: DC
  Filled 2021-01-22: qty 30, 30d supply, fill #0

## 2021-01-22 MED ORDER — CARVEDILOL 3.125 MG PO TABS
3.1250 mg | ORAL_TABLET | Freq: Two times a day (BID) | ORAL | 1 refills | Status: DC
  Filled 2021-01-22 – 2021-04-23 (×2): qty 180, 90d supply, fill #0
  Filled 2021-07-27: qty 180, 90d supply, fill #1

## 2021-01-22 MED ORDER — FLUTICASONE PROPIONATE 50 MCG/ACT NA SUSP
2.0000 | Freq: Every day | NASAL | 6 refills | Status: DC
  Filled 2021-01-22: qty 16, 30d supply, fill #0

## 2021-01-22 NOTE — Progress Notes (Signed)
My ears are popping  Throat Stomach- acid - refill on omeprazole 40 mg  Vitamin D and vitamin K contraindication

## 2021-01-22 NOTE — Progress Notes (Signed)
Assessment & Plan:  Samuel Lawson was seen today for follow-up.  Diagnoses and all orders for this visit:  Prediabetes -     Hemoglobin A1c  Dyslipidemia, goal LDL below 70 -     Lipid panel -     atorvastatin (LIPITOR) 40 MG tablet; TAKE 1 TABLET (40 MG TOTAL) BY MOUTH AT BEDTIME. INSTRUCTIONS: Work on a low fat, heart healthy diet and participate in regular aerobic exercise program by working out at least 150 minutes per week; 5 days a week-30 minutes per day. Avoid red meat/beef/steak,  fried foods. junk foods, sodas, sugary drinks, unhealthy snacking, alcohol and smoking.  Drink at least 80 oz of water per day and monitor your carbohydrate intake daily.   Thrombocytopenia (HCC) -     CBC with Differential May need to dc ASA if worsenes  Coronary artery disease involving native coronary artery of native heart without angina pectoris -     carvedilol (COREG) 3.125 MG tablet; Take 1 tablet (3.125 mg total) by mouth 2 (two) times daily with a meal. -     aspirin (ASPIRIN 81) 81 MG EC tablet; Take 1 tablet (81 mg total) by mouth daily.  Tympanostomy tube check -     fluticasone (FLONASE) 50 MCG/ACT nasal spray; Place 2 sprays into both nostrils daily. -     Ambulatory referral to ENT  GERD without esophagitis -     omeprazole (PRILOSEC) 40 MG capsule; Take 1 capsule (40 mg total) by mouth daily. INSTRUCTIONS: Avoid GERD Triggers: acidic, spicy or fried foods, caffeine, coffee, sodas,  alcohol and chocolate.   Patient has been counseled on age-appropriate routine health concerns for screening and prevention. These are reviewed and up-to-date. Referrals have been placed accordingly. Immunizations are up-to-date or declined.    Subjective:   Chief Complaint  Patient presents with  . Follow-up   HPI Boe Deans 75 y.o. male presents to office today for follow up.   ENT States his Ears "pop" when he is eating. Associated symptoms: nasal congestion, sinus pressure, Rhinorrhea. He  does have a history of Bilateral eustachian tube dysfunction and R tympanostomy tube placement to help relieve symptoms of vertigo.  He was instructed to follow up with ENT last October however it appears he was lost to follow up. I am unable to view the tympanostomy tube today due to excessive cerumen in the ear canal.   Prediabetes Well controlled with diet only at this time.  Lab Results  Component Value Date   HGBA1C 5.6 01/22/2021   Essential Hypertension Well controlled with carvedilol 3.125 mg BID. Denies chest pain, shortness of breath, palpitations, lightheadedness, dizziness, headaches or BLE edema.  BP Readings from Last 3 Encounters:  01/22/21 117/72  08/07/20 132/65  07/24/20 109/71   GERD Failed zantac/ranitidine. Will fill omeprazole today.   Review of Systems  Constitutional: Negative for fever, malaise/fatigue and weight loss.  HENT: Positive for congestion, hearing loss and sinus pain. Negative for nosebleeds.   Eyes: Negative.  Negative for blurred vision, double vision and photophobia.  Respiratory: Negative.  Negative for cough and shortness of breath.   Cardiovascular: Negative.  Negative for chest pain, palpitations and leg swelling.  Gastrointestinal: Positive for heartburn. Negative for nausea and vomiting.  Musculoskeletal: Negative.  Negative for myalgias.  Neurological: Negative.  Negative for dizziness, focal weakness, seizures and headaches.  Psychiatric/Behavioral: Negative.  Negative for suicidal ideas.    Past Medical History:  Diagnosis Date  . Acute MI [facility]) 2009  STENTS PLACED  . Collagen vascular disease ([place])   . Hyperlipidemia   . Hypertension     Past Surgical History:  Procedure Laterality Date  . ANGIOPLASTY    . CARDIAC CATHETERIZATION      Family History  Problem Relation Age of Onset  . Stroke Father   . Alzheimer's disease Father   . High blood pressure Father   . Heart Problems Mother     Social History Reviewed  with no changes to be made today.   Outpatient Medications Prior to Visit  Medication Sig Dispense Refill  . ciclopirox (PENLAC) 8 % solution APPLY TOPICALLY AT BEDTIME OVER NAIL AND SURROUNDING SKIN. APPLY DAILY OVER PREVIOUS COAT. AFTER SEVEN (7) DAYS, MAY REMOVE WITH ALCOHOL AND CONTINUE CYCLE. 6.6 mL 3  . omega-3 acid ethyl esters (LOVAZA) 1 g capsule Take 1 capsule (1 g total) by mouth 2 (two) times daily. 180 capsule 1  . tamsulosin (FLOMAX) 0.4 MG CAPS capsule TAKE 1 CAPSULE (0.4 MG TOTAL) BY MOUTH DAILY. 90 capsule 0  . albuterol (VENTOLIN HFA) 108 (90 Base) MCG/ACT inhaler INHALE 1-2 PUFFS INTO THE LUNGS EVERY 6 (SIX) HOURS AS NEEDED FOR WHEEZING OR SHORTNESS OF BREATH. OR PERSISTENT COUGH 18 g 0  . ASPIRIN 81 PO Take 81 mg by mouth daily.    Marland Kitchen atorvastatin (LIPITOR) 40 MG tablet TAKE 1 TABLET (40 MG TOTAL) BY MOUTH AT BEDTIME. 90 tablet 0  . carvedilol (COREG) 3.125 MG tablet Take 1 tablet (3.125 mg total) by mouth 2 (two) times daily with a meal. 180 tablet 1  . carvedilol (COREG) 3.125 MG tablet TAKE 1 TABLET (3.125 MG TOTAL) BY MOUTH 2 (TWO) TIMES DAILY WITH A MEAL. 180 tablet 1  . Accu-Chek Softclix Lancets lancets USE AS INSTRUCTED. INJECT INTO THE SKIN ONCE DAILY R73.03 100 each 3  . albuterol (VENTOLIN HFA) 108 (90 Base) MCG/ACT inhaler Inhale 1-2 puffs into the lungs every 6 (six) hours as needed for wheezing or shortness of breath. Or persistent cough 18 g 0  . Blood Glucose Calibration (ACCU-CHEK GUIDE CONTROL) LIQD 1 each by In Vitro route once as needed for up to 1 dose. R73.03 1 each 0  . glucose blood (ACCU-CHEK GUIDE) test strip Use as instructed. Check blood glucose by fingerstick once per day. R73.03 100 each 12  . meclizine (ANTIVERT) 25 MG tablet TAKE 1 TABLET (25 MG TOTAL) BY MOUTH 3 (THREE) TIMES DAILY AS NEEDED FOR DIZZINESS. 60 tablet 1  . Vitamin D, Ergocalciferol, (DRISDOL) 1.25 MG (50000 UNIT) CAPS capsule Take 1 capsule (50,000 Units total) by mouth every 7  (seven) days. 12 capsule 1  . Accu-Chek Softclix Lancets lancets Use as instructed. Inject into the skin once daily R73.03 100 each 3  . carvedilol (COREG) 3.125 MG tablet TAKE 1 TABLET (3.125 MG TOTAL) BY MOUTH 2 (TWO) TIMES DAILY WITH A MEAL. 180 tablet 1  . carvedilol (COREG) 3.125 MG tablet TAKE 1 TABLET (3.125 MG TOTAL) BY MOUTH 2 (TWO) TIMES DAILY WITH A MEAL. 180 tablet 1  . ciclopirox (PENLAC) 8 % solution Apply topically at bedtime. Apply over nail and surrounding skin. Apply daily over previous coat. After seven (7) days, may remove with alcohol and continue cycle. 6.6 mL 3  . dextromethorphan-guaiFENesin (TUSSIN DM) 10-100 MG/5ML liquid Take 10 mLs by mouth every 4 (four) hours as needed for cough. 480 mL 0  . meclizine (ANTIVERT) 25 MG tablet Take 1 tablet (25 mg total) by mouth 3 (three) times  daily as needed for dizziness. 60 tablet 1  . omega-3 acid ethyl esters (LOVAZA) 1 g capsule TAKE 1 CAPSULE (1 G TOTAL) BY MOUTH 2 (TWO) TIMES DAILY. 180 capsule 1  . tamsulosin (FLOMAX) 0.4 MG CAPS capsule TAKE 1 CAPSULE (0.4 MG TOTAL) BY MOUTH DAILY. 90 capsule 0  . tamsulosin (FLOMAX) 0.4 MG CAPS capsule TAKE 1 CAPSULE (0.4 MG TOTAL) BY MOUTH DAILY. 30 capsule 2  . Vitamin D, Ergocalciferol, (DRISDOL) 1.25 MG (50000 UNIT) CAPS capsule TAKE 1 CAPSULE (50,000 UNITS TOTAL) BY MOUTH EVERY 7 (SEVEN) DAYS. 12 capsule 1  . Vitamin D, Ergocalciferol, (DRISDOL) 1.25 MG (50000 UNIT) CAPS capsule TAKE 1 CAPSULE (50,000 UNITS TOTAL) BY MOUTH EVERY 7 (SEVEN) DAYS. 12 capsule 1   No facility-administered medications prior to visit.    No Known Allergies     Objective:    BP 117/72   Pulse 69   Resp 16   SpO2 97%  Wt Readings from Last 3 Encounters:  08/07/20 181 lb (82.1 kg)  07/24/20 182 lb 6.4 oz (82.7 kg)  07/06/20 184 lb (83.5 kg)    Physical Exam Vitals and nursing note reviewed.  Constitutional:      Appearance: He is well-developed.  HENT:     Head: Normocephalic and atraumatic.   Cardiovascular:     Rate and Rhythm: Normal rate and regular rhythm.     Heart sounds: Normal heart sounds. No murmur heard. No friction rub. No gallop.   Pulmonary:     Effort: Pulmonary effort is normal. No tachypnea or respiratory distress.     Breath sounds: Normal breath sounds. No decreased breath sounds, wheezing, rhonchi or rales.  Chest:     Chest wall: No tenderness.  Abdominal:     General: Bowel sounds are normal.     Palpations: Abdomen is soft.  Musculoskeletal:        General: Normal range of motion.     Cervical back: Normal range of motion.  Skin:    General: Skin is warm and dry.  Neurological:     Mental Status: He is alert and oriented to person, place, and time.     Coordination: Coordination normal.  Psychiatric:        Behavior: Behavior normal. Behavior is cooperative.        Thought Content: Thought content normal.        Judgment: Judgment normal.          Patient has been counseled extensively about nutrition and exercise as well as the importance of adherence with medications and regular follow-up. The patient was given clear instructions to go to ER or return to medical center if symptoms don't improve, worsen or new problems develop. The patient verbalized understanding.   Follow-up: Return in about 3 months (around 04/24/2021).   Gildardo Pounds, FNP-BC [facility], [place]   01/25/2021, 11:36 AM

## 2021-01-23 ENCOUNTER — Other Ambulatory Visit: Payer: Self-pay

## 2021-01-23 LAB — CBC WITH DIFFERENTIAL/PLATELET
Basophils Absolute: 0 10*3/uL (ref 0.0–0.2)
Basos: 1 %
EOS (ABSOLUTE): 0.2 10*3/uL (ref 0.0–0.4)
Eos: 3 %
Hematocrit: 47.4 % (ref 37.5–51.0)
Hemoglobin: 15.7 g/dL (ref 13.0–17.7)
Immature Grans (Abs): 0 10*3/uL (ref 0.0–0.1)
Immature Granulocytes: 0 %
Lymphocytes Absolute: 2 10*3/uL (ref 0.7–3.1)
Lymphs: 24 %
MCH: 31.5 pg (ref 26.6–33.0)
MCHC: 33.1 g/dL (ref 31.5–35.7)
MCV: 95 fL (ref 79–97)
Monocytes Absolute: 0.8 10*3/uL (ref 0.1–0.9)
Monocytes: 10 %
Neutrophils Absolute: 5.3 10*3/uL (ref 1.4–7.0)
Neutrophils: 62 %
Platelets: 139 10*3/uL — ABNORMAL LOW (ref 150–450)
RBC: 4.99 x10E6/uL (ref 4.14–5.80)
RDW: 12.2 % (ref 11.6–15.4)
WBC: 8.3 10*3/uL (ref 3.4–10.8)

## 2021-01-23 LAB — LIPID PANEL
Chol/HDL Ratio: 2.4 ratio (ref 0.0–5.0)
Cholesterol, Total: 131 mg/dL (ref 100–199)
HDL: 54 mg/dL (ref >39)
LDL Chol Calc (NIH): 49 mg/dL (ref 0–99)
Triglycerides: 166 mg/dL — ABNORMAL HIGH (ref 0–149)
VLDL Cholesterol Cal: 28 mg/dL (ref 5–40)

## 2021-01-23 LAB — HEMOGLOBIN A1C
Est. average glucose Bld gHb Est-mCnc: 114 mg/dL
Hgb A1c MFr Bld: 5.6 % (ref 4.8–5.6)

## 2021-01-24 ENCOUNTER — Other Ambulatory Visit: Payer: Self-pay

## 2021-01-25 ENCOUNTER — Encounter: Payer: Self-pay | Admitting: Nurse Practitioner

## 2021-01-25 DIAGNOSIS — R0989 Other specified symptoms and signs involving the circulatory and respiratory systems: Secondary | ICD-10-CM | POA: Diagnosis not present

## 2021-01-25 DIAGNOSIS — F17210 Nicotine dependence, cigarettes, uncomplicated: Secondary | ICD-10-CM | POA: Diagnosis not present

## 2021-01-25 DIAGNOSIS — Z8669 Personal history of other diseases of the nervous system and sense organs: Secondary | ICD-10-CM | POA: Diagnosis not present

## 2021-01-25 DIAGNOSIS — H90A22 Sensorineural hearing loss, unilateral, left ear, with restricted hearing on the contralateral side: Secondary | ICD-10-CM | POA: Diagnosis not present

## 2021-01-25 DIAGNOSIS — H6121 Impacted cerumen, right ear: Secondary | ICD-10-CM | POA: Diagnosis not present

## 2021-01-25 DIAGNOSIS — H903 Sensorineural hearing loss, bilateral: Secondary | ICD-10-CM | POA: Diagnosis not present

## 2021-01-25 DIAGNOSIS — H90A31 Mixed conductive and sensorineural hearing loss, unilateral, right ear with restricted hearing on the contralateral side: Secondary | ICD-10-CM | POA: Diagnosis not present

## 2021-01-25 DIAGNOSIS — Z9622 Myringotomy tube(s) status: Secondary | ICD-10-CM | POA: Diagnosis not present

## 2021-02-05 ENCOUNTER — Other Ambulatory Visit: Payer: Self-pay | Admitting: Nurse Practitioner

## 2021-02-06 ENCOUNTER — Other Ambulatory Visit: Payer: Self-pay

## 2021-02-06 MED ORDER — TAMSULOSIN HCL 0.4 MG PO CAPS
ORAL_CAPSULE | Freq: Every day | ORAL | 0 refills | Status: DC
  Filled 2021-02-06: qty 90, 90d supply, fill #0

## 2021-02-08 ENCOUNTER — Other Ambulatory Visit: Payer: Self-pay

## 2021-02-13 ENCOUNTER — Other Ambulatory Visit: Payer: Self-pay

## 2021-02-16 ENCOUNTER — Other Ambulatory Visit: Payer: Self-pay | Admitting: Nurse Practitioner

## 2021-02-16 ENCOUNTER — Other Ambulatory Visit: Payer: Self-pay

## 2021-02-16 MED ORDER — OMEGA-3-ACID ETHYL ESTERS 1 G PO CAPS
ORAL_CAPSULE | ORAL | 1 refills | Status: DC
  Filled 2021-02-16: qty 180, 90d supply, fill #0
  Filled 2021-05-25: qty 180, 90d supply, fill #1

## 2021-02-19 ENCOUNTER — Other Ambulatory Visit: Payer: Self-pay

## 2021-03-23 ENCOUNTER — Other Ambulatory Visit: Payer: Self-pay

## 2021-03-26 ENCOUNTER — Other Ambulatory Visit: Payer: Self-pay

## 2021-03-29 ENCOUNTER — Other Ambulatory Visit: Payer: Self-pay

## 2021-03-29 MED FILL — Ciclopirox Solution 8%: CUTANEOUS | 25 days supply | Qty: 6.6 | Fill #0 | Status: AC

## 2021-04-23 ENCOUNTER — Other Ambulatory Visit: Payer: Self-pay

## 2021-05-08 ENCOUNTER — Other Ambulatory Visit: Payer: Self-pay | Admitting: Nurse Practitioner

## 2021-05-08 ENCOUNTER — Other Ambulatory Visit: Payer: Self-pay

## 2021-05-08 MED ORDER — TAMSULOSIN HCL 0.4 MG PO CAPS
ORAL_CAPSULE | Freq: Every day | ORAL | 0 refills | Status: DC
  Filled 2021-05-08: qty 90, 90d supply, fill #0

## 2021-05-08 NOTE — Telephone Encounter (Signed)
Patient will need an office visit for further refill. Requested Prescriptions  Pending Prescriptions Disp Refills  . tamsulosin (FLOMAX) 0.4 MG CAPS capsule 90 capsule 0    Sig: TAKE 1 CAPSULE (0.4 MG TOTAL) BY MOUTH DAILY.     Urology: Alpha-Adrenergic Blocker Passed - 05/08/2021  1:42 PM      Passed - Last BP in normal range    BP Readings from Last 1 Encounters:  01/22/21 117/72         Passed - Valid encounter within last 12 months    Recent Outpatient Visits          3 months ago Prediabetes   [place], Vernia Buff, NP   6 months ago Prediabetes   [place], Vernia Buff, NP   7 months ago [place], Vernia Buff, NP   9 months ago Prediabetes   [place], RPH-CPP   9 months ago Essential hypertension   [place], Vernia Buff, NP

## 2021-05-09 ENCOUNTER — Other Ambulatory Visit: Payer: Self-pay

## 2021-05-11 ENCOUNTER — Other Ambulatory Visit: Payer: Self-pay

## 2021-05-12 ENCOUNTER — Telehealth: Payer: Self-pay

## 2021-05-12 NOTE — Telephone Encounter (Signed)
Contacted pt today and patient did not want to get scheduled for AWV with me. Pt would like to have AWV done in office because of possible scam (Scam has happen before per pt). Please have nurse contact patient to assist with scheduling in office.

## 2021-05-25 ENCOUNTER — Other Ambulatory Visit: Payer: Self-pay

## 2021-05-28 ENCOUNTER — Other Ambulatory Visit: Payer: Self-pay

## 2021-06-25 ENCOUNTER — Other Ambulatory Visit: Payer: Self-pay | Admitting: Nurse Practitioner

## 2021-06-25 ENCOUNTER — Other Ambulatory Visit: Payer: Self-pay

## 2021-06-25 DIAGNOSIS — E785 Hyperlipidemia, unspecified: Secondary | ICD-10-CM

## 2021-06-25 MED ORDER — ATORVASTATIN CALCIUM 40 MG PO TABS
ORAL_TABLET | Freq: Every day | ORAL | 2 refills | Status: DC
  Filled 2021-06-25: qty 30, 30d supply, fill #0
  Filled 2021-07-24: qty 30, 30d supply, fill #1
  Filled 2021-07-24: qty 90, 90d supply, fill #1
  Filled 2021-10-19: qty 90, 90d supply, fill #0
  Filled 2021-10-19: qty 90, 90d supply, fill #2
  Filled 2022-01-24: qty 30, 30d supply, fill #1
  Filled 2022-01-31: qty 60, 60d supply, fill #1

## 2021-06-25 NOTE — Telephone Encounter (Signed)
Requested Prescriptions  Pending Prescriptions Disp Refills  . atorvastatin (LIPITOR) 40 MG tablet 90 tablet 2    Sig: TAKE 1 TABLET (40 MG TOTAL) BY MOUTH AT BEDTIME.     Cardiovascular:  Antilipid - Statins Failed - 06/25/2021  1:02 PM      Failed - Triglycerides in normal range and within 360 days    Triglycerides  Date Value Ref Range Status  01/22/2021 166 (H) 0 - 149 mg/dL Final         Passed - Total Cholesterol in normal range and within 360 days    Cholesterol, Total  Date Value Ref Range Status  01/22/2021 131 100 - 199 mg/dL Final         Passed - LDL in normal range and within 360 days    LDL Chol Calc (NIH)  Date Value Ref Range Status  01/22/2021 49 0 - 99 mg/dL Final         Passed - HDL in normal range and within 360 days    HDL  Date Value Ref Range Status  01/22/2021 54 >39 mg/dL Final         Passed - Patient is not pregnant      Passed - Valid encounter within last 12 months    Recent Outpatient Visits          5 months ago Prediabetes   [place], Vernia Buff, NP   8 months ago Prediabetes   [place], Vernia Buff, NP   8 months ago [place], Vernia Buff, NP   11 months ago Prediabetes   [place], RPH-CPP   11 months ago Essential hypertension   [place], Vernia Buff, NP

## 2021-06-26 ENCOUNTER — Other Ambulatory Visit: Payer: Self-pay

## 2021-06-28 ENCOUNTER — Other Ambulatory Visit: Payer: Self-pay

## 2021-07-24 ENCOUNTER — Other Ambulatory Visit: Payer: Self-pay

## 2021-07-27 ENCOUNTER — Other Ambulatory Visit: Payer: Self-pay

## 2021-08-03 ENCOUNTER — Other Ambulatory Visit: Payer: Self-pay | Admitting: Nurse Practitioner

## 2021-08-04 NOTE — Telephone Encounter (Signed)
Requested medication (s) are due for refill today:   Yes  Requested medication (s) are on the active medication list:   Yes  Future visit scheduled:   No He has been notified to make an appt.     Last ordered: 05/08/2021 #90, 0 refills  Returned for provider to review for refill.   Passes protocol for refills however there is a note that he needs an appt for refills.  He hasn't called in for an appt so far.     Requested Prescriptions  Pending Prescriptions Disp Refills   tamsulosin (FLOMAX) 0.4 MG CAPS capsule 90 capsule 0    Sig: TAKE 1 CAPSULE (0.4 MG TOTAL) BY MOUTH DAILY.     Urology: Alpha-Adrenergic Blocker Passed - 08/03/2021  1:22 PM      Passed - Last BP in normal range    BP Readings from Last 1 Encounters:  01/22/21 117/72          Passed - Valid encounter within last 12 months    Recent Outpatient Visits           6 months ago Prediabetes   [place], Vernia Buff, NP   9 months ago Prediabetes   [place], Vernia Buff, NP   10 months ago [place], Vernia Buff, NP   1 year ago Prediabetes   [place], RPH-CPP   1 year ago Essential hypertension   [place], Vernia Buff, NP

## 2021-08-06 ENCOUNTER — Other Ambulatory Visit: Payer: Self-pay

## 2021-08-06 ENCOUNTER — Other Ambulatory Visit: Payer: Self-pay | Admitting: Nurse Practitioner

## 2021-08-06 NOTE — Telephone Encounter (Signed)
Requested medication (s) are due for refill today: Due 08/08/21  Requested medication (s) are on the active medication list: yes  Last refill: 05/08/21 #90  0 refills  Future visit scheduled no  Notes to clinic: Pt was to have appt 8/22. Attempted to reach to schedule,unable to leave VM asks for "Password."  Please review.  Requested Prescriptions  Pending Prescriptions Disp Refills   tamsulosin (FLOMAX) 0.4 MG CAPS capsule 90 capsule 0    Sig: TAKE 1 CAPSULE (0.4 MG TOTAL) BY MOUTH DAILY.     Urology: Alpha-Adrenergic Blocker Passed - 08/06/2021  8:40 AM      Passed - Last BP in normal range    BP Readings from Last 1 Encounters:  01/22/21 117/72          Passed - Valid encounter within last 12 months    Recent Outpatient Visits           6 months ago Prediabetes   [place], Vernia Buff, NP   9 months ago Prediabetes   [place], Vernia Buff, NP   10 months ago [place], Vernia Buff, NP   1 year ago Prediabetes   [place], RPH-CPP   1 year ago Essential hypertension   [place], Zelda W, NP

## 2021-08-07 ENCOUNTER — Other Ambulatory Visit: Payer: Self-pay

## 2021-08-08 ENCOUNTER — Other Ambulatory Visit: Payer: Self-pay | Admitting: Nurse Practitioner

## 2021-08-08 ENCOUNTER — Other Ambulatory Visit: Payer: Self-pay

## 2021-08-08 ENCOUNTER — Ambulatory Visit: Payer: Medicare HMO | Attending: Nurse Practitioner | Admitting: Nurse Practitioner

## 2021-08-08 ENCOUNTER — Encounter: Payer: Self-pay | Admitting: Nurse Practitioner

## 2021-08-08 VITALS — BP 128/85 | HR 66 | Ht 67.0 in | Wt 188.4 lb

## 2021-08-08 DIAGNOSIS — Z122 Encounter for screening for malignant neoplasm of respiratory organs: Secondary | ICD-10-CM

## 2021-08-08 DIAGNOSIS — D696 Thrombocytopenia, unspecified: Secondary | ICD-10-CM

## 2021-08-08 DIAGNOSIS — I10 Essential (primary) hypertension: Secondary | ICD-10-CM

## 2021-08-08 DIAGNOSIS — N401 Enlarged prostate with lower urinary tract symptoms: Secondary | ICD-10-CM

## 2021-08-08 DIAGNOSIS — Z1211 Encounter for screening for malignant neoplasm of colon: Secondary | ICD-10-CM

## 2021-08-08 DIAGNOSIS — E559 Vitamin D deficiency, unspecified: Secondary | ICD-10-CM

## 2021-08-08 DIAGNOSIS — R413 Other amnesia: Secondary | ICD-10-CM

## 2021-08-08 DIAGNOSIS — R351 Nocturia: Secondary | ICD-10-CM

## 2021-08-08 DIAGNOSIS — R7303 Prediabetes: Secondary | ICD-10-CM

## 2021-08-08 DIAGNOSIS — Z820 Family history of epilepsy and other diseases of the nervous system: Secondary | ICD-10-CM

## 2021-08-08 DIAGNOSIS — R195 Other fecal abnormalities: Secondary | ICD-10-CM

## 2021-08-08 DIAGNOSIS — I2510 Atherosclerotic heart disease of native coronary artery without angina pectoris: Secondary | ICD-10-CM

## 2021-08-08 LAB — POCT GLYCOSYLATED HEMOGLOBIN (HGB A1C): Hemoglobin A1C: 5.8 % — AB (ref 4.0–5.6)

## 2021-08-08 LAB — GLUCOSE, POCT (MANUAL RESULT ENTRY): POC Glucose: 134 mg/dl — AB (ref 70–99)

## 2021-08-08 MED ORDER — CARVEDILOL 3.125 MG PO TABS
3.1250 mg | ORAL_TABLET | Freq: Two times a day (BID) | ORAL | 1 refills | Status: DC
  Filled 2021-08-08 – 2021-10-19 (×3): qty 180, 90d supply, fill #0
  Filled 2022-01-24: qty 180, 90d supply, fill #1

## 2021-08-08 MED ORDER — ASPIRIN EC 81 MG PO TBEC
81.0000 mg | DELAYED_RELEASE_TABLET | Freq: Every day | ORAL | 3 refills | Status: AC
  Filled 2021-08-08: qty 90, 90d supply, fill #0

## 2021-08-08 MED ORDER — TAMSULOSIN HCL 0.4 MG PO CAPS
ORAL_CAPSULE | Freq: Every day | ORAL | 1 refills | Status: AC
  Filled 2021-08-08: qty 90, 90d supply, fill #0

## 2021-08-08 MED ORDER — VITAMIN D (ERGOCALCIFEROL) 1.25 MG (50000 UNIT) PO CAPS
50000.0000 [IU] | ORAL_CAPSULE | ORAL | 1 refills | Status: DC
  Filled 2021-08-08: qty 4, 28d supply, fill #0

## 2021-08-08 NOTE — Progress Notes (Signed)
Assessment & Plan:  Samuel Lawson was seen today for diabetes.  Diagnoses and all orders for this visit:  Prediabetes -     POCT glycosylated hemoglobin (Hb A1C) -     POCT glucose (manual entry)  Primary hypertension Continue all antihypertensives as prescribed.  Remember to bring in your blood pressure log with you for your follow up appointment.  DASH/Mediterranean Diets are healthier choices for HTN.    Patient has been counseled on age-appropriate routine health concerns for screening and prevention. These are reviewed and up-to-date. Referrals have been placed accordingly. Immunizations are up-to-date or declined.    Subjective:   Chief Complaint  Patient presents with   Diabetes   HPI Samuel Lawson 76 y.o. male presents to office today for follow up to Prediabetes He has a past medical history of Acute MI (2009), Collagen vascular disease, Hyperlipidemia, Hypertension, and Prediabetes.   Prediabetes Well controlled with diet only at this time.  Lab Results  Component Value Date   HGBA1C 5.8 (A) 08/08/2021     HTN Slightly elevated today. He does endorse compliance taking coreg 3.125 mg BID. He continues to smoke up to 1ppd.  BP Readings from Last 3 Encounters:  08/08/21 128/85  01/22/21 117/72  08/07/20 132/65     Review of Systems  Constitutional:  Negative for fever, malaise/fatigue and weight loss.  HENT: Negative.  Negative for nosebleeds.   Eyes: Negative.  Negative for blurred vision, double vision and photophobia.  Respiratory: Negative.  Negative for cough and shortness of breath.   Cardiovascular: Negative.  Negative for chest pain, palpitations and leg swelling.  Gastrointestinal: Negative.  Negative for heartburn, nausea and vomiting.  Musculoskeletal: Negative.  Negative for myalgias.  Neurological: Negative.  Negative for dizziness, focal weakness, seizures and headaches.  Psychiatric/Behavioral: Negative.  Negative for suicidal ideas.    Past  Medical History:  Diagnosis Date   Acute MI [facility]) 2009   STENTS PLACED   Collagen vascular disease ([place])    Hyperlipidemia    Hypertension    Prediabetes     Past Surgical History:  Procedure Laterality Date   ANGIOPLASTY     CARDIAC CATHETERIZATION      Family History  Problem Relation Age of Onset   Stroke Father    Alzheimer's disease Father    High blood pressure Father    Heart Problems Mother     Social History Reviewed with no changes to be made today.   Outpatient Medications Prior to Visit  Medication Sig Dispense Refill   Accu-Chek Softclix Lancets lancets USE AS INSTRUCTED. INJECT INTO THE SKIN ONCE DAILY R73.03 100 each 3   atorvastatin (LIPITOR) 40 MG tablet TAKE 1 TABLET (40 MG TOTAL) BY MOUTH AT BEDTIME. 90 tablet 2   Blood Glucose Calibration (ACCU-CHEK GUIDE CONTROL) LIQD 1 each by In Vitro route once as needed for up to 1 dose. R73.03 1 each 0   fluticasone (FLONASE) 50 MCG/ACT nasal spray Place 2 sprays into both nostrils daily. 18 g 6   glucose blood (ACCU-CHEK GUIDE) test strip Use as instructed. Check blood glucose by fingerstick once per day. R73.03 100 each 12   omega-3 acid ethyl esters (LOVAZA) 1 g capsule Take 1 capsule (1 g total) by mouth 2 (two) times daily. 180 capsule 1   carvedilol (COREG) 3.125 MG tablet Take 1 tablet (3.125 mg total) by mouth 2 (two) times daily with a meal. 180 tablet 1   tamsulosin (FLOMAX) 0.4 MG CAPS capsule TAKE  1 CAPSULE (0.4 MG TOTAL) BY MOUTH DAILY. 90 capsule 0   Vitamin D, Ergocalciferol, (DRISDOL) 1.25 MG (50000 UNIT) CAPS capsule Take 1 capsule (50,000 Units total) by mouth every 7 (seven) days. 12 capsule 1   albuterol (VENTOLIN HFA) 108 (90 Base) MCG/ACT inhaler Inhale 1-2 puffs into the lungs every 6 (six) hours as needed for wheezing or shortness of breath. Or persistent cough (Patient not taking: Reported on 08/08/2021) 18 g 0   omega-3 acid ethyl esters (LOVAZA) 1 g capsule TAKE 1 CAPSULE (1 G TOTAL) BY MOUTH  2 (TWO) TIMES DAILY. (Patient not taking: Reported on 08/08/2021) 180 capsule 1   omeprazole (PRILOSEC) 40 MG capsule Take 1 capsule (40 mg total) by mouth daily. (Patient not taking: Reported on 08/08/2021) 30 capsule 3   No facility-administered medications prior to visit.    No Known Allergies     Objective:    BP 128/85   Pulse 66   Ht 5\' 7"  (1.702 m)   Wt 188 lb 6 oz (85.4 kg)   SpO2 99%   BMI 29.50 kg/m  Wt Readings from Last 3 Encounters:  08/08/21 188 lb 6 oz (85.4 kg)  08/07/20 181 lb (82.1 kg)  07/24/20 182 lb 6.4 oz (82.7 kg)    Physical Exam Vitals and nursing note reviewed.  Constitutional:      Appearance: He is well-developed.  HENT:     Head: Normocephalic and atraumatic.  Cardiovascular:     Rate and Rhythm: Normal rate and regular rhythm.     Heart sounds: Normal heart sounds. No murmur heard.   No friction rub. No gallop.  Pulmonary:     Effort: Pulmonary effort is normal. No tachypnea or respiratory distress.     Breath sounds: Normal breath sounds. No decreased breath sounds, wheezing, rhonchi or rales.  Chest:     Chest wall: No tenderness.  Abdominal:     General: Bowel sounds are normal.     Palpations: Abdomen is soft.  Musculoskeletal:        General: Normal range of motion.     Cervical back: Normal range of motion.  Skin:    General: Skin is warm and dry.  Neurological:     Mental Status: He is alert and oriented to person, place, and time.     Coordination: Coordination normal.  Psychiatric:        Behavior: Behavior normal. Behavior is cooperative.        Thought Content: Thought content normal.        Judgment: Judgment normal.         Patient has been counseled extensively about nutrition and exercise as well as the importance of adherence with medications and regular follow-up. The patient was given clear instructions to go to ER or return to medical center if symptoms don't improve, worsen or new problems develop. The  patient verbalized understanding.   Follow-up: Return in about 3 months (around 11/06/2021).   Samuel Pounds, FNP-BC [facility] and [place], [place]   08/08/2021, 5:21 PM

## 2021-08-08 NOTE — Progress Notes (Unsigned)
Assessment & Plan:  Diagnoses and all orders for this visit:  Prediabetes  Colon cancer screening -     Ambulatory referral to Gastroenterology  Positive fecal occult blood test -     Ambulatory referral to Gastroenterology  Benign prostatic hyperplasia with nocturia -     Ambulatory referral to Urology   Patient has been counseled on age-appropriate routine health concerns for screening and prevention. These are reviewed and up-to-date. Referrals have been placed accordingly. Immunizations are up-to-date or declined.    Subjective:  No chief complaint on file.  HPI [place] Lawson 75 y.o. male presents to office today for follow up.  He does eat a lot of bread  He feels like he is forgetful Hard to recall words.  Dad had alzheimers disease  Smoking 25 1 ppd  ROS  Past Medical History:  Diagnosis Date   Acute MI ([place]) 2009   STENTS PLACED   Collagen vascular disease ([place])    Hyperlipidemia    Hypertension    Prediabetes     Past Surgical History:  Procedure Laterality Date   ANGIOPLASTY     CARDIAC CATHETERIZATION      Family History  Problem Relation Age of Onset   Stroke Father    Alzheimer's disease Father    High blood pressure Father    Heart Problems Mother     Social History Reviewed with no changes to be made today.   Outpatient Medications Prior to Visit  Medication Sig Dispense Refill   Accu-Chek Softclix Lancets lancets USE AS INSTRUCTED. INJECT INTO THE SKIN ONCE DAILY R73.03 100 each 3   albuterol (VENTOLIN HFA) 108 (90 Base) MCG/ACT inhaler Inhale 1-2 puffs into the lungs every 6 (six) hours as needed for wheezing or shortness of breath. Or persistent cough (Patient not taking: Reported on 08/08/2021) 18 g 0   atorvastatin (LIPITOR) 40 MG tablet TAKE 1 TABLET (40 MG TOTAL) BY MOUTH AT BEDTIME. 90 tablet 2   Blood Glucose Calibration (ACCU-CHEK GUIDE CONTROL) LIQD 1 each by In Vitro route once as needed for up to 1 dose. R73.03 1 each 0    carvedilol (COREG) 3.125 MG tablet Take 1 tablet (3.125 mg total) by mouth 2 (two) times daily with a meal. 180 tablet 1   fluticasone (FLONASE) 50 MCG/ACT nasal spray Place 2 sprays into both nostrils daily. 18 g 6   glucose blood (ACCU-CHEK GUIDE) test strip Use as instructed. Check blood glucose by fingerstick once per day. R73.03 100 each 12   omega-3 acid ethyl esters (LOVAZA) 1 g capsule Take 1 capsule (1 g total) by mouth 2 (two) times daily. 180 capsule 1   omega-3 acid ethyl esters (LOVAZA) 1 g capsule TAKE 1 CAPSULE (1 G TOTAL) BY MOUTH 2 (TWO) TIMES DAILY. (Patient not taking: Reported on 08/08/2021) 180 capsule 1   omeprazole (PRILOSEC) 40 MG capsule Take 1 capsule (40 mg total) by mouth daily. (Patient not taking: Reported on 08/08/2021) 30 capsule 3   tamsulosin (FLOMAX) 0.4 MG CAPS capsule TAKE 1 CAPSULE (0.4 MG TOTAL) BY MOUTH DAILY. 90 capsule 0   Vitamin D, Ergocalciferol, (DRISDOL) 1.25 MG (50000 UNIT) CAPS capsule Take 1 capsule (50,000 Units total) by mouth every 7 (seven) days. 12 capsule 1   No facility-administered medications prior to visit.    No Known Allergies     Objective:    There were no vitals taken for this visit. Wt Readings from Last 3 Encounters:  08/08/21 188 lb  6 oz (85.4 kg)  08/07/20 181 lb (82.1 kg)  07/24/20 182 lb 6.4 oz (82.7 kg)    Physical Exam       Patient has been counseled extensively about nutrition and exercise as well as the importance of adherence with medications and regular follow-up. The patient was given clear instructions to go to ER or return to medical center if symptoms don't improve, worsen or new problems develop. The patient verbalized understanding.   Follow-up: No follow-ups on file.   Samuel Pounds, FNP-BC [facility] and [facility] [place], [place]   08/08/2021, 10:03 AM

## 2021-08-09 LAB — CMP14+EGFR
ALT: 20 IU/L (ref 0–44)
AST: 23 IU/L (ref 0–40)
Albumin/Globulin Ratio: 2.4 — ABNORMAL HIGH (ref 1.2–2.2)
Albumin: 5 g/dL — ABNORMAL HIGH (ref 3.7–4.7)
Alkaline Phosphatase: 77 IU/L (ref 44–121)
BUN/Creatinine Ratio: 13 (ref 10–24)
BUN: 13 mg/dL (ref 8–27)
Bilirubin Total: 1.2 mg/dL (ref 0.0–1.2)
CO2: 26 mmol/L (ref 20–29)
Calcium: 9.3 mg/dL (ref 8.6–10.2)
Chloride: 100 mmol/L (ref 96–106)
Creatinine, Ser: 1.04 mg/dL (ref 0.76–1.27)
Globulin, Total: 2.1 g/dL (ref 1.5–4.5)
Glucose: 111 mg/dL — ABNORMAL HIGH (ref 70–99)
Potassium: 4.3 mmol/L (ref 3.5–5.2)
Sodium: 141 mmol/L (ref 134–144)
Total Protein: 7.1 g/dL (ref 6.0–8.5)
eGFR: 76 mL/min/{1.73_m2} (ref >59)

## 2021-08-09 LAB — CBC WITH DIFFERENTIAL/PLATELET
Basophils Absolute: 0 10*3/uL (ref 0.0–0.2)
Basos: 0 %
EOS (ABSOLUTE): 0.1 10*3/uL (ref 0.0–0.4)
Eos: 2 %
Hematocrit: 47.2 % (ref 37.5–51.0)
Hemoglobin: 15.9 g/dL (ref 13.0–17.7)
Immature Grans (Abs): 0 10*3/uL (ref 0.0–0.1)
Immature Granulocytes: 1 %
Lymphocytes Absolute: 1.7 10*3/uL (ref 0.7–3.1)
Lymphs: 25 %
MCH: 31.4 pg (ref 26.6–33.0)
MCHC: 33.7 g/dL (ref 31.5–35.7)
MCV: 93 fL (ref 79–97)
Monocytes Absolute: 0.7 10*3/uL (ref 0.1–0.9)
Monocytes: 10 %
Neutrophils Absolute: 4.2 10*3/uL (ref 1.4–7.0)
Neutrophils: 62 %
Platelets: 130 10*3/uL — ABNORMAL LOW (ref 150–450)
RBC: 5.07 x10E6/uL (ref 4.14–5.80)
RDW: 11.8 % (ref 11.6–15.4)
WBC: 6.7 10*3/uL (ref 3.4–10.8)

## 2021-08-09 LAB — VITAMIN D 25 HYDROXY (VIT D DEFICIENCY, FRACTURES): Vit D, 25-Hydroxy: 42.4 ng/mL (ref 30.0–100.0)

## 2021-08-10 ENCOUNTER — Telehealth: Payer: Self-pay

## 2021-08-10 NOTE — Telephone Encounter (Signed)
-----   Message from Gildardo Pounds, NP sent at 08/09/2021  6:19 PM EST ----- Vitamin D level is normal.  Negative for anemia. Platelets low but do not require additional work up at this time.  Kidney, liver function and electrolytes are normal.

## 2021-08-10 NOTE — Telephone Encounter (Signed)
Called patient reviewed all information and repeated back to me. Will call if any questions.  ? ?

## 2021-08-15 ENCOUNTER — Other Ambulatory Visit: Payer: Self-pay

## 2021-08-21 ENCOUNTER — Other Ambulatory Visit: Payer: Self-pay

## 2021-08-21 ENCOUNTER — Ambulatory Visit (HOSPITAL_COMMUNITY)
Admission: RE | Admit: 2021-08-21 | Discharge: 2021-08-21 | Disposition: A | Discharge status: Home / Self Care | Payer: Medicare HMO | Source: Ambulatory Visit | Attending: Nurse Practitioner | Admitting: Nurse Practitioner

## 2021-08-21 DIAGNOSIS — J439 Emphysema, unspecified: Secondary | ICD-10-CM | POA: Insufficient documentation

## 2021-08-21 DIAGNOSIS — K8020 Calculus of gallbladder without cholecystitis without obstruction: Secondary | ICD-10-CM | POA: Insufficient documentation

## 2021-08-21 DIAGNOSIS — I700 Atherosclerosis of aorta: Secondary | ICD-10-CM | POA: Diagnosis not present

## 2021-08-21 DIAGNOSIS — F17210 Nicotine dependence, cigarettes, uncomplicated: Secondary | ICD-10-CM | POA: Diagnosis not present

## 2021-08-21 DIAGNOSIS — Z122 Encounter for screening for malignant neoplasm of respiratory organs: Secondary | ICD-10-CM | POA: Diagnosis not present

## 2021-08-21 DIAGNOSIS — I2510 Atherosclerotic heart disease of native coronary artery without angina pectoris: Secondary | ICD-10-CM | POA: Insufficient documentation

## 2021-08-24 ENCOUNTER — Other Ambulatory Visit: Payer: Self-pay | Admitting: Nurse Practitioner

## 2021-08-24 DIAGNOSIS — N2889 Other specified disorders of kidney and ureter: Secondary | ICD-10-CM

## 2021-08-27 ENCOUNTER — Telehealth: Payer: Self-pay

## 2021-08-27 NOTE — Telephone Encounter (Signed)
Called patient reviewed all information and repeated back to me. Will call if any questions.  Made Ct appt for Wednesday Dec. 28 @4  pm at [facility].

## 2021-08-27 NOTE — Telephone Encounter (Signed)
-----   Message from Gildardo Pounds, NP sent at 08/24/2021  9:22 PM EST ----- CT does not show any suspicious lung nodules. Repeat in 1 year. 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 Continue cholesterol medication as prescribed. STOP SMOKING. 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 Samuel Lawson please schedule. Thanks!

## 2021-08-30 ENCOUNTER — Other Ambulatory Visit: Payer: Self-pay | Admitting: Nurse Practitioner

## 2021-08-30 ENCOUNTER — Other Ambulatory Visit: Payer: Self-pay

## 2021-08-30 ENCOUNTER — Telehealth: Payer: Self-pay

## 2021-08-30 MED ORDER — OMEGA-3-ACID ETHYL ESTERS 1 G PO CAPS
ORAL_CAPSULE | ORAL | 1 refills | Status: DC
  Filled 2021-08-30: qty 180, 90d supply, fill #0
  Filled 2021-12-01: qty 180, 90d supply, fill #1
  Filled 2021-12-03: qty 180, 90d supply, fill #0

## 2021-08-30 NOTE — Telephone Encounter (Signed)
-----   Message from Jackelyn Knife, [place] sent at 08/30/2021  9:59 AM EST ----- Regarding: FW: AUTHORIZATION REQUIRED  ----- Message ----- From: Wynonia Musty D Sent: 08/30/2021   9:50 AM EST To: Angelena Form, Algie Coffer, # Subject: AUTHORIZATION REQUIRED                         Good Morning,  Authorization is required for this patient's [facility] Medicare - not on file.  Can you please assist? Let me know if you need any other information.  Samuel Lawson MRN [id number] DOB 10-21-1945 DOS 09/05/2021  CT RENAL ABDOMEN W WO CONTRAST   Thank you,  Wynonia Musty

## 2021-08-30 NOTE — Telephone Encounter (Signed)
Auth # [id number]

## 2021-09-05 ENCOUNTER — Other Ambulatory Visit: Payer: Self-pay

## 2021-09-05 ENCOUNTER — Ambulatory Visit (HOSPITAL_COMMUNITY)
Admission: RE | Admit: 2021-09-05 | Discharge: 2021-09-05 | Disposition: A | Discharge status: Home / Self Care | Payer: Medicare HMO | Source: Ambulatory Visit | Attending: Nurse Practitioner | Admitting: Nurse Practitioner

## 2021-09-05 DIAGNOSIS — N2889 Other specified disorders of kidney and ureter: Secondary | ICD-10-CM | POA: Insufficient documentation

## 2021-09-05 DIAGNOSIS — K5730 Diverticulosis of large intestine without perforation or abscess without bleeding: Secondary | ICD-10-CM | POA: Diagnosis not present

## 2021-09-05 DIAGNOSIS — N281 Cyst of kidney, acquired: Secondary | ICD-10-CM | POA: Diagnosis not present

## 2021-09-05 DIAGNOSIS — K8020 Calculus of gallbladder without cholecystitis without obstruction: Secondary | ICD-10-CM | POA: Diagnosis not present

## 2021-09-05 MED ORDER — IOHEXOL 350 MG/ML SOLN
80.0000 mL | Freq: Once | INTRAVENOUS | Status: AC | PRN
  Administered 2021-09-05: 16:00:00 80 mL via INTRAVENOUS

## 2021-09-05 MED ORDER — SODIUM CHLORIDE (PF) 0.9 % IJ SOLN
INTRAMUSCULAR | Status: AC
  Filled 2021-09-05: qty 50

## 2021-09-12 ENCOUNTER — Other Ambulatory Visit: Payer: Self-pay | Admitting: Nurse Practitioner

## 2021-09-12 DIAGNOSIS — C642 Malignant neoplasm of left kidney, except renal pelvis: Secondary | ICD-10-CM

## 2021-09-18 ENCOUNTER — Other Ambulatory Visit (HOSPITAL_BASED_OUTPATIENT_CLINIC_OR_DEPARTMENT_OTHER): Payer: Self-pay

## 2021-09-18 DIAGNOSIS — R3912 Poor urinary stream: Secondary | ICD-10-CM | POA: Diagnosis not present

## 2021-09-18 DIAGNOSIS — R3911 Hesitancy of micturition: Secondary | ICD-10-CM | POA: Diagnosis not present

## 2021-09-18 DIAGNOSIS — R3914 Feeling of incomplete bladder emptying: Secondary | ICD-10-CM | POA: Diagnosis not present

## 2021-09-18 DIAGNOSIS — D49512 Neoplasm of unspecified behavior of left kidney: Secondary | ICD-10-CM | POA: Diagnosis not present

## 2021-09-18 MED ORDER — TAMSULOSIN HCL 0.4 MG PO CAPS
ORAL_CAPSULE | ORAL | 11 refills | Status: DC
  Filled 2021-09-18: qty 39, 20d supply, fill #0
  Filled 2021-09-18: qty 21, 10d supply, fill #0
  Filled 2021-09-21 – 2021-09-28 (×2): qty 60, 30d supply, fill #0
  Filled 2021-12-25 (×2): qty 60, 30d supply, fill #1
  Filled 2022-02-25: qty 60, 30d supply, fill #2
  Filled 2022-04-24: qty 60, 30d supply, fill #3
  Filled 2022-07-02: qty 60, 30d supply, fill #4
  Filled 2022-07-25: qty 60, 30d supply, fill #5
  Filled 2022-08-29: qty 60, 30d supply, fill #6

## 2021-09-21 ENCOUNTER — Other Ambulatory Visit: Payer: Self-pay

## 2021-09-21 ENCOUNTER — Other Ambulatory Visit (HOSPITAL_BASED_OUTPATIENT_CLINIC_OR_DEPARTMENT_OTHER): Payer: Self-pay

## 2021-09-27 ENCOUNTER — Other Ambulatory Visit: Payer: Self-pay

## 2021-09-28 ENCOUNTER — Other Ambulatory Visit: Payer: Self-pay

## 2021-10-04 ENCOUNTER — Encounter: Payer: Self-pay | Admitting: Nurse Practitioner

## 2021-10-11 DIAGNOSIS — D49512 Neoplasm of unspecified behavior of left kidney: Secondary | ICD-10-CM | POA: Diagnosis not present

## 2021-10-18 ENCOUNTER — Other Ambulatory Visit: Payer: Self-pay | Admitting: Urology

## 2021-10-19 ENCOUNTER — Other Ambulatory Visit: Payer: Self-pay

## 2021-10-22 ENCOUNTER — Other Ambulatory Visit: Payer: Self-pay | Admitting: Nurse Practitioner

## 2021-10-22 ENCOUNTER — Other Ambulatory Visit: Payer: Self-pay

## 2021-10-22 DIAGNOSIS — R7303 Prediabetes: Secondary | ICD-10-CM

## 2021-10-23 ENCOUNTER — Other Ambulatory Visit: Payer: Self-pay

## 2021-10-23 MED ORDER — ACCU-CHEK GUIDE VI STRP
ORAL_STRIP | 0 refills | Status: DC
  Filled 2021-10-23: qty 50, 50d supply, fill #0

## 2021-10-23 NOTE — Telephone Encounter (Signed)
Requested medication (s) are due for refill today - Expired Rx  Requested medication (s) are on the active medication list -yes  Future visit scheduled -no  Last refill: 01/07/20 #100 12RF  Notes to clinic: Request RF: expired Rx  Requested Prescriptions  Pending Prescriptions Disp Refills   glucose blood (ACCU-CHEK GUIDE) test strip 100 each 12    Sig: Use as instructed. Check blood glucose by fingerstick once per day. R73.03     Endocrinology: Diabetes - Testing Supplies Passed - 10/22/2021 11:20 AM      Passed - Valid encounter within last 12 months    Recent Outpatient Visits           2 months ago Prediabetes   [place], Vernia Buff, NP   9 months ago Prediabetes   [place], Vernia Buff, NP   12 months ago Prediabetes   [place], Vernia Buff, NP   1 year ago [place], Vernia Buff, NP   1 year ago Prediabetes   [place], RPH-CPP                 Requested Prescriptions  Pending Prescriptions Disp Refills   glucose blood (ACCU-CHEK GUIDE) test strip 100 each 12    Sig: Use as instructed. Check blood glucose by fingerstick once per day. R73.03     Endocrinology: Diabetes - Testing Supplies Passed - 10/22/2021 11:20 AM      Passed - Valid encounter within last 12 months    Recent Outpatient Visits           2 months ago Prediabetes   [place], Vernia Buff, NP   9 months ago Prediabetes   [place], Vernia Buff, NP   12 months ago [place], Vernia Buff, NP   1 year ago [place], Vernia Buff, NP   1 year ago [place], RPH-CPP

## 2021-10-24 ENCOUNTER — Other Ambulatory Visit: Payer: Self-pay

## 2021-10-29 ENCOUNTER — Encounter: Payer: Self-pay | Admitting: Nurse Practitioner

## 2021-10-29 ENCOUNTER — Other Ambulatory Visit: Payer: Self-pay | Admitting: Nurse Practitioner

## 2021-10-29 ENCOUNTER — Other Ambulatory Visit: Payer: Self-pay

## 2021-10-29 ENCOUNTER — Ambulatory Visit: Payer: Self-pay

## 2021-10-29 ENCOUNTER — Ambulatory Visit (INDEPENDENT_AMBULATORY_CARE_PROVIDER_SITE_OTHER): Payer: Medicare HMO | Admitting: Nurse Practitioner

## 2021-10-29 VITALS — BP 112/70 | HR 74 | Temp 98.2°F | Resp 16 | Wt 190.0 lb

## 2021-10-29 DIAGNOSIS — J069 Acute upper respiratory infection, unspecified: Secondary | ICD-10-CM

## 2021-10-29 MED ORDER — CETIRIZINE HCL 10 MG PO TABS
10.0000 mg | ORAL_TABLET | Freq: Every day | ORAL | 0 refills | Status: DC
  Filled 2021-10-29: qty 30, 30d supply, fill #0

## 2021-10-29 MED ORDER — AMOXICILLIN 875 MG PO TABS
875.0000 mg | ORAL_TABLET | Freq: Two times a day (BID) | ORAL | 0 refills | Status: AC
  Filled 2021-10-29: qty 20, 10d supply, fill #0

## 2021-10-29 NOTE — Progress Notes (Signed)
Pt appointment scheduled.

## 2021-10-29 NOTE — Progress Notes (Signed)
Patient is here with c/o runny nose x 2 days dripping like a water facet  Patient said that he has been coughing and the right side of head has been hurting. Patient denies taking OTC med's

## 2021-10-29 NOTE — Progress Notes (Signed)
@Patient  ID: Samuel Lawson, male    DOB: 02/13/1946, 76 y.o.   MRN: [id number]  Chief Complaint  Patient presents with   Sinusitis    Referring provider: Gildardo Pounds, NP  HPI  Patient presents today for sick visit.  Patient states that his symptoms started a few days ago.  He states that he has been having postnasal drip, cough, chest congestion.  He states that he is having sinus pressure and pain worse on the right side.  He states that he did take a home COVID test which was negative. Denies f/c/s, n/v/d, hemoptysis, PND, chest pain or edema.    No Known Allergies  Immunization History  Administered Date(s) Administered   Influenza,inj,Quad PF,6+ Mos 08/25/2013, 06/01/2014, 06/22/2015   PFIZER(Purple Top)SARS-COV-2 Vaccination 09/21/2020    Past Medical History:  Diagnosis Date   Acute MI [facility]) 2009   STENTS PLACED   Collagen vascular disease ([place])    Hyperlipidemia    Hypertension    Prediabetes     Tobacco History: Social History   Tobacco Use  Smoking Status Every Day   Packs/day: 1.00   Types: Cigarettes  Smokeless Tobacco Never  Tobacco Comments   states new years resolution is to quit smoking   Ready to quit: Not Answered Counseling given: Not Answered Tobacco comments: states new years resolution is to quit smoking   Outpatient Encounter Medications as of 10/29/2021  Medication Sig   [EXPIRED] Accu-Chek Softclix Lancets lancets USE AS INSTRUCTED. INJECT INTO THE SKIN ONCE DAILY R73.03   albuterol (VENTOLIN HFA) 108 (90 Base) MCG/ACT inhaler Inhale 1-2 puffs into the lungs every 6 (six) hours as needed for wheezing or shortness of breath. Or persistent cough   amoxicillin (AMOXIL) 875 MG tablet Take 1 tablet (875 mg total) by mouth 2 (two) times daily for 10 days.   aspirin EC 81 MG tablet Take 1 tablet (81 mg total) by mouth daily. Swallow whole.   atorvastatin (LIPITOR) 40 MG tablet TAKE 1 TABLET (40 MG TOTAL) BY MOUTH AT BEDTIME.   Blood  Glucose Calibration (ACCU-CHEK GUIDE CONTROL) LIQD 1 each by In Vitro route once as needed for up to 1 dose. R73.03   carvedilol (COREG) 3.125 MG tablet Take 1 tablet (3.125 mg total) by mouth 2 (two) times daily with a meal.   cetirizine (ZYRTEC) 10 MG tablet Take 1 tablet (10 mg total) by mouth daily.   fluticasone (FLONASE) 50 MCG/ACT nasal spray Place 2 sprays into both nostrils daily.   glucose blood (ACCU-CHEK GUIDE) test strip Use as instructed. Check blood glucose by fingerstick once per day. R73.03   omega-3 acid ethyl esters (LOVAZA) 1 g capsule Take 1 capsule (1 g total) by mouth 2 (two) times daily.   omega-3 acid ethyl esters (LOVAZA) 1 g capsule TAKE 1 CAPSULE (1 G TOTAL) BY MOUTH 2 (TWO) TIMES DAILY.   omeprazole (PRILOSEC) 40 MG capsule Take 1 capsule (40 mg total) by mouth daily.   tamsulosin (FLOMAX) 0.4 MG CAPS capsule TAKE 1 CAPSULE (0.4 MG TOTAL) BY MOUTH DAILY.   tamsulosin (FLOMAX) 0.4 MG CAPS capsule take 2 capsules by mouth daily   Vitamin D, Ergocalciferol, (DRISDOL) 1.25 MG (50000 UNIT) CAPS capsule Take 1 capsule (50,000 Units total) by mouth every 7 (seven) days.   [DISCONTINUED] promethazine (PHENERGAN) 25 MG tablet Take 1 tablet (25 mg total) by mouth every 6 (six) hours as needed for nausea.   No facility-administered encounter medications on file as of 10/29/2021.  Review of Systems  Review of Systems  Constitutional: Negative.   HENT:  Positive for congestion, postnasal drip, sinus pressure and sinus pain.   Respiratory:  Positive for cough. Negative for shortness of breath.        Chest congestion  Cardiovascular: Negative.   Gastrointestinal: Negative.   Allergic/Immunologic: Negative.   Neurological: Negative.   Psychiatric/Behavioral: Negative.        Physical Exam  BP 112/70    Pulse 74    Temp 98.2 F (36.8 C) (Oral)    Resp 16    Wt 190 lb (86.2 kg)    SpO2 94%    BMI 29.76 kg/m   Wt Readings from Last 5 Encounters:  10/29/21 190 lb  (86.2 kg)  08/08/21 188 lb 6 oz (85.4 kg)  08/07/20 181 lb (82.1 kg)  07/24/20 182 lb 6.4 oz (82.7 kg)  07/06/20 184 lb (83.5 kg)     Physical Exam Vitals and nursing note reviewed.  Constitutional:      General: He is not in acute distress.    Appearance: He is well-developed.  HENT:     Right Ear: Tympanic membrane normal.     Left Ear: Tympanic membrane normal.     Nose: Congestion present.  Cardiovascular:     Rate and Rhythm: Normal rate and regular rhythm.  Pulmonary:     Effort: Pulmonary effort is normal.     Breath sounds: Normal breath sounds.  Skin:    General: Skin is warm and dry.  Neurological:     Mental Status: He is alert and oriented to person, place, and time.     Lab Results:   Assessment & Plan:   Upper respiratory tract infection  Meds ordered this encounter  Medications   amoxicillin (AMOXIL) 875 MG tablet    Sig: Take 1 tablet (875 mg total) by mouth 2 (two) times daily for 10 days.    Dispense:  20 tablet    Refill:  0   cetirizine (ZYRTEC) 10 MG tablet    Sig: Take 1 tablet (10 mg total) by mouth daily.    Dispense:  30 tablet    Refill:  0    Stay well hydrated  Stay active  May take tylenol or fever or pain  May take mucinex twice daily    Follow up:  Follow up if needed     Fenton Foy, NP 10/31/2021

## 2021-10-29 NOTE — Patient Instructions (Addendum)
URI:  Meds ordered this encounter  Medications   amoxicillin (AMOXIL) 875 MG tablet    Sig: Take 1 tablet (875 mg total) by mouth 2 (two) times daily for 10 days.    Dispense:  20 tablet    Refill:  0   cetirizine (ZYRTEC) 10 MG tablet    Sig: Take 1 tablet (10 mg total) by mouth daily.    Dispense:  30 tablet    Refill:  0    Stay well hydrated  Stay active  May take tylenol or fever or pain  May take mucinex twice daily    Follow up:  Follow up if needed

## 2021-10-29 NOTE — Telephone Encounter (Signed)
The patient has experienced a runny nose for roughly two days   The patient has experienced a cough and significant drainage   The patient would like to be prescribed something for their discomfort   Please contact further when possible   The patient has tested negative for covid via a home test    Chief Complaint: Sinus pain, runny nose, cough. COVID 19 negative Symptoms: Above Frequency: Started this weekend Pertinent Negatives: Patient denies fever Disposition: [] ED /[] Urgent Care (no appt availability in office) / [] Appointment(In office/virtual)/ []  [place] [facility]/ [] Home Care/ [] Refused Recommended Disposition /[] [facility]/ []  Follow-up with PCP Additional Notes: Will go to [facility].   Answer Assessment - Initial Assessment Questions 1. LOCATION: "Where does it hurt?"      Headache 2. ONSET: "When did the sinus pain start?"  (e.g., hours, days)      2 days ago 3. SEVERITY: "How bad is the pain?"   (Scale 1-10; mild, moderate or severe)   - MILD (1-3): doesn't interfere with normal activities    - MODERATE (4-7): interferes with normal activities (e.g., work or school) or awakens from sleep   - SEVERE (8-10): excruciating pain and patient unable to do any normal activities        Moderate 4. RECURRENT SYMPTOM: "Have you ever had sinus problems before?" If Yes, ask: "When was the last time?" and "What happened that time?"      Yes 5. NASAL CONGESTION: "Is the nose blocked?" If Yes, ask: "Can you open it or must you breathe through your mouth?"     Nose running 6. NASAL DISCHARGE: "Do you have discharge from your nose?" If so ask, "What color?"     Clear  7. FEVER: "Do you have a fever?" If Yes, ask: "What is it, how was it measured, and when did it start?"      No 8. OTHER SYMPTOMS: "Do you have any other symptoms?" (e.g., sore throat, cough, earache, difficulty breathing)     Headache, cough 9. PREGNANCY: "Is there any chance you are  pregnant?" "When was your last menstrual period?"     No  Protocols used: Sinus Pain or Congestion-A-AH

## 2021-10-30 ENCOUNTER — Institutional Professional Consult (permissible substitution): Payer: Medicare HMO | Admitting: Neurology

## 2021-10-31 ENCOUNTER — Encounter: Payer: Self-pay | Admitting: Nurse Practitioner

## 2021-10-31 DIAGNOSIS — J069 Acute upper respiratory infection, unspecified: Secondary | ICD-10-CM | POA: Insufficient documentation

## 2021-10-31 NOTE — Assessment & Plan Note (Signed)
°  Meds ordered this encounter  Medications   amoxicillin (AMOXIL) 875 MG tablet    Sig: Take 1 tablet (875 mg total) by mouth 2 (two) times daily for 10 days.    Dispense:  20 tablet    Refill:  0   cetirizine (ZYRTEC) 10 MG tablet    Sig: Take 1 tablet (10 mg total) by mouth daily.    Dispense:  30 tablet    Refill:  0    Stay well hydrated  Stay active  May take tylenol or fever or pain  May take mucinex twice daily    Follow up:  Follow up if needed

## 2021-11-02 ENCOUNTER — Other Ambulatory Visit: Payer: Self-pay | Admitting: Nurse Practitioner

## 2021-11-02 ENCOUNTER — Ambulatory Visit: Payer: Medicare HMO | Attending: Nurse Practitioner

## 2021-11-02 DIAGNOSIS — D696 Thrombocytopenia, unspecified: Secondary | ICD-10-CM

## 2021-11-02 DIAGNOSIS — I10 Essential (primary) hypertension: Secondary | ICD-10-CM

## 2021-11-03 LAB — CMP14+EGFR
ALT: 16 IU/L (ref 0–44)
AST: 18 IU/L (ref 0–40)
Albumin/Globulin Ratio: 2.2 (ref 1.2–2.2)
Albumin: 4.9 g/dL — ABNORMAL HIGH (ref 3.7–4.7)
Alkaline Phosphatase: 68 IU/L (ref 44–121)
BUN/Creatinine Ratio: 15 (ref 10–24)
BUN: 16 mg/dL (ref 8–27)
Bilirubin Total: 0.6 mg/dL (ref 0.0–1.2)
CO2: 25 mmol/L (ref 20–29)
Calcium: 9.4 mg/dL (ref 8.6–10.2)
Chloride: 105 mmol/L (ref 96–106)
Creatinine, Ser: 1.05 mg/dL (ref 0.76–1.27)
Globulin, Total: 2.2 g/dL (ref 1.5–4.5)
Glucose: 112 mg/dL — ABNORMAL HIGH (ref 70–99)
Potassium: 4.4 mmol/L (ref 3.5–5.2)
Sodium: 144 mmol/L (ref 134–144)
Total Protein: 7.1 g/dL (ref 6.0–8.5)
eGFR: 75 mL/min/{1.73_m2} (ref >59)

## 2021-11-03 LAB — CBC
Hematocrit: 45.5 % (ref 37.5–51.0)
Hemoglobin: 15.4 g/dL (ref 13.0–17.7)
MCH: 31.5 pg (ref 26.6–33.0)
MCHC: 33.8 g/dL (ref 31.5–35.7)
MCV: 93 fL (ref 79–97)
Platelets: 145 10*3/uL — ABNORMAL LOW (ref 150–450)
RBC: 4.89 x10E6/uL (ref 4.14–5.80)
RDW: 12.1 % (ref 11.6–15.4)
WBC: 7 10*3/uL (ref 3.4–10.8)

## 2021-11-07 ENCOUNTER — Telehealth: Payer: Self-pay | Admitting: *Deleted

## 2021-11-07 ENCOUNTER — Telehealth: Payer: Self-pay

## 2021-11-07 NOTE — Telephone Encounter (Signed)
Result note read to patient, verbalizes understanding. ?

## 2021-11-07 NOTE — Telephone Encounter (Signed)
Left message to return call to our office.  

## 2021-11-08 ENCOUNTER — Telehealth: Payer: Self-pay

## 2021-11-08 NOTE — Telephone Encounter (Signed)
Called patient reviewed all information and repeated back to me. Will call if any questions.  ? ?

## 2021-11-21 NOTE — Patient Instructions (Signed)
DUE TO COVID-19 ONLY ONE VISITOR  (aged 74 and older)  IS ALLOWED TO COME WITH YOU AND STAY IN THE WAITING ROOM ONLY DURING PRE OP AND PROCEDURE.   ?**NO VISITORS ARE ALLOWED IN THE SHORT STAY AREA OR RECOVERY ROOM!!** ? ?IF YOU WILL BE ADMITTED INTO THE HOSPITAL YOU ARE ALLOWED ONLY TWO SUPPORT PEOPLE DURING VISITATION HOURS ONLY (7 AM -8PM)   ?The support person(s) must pass our screening, gel in and out, and wear a mask at all times, including in the patient?s room. ?Patients must also wear a mask when staff or their support person are in the room. ?Visitors GUEST BADGE MUST BE WORN VISIBLY  ?One adult visitor may remain with you overnight and MUST be in the room by 8 P.M. ?  ? ? Your procedure is scheduled on:  ? ? Report to [facility] Main Entrance ? ?  Report to admitting at AM ? ? Call this number if you have problems the morning of surgery [phone] ? ? CLEAR LIQUID DIET START THE DAY BEFORE SURGERY,UNTIL 4:30 AM THE MORNING OF SURGERY: ?Water ?Black Coffee (sugar ok, NO MILK/CREAM OR CREAMERS)  ?Tea (sugar ok, NO MILK/CREAM OR CREAMERS) regular and decaf                             ?Plain Jell-O (NO RED)                                           ?Fruit ices (not with fruit pulp, NO RED)                                     ?Popsicles (NO RED)                                                                  ?Juice: apple, WHITE grape, WHITE cranberry ?Sports drinks like Gatorade (NO RED) ?Clear broth(vegetable,chicken,beef) ? ?FOLLOW BOWEL PREP AND ANY ADDITIONAL PRE OP INSTRUCTIONS YOU RECEIVED FROM YOUR SURGEON'S OFFICE!! ? ?Oral Hygiene is also important to reduce your risk of infection.                                    ?Remember - BRUSH YOUR TEETH THE MORNING OF SURGERY WITH YOUR REGULAR TOOTHPASTE ? ? Do NOT smoke after Midnight ? ? Take these medicines the morning of surgery with A SIP OF WATER: CARVEDILOL,TAMSULOSIN.USE FLONASE AS USUAL. ? ?DO NOT TAKE ANY ORAL DIABETIC MEDICATIONS DAY  OF YOUR SURGERY ? ?Bring CPAP mask and tubing day of surgery. ?                  ?           You may not have any metal on your body including hair pins, jewelry, and body piercing ? ?           Do not wear lotions, powders, perfumes/cologne, or deodorant ? ?  Men may shave face and neck. ? ? Do not bring valuables to the hospital. [facility] NOT ?            RESPONSIBLE   FOR VALUABLES. ? ? Contacts, dentures or bridgework may not be worn into surgery. ? ? Bring small overnight bag day of surgery. ?  ? Patients discharged on the day of surgery will not be allowed to drive home.  Someone NEEDS to stay with you for the first 24 hours after anesthesia. ? ? Special Instructions: Bring a copy of your healthcare power of attorney and living will documents         the day of surgery if you haven't scanned them before. ? ?            Please read over the following fact sheets you were given: IF [place] [phone] ? ?   [place] - Preparing for Surgery ?Before surgery, you can play an important role.  Because skin is not sterile, your skin needs to be as free of germs as possible.  You can reduce the number of germs on your skin by washing with CHG (chlorahexidine gluconate) soap before surgery.  CHG is an antiseptic cleaner which kills germs and bonds with the skin to continue killing germs even after washing. ?Please DO NOT use if you have an allergy to CHG or antibacterial soaps.  If your skin becomes reddened/irritated stop using the CHG and inform your nurse when you arrive at Short Stay. ?Do not shave (including legs and underarms) for at least 48 hours prior to the first CHG shower.  You may shave your face/neck. ?Please follow these instructions carefully: ? 1.  Shower with CHG Soap the night before surgery and the  morning of Surgery. ? 2.  If you choose to wash your hair, wash your hair first as usual with your  normal  shampoo. ? 3.  After  you shampoo, rinse your hair and body thoroughly to remove the  shampoo.                           4.  Use CHG as you would any other liquid soap.  You can apply chg directly  to the skin and wash  ?                     Gently with a scrungie or clean washcloth. ? 5.  Apply the CHG Soap to your body ONLY FROM THE NECK DOWN.   Do not use on face/ open      ?                     Wound or open sores. Avoid contact with eyes, ears mouth and genitals (private parts).  ?                     Production manager,  Genitals (private parts) with your normal soap. ?            6.  Wash thoroughly, paying special attention to the area where your surgery  will be performed. ? 7.  Thoroughly rinse your body with warm water from the neck down. ? 8.  DO NOT shower/wash with your normal soap after using and rinsing off  the CHG Soap. ?  9.  Pat yourself dry with a clean towel. ?           10.  Wear clean pajamas. ?           11.  Place clean sheets on your bed the night of your first shower and do not  sleep with pets. ?Day of Surgery : ?Do not apply any lotions/deodorants the morning of surgery.  Please wear clean clothes to the hospital/surgery center. ? ?FAILURE TO FOLLOW THESE INSTRUCTIONS MAY RESULT IN THE CANCELLATION OF YOUR SURGERY ?PATIENT SIGNATURE_________________________________ ? ?NURSE SIGNATURE__________________________________ ? ?________________________________________________________________________  ?

## 2021-11-22 ENCOUNTER — Encounter (HOSPITAL_COMMUNITY): Payer: Self-pay

## 2021-11-22 ENCOUNTER — Other Ambulatory Visit: Payer: Self-pay

## 2021-11-22 ENCOUNTER — Encounter (HOSPITAL_COMMUNITY)
Admission: RE | Admit: 2021-11-22 | Discharge: 2021-11-22 | Disposition: A | Discharge status: Home / Self Care | Payer: Medicare HMO | Source: Ambulatory Visit | Attending: Urology | Admitting: Urology

## 2021-11-22 VITALS — BP 119/83 | HR 55 | Temp 97.8°F | Ht 79.0 in | Wt 189.0 lb

## 2021-11-22 DIAGNOSIS — R7303 Prediabetes: Secondary | ICD-10-CM | POA: Insufficient documentation

## 2021-11-22 DIAGNOSIS — I10 Essential (primary) hypertension: Secondary | ICD-10-CM | POA: Insufficient documentation

## 2021-11-22 DIAGNOSIS — Z01818 Encounter for other preprocedural examination: Secondary | ICD-10-CM | POA: Insufficient documentation

## 2021-11-22 HISTORY — DX: Chronic kidney disease, unspecified: N18.9

## 2021-11-22 HISTORY — DX: Prediabetes: R73.03

## 2021-11-22 HISTORY — DX: Angina pectoris, unspecified: I20.9

## 2021-11-22 LAB — BASIC METABOLIC PANEL
Anion gap: 7 (ref 5–15)
BUN: 19 mg/dL (ref 8–23)
CO2: 28 mmol/L (ref 22–32)
Calcium: 8.8 mg/dL — ABNORMAL LOW (ref 8.9–10.3)
Chloride: 103 mmol/L (ref 98–111)
Creatinine, Ser: 0.92 mg/dL (ref 0.61–1.24)
GFR, Estimated: >60 mL/min (ref >60)
Glucose, Bld: 109 mg/dL — ABNORMAL HIGH (ref 70–99)
Potassium: 4.2 mmol/L (ref 3.5–5.1)
Sodium: 138 mmol/L (ref 135–145)

## 2021-11-22 LAB — CBC
HCT: 47.5 % (ref 39.0–52.0)
Hemoglobin: 15.5 g/dL (ref 13.0–17.0)
MCH: 31.8 pg (ref 26.0–34.0)
MCHC: 32.6 g/dL (ref 30.0–36.0)
MCV: 97.3 fL (ref 80.0–100.0)
Platelets: 123 10*3/uL — ABNORMAL LOW (ref 150–400)
RBC: 4.88 MIL/uL (ref 4.22–5.81)
RDW: 12.5 % (ref 11.5–15.5)
WBC: 6.3 10*3/uL (ref 4.0–10.5)
nRBC: 0 % (ref 0.0–0.2)

## 2021-11-22 LAB — GLUCOSE, CAPILLARY: Glucose-Capillary: 115 mg/dL — ABNORMAL HIGH (ref 70–99)

## 2021-11-22 LAB — TYPE AND SCREEN
ABO/RH(D): A POS
Antibody Screen: NEGATIVE

## 2021-11-22 NOTE — Progress Notes (Signed)
For Short Stay: ?[place] appointment date: N/A ?Date of COVID positive in last 90 days: N/A ?COVID Vaccine: Pfizer: 09/21/20  x 1 ?Bowel Prep reminder: Reviewed ? ? ?For Anesthesia: ?PCP - Geryl Rankins: NP ?Cardiologist - Dr. Buford Dresser. ? ?Chest x-ray - CT chest: 08/22/21 ?EKG -  ?Stress Test -  ?ECHO - 07/03/15 ?Cardiac Cath - 20 years ago. ?Pacemaker/ICD device last checked: ?Pacemaker orders received: ?Device Rep notified: ? ?Spinal Cord Stimulator: ? ?Sleep Study -  ?CPAP -  ? ?Fasting Blood Sugar - 90's - 100's ?Checks Blood Sugar ___1__ times a week. ?Date and result of last Hgb A1c- ? ?Blood Thinner Instructions: ?Aspirin Instructions: ?Last Dose: ? ?Activity level: Can go up a flight of stairs and activities of daily living without stopping and without chest pain and/or shortness of breath ?  Able to exercise without chest pain and/or shortness of breath ?  Unable to go up a flight of stairs without chest pain and/or shortness of breath ?   ? ?Anesthesia review: Hx: MI,HTN,Pre-DIA,Smoker. ? ?Patient denies shortness of breath, fever, cough and chest pain at PAT appointment ? ? ?Patient verbalized understanding of instructions that were given to them at the PAT appointment. Patient was also instructed that they will need to review over the PAT instructions again at home before surgery.  ?

## 2021-11-23 DIAGNOSIS — R69 Illness, unspecified: Secondary | ICD-10-CM | POA: Diagnosis not present

## 2021-11-23 LAB — HEMOGLOBIN A1C
Hgb A1c MFr Bld: 5.4 % (ref 4.8–5.6)
Mean Plasma Glucose: 108 mg/dL

## 2021-11-23 NOTE — Progress Notes (Signed)
Hgba1c-5.4 on 11/22/21.   ?

## 2021-12-03 ENCOUNTER — Other Ambulatory Visit: Payer: Self-pay

## 2021-12-04 NOTE — Anesthesia Preprocedure Evaluation (Addendum)
Anesthesia Evaluation  ?Patient identified by MRN, date of birth, ID band ?Patient awake ? ? ? ?Reviewed: ?Allergy & Precautions, NPO status , Patient's Chart, lab work & pertinent test results, reviewed documented beta blocker date and time  ? ?Airway ?Mallampati: II ? ?TM Distance: >3 FB ?Neck ROM: Full ? ? ? Dental ? ?(+) Dental Advisory Given, Missing, Caps,  ?  ?Pulmonary ?Current Smoker and Patient abstained from smoking.,  ?  ?Pulmonary exam normal ?breath sounds clear to auscultation ? ? ? ? ? ? Cardiovascular ?hypertension, Pt. on medications and Pt. on home beta blockers ?+ angina with exertion + CAD, + Past MI and + Cardiac Stents  ?Normal cardiovascular exam ?Rhythm:Regular Rate:Normal ? ?EKG 11/22/21 ?Sinus Bradycardia, 1st Deg AV Block ? ?Echo 07/03/15 ?Left ventricle: The cavity size was normal. Wall thickness was increased in a pattern of moderate LVH. Systolic function was normal. The estimated ejection fraction was in the range of 60% to 65%. Wall motion was normal; there were no regional wall motion abnormalities. Left ventricular diastolic function parameters were normal.  ?- Left atrium: The atrium was normal in size.  ?- Inferior vena cava: The vessel was normal in size. The  ???respirophasic diameter changes were in the normal range (>= 50%), consistent with normal central venous pressure.  ?- Pericardium, extracardiac: There was no pericardial effusion.  ? ?Stent x 2 2001 ?  ?Neuro/Psych ?negative neurological ROS ? negative psych ROS  ? GI/Hepatic ?Neg liver ROS, GERD  Medicated and Controlled,  ?Endo/Other  ?Hyperlipidemia ?Obesity ? Renal/GU ?Renal diseaseLeft Renal mass  ? ?BPH ? ?  ?Musculoskeletal ?negative musculoskeletal ROS ?(+)  ? Abdominal ?(+) + obese,   ?Peds ? Hematology ?negative hematology ROS ?(+)   ?Anesthesia Other Findings ? ? Reproductive/Obstetrics ? ?  ? ? ? ? ? ? ? ? ? ? ? ? ? ?  ?  ? ? ? ? ? ? ?Anesthesia Physical ?Anesthesia  Plan ? ?ASA: 3 ? ?Anesthesia Plan: General  ? ?Post-op Pain Management: Tylenol PO (pre-op)* and Precedex  ? ?Induction: Intravenous ? ?PONV Risk Score and Plan: 3 and Treatment may vary due to age or medical condition ? ?Airway Management Planned: Oral ETT ? ?Additional Equipment: Arterial line ? ?Intra-op Plan:  ? ?Post-operative Plan: Extubation in OR ? ?Informed Consent: I have reviewed the patients History and Physical, chart, labs and discussed the procedure including the risks, benefits and alternatives for the proposed anesthesia with the patient or authorized representative who has indicated his/her understanding and acceptance.  ? ? ? ?Dental advisory given ? ?Plan Discussed with: Anesthesiologist and CRNA ? ?Anesthesia Plan Comments:   ? ? ? ? ? ?Anesthesia Quick Evaluation ? ?

## 2021-12-05 ENCOUNTER — Other Ambulatory Visit: Payer: Self-pay

## 2021-12-05 ENCOUNTER — Encounter (HOSPITAL_COMMUNITY)
Admission: AD | Disposition: A | Discharge status: Home / Self Care | Payer: Self-pay | Source: Ambulatory Visit | Attending: Urology

## 2021-12-05 ENCOUNTER — Encounter (HOSPITAL_COMMUNITY): Payer: Self-pay | Admitting: Urology

## 2021-12-05 ENCOUNTER — Inpatient Hospital Stay (HOSPITAL_COMMUNITY)
Admission: AD | Admit: 2021-12-05 | Discharge: 2021-12-07 | DRG: 658 | Disposition: A | Discharge status: Home / Self Care | Payer: Medicare HMO | Source: Ambulatory Visit | Attending: Urology | Admitting: Urology

## 2021-12-05 ENCOUNTER — Ambulatory Visit (HOSPITAL_BASED_OUTPATIENT_CLINIC_OR_DEPARTMENT_OTHER): Payer: Medicare HMO | Admitting: Anesthesiology

## 2021-12-05 ENCOUNTER — Ambulatory Visit (HOSPITAL_COMMUNITY): Payer: Medicare HMO | Admitting: Physician Assistant

## 2021-12-05 DIAGNOSIS — Z8249 Family history of ischemic heart disease and other diseases of the circulatory system: Secondary | ICD-10-CM

## 2021-12-05 DIAGNOSIS — Z823 Family history of stroke: Secondary | ICD-10-CM

## 2021-12-05 DIAGNOSIS — Z820 Family history of epilepsy and other diseases of the nervous system: Secondary | ICD-10-CM | POA: Diagnosis not present

## 2021-12-05 DIAGNOSIS — I10 Essential (primary) hypertension: Secondary | ICD-10-CM | POA: Diagnosis present

## 2021-12-05 DIAGNOSIS — I25119 Atherosclerotic heart disease of native coronary artery with unspecified angina pectoris: Secondary | ICD-10-CM | POA: Diagnosis not present

## 2021-12-05 DIAGNOSIS — Z09 Encounter for follow-up examination after completed treatment for conditions other than malignant neoplasm: Secondary | ICD-10-CM | POA: Diagnosis not present

## 2021-12-05 DIAGNOSIS — Z6831 Body mass index (BMI) 31.0-31.9, adult: Secondary | ICD-10-CM

## 2021-12-05 DIAGNOSIS — E669 Obesity, unspecified: Secondary | ICD-10-CM | POA: Diagnosis present

## 2021-12-05 DIAGNOSIS — E785 Hyperlipidemia, unspecified: Secondary | ICD-10-CM

## 2021-12-05 DIAGNOSIS — R680 Hypothermia, not associated with low environmental temperature: Secondary | ICD-10-CM | POA: Diagnosis present

## 2021-12-05 DIAGNOSIS — F17210 Nicotine dependence, cigarettes, uncomplicated: Secondary | ICD-10-CM | POA: Diagnosis present

## 2021-12-05 DIAGNOSIS — C642 Malignant neoplasm of left kidney, except renal pelvis: Principal | ICD-10-CM | POA: Diagnosis present

## 2021-12-05 DIAGNOSIS — Z95828 Presence of other vascular implants and grafts: Secondary | ICD-10-CM | POA: Diagnosis not present

## 2021-12-05 DIAGNOSIS — R079 Chest pain, unspecified: Secondary | ICD-10-CM | POA: Diagnosis present

## 2021-12-05 DIAGNOSIS — K219 Gastro-esophageal reflux disease without esophagitis: Secondary | ICD-10-CM | POA: Diagnosis present

## 2021-12-05 DIAGNOSIS — Z955 Presence of coronary angioplasty implant and graft: Secondary | ICD-10-CM | POA: Diagnosis not present

## 2021-12-05 DIAGNOSIS — I252 Old myocardial infarction: Secondary | ICD-10-CM | POA: Diagnosis not present

## 2021-12-05 DIAGNOSIS — I440 Atrioventricular block, first degree: Secondary | ICD-10-CM | POA: Diagnosis present

## 2021-12-05 DIAGNOSIS — I739 Peripheral vascular disease, unspecified: Secondary | ICD-10-CM | POA: Diagnosis present

## 2021-12-05 DIAGNOSIS — N2889 Other specified disorders of kidney and ureter: Secondary | ICD-10-CM

## 2021-12-05 DIAGNOSIS — D49512 Neoplasm of unspecified behavior of left kidney: Secondary | ICD-10-CM | POA: Diagnosis present

## 2021-12-05 HISTORY — PX: ROBOTIC ASSITED PARTIAL NEPHRECTOMY: SHX6087

## 2021-12-05 LAB — CBC
HCT: 43.2 % (ref 39.0–52.0)
Hemoglobin: 13.9 g/dL (ref 13.0–17.0)
MCH: 31.6 pg (ref 26.0–34.0)
MCHC: 32.2 g/dL (ref 30.0–36.0)
MCV: 98.2 fL (ref 80.0–100.0)
Platelets: 117 10*3/uL — ABNORMAL LOW (ref 150–400)
RBC: 4.4 MIL/uL (ref 4.22–5.81)
RDW: 12.4 % (ref 11.5–15.5)
WBC: 12.3 10*3/uL — ABNORMAL HIGH (ref 4.0–10.5)
nRBC: 0 % (ref 0.0–0.2)

## 2021-12-05 LAB — GLUCOSE, CAPILLARY
Glucose-Capillary: 162 mg/dL — ABNORMAL HIGH (ref 70–99)
Glucose-Capillary: 127 mg/dL — ABNORMAL HIGH (ref 70–99)
Glucose-Capillary: 146 mg/dL — ABNORMAL HIGH (ref 70–99)

## 2021-12-05 LAB — ABO/RH: ABO/RH(D): A POS

## 2021-12-05 LAB — BASIC METABOLIC PANEL
Anion gap: 5 (ref 5–15)
BUN: 17 mg/dL (ref 8–23)
CO2: 27 mmol/L (ref 22–32)
Calcium: 8.6 mg/dL — ABNORMAL LOW (ref 8.9–10.3)
Chloride: 108 mmol/L (ref 98–111)
Creatinine, Ser: 1.17 mg/dL (ref 0.61–1.24)
GFR, Estimated: >60 mL/min (ref >60)
Glucose, Bld: 155 mg/dL — ABNORMAL HIGH (ref 70–99)
Potassium: 4.1 mmol/L (ref 3.5–5.1)
Sodium: 140 mmol/L (ref 135–145)

## 2021-12-05 SURGERY — NEPHRECTOMY, PARTIAL, ROBOT-ASSISTED
Anesthesia: General | Site: Renal | Laterality: Left

## 2021-12-05 MED ORDER — ONDANSETRON HCL 4 MG/2ML IJ SOLN: 4.0000 mg | INTRAMUSCULAR | Status: DC | PRN

## 2021-12-05 MED ORDER — DEXAMETHASONE SODIUM PHOSPHATE 10 MG/ML IJ SOLN
INTRAMUSCULAR | Status: AC
  Filled 2021-12-05: qty 1

## 2021-12-05 MED ORDER — ACETAMINOPHEN 10 MG/ML IV SOLN
1000.0000 mg | Freq: Four times a day (QID) | INTRAVENOUS | Status: AC
Start: 2021-12-05 — End: 2021-12-06
  Administered 2021-12-05 – 2021-12-06 (×4): 1000 mg via INTRAVENOUS
  Filled 2021-12-05 (×4): qty 100

## 2021-12-05 MED ORDER — LORAZEPAM 2 MG/ML IJ SOLN
0.5000 mg | Freq: Once | INTRAMUSCULAR | Status: AC
  Administered 2021-12-05: 0.5 mg via INTRAVENOUS
  Filled 2021-12-05: qty 1

## 2021-12-05 MED ORDER — LIDOCAINE HCL (PF) 2 % IJ SOLN
INTRAMUSCULAR | Status: AC
  Filled 2021-12-05: qty 5

## 2021-12-05 MED ORDER — FLUTICASONE PROPIONATE 50 MCG/ACT NA SUSP: 2.0000 | Freq: Every day | NASAL | Status: DC | PRN

## 2021-12-05 MED ORDER — BUPIVACAINE LIPOSOME 1.3 % IJ SUSP
INTRAMUSCULAR | Status: AC
  Filled 2021-12-05: qty 20

## 2021-12-05 MED ORDER — ROCURONIUM BROMIDE 10 MG/ML (PF) SYRINGE
PREFILLED_SYRINGE | INTRAVENOUS | Status: AC
  Filled 2021-12-05: qty 10

## 2021-12-05 MED ORDER — EPHEDRINE SULFATE-NACL 50-0.9 MG/10ML-% IV SOSY
PREFILLED_SYRINGE | INTRAVENOUS | Status: DC | PRN
  Administered 2021-12-05: 10 mg via INTRAVENOUS
  Administered 2021-12-05: 5 mg via INTRAVENOUS
  Administered 2021-12-05: 10 mg via INTRAVENOUS
  Administered 2021-12-05: 5 mg via INTRAVENOUS

## 2021-12-05 MED ORDER — CHLORHEXIDINE GLUCONATE 0.12 % MT SOLN
15.0000 mL | Freq: Once | OROMUCOSAL | Status: AC
  Administered 2021-12-05: 15 mL via OROMUCOSAL

## 2021-12-05 MED ORDER — SUGAMMADEX SODIUM 200 MG/2ML IV SOLN
INTRAVENOUS | Status: DC | PRN
Start: 2021-12-05 — End: 2021-12-05
  Administered 2021-12-05: 200 mg via INTRAVENOUS

## 2021-12-05 MED ORDER — STERILE WATER FOR IRRIGATION IR SOLN
Status: DC | PRN
  Administered 2021-12-05: 1000 mL

## 2021-12-05 MED ORDER — HYDROMORPHONE HCL 1 MG/ML IJ SOLN
0.2500 mg | INTRAMUSCULAR | Status: DC | PRN
  Administered 2021-12-05 (×2): 0.5 mg via INTRAVENOUS

## 2021-12-05 MED ORDER — EPHEDRINE 5 MG/ML INJ
INTRAVENOUS | Status: AC
  Filled 2021-12-05: qty 5

## 2021-12-05 MED ORDER — AMISULPRIDE (ANTIEMETIC) 5 MG/2ML IV SOLN: 10.0000 mg | Freq: Once | INTRAVENOUS | Status: DC | PRN

## 2021-12-05 MED ORDER — BUPIVACAINE LIPOSOME 1.3 % IJ SUSP
INTRAMUSCULAR | Status: DC | PRN
  Administered 2021-12-05: 20 mL

## 2021-12-05 MED ORDER — NICOTINE 21 MG/24HR TD PT24
21.0000 mg | MEDICATED_PATCH | Freq: Every day | TRANSDERMAL | Status: DC
  Administered 2021-12-05 – 2021-12-07 (×3): 21 mg via TRANSDERMAL
  Filled 2021-12-05 (×3): qty 1

## 2021-12-05 MED ORDER — POLYETHYLENE GLYCOL 3350 17 GM/SCOOP PO POWD
1.0000 | Freq: Once | ORAL | Status: DC
  Filled 2021-12-05: qty 255

## 2021-12-05 MED ORDER — PHENYLEPHRINE 40 MCG/ML (10ML) SYRINGE FOR IV PUSH (FOR BLOOD PRESSURE SUPPORT)
PREFILLED_SYRINGE | INTRAVENOUS | Status: AC
  Filled 2021-12-05: qty 10

## 2021-12-05 MED ORDER — PROPOFOL 10 MG/ML IV BOLUS
INTRAVENOUS | Status: AC
  Filled 2021-12-05: qty 20

## 2021-12-05 MED ORDER — FENTANYL CITRATE (PF) 100 MCG/2ML IJ SOLN
INTRAMUSCULAR | Status: DC | PRN
  Administered 2021-12-05: 50 ug via INTRAVENOUS

## 2021-12-05 MED ORDER — TAMSULOSIN HCL 0.4 MG PO CAPS
0.4000 mg | ORAL_CAPSULE | Freq: Every day | ORAL | Status: DC
  Administered 2021-12-06 – 2021-12-07 (×2): 0.4 mg via ORAL
  Filled 2021-12-05 (×2): qty 1

## 2021-12-05 MED ORDER — BUPIVACAINE-EPINEPHRINE (PF) 0.5% -1:200000 IJ SOLN
INTRAMUSCULAR | Status: AC
  Filled 2021-12-05: qty 30

## 2021-12-05 MED ORDER — DEXAMETHASONE SODIUM PHOSPHATE 10 MG/ML IJ SOLN
INTRAMUSCULAR | Status: DC | PRN
  Administered 2021-12-05: 10 mg via INTRAVENOUS

## 2021-12-05 MED ORDER — BUPIVACAINE-EPINEPHRINE 0.5% -1:200000 IJ SOLN
INTRAMUSCULAR | Status: DC | PRN
  Administered 2021-12-05: 30 mL

## 2021-12-05 MED ORDER — LACTATED RINGERS IV SOLN: INTRAVENOUS | Status: DC

## 2021-12-05 MED ORDER — PROPOFOL 10 MG/ML IV BOLUS
INTRAVENOUS | Status: DC | PRN
  Administered 2021-12-05: 180 mg via INTRAVENOUS

## 2021-12-05 MED ORDER — SENNOSIDES-DOCUSATE SODIUM 8.6-50 MG PO TABS
2.0000 | ORAL_TABLET | Freq: Every day | ORAL | Status: DC
  Administered 2021-12-05 – 2021-12-06 (×2): 2 via ORAL
  Filled 2021-12-05 (×2): qty 2

## 2021-12-05 MED ORDER — ACETAMINOPHEN 500 MG PO TABS: 1000.0000 mg | ORAL_TABLET | Freq: Four times a day (QID) | ORAL | Status: DC

## 2021-12-05 MED ORDER — CEFAZOLIN SODIUM-DEXTROSE 2-4 GM/100ML-% IV SOLN
2.0000 g | INTRAVENOUS | Status: AC
  Administered 2021-12-05: 2 g via INTRAVENOUS
  Filled 2021-12-05: qty 100

## 2021-12-05 MED ORDER — PHENYLEPHRINE 40 MCG/ML (10ML) SYRINGE FOR IV PUSH (FOR BLOOD PRESSURE SUPPORT)
PREFILLED_SYRINGE | INTRAVENOUS | Status: DC | PRN
  Administered 2021-12-05 (×3): 80 ug via INTRAVENOUS

## 2021-12-05 MED ORDER — ALBUTEROL SULFATE (2.5 MG/3ML) 0.083% IN NEBU: 2.5000 mg | INHALATION_SOLUTION | Freq: Four times a day (QID) | RESPIRATORY_TRACT | Status: DC | PRN

## 2021-12-05 MED ORDER — LACTATED RINGERS IR SOLN
Status: DC | PRN
  Administered 2021-12-05: 1000 mL

## 2021-12-05 MED ORDER — ONDANSETRON HCL 4 MG/2ML IJ SOLN: 4.0000 mg | Freq: Once | INTRAMUSCULAR | Status: DC | PRN

## 2021-12-05 MED ORDER — SODIUM CHLORIDE 0.45 % IV SOLN: INTRAVENOUS | Status: AC

## 2021-12-05 MED ORDER — ATORVASTATIN CALCIUM 40 MG PO TABS
40.0000 mg | ORAL_TABLET | Freq: Every day | ORAL | Status: DC
  Administered 2021-12-05 – 2021-12-06 (×2): 40 mg via ORAL
  Filled 2021-12-05 (×2): qty 1

## 2021-12-05 MED ORDER — HYDROMORPHONE HCL 1 MG/ML IJ SOLN
0.5000 mg | INTRAMUSCULAR | Status: DC | PRN
  Administered 2021-12-05 – 2021-12-06 (×2): 0.5 mg via INTRAVENOUS
  Filled 2021-12-05 (×2): qty 0.5

## 2021-12-05 MED ORDER — HYDROMORPHONE HCL 1 MG/ML IJ SOLN
INTRAMUSCULAR | Status: AC
  Filled 2021-12-05: qty 1

## 2021-12-05 MED ORDER — CARVEDILOL 3.125 MG PO TABS
3.1250 mg | ORAL_TABLET | Freq: Two times a day (BID) | ORAL | Status: DC
  Administered 2021-12-05 – 2021-12-07 (×3): 3.125 mg via ORAL
  Filled 2021-12-05 (×4): qty 1

## 2021-12-05 MED ORDER — INSULIN ASPART 100 UNIT/ML IJ SOLN
0.0000 [IU] | Freq: Three times a day (TID) | INTRAMUSCULAR | Status: DC
  Administered 2021-12-05: 2 [IU] via SUBCUTANEOUS

## 2021-12-05 MED ORDER — SURGIFLO WITH THROMBIN (HEMOSTATIC MATRIX KIT) OPTIME
TOPICAL | Status: DC | PRN
  Administered 2021-12-05: 1 via TOPICAL

## 2021-12-05 MED ORDER — ONDANSETRON HCL 4 MG/2ML IJ SOLN
INTRAMUSCULAR | Status: DC | PRN
Start: 2021-12-05 — End: 2021-12-05
  Administered 2021-12-05: 4 mg via INTRAVENOUS

## 2021-12-05 MED ORDER — ROCURONIUM BROMIDE 10 MG/ML (PF) SYRINGE
PREFILLED_SYRINGE | INTRAVENOUS | Status: DC | PRN
  Administered 2021-12-05 (×2): 20 mg via INTRAVENOUS
  Administered 2021-12-05: 10 mg via INTRAVENOUS
  Administered 2021-12-05: 20 mg via INTRAVENOUS
  Administered 2021-12-05: 100 mg via INTRAVENOUS

## 2021-12-05 MED ORDER — OXYCODONE HCL 5 MG PO TABS
5.0000 mg | ORAL_TABLET | ORAL | Status: DC | PRN
  Administered 2021-12-06 – 2021-12-07 (×4): 10 mg via ORAL
  Filled 2021-12-05 (×5): qty 2

## 2021-12-05 MED ORDER — INSULIN ASPART 100 UNIT/ML IJ SOLN: 0.0000 [IU] | Freq: Every day | INTRAMUSCULAR | Status: DC

## 2021-12-05 MED ORDER — LIDOCAINE HCL (CARDIAC) PF 100 MG/5ML IV SOSY
PREFILLED_SYRINGE | INTRAVENOUS | Status: DC | PRN
  Administered 2021-12-05: 100 mg via INTRAVENOUS

## 2021-12-05 MED ORDER — SODIUM CHLORIDE (PF) 0.9 % IJ SOLN
INTRAMUSCULAR | Status: DC | PRN
  Administered 2021-12-05: 20 mL

## 2021-12-05 MED ORDER — ORAL CARE MOUTH RINSE: 15.0000 mL | Freq: Once | OROMUCOSAL | Status: AC

## 2021-12-05 MED ORDER — SODIUM CHLORIDE (PF) 0.9 % IJ SOLN
INTRAMUSCULAR | Status: AC
  Filled 2021-12-05: qty 20

## 2021-12-05 MED ORDER — FENTANYL CITRATE (PF) 100 MCG/2ML IJ SOLN
INTRAMUSCULAR | Status: AC
Start: 2021-12-05 — End: ?
  Filled 2021-12-05: qty 2

## 2021-12-05 MED ORDER — LACTATED RINGERS IV SOLN
INTRAVENOUS | Status: DC
Start: 2021-12-05 — End: 2021-12-05

## 2021-12-05 MED ORDER — ONDANSETRON HCL 4 MG/2ML IJ SOLN
INTRAMUSCULAR | Status: AC
  Filled 2021-12-05: qty 2

## 2021-12-05 SURGICAL SUPPLY — 77 items
ADH SKN CLS APL DERMABOND .7 (GAUZE/BANDAGES/DRESSINGS) ×1
AGENT HMST KT MTR STRL THRMB (HEMOSTASIS) ×1
APL ESCP 34 STRL LF DISP (HEMOSTASIS) ×1
APL PRP STRL LF DISP 70% ISPRP (MISCELLANEOUS) ×1
APL SRG 38 LTWT LNG FL B (MISCELLANEOUS)
APPLICATOR ARISTA FLEXITIP XL (MISCELLANEOUS) IMPLANT
APPLICATOR SURGIFLO ENDO (HEMOSTASIS) ×2 IMPLANT
BAG COUNTER SPONGE SURGICOUNT (BAG) IMPLANT
BAG RETRIEVAL 10 (BASKET) ×1
BAG SPNG CNTER NS LX DISP (BAG)
CHLORAPREP W/TINT 26 (MISCELLANEOUS) ×2 IMPLANT
CLIP LIGATING HEM O LOK PURPLE (MISCELLANEOUS) ×2 IMPLANT
CLIP LIGATING HEMO LOK XL GOLD (MISCELLANEOUS) IMPLANT
CLIP LIGATING HEMO O LOK GREEN (MISCELLANEOUS) ×4 IMPLANT
COVER SURGICAL LIGHT HANDLE (MISCELLANEOUS) ×2 IMPLANT
COVER TIP SHEARS 8 DVNC (MISCELLANEOUS) ×1 IMPLANT
COVER TIP SHEARS 8MM DA VINCI (MISCELLANEOUS) ×2
CUTTER ECHEON FLEX ENDO 45 340 (ENDOMECHANICALS) IMPLANT
DERMABOND ADVANCED (GAUZE/BANDAGES/DRESSINGS) ×1
DERMABOND ADVANCED .7 DNX12 (GAUZE/BANDAGES/DRESSINGS) ×1 IMPLANT
DRAIN CHANNEL 15F RND FF 3/16 (WOUND CARE) ×2 IMPLANT
DRAPE ARM DVNC X/XI (DISPOSABLE) ×4 IMPLANT
DRAPE COLUMN DVNC XI (DISPOSABLE) ×1 IMPLANT
DRAPE DA VINCI XI ARM (DISPOSABLE) ×8
DRAPE DA VINCI XI COLUMN (DISPOSABLE) ×2
DRAPE INCISE IOBAN 66X45 STRL (DRAPES) ×2 IMPLANT
DRAPE SHEET LG 3/4 BI-LAMINATE (DRAPES) ×2 IMPLANT
ELECT PENCIL ROCKER SW 15FT (MISCELLANEOUS) ×2 IMPLANT
ELECT REM PT RETURN 15FT ADLT (MISCELLANEOUS) ×2 IMPLANT
EVACUATOR SILICONE 100CC (DRAIN) ×2 IMPLANT
GLOVE SURG ENC MOIS LTX SZ6.5 (GLOVE) ×2 IMPLANT
GLOVE SURG ENC TEXT LTX SZ7.5 (GLOVE) ×4 IMPLANT
GOWN STRL REUS W/ TWL XL LVL3 (GOWN DISPOSABLE) ×2 IMPLANT
GOWN STRL REUS W/TWL XL LVL3 (GOWN DISPOSABLE) ×4
HEMOSTAT ARISTA ABSORB 3G PWDR (HEMOSTASIS) IMPLANT
HOLDER FOLEY CATH W/STRAP (MISCELLANEOUS) IMPLANT
IRRIG SUCT STRYKERFLOW 2 WTIP (MISCELLANEOUS) ×2
IRRIGATION SUCT STRKRFLW 2 WTP (MISCELLANEOUS) ×1 IMPLANT
KIT BASIN OR (CUSTOM PROCEDURE TRAY) ×2 IMPLANT
KIT TURNOVER KIT A (KITS) IMPLANT
LOOP VESSEL MAXI BLUE (MISCELLANEOUS) ×2 IMPLANT
MARKER SKIN DUAL TIP RULER LAB (MISCELLANEOUS) ×2 IMPLANT
NDL INSUFFLATION 14GA 120MM (NEEDLE) IMPLANT
NEEDLE INSUFFLATION 14GA 120MM (NEEDLE) ×2 IMPLANT
NS IRRIG 1000ML POUR BTL (IV SOLUTION) ×2 IMPLANT
PAD POSITIONING PINK XL (MISCELLANEOUS) ×2 IMPLANT
PROTECTOR NERVE ULNAR (MISCELLANEOUS) ×4 IMPLANT
RELOAD STAPLE 45 2.6 WHT THIN (STAPLE) IMPLANT
SEAL CANN UNIV 5-8 DVNC XI (MISCELLANEOUS) ×4 IMPLANT
SEAL XI 5MM-8MM UNIVERSAL (MISCELLANEOUS) ×8
SET TUBE SMOKE EVAC HIGH FLOW (TUBING) ×2 IMPLANT
SOLUTION ELECTROLUBE (MISCELLANEOUS) ×2 IMPLANT
SPIKE FLUID TRANSFER (MISCELLANEOUS) ×2 IMPLANT
STAPLE RELOAD 45 WHT (STAPLE) IMPLANT
STAPLE RELOAD 45MM WHITE (STAPLE)
SURGIFLO W/THROMBIN 8M KIT (HEMOSTASIS) ×2 IMPLANT
SUT ETHILON 3 0 PS 1 (SUTURE) ×2 IMPLANT
SUT MNCRL AB 4-0 PS2 18 (SUTURE) ×4 IMPLANT
SUT V-LOC BARB 180 2/0GR6 GS22 (SUTURE) ×2
SUT VIC AB 0 CT1 27 (SUTURE) ×2
SUT VIC AB 0 CT1 27XBRD ANTBC (SUTURE) ×1 IMPLANT
SUT VIC AB 3-0 SH 27 (SUTURE) ×2
SUT VIC AB 3-0 SH 27XBRD (SUTURE) IMPLANT
SUT VICRYL 0 UR6 27IN ABS (SUTURE) IMPLANT
SUT VLOC BARB 180 ABS3/0GR12 (SUTURE) ×2
SUTURE V-LC BRB 180 2/0GR6GS22 (SUTURE) ×1 IMPLANT
SUTURE VLOC BRB 180 ABS3/0GR12 (SUTURE) ×1 IMPLANT
SYS BAG RETRIEVAL 10MM (BASKET) ×1
SYSTEM BAG RETRIEVAL 10MM (BASKET) ×1 IMPLANT
TOWEL OR 17X26 10 PK STRL BLUE (TOWEL DISPOSABLE) ×2 IMPLANT
TOWEL OR NON WOVEN STRL DISP B (DISPOSABLE) ×2 IMPLANT
TRAY FOLEY MTR SLVR 16FR STAT (SET/KITS/TRAYS/PACK) ×2 IMPLANT
TRAY LAPAROSCOPIC (CUSTOM PROCEDURE TRAY) ×2 IMPLANT
TROCAR BLADELESS OPT 5 100 (ENDOMECHANICALS) IMPLANT
TROCAR ENDOPATH XCEL 12X100 BL (ENDOMECHANICALS) IMPLANT
TROCAR XCEL 12X100 BLDLESS (ENDOMECHANICALS) ×2 IMPLANT
WATER STERILE IRR 1000ML POUR (IV SOLUTION) ×2 IMPLANT

## 2021-12-05 NOTE — Progress Notes (Addendum)
?   12/05/21 1642  ?Vitals  ?Temp (!) 97.3 ?F (36.3 ?C)  ?Temp Source Rectal  ?MEWS COLOR  ?MEWS Score Color Green  ?MEWS Score  ?MEWS Temp 0  ?MEWS Systolic 0  ?MEWS Pulse 0  ?MEWS RR 0  ?MEWS LOC 1  ?MEWS Score 1  ? ?CBC has resulted. No concerns about CBC at this time. Patient's temp has improved. Dr. Louis Meckel made aware and assessed patient at bedside. ? ?Layla Maw, RN ?

## 2021-12-05 NOTE — H&P (Signed)
76 year old [place] male who is a longtime tobacco smoker who has a 3-1/2 cm left lower pole posterior renal mass. He is here today for follow-up and further discussion.  ? ?Patient is asymptomatic from his renal mass. It was seen as part of a chest CT that was performed for screening for lung cancer. I saw the patient initially approximately 1 month ago for progressive urinary tract symptoms. We doubled his tamsulosin. Since doing that his symptoms seem to have improved slightly.  ? ?The patient has normal renal function. He has a history of heart attack 20 years ago as well as peripheral vascular disease. He had a stent placed in his heart 20 years prior, and sees cardiology on an annual basis. He has not had any heart attacks or angina equivalent type symptoms since then. He also had stents placed in his iliac artery 20 years prior. He seems to be doing reasonably well. He has a history of vertigo, but otherwise no real significant comorbidities. He is nondiabetic. He does have a history of tobacco abuse. He has never had any intra-abdominal surgeries.  ? ?  ?ALLERGIES: No Allergies   ? ?MEDICATIONS: Omeprazole 40 mg capsule,delayed release  ?Tamsulosin Hcl 0.4 mg capsule 2 capsule PO Daily  ?Tamsulosin Hcl 0.4 mg capsule  ?Accu-Chek Guide Test Strip  ?Accu-Chek Softclix  ?Albuterol Sulfate  ?Atorvastatin Calcium 40 mg tablet  ?Carvedilol 3.125 mg tablet  ?Fluticasone Propionate 50 mcg/actuation spray, suspension  ?Omega 3  ?Vitamin D  ?  ? ?GU PSH: No GU PSH   ?   ?PSH Notes: Angioplasty, cardiac catheterization/history of Acute MI- stents placed  ? ?NON-GU PSH: No Non-GU PSH   ? 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  ?   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 ?NON-GU PMH: Hypercholesterolemia ?Hypertension ?  ? ?FAMILY HISTORY: 2 daughters - Daughter ?Alzheimer's  Disease - Father ?Hypertension - Father ?stroke - Father  ? ?SOCIAL HISTORY: Marital Status: Divorced ?Preferred Language: English; Ethnicity: Not Hispanic Or Latino ?Current Smoking Status: Patient smokes. Has smoked since 09/10/1991. Smokes 1 pack per day.  ? ?Tobacco Use Assessment Completed: Used Tobacco in last 30 days? ?Has never drank.  ?Does not drink caffeine. ?Patient's occupation is/was Retired. ?  ?  Notes: Patient drinks green tea  ? ?REVIEW OF SYSTEMS:    ?GU Review Male:   Patient denies frequent urination, hard to postpone urination, burning/ pain with urination, get up at night to urinate, leakage of urine, stream starts and stops, trouble starting your stream, have to strain to urinate , erection problems, and penile pain.  ?Gastrointestinal (Upper):   Patient denies nausea, vomiting, and indigestion/ heartburn.  ?Gastrointestinal (Lower):   Patient denies diarrhea and constipation.  ?Constitutional:   Patient denies fever, night sweats, weight loss, and fatigue.  ?Skin:   Patient denies skin rash/ lesion and itching.  ?Eyes:   Patient denies blurred vision and double vision.  ?Ears/ Nose/ Throat:   Patient denies sore throat and sinus problems.  ?Hematologic/Lymphatic:   Patient denies swollen glands and easy bruising.  ?Cardiovascular:   Patient denies leg swelling and chest pains.  ?Respiratory:   Patient denies cough and shortness of breath.  ?Endocrine:   Patient denies excessive thirst.  ?Musculoskeletal:   Patient denies back pain and joint pain.  ?Neurological:   Patient denies headaches and dizziness.  ?Psychologic:  Patient denies depression and anxiety.  ? ?VITAL SIGNS: None  ? ?MULTI-SYSTEM PHYSICAL EXAMINATION:    ?Constitutional: Well-nourished. No physical deformities. Normally developed. Good grooming.  ?Respiratory: Normal breath sounds. No labored breathing, no use of accessory muscles.   ?Cardiovascular: Regular rate and rhythm. No murmur, no gallop. Normal temperature, normal  extremity pulses, no swelling, no varicosities.   ? ?  ?Complexity of Data:  ?Source Of History:  Patient  ?Records Review:   Previous Doctor Records, Previous Patient Records, POC Tool  ?Urine Test Review:   Urinalysis  ?X-Ray Review: C.T. Abdomen/Pelvis: Reviewed Films. Discussed With Patient.  ?  ? ?PROCEDURES: None  ? ?ASSESSMENT:  ?    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 ?PLAN:    ? ?      Schedule ?Return Visit/Planned Activity: ASAP - Schedule Surgery  ? ? ?      Document ?Letter(s):  Created for Patient: Clinical Summary  ? ? ?The patient has been given the natural history of renal cancer, treatment options, and I recommended surgical extirpation for this patient. I went over the robotic-assisted laparoscopic partial nephrectomy approach. I described for the patient the procedure in detail including port placement. I detailed the postoperative course including the fact that the patient would have both a drain and a Foley catheter following the surgery. I told the patient that most often patients are discharged on postoperative day one or 2. I then detailed the expected recovery time, I told the patient that he would not be able to lift anything greater than 20 pounds for 4 weeks. I also went over the risks and benefits of this operation in great detail. We discussed the risk of injury to surrounding structures, major blood vessels and nerves, bleeding, infection, loss of kidney, and the risk of recurrent cancer.  ? ? ?     Notes:   I discussed both cryoablation and partial nephrectomy. I went through the differences as well as the risk and the benefits. The patient is opted for the more aggressive approach and partial nephrectomy. We will have him cleared by his primary care doctor and schedule him thereafter.  ? ?  ? ?

## 2021-12-05 NOTE — Discharge Instructions (Signed)

## 2021-12-05 NOTE — Transfer of Care (Signed)
Immediate Anesthesia Transfer of Care Note ? ?Patient: Samuel Lawson ? ?Procedure(s) Performed: XI ROBOTIC ASSITED PARTIAL NEPHRECTOMY (Left: Renal) ? ?Patient Location: PACU ? ?Anesthesia Type:General ? ?Level of Consciousness: sedated ? ?Airway & Oxygen Therapy: Patient Spontanous Breathing and Patient connected to face mask oxygen ? ?Post-op Assessment: Report given to RN and Post -op Vital signs reviewed and stable ? ?Post vital signs: Reviewed and stable ? ?Last Vitals:  ?Vitals Value Taken Time  ?BP 83/52 12/05/21 1210  ?Temp    ?Pulse 62 12/05/21 1212  ?Resp 19 12/05/21 1212  ?SpO2 97 % 12/05/21 1212  ?Vitals shown include unvalidated device data. ? ?Last Pain:  ?Vitals:  ? 12/05/21 0630  ?TempSrc:   ?PainSc: 0-No pain  ?   ? ?  ? ?Complications: No notable events documented. ?

## 2021-12-05 NOTE — Op Note (Addendum)
Preoperative diagnosis:  ?Left renal mass  ? ?Postoperative diagnosis:  ?same  ? ?Procedure: ?Robotic assisted laparoscopic left partial nephrectomy ? ?Surgeon: Ardis Hughs, MD ?1st assistant: Reola Mosher, MD ? ?Anesthesia: General ? ?Complications: None ? ?Intraoperative findings:  ?#1. 3 cm left posterior lower pole mass ?#2. Warm ischemia time 18 minutes ? ?EBL: 250 mL ? ?Specimens: left renal mass ? ? ?Indication: Samuel Lawson is a 76 y.o. patient with left renal mass.  After reviewing the management options for treatment, he elected to proceed with the above surgical procedure(s). We have discussed the potential benefits and risks of the procedure, side effects of the proposed treatment, the likelihood of the patient achieving the goals of the procedure, and any potential problems that might occur during the procedure or recuperation. Informed consent has been obtained. ? ?Description of procedure: ? ?The patient was taken to the operating room and a general anesthetic was administered. The patient was given preoperative antibiotics, placed in the right modified flank position with care to pad all potential pressure points, and prepped and draped in the usual sterile fashion. Next a preoperative timeout was performed. ? ?A site was selected on the left side of the umbilicus for placement of the camera port. This was placed using a standard modified Hassan technique with entry into the peritoneum with a  8 mm tocar. We entered the peritoneum without incident and established pneumoperitoneum.  The camera was then used to inspect the abdomen and there was no evidence of any intra-abdominal injuries or other abnormalities. The remaining abdominal ports were then placed. 8 mm robotic ports were placed in the left upper quadrant, left lower quadrant, and left lateral abdominal wall, making a soft J. A 12 mm port was placed in the upper midline for laparoscopic assistance. All ports were placed under  direct vision without difficulty. The surgical cart was then docked.  ? ?Utilizing the cautery scissors, the white line of Toldt was incised allowing the colon to be mobilized medially and the plane between the mesocolon and the anterior layer of Gerota?s fascia to be developed and the kidney to be exposed.  The ureter and gonadal vein were identified inferiorly and the ureter was lifted anteriorly off the psoas muscle.  Dissection proceeded superiorly along the gonadal vein until the renal vein was identified.  The renal hilum was then carefully isolated with a combination of blunt and sharp dissection allowing the renal arterial and venous structures to be separated and isolated in preparation for renal hilar vessel clamping. ? ?Attention turned to the kidney and the perinephric fat surrounding the renal mass was removed and the kidney was mobilized sufficiently for exposure and resection of the renal mass.   ? ?Once the renal mass was properly isolated, preparations were made for resection of the tumor.    The renal artery was then clamped with a bulldog clamp.  The tumor was then excised with cold scissor dissection along with an adequate visible gross margin of normal renal parenchyma. The tumor appeared to be excised without any gross violation of the tumor. The renal collecting system was not entered during removal of the tumor.  Two running 3-0 V-lock sutures were then brought through the capsule of the kidney and run along the base of the renal defect to provide hemostasis and close any entry into the renal collecting system if present. Weck clips were used to secure this suture outside the renal capsule at the proximal and distal ends. The bulldog clamps  were then removed from the renal hilar vessel. A running 2-0 V lock suture was then used to close the capsule of the kidney using a sliding clip technique which resulted in excellent hemostasis. An additional hemostatic agent (Surgiflo) was then placed  into the renal defect.   ? ? Total warm renal ischemia time was 18 minutes. The renal tumor resection site was examined. Hemostasis appeared adequate.  ? ?The kidney was placed back into its normal anatomic position and covered with perinephric fat as needed.  A # 72 Blake drain was then brought through the lateral lower port site and positioned in the perinephric space.  It was secured to the skin with a nylon suture. The surgical robotic cart was undocked.  The renal tumor specimen was removed intact within an endopouch retrieval bag via the assistant port sites.  The 12 mm assisant port site was then closed at the fascial level with 0-vicryl suture.  All other laparoscopic/robotic ports were removed under direct vision and the pneumoperitoneum let down with inspection of the operative field performed and hemostasis again confirmed. The subcutaneous tissues of the extraction site was closed with a 3-0 Vicryl as well. All incision sites were then injected with local anesthetic and reapproximated at the skin level with 4-0 monocryl subcuticular closures. Dermabond was applied to the skin.  The patient tolerated the procedure well and without complications.  The patient was able to be extubated and transferred to the recovery unit in satisfactory condition. ?  ?

## 2021-12-05 NOTE — Anesthesia Postprocedure Evaluation (Signed)
Anesthesia Post Note ? ?Patient: Samuel Lawson ? ?Procedure(s) Performed: XI ROBOTIC ASSITED PARTIAL NEPHRECTOMY (Left: Renal) ? ?  ? ?Patient location during evaluation: PACU ?Anesthesia Type: General ?Level of consciousness: awake and alert and oriented ?Pain management: pain level controlled ?Vital Signs Assessment: post-procedure vital signs reviewed and stable ?Respiratory status: spontaneous breathing, nonlabored ventilation and respiratory function stable ?Cardiovascular status: blood pressure returned to baseline and stable ?Postop Assessment: no apparent nausea or vomiting ?Anesthetic complications: no ? ? ?No notable events documented. ? ?Last Vitals:  ?Vitals:  ? 12/05/21 1245 12/05/21 1300  ?BP: 112/72 111/64  ?Pulse: (!) 57 (!) 56  ?Resp: 17 15  ?Temp:    ?SpO2: 93% 92%  ?  ?Last Pain:  ?Vitals:  ? 12/05/21 1300  ?TempSrc:   ?PainSc: Asleep  ? ? ?  ?  ?  ?  ?  ?  ? ?Kioni Stahl A. ? ? ? ? ?

## 2021-12-05 NOTE — Interval H&P Note (Signed)
History and Physical Interval Note: ? ?12/05/2021 ?6:52 AM ? ?Samuel Lawson  has presented today for surgery, with the diagnosis of LEFT RENAL MASS.  The various methods of treatment have been discussed with the patient and family. After consideration of risks, benefits and other options for treatment, the patient has consented to  Procedure(s) with comments: ?XI ROBOTIC ASSITED PARTIAL NEPHRECTOMY (Left) - 3.5 HRS as a surgical intervention.  The patient's history has been reviewed, patient examined, no change in status, stable for surgery.  I have reviewed the patient's chart and labs.  Questions were answered to the patient's satisfaction.   ? ? ?Ardis Hughs ? ? ?

## 2021-12-05 NOTE — Anesthesia Procedure Notes (Signed)
Procedure Name: Intubation ?Date/Time: 12/05/2021 7:32 AM ?Performed by: Lind Covert, CRNA ?Pre-anesthesia Checklist: Patient identified, Emergency Drugs available, Suction available and Patient being monitored ?Patient Re-evaluated:Patient Re-evaluated prior to induction ?Oxygen Delivery Method: Circle system utilized ?Preoxygenation: Pre-oxygenation with 100% oxygen ?Induction Type: IV induction ?Ventilation: Mask ventilation without difficulty ?Laryngoscope Size: Mac and 4 ?Grade View: Grade I ?Tube type: Oral ?Tube size: 7.5 mm ?Number of attempts: 1 ?Airway Equipment and Method: Stylet ?Placement Confirmation: ETT inserted through vocal cords under direct vision, positive ETCO2 and breath sounds checked- equal and bilateral ?Secured at: 22 cm ?Tube secured with: Tape ?Dental Injury: Teeth and Oropharynx as per pre-operative assessment  ? ? ? ? ?

## 2021-12-05 NOTE — Progress Notes (Signed)
?   12/05/21 1438  ?Vitals  ?Temp (!) 95.9 ?F (35.5 ?C)  ?Temp Source Rectal  ?MEWS COLOR  ?MEWS Score Color Yellow  ?MEWS Score  ?MEWS Temp 1  ?MEWS Systolic 0  ?MEWS Pulse 0  ?MEWS RR 0  ?MEWS LOC 1  ?MEWS Score 2  ?Provider Notification  ?Provider Name/Title MacDiarmid, MD  ?Date Provider Notified 12/05/21  ?Time Provider Notified [phone]  ?Method of Notification Page  ?Notification Reason Critical result  ?Test performed and critical result Rectal temp, 95.9 F  ?Date Critical Result Received 12/05/21  ?Time Critical Result Received 1438  ?Provider response Other (Comment) ?(Waiting to see CBC results from recent blood draw)  ?Date of Provider Response 12/05/21  ?Time of Provider Response 1441  ? ?Patient has a rectal temp of 95.9. Patient's face is visibly pale and upper extermities are cool to touch. Dr. Louis Meckel contacted initially, but was unsuccessful. Dr. Matilde Sprang notified and stated to wait for CBC results that were recently drawn. ? ?Layla Maw, RN ?

## 2021-12-06 ENCOUNTER — Encounter (HOSPITAL_COMMUNITY): Payer: Self-pay | Admitting: Urology

## 2021-12-06 DIAGNOSIS — Z09 Encounter for follow-up examination after completed treatment for conditions other than malignant neoplasm: Secondary | ICD-10-CM | POA: Diagnosis not present

## 2021-12-06 DIAGNOSIS — I440 Atrioventricular block, first degree: Secondary | ICD-10-CM | POA: Diagnosis present

## 2021-12-06 DIAGNOSIS — Z95828 Presence of other vascular implants and grafts: Secondary | ICD-10-CM | POA: Diagnosis not present

## 2021-12-06 DIAGNOSIS — F17210 Nicotine dependence, cigarettes, uncomplicated: Secondary | ICD-10-CM | POA: Diagnosis present

## 2021-12-06 DIAGNOSIS — C642 Malignant neoplasm of left kidney, except renal pelvis: Secondary | ICD-10-CM | POA: Diagnosis present

## 2021-12-06 DIAGNOSIS — Z820 Family history of epilepsy and other diseases of the nervous system: Secondary | ICD-10-CM | POA: Diagnosis not present

## 2021-12-06 DIAGNOSIS — R680 Hypothermia, not associated with low environmental temperature: Secondary | ICD-10-CM | POA: Diagnosis present

## 2021-12-06 DIAGNOSIS — E669 Obesity, unspecified: Secondary | ICD-10-CM | POA: Diagnosis present

## 2021-12-06 DIAGNOSIS — I252 Old myocardial infarction: Secondary | ICD-10-CM | POA: Diagnosis not present

## 2021-12-06 DIAGNOSIS — I10 Essential (primary) hypertension: Secondary | ICD-10-CM | POA: Diagnosis present

## 2021-12-06 DIAGNOSIS — Z955 Presence of coronary angioplasty implant and graft: Secondary | ICD-10-CM | POA: Diagnosis not present

## 2021-12-06 DIAGNOSIS — Z8249 Family history of ischemic heart disease and other diseases of the circulatory system: Secondary | ICD-10-CM | POA: Diagnosis not present

## 2021-12-06 DIAGNOSIS — K219 Gastro-esophageal reflux disease without esophagitis: Secondary | ICD-10-CM | POA: Diagnosis present

## 2021-12-06 DIAGNOSIS — N2889 Other specified disorders of kidney and ureter: Secondary | ICD-10-CM | POA: Diagnosis present

## 2021-12-06 DIAGNOSIS — Z823 Family history of stroke: Secondary | ICD-10-CM | POA: Diagnosis not present

## 2021-12-06 DIAGNOSIS — Z6831 Body mass index (BMI) 31.0-31.9, adult: Secondary | ICD-10-CM | POA: Diagnosis not present

## 2021-12-06 DIAGNOSIS — R079 Chest pain, unspecified: Secondary | ICD-10-CM | POA: Diagnosis present

## 2021-12-06 DIAGNOSIS — D49512 Neoplasm of unspecified behavior of left kidney: Secondary | ICD-10-CM | POA: Diagnosis present

## 2021-12-06 DIAGNOSIS — I739 Peripheral vascular disease, unspecified: Secondary | ICD-10-CM | POA: Diagnosis present

## 2021-12-06 LAB — BASIC METABOLIC PANEL
Anion gap: 5 (ref 5–15)
Anion gap: 5 (ref 5–15)
BUN: 17 mg/dL (ref 8–23)
BUN: 22 mg/dL (ref 8–23)
CO2: 25 mmol/L (ref 22–32)
CO2: 25 mmol/L (ref 22–32)
Calcium: 8.2 mg/dL — ABNORMAL LOW (ref 8.9–10.3)
Calcium: 8.2 mg/dL — ABNORMAL LOW (ref 8.9–10.3)
Chloride: 108 mmol/L (ref 98–111)
Chloride: 107 mmol/L (ref 98–111)
Creatinine, Ser: 1.15 mg/dL (ref 0.61–1.24)
Creatinine, Ser: 1.23 mg/dL (ref 0.61–1.24)
GFR, Estimated: >60 mL/min (ref >60)
GFR, Estimated: >60 mL/min (ref >60)
Glucose, Bld: 105 mg/dL — ABNORMAL HIGH (ref 70–99)
Glucose, Bld: 96 mg/dL (ref 70–99)
Potassium: 3.7 mmol/L (ref 3.5–5.1)
Potassium: 3.6 mmol/L (ref 3.5–5.1)
Sodium: 138 mmol/L (ref 135–145)
Sodium: 137 mmol/L (ref 135–145)

## 2021-12-06 LAB — CBC
HCT: 35.9 % — ABNORMAL LOW (ref 39.0–52.0)
HCT: 35.2 % — ABNORMAL LOW (ref 39.0–52.0)
Hemoglobin: 11.7 g/dL — ABNORMAL LOW (ref 13.0–17.0)
Hemoglobin: 11.6 g/dL — ABNORMAL LOW (ref 13.0–17.0)
MCH: 31.5 pg (ref 26.0–34.0)
MCH: 32.3 pg (ref 26.0–34.0)
MCHC: 32.6 g/dL (ref 30.0–36.0)
MCHC: 33 g/dL (ref 30.0–36.0)
MCV: 96.8 fL (ref 80.0–100.0)
MCV: 98.1 fL (ref 80.0–100.0)
Platelets: 118 10*3/uL — ABNORMAL LOW (ref 150–400)
Platelets: 103 10*3/uL — ABNORMAL LOW (ref 150–400)
RBC: 3.71 MIL/uL — ABNORMAL LOW (ref 4.22–5.81)
RBC: 3.59 MIL/uL — ABNORMAL LOW (ref 4.22–5.81)
RDW: 12.3 % (ref 11.5–15.5)
RDW: 12.5 % (ref 11.5–15.5)
WBC: 12.9 10*3/uL — ABNORMAL HIGH (ref 4.0–10.5)
WBC: 9.7 10*3/uL (ref 4.0–10.5)
nRBC: 0 % (ref 0.0–0.2)
nRBC: 0 % (ref 0.0–0.2)

## 2021-12-06 LAB — GLUCOSE, CAPILLARY
Glucose-Capillary: 99 mg/dL (ref 70–99)
Glucose-Capillary: 114 mg/dL — ABNORMAL HIGH (ref 70–99)
Glucose-Capillary: 91 mg/dL (ref 70–99)
Glucose-Capillary: 145 mg/dL — ABNORMAL HIGH (ref 70–99)

## 2021-12-06 MED ORDER — ACETAMINOPHEN 10 MG/ML IV SOLN
1000.0000 mg | Freq: Four times a day (QID) | INTRAVENOUS | Status: DC
Start: 2021-12-06 — End: 2021-12-07
  Administered 2021-12-06 – 2021-12-07 (×3): 1000 mg via INTRAVENOUS
  Filled 2021-12-06 (×3): qty 100

## 2021-12-06 MED ORDER — CEFAZOLIN SODIUM-DEXTROSE 2-4 GM/100ML-% IV SOLN: 2.0000 g | INTRAVENOUS | Status: DC

## 2021-12-06 MED ORDER — CEFAZOLIN SODIUM-DEXTROSE 2-4 GM/100ML-% IV SOLN
2.0000 g | INTRAVENOUS | Status: DC
  Administered 2021-12-06: 2 g via INTRAVENOUS
  Filled 2021-12-06: qty 100

## 2021-12-06 NOTE — Progress Notes (Signed)
?  Transition of Care (TOC) Screening Note ? ? ?Patient Details  ?Name: Samuel Lawson ?Date of Birth: 1946-07-05 ? ? ?Transition of Care [facility]) CM/SW Contact:    ?Dessa Phi, RN ?Phone Number: ?12/06/2021, 11:42 AM ? ? ? ?Transition of Care Department [facility]) has reviewed patient and no TOC needs have been identified at this time. We will continue to monitor patient advancement through interdisciplinary progression rounds. If new patient transition needs arise, please place a TOC consult. ?  ?

## 2021-12-06 NOTE — Progress Notes (Signed)
?   12/06/21 1240  ?Unmeasured Output  ?Urine Occurrence 1  ?Urine Characteristics  ?Urinary Interventions Bladder scan  ?Bladder Scan Volume (mL) 104 mL  ? ?Patient has urinated 2 times since foley removal at 06:11 am today. Urine amount was unmeasured. Bladder scan performed by MD at bedside, volume showed 104 mL. ? ?Layla Maw, RN ?

## 2021-12-06 NOTE — Progress Notes (Signed)
?   12/06/21 0837  ?Mobility  ?Activity Ambulated independently in hallway  ?Range of Motion/Exercises Active;All extremities  ?Level of Assistance Contact guard assist, steadying assist  ?Assistive Device Front wheel walker  ?Distance Ambulated (ft) 180 ft  ?Activity Response Tolerated well  ? ?Layla Maw, RN ?

## 2021-12-06 NOTE — Progress Notes (Signed)
Urology Inpatient Progress Report ? ?Left renal mass [N28.89] ? ?Procedure(s): ?XI ROBOTIC ASSITED PARTIAL NEPHRECTOMY ? ?1 Day Post-Op ? ?Intv/Subj: ?Hypothermic post-op, improved with blankets ?Complaining of some chest pain -ECG normal, complaints consistent with post-op pain ?No acute events overnight. ?Sore when he coughs, afraid to breath deep because of pain ? ?Principal Problem: ?  Left renal mass ? ?Current Facility-Administered Medications  ?Medication Dose Route Frequency Provider Last Rate Last Admin  ? acetaminophen (OFIRMEV) IV 1,000 mg  1,000 mg Intravenous Q6H Ardis Hughs, MD 400 mL/hr at 12/06/21 0606 1,000 mg at 12/06/21 0606  ? albuterol (PROVENTIL) (2.5 MG/3ML) 0.083% nebulizer solution 2.5 mg  2.5 mg Nebulization Q6H PRN Reola Mosher, MD      ? atorvastatin (LIPITOR) tablet 40 mg  40 mg Oral QHS Reola Mosher, MD   40 mg at 12/05/21 2135  ? carvedilol (COREG) tablet 3.125 mg  3.125 mg Oral BID WC Reola Mosher, MD   3.125 mg at 12/05/21 1711  ? fluticasone (FLONASE) 50 MCG/ACT nasal spray 2 spray  2 spray Each Nare Daily PRN Reola Mosher, MD      ? HYDROmorphone (DILAUDID) injection 0.5 mg  0.5 mg Intravenous Q4H PRN Reola Mosher, MD   0.5 mg at 12/06/21 0310  ? insulin aspart (novoLOG) injection 0-15 Units  0-15 Units Subcutaneous TID WC Reola Mosher, MD   2 Units at 12/05/21 1710  ? insulin aspart (novoLOG) injection 0-5 Units  0-5 Units Subcutaneous QHS Reola Mosher, MD      ? nicotine (NICODERM CQ - dosed in mg/24 hours) patch 21 mg  21 mg Transdermal Daily Ardis Hughs, MD   21 mg at 12/05/21 1712  ? ondansetron (ZOFRAN) injection 4 mg  4 mg Intravenous Q4H PRN Reola Mosher, MD      ? oxyCODONE (Oxy IR/ROXICODONE) immediate release tablet 5-10 mg  5-10 mg Oral Q4H PRN Reola Mosher, MD      ? senna-docusate (Senokot-S) tablet 2 tablet  2 tablet Oral QHS Reola Mosher, MD   2 tablet at 12/05/21 2135  ? tamsulosin (FLOMAX) capsule 0.4 mg  0.4 mg Oral  Daily Reola Mosher, MD      ? ? ? ?Objective: ?Vital: ?Vitals:  ? 12/05/21 1642 12/05/21 1748 12/05/21 2144 12/06/21 0552  ?BP:  118/64 (!) 93/59 (!) 97/55  ?Pulse:  62 66 63  ?Resp:  '16 20 18  '$ ?Temp: (!) 97.3 ?F (36.3 ?C) 98.1 ?F (36.7 ?C) 98.9 ?F (37.2 ?C) 99.2 ?F (37.3 ?C)  ?TempSrc: Rectal Oral Oral Oral  ?SpO2:  97% 95% 95%  ?Weight:      ?Height:      ? ?I/Os: ?I/O last 3 completed shifts: ?In: 3675.3 [P.O.:360; I.V.:2453.2; IV Piggyback:862.1] ?Out: 1710 [Urine:1350; Drains:110; Blood:250] ? ?Physical Exam:  ?General: Patient is in no apparent distress ?Lungs: Normal respiratory effort, chest expands symmetrically. ?GI: Incisions are c/d/i. The abdomen is soft and appropriately tender. ?JP drain with serosanguinous drainage ?Foley: out  ?Ext: lower extremities symmetric ? ?Lab Results: ?Recent Labs  ?  12/05/21 ?1402 12/06/21 ?0454  ?WBC 12.3* 12.9*  ?HGB 13.9 11.7*  ?HCT 43.2 35.9*  ? ?Recent Labs  ?  12/05/21 ?1402 12/06/21 ?0454  ?NA 140 138  ?K 4.1 3.7  ?CL 108 108  ?CO2 27 25  ?GLUCOSE 155* 105*  ?BUN 17 17  ?CREATININE 1.17 1.15  ?CALCIUM 8.6* 8.2*  ? ?No results for input(s): LABPT, INR in the last 72 hours. ?No results for input(s):  LABURIN in the last 72 hours. ?Results for orders placed or performed in visit on 01/07/20  ?Fecal occult blood, imunochemical([facility])     Status: Abnormal  ? Collection Time: 01/13/20 10:14 AM  ? Specimen: Stool  ? ST  ?Result Value Ref Range Status  ? Fecal Occult Bld Positive (A) Negative Final  ? ? ?Studies/Results: ?No results found. ? ?Assessment: ?Procedure(s): ?XI ROBOTIC ASSITED PARTIAL NEPHRECTOMY, 1 Day Post-Op  doing well. ? ?Plan: ?Wean O2 ?HLIVF ?D/C JP drain ?Ambulate ?Encourage independence ?Aggressive pain control ? ?Reassess this PM for discharge - may require one more night to be good enough to go home. ? ?Louis Meckel, MD ?Urology ?12/06/2021, 7:37 AM ? ?

## 2021-12-07 ENCOUNTER — Other Ambulatory Visit: Payer: Self-pay

## 2021-12-07 ENCOUNTER — Encounter (HOSPITAL_COMMUNITY): Payer: Self-pay | Admitting: Urology

## 2021-12-07 LAB — GLUCOSE, CAPILLARY
Glucose-Capillary: 110 mg/dL — ABNORMAL HIGH (ref 70–99)
Glucose-Capillary: 103 mg/dL — ABNORMAL HIGH (ref 70–99)

## 2021-12-07 LAB — BASIC METABOLIC PANEL
Anion gap: 5 (ref 5–15)
BUN: 18 mg/dL (ref 8–23)
CO2: 24 mmol/L (ref 22–32)
Calcium: 8.2 mg/dL — ABNORMAL LOW (ref 8.9–10.3)
Chloride: 109 mmol/L (ref 98–111)
Creatinine, Ser: 1.17 mg/dL (ref 0.61–1.24)
GFR, Estimated: >60 mL/min (ref >60)
Glucose, Bld: 99 mg/dL (ref 70–99)
Potassium: 3.5 mmol/L (ref 3.5–5.1)
Sodium: 138 mmol/L (ref 135–145)

## 2021-12-07 LAB — SURGICAL PATHOLOGY

## 2021-12-07 LAB — CBC
HCT: 34.2 % — ABNORMAL LOW (ref 39.0–52.0)
Hemoglobin: 10.9 g/dL — ABNORMAL LOW (ref 13.0–17.0)
MCH: 31.6 pg (ref 26.0–34.0)
MCHC: 31.9 g/dL (ref 30.0–36.0)
MCV: 99.1 fL (ref 80.0–100.0)
Platelets: 99 10*3/uL — ABNORMAL LOW (ref 150–400)
RBC: 3.45 MIL/uL — ABNORMAL LOW (ref 4.22–5.81)
RDW: 12.5 % (ref 11.5–15.5)
WBC: 8.5 10*3/uL (ref 4.0–10.5)
nRBC: 0 % (ref 0.0–0.2)

## 2021-12-07 LAB — TROPONIN I (HIGH SENSITIVITY): Troponin I (High Sensitivity): 11 ng/L (ref <18)

## 2021-12-07 MED ORDER — NICOTINE 21 MG/24HR TD PT24
21.0000 mg | MEDICATED_PATCH | Freq: Every day | TRANSDERMAL | 0 refills | Status: DC
  Filled 2021-12-07 – 2021-12-10 (×2): qty 28, 28d supply, fill #0

## 2021-12-07 MED ORDER — TRAMADOL HCL 50 MG PO TABS
50.0000 mg | ORAL_TABLET | Freq: Four times a day (QID) | ORAL | 0 refills | Status: DC | PRN
  Filled 2021-12-07: qty 20, 3d supply, fill #0

## 2021-12-07 MED ORDER — BISACODYL 10 MG RE SUPP
10.0000 mg | Freq: Every day | RECTAL | Status: DC | PRN
  Filled 2021-12-07: qty 1

## 2021-12-07 NOTE — Progress Notes (Addendum)
Urology Progress Note  ? ?2 Days Post-Op from left robotic partial nephrectomy.  ? ?Subjective: ?NAEON.  ?Pain controlled with PRN medications. Still primary complaint is pain. ?Ambulated in hall and to bathroom yesterday. ?Weaned off O2 ?Voiding spontaneously. ?Complaining of some chest pain this AM. ?Denies N/V. Tolerating PO well. ? ?Objective: ?Vital signs in last 24 hours: ?Temp:  [98 ?F (36.7 ?C)-98.6 ?F (37 ?C)] 98.2 ?F (36.8 ?C) (03/31 0650) ?Pulse Rate:  [57-76] 76 (03/31 0650) ?Resp:  [17-20] 20 (03/31 0650) ?BP: (102-126)/(59-69) 126/69 (03/31 0650) ?SpO2:  [90 %-94 %] 90 % (03/31 0650) ? ?Intake/Output from previous day: ?03/30 0701 - 03/31 0700 ?In: 700 [P.O.:600; IV Piggyback:100] ?Out: 15 [Drains:15] ?Intake/Output this shift: ?No intake/output data recorded. ? ?Physical Exam:  ?General: Alert and oriented ?CV: Regular rate ?Lungs: No increased work of breathing ?Abdomen: Soft, appropriately tender. Incisions c/d/i. JP site dry. ?GU: Voiding spontaneously. ?Ext: NT, No erythema ? ?Lab Results: ?Recent Labs  ?  12/06/21 ?1610 12/06/21 ?1718 12/07/21 ?0452  ?HGB 11.7* 11.6* 10.9*  ?HCT 35.9* 35.2* 34.2*  ? ?Recent Labs  ?  12/06/21 ?1718 12/07/21 ?0452  ?NA 137 138  ?K 3.6 3.5  ?CL 107 109  ?CO2 25 24  ?GLUCOSE 96 99  ?BUN 22 18  ?CREATININE 1.23 1.17  ?CALCIUM 8.2* 8.2*  ? ? ?Studies/Results: ?No results found. ? ?Assessment/Plan: ? ?76 y.o. male s/p left robotic partial nephrectomy.  Overall doing well post-op. Vitals and lab work are reassuring. Primary barrier to discharge is pain control and patient motivation. Some chest pain this AM but suspect likely reflux. Will obtain EKG and troponin to be sure not cardiac.  ? ?- Cont carb consistent diet ?- Medlocked ?- OOB/Chair/Ambulation ?- Follow up EKG and Trop for chest pain ? ?Dispo: Discharge today versus tomorrow ? ? LOS: 1 day  ? ? ?Addendum:  ?Troponin is normal, ECG shows first degree block with no evidence of ischemia.  Patient encouraged to  ambulate and reassured that his heart, kidney function and labs are all reassuring.  Will plan for discharge today. ? ? ?

## 2021-12-07 NOTE — Progress Notes (Signed)
Patient has been taught and explained discharge instructions. Patient has no further questions at this time. IV of the left and right hands have been removed. Both sites are clean, dry and intact. ? ?Layla Maw, RN ?

## 2021-12-07 NOTE — Discharge Summary (Signed)
Date of admission: 12/05/2021 ? ?Date of discharge: 12/07/2021 ? ?Admission diagnosis: Left renal mass ? ?Discharge diagnosis: Left renal mass ? ?Secondary diagnoses:  ?Patient Active Problem List  ? Diagnosis Date Noted  ? Renal mass, left 12/06/2021  ? Left renal mass 12/05/2021  ? Upper respiratory tract infection 10/31/2021  ? Thrombocytopenia (HCC) 01/07/2020  ? Eustachian tube dysfunction, bilateral 11/17/2019  ? 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 Impacted cerumen of both ears 03/19/2018  ? 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 Left medial knee pain 01/22/2017  ? Left arm pain 11/19/2016  ? Dyshidrotic hand dermatitis 01/22/2016  ? BPH (benign prostatic hyperplasia) 07/19/2015  ? Nicotine dependence 06/22/2015  ? Orthostatic hypotension 12/14/2014  ? Vertigo 11/29/2014  ? Gastroesophageal reflux disease without esophagitis 03/09/2014  ? Essential hypertension 03/09/2014  ? Coronary artery disease involving native coronary artery of native heart without angina pectoris 08/25/2013  ? Hyperlipidemia LDL goal <100 08/25/2013  ? Obesity (BMI 30-39.9) 08/25/2013  ? Disequilibrium 08/25/2013  ? ? ?Procedures performed: ?Procedure(s): ?XI ROBOTIC ASSITED PARTIAL NEPHRECTOMY ? ?History and Physical: For full details, please see admission history and physical. Briefly, Teagen Annett is a 76 y.o. year old patient with Hx of left renal mass. He met with Urology as an outpatient and elected to proceed with left robotic partial nephrectomy.  ? ?Hospital Course: Patient tolerated the procedure well.  He was then transferred to the floor after an uneventful PACU stay.  His hospital course was uncomplicated.  On POD#2 he had met discharge criteria: was eating a regular diet, was up and ambulating independently,  pain was well controlled, was voiding without a catheter, and was ready to for discharge. ? ? ?Laboratory values:   ?Recent Labs  ?  12/06/21 ?0454 12/06/21 ?1718 12/07/21 ?0452  ?WBC 12.9* 9.7 8.5  ?HGB 11.7* 11.6* 10.9*  ?HCT 35.9* 35.2* 34.2*  ? ?Recent Labs  ?  12/06/21 ?0454 12/06/21 ?1718 12/07/21 ?0452  ?NA 138 137 138  ?K 3.7 3.6 3.5  ?CL 108 107 109  ?CO2 25 25 24  ?GLUCOSE 105* 96 99  ?BUN 17 22 18  ?CREATININE 1.15 1.23 1.17  ?CALCIUM 8.2* 8.2* 8.2*  ? ?No results for input(s): LABPT, INR in the last 72 hours. ?No results for input(s): LABURIN in the last 72 hours. ?Results for orders placed or performed in visit on 01/07/20  ?Fecal occult blood, imunochemical([facility])     Status: Abnormal  ? Collection Time: 01/13/20 10:14 AM  ? Specimen: Stool  ? ST  ?Result Value Ref Range Status  ? Fecal Occult Bld Positive (A) Negative Final  ? ? ?Disposition: Home ? ?Discharge instruction: The patient was instructed to be ambulatory but told to refrain from heavy lifting, strenuous activity, or driving. ? ?Discharge medications:  ?Allergies as of 12/07/2021   ?No Known Allergies ?  ? ?  ?Medication List  ?  ? ?TAKE these medications   ? ?Accu-Chek Guide Control Liqd ?1 each by In Vitro route once as needed for up to 1 dose. R73.03 ?  ?Accu-Chek Guide test strip ?Generic drug: glucose blood ?Use as instructed. Check blood glucose by fingerstick once per day. R73.03 ?  ?albuterol 108 (90 Base) MCG/ACT inhaler ?Commonly known as: VENTOLIN HFA ?Inhale 1-2 puffs into the lungs every 6 (six) hours as needed for wheezing or shortness of breath. Or persistent cough ?  ?aspirin EC 81 MG tablet ?Take   81 mg by mouth daily. Swallow whole. ?  ?atorvastatin 40 MG tablet ?Commonly known as: LIPITOR ?TAKE 1 TABLET (40 MG TOTAL) BY MOUTH AT BEDTIME. ?  ?carvedilol 3.125 MG tablet ?Commonly known as: COREG ?Take 1 tablet (3.125 mg total) by mouth 2 (two) times daily with a meal. ?  ?cetirizine 10 MG tablet ?Commonly known as: ZYRTEC ?Take 1 tablet (10 mg total) by mouth daily. ?  ?fluticasone 50 MCG/ACT nasal spray ?Commonly known as:  FLONASE ?Place 2 sprays into both nostrils daily. ?What changed:  ?when to take this ?reasons to take this ?  ?multivitamin with minerals tablet ?Take 1 tablet by mouth daily. ?  ?nicotine 21 mg/24hr patch ?Commonly known as: NICODERM CQ - dosed in mg/24 hours ?Place 1 patch (21 mg total) onto the skin daily. ?Start taking on: December 08, 2021 ?  ?omega-3 acid ethyl esters 1 g capsule ?Commonly known as: LOVAZA ?Take 1 capsule (1 g total) by mouth 2 (two) times daily. ?  ?omega-3 acid ethyl esters 1 g capsule ?Commonly known as: LOVAZA ?TAKE 1 CAPSULE (1 G TOTAL) BY MOUTH 2 (TWO) TIMES DAILY. ?  ?omeprazole 40 MG capsule ?Commonly known as: PRILOSEC ?Take 1 capsule (40 mg total) by mouth daily. ?  ?tamsulosin 0.4 MG Caps capsule ?Commonly known as: FLOMAX ?Take 2 capsules by mouth daily ?(take 2 capsules by mouth daily) ?What changed:  ?how much to take ?when to take this ?  ?traMADol 50 MG tablet ?Commonly known as: Ultram ?Take 1-2 tablets (50-100 mg total) by mouth every 6 (six) hours as needed for moderate pain. ?  ?Vitamin D (Ergocalciferol) 1.25 MG (50000 UNIT) Caps capsule ?Commonly known as: DRISDOL ?Take 1 capsule (50,000 Units total) by mouth every 7 (seven) days. ?  ?Vitamin D3 125 MCG (5000 UT) Caps ?Take 10,000 Units by mouth daily. ?  ? ?  ? ? ?Followup:  ? Follow-up Information   ? ? Gibson, Larry Ryan, NP Follow up on 12/19/2021.   ?Specialty: Nurse Practitioner ?Why: 10 AM ?Contact information: ?[address] ?[address] ?[place] [address] ?[phone] ? ? ?  ?  ? ?  ?  ? ?  ? ? ? ?

## 2021-12-10 ENCOUNTER — Other Ambulatory Visit: Payer: Self-pay

## 2021-12-10 ENCOUNTER — Telehealth: Payer: Self-pay

## 2021-12-10 NOTE — Telephone Encounter (Signed)
Transition Care Management Follow-up Telephone Call ?Date of discharge and from where: 12/07/2021, [facility] ?How have you been since you were released from the hospital? He said that the first couple of days were rough but now he feels great.  ?Any questions or concerns? No ? ?Items Reviewed: ?Did the pt receive and understand the discharge instructions provided? Yes  ?Medications obtained and verified? Yes  - he said he has all medications except the nicotine patch which he plans to pick up today. He has a glucometer and said he checks his blood sugars regularly and they range between 98-102.  ?Other? No  ?Any new allergies since your discharge? No  ?Dietary orders reviewed? Yes ?Do you have support at home?  Lives alone but will ask for help if needed.  ? ?Home Care and Equipment/Supplies: ?Were home health services ordered? no ?If so, what is the name of the agency? N/a  ?Has the agency set up a time to come to the patient's home? not applicablen/a ?Were any new equipment or medical supplies ordered?  No ?What is the name of the medical supply agency? N/a ?Were you able to get the supplies/equipment? not applicable ?Do you have any questions related to the use of the equipment or supplies? No ? ?Functional Questionnaire: (I = Independent and D = Dependent) ?ADLs: independent but said he needs to obtain a new cane.  ? ? ? ?Follow up appointments reviewed: ? ?PCP Hospital f/u appt confirmed? Yes  Scheduled to see Geryl Rankins, NP - 01/23/2022.  He requested to be seen in May.   ?[facility] f/u appt confirmed? Yes  Scheduled to see urology- 12/19/2021; neurology - 12/31/2021.  ?Are transportation arrangements needed? No  ?If their condition worsens, is the pt aware to call PCP or go to the Emergency Dept.? Yes ?Was the patient provided with contact information for the PCP's office or ED? Yes ?Was to pt encouraged to call back with questions or concerns? Yes ? ?

## 2021-12-13 NOTE — Telephone Encounter (Signed)
Opened in error

## 2021-12-19 DIAGNOSIS — C642 Malignant neoplasm of left kidney, except renal pelvis: Secondary | ICD-10-CM | POA: Diagnosis not present

## 2021-12-25 ENCOUNTER — Other Ambulatory Visit: Payer: Self-pay

## 2021-12-27 ENCOUNTER — Other Ambulatory Visit: Payer: Self-pay

## 2022-01-02 ENCOUNTER — Encounter: Payer: Self-pay | Admitting: Neurology

## 2022-01-02 ENCOUNTER — Ambulatory Visit (INDEPENDENT_AMBULATORY_CARE_PROVIDER_SITE_OTHER): Payer: Medicare HMO | Admitting: Neurology

## 2022-01-02 VITALS — BP 106/63 | HR 75 | Ht 65.0 in | Wt 189.5 lb

## 2022-01-02 DIAGNOSIS — Z113 Encounter for screening for infections with a predominantly sexual mode of transmission: Secondary | ICD-10-CM | POA: Diagnosis not present

## 2022-01-02 DIAGNOSIS — R413 Other amnesia: Secondary | ICD-10-CM | POA: Diagnosis not present

## 2022-01-02 DIAGNOSIS — E538 Deficiency of other specified B group vitamins: Secondary | ICD-10-CM | POA: Diagnosis not present

## 2022-01-02 DIAGNOSIS — R7309 Other abnormal glucose: Secondary | ICD-10-CM | POA: Diagnosis not present

## 2022-01-02 DIAGNOSIS — R7989 Other specified abnormal findings of blood chemistry: Secondary | ICD-10-CM | POA: Diagnosis not present

## 2022-01-02 DIAGNOSIS — R799 Abnormal finding of blood chemistry, unspecified: Secondary | ICD-10-CM | POA: Diagnosis not present

## 2022-01-02 NOTE — Progress Notes (Signed)
? ?Chief Complaint  ?Patient presents with  ? New Patient (Initial Visit)  ?  Rm 15. Alone. ?NX Dr. Orson Aloe 2015/internal referral for Memory loss or impairment, Family history of Alzheimer's disease. ?[place] 22/30.  ? ? ? ? ?ASSESSMENT AND PLAN ? ?Samuel Lawson is a 76 y.o. male   ?Mild cognitive impairment ? MoCA examination 22/30 ? Normal MRI of the brain ? Laboratory evaluation to rule out treatable etiology ? Emphasized importance of quitting smoking, keep moderate exercise, ? Only return to clinic for worsening symptoms ? ? ?DIAGNOSTIC DATA (LABS, IMAGING, TESTING) ?- I reviewed patient records, labs, notes, testing and imaging myself where available. ? ? ?MEDICAL HISTORY: ? ?Samuel Lawson is a 76 year old male, seen in request by his primary care nurse practitioner Geryl Rankins W for evaluation of memory loss ? ?I reviewed and summarized the referring note. PMHX. 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 ?Pre-Dm ? 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 ?He has very healthy eating habit, exercise regularly, good sleep quality, he does still smoke ? ?MoCA examination 22/30 ? ?We personally reviewed MRI of the brain in January 2021, no acute intracranial abnormality ? ?PHYSICAL EXAM: ?  ?Vitals:  ? 01/02/22 1122  ?BP: 106/63  ?Pulse: 75  ?Weight: 189 lb 8 oz (86 kg)  ?Height: '5\' 5"'$  (1.651 m)  ? ?Not recorded ?  ? ? ?Body mass index is 31.53 kg/m?. ? ?PHYSICAL EXAMNIATION: ? ?Gen: NAD, conversant, well nourised, well groomed                     ?Cardiovascular: Regular rate rhythm, no peripheral edema, warm, nontender. ?Eyes: Conjunctivae clear without exudates or hemorrhage ?Neck: Supple, no carotid bruits. ?Pulmonary: Clear to auscultation bilaterally  ? ?NEUROLOGICAL EXAM: ? ?MENTAL  STATUS: ?Speech: ?   Speech is normal; fluent and spontaneous with normal comprehension.  ?Cognition: ? ?  01/02/2022  ? 11:25 AM  ?Montreal Cognitive Assessment   ?Visuospatial/ Executive (0/5) 5  ?Naming (0/3) 2  ?Attention: Read list of digits (0/2) 1  ?Attention: Read list of letters (0/1) 1  ?Attention: Serial 7 subtraction starting at 100 (0/3) 3  ?Language: Repeat phrase (0/2) 1  ?Language : Fluency (0/1) 0  ?Abstraction (0/2) 2  ?Delayed Recall (0/5) 1  ?Orientation (0/6) 6  ?Total 22  ?Adjusted Score (based on education) 22  ?  ?  ?CRANIAL NERVES: ?CN II: Visual fields are full to confrontation. Pupils are round equal and briskly reactive to light. ?CN III, IV, VI: extraocular movement are normal. No ptosis. ?CN V: Facial sensation is intact to light touch ?CN VII: Face is symmetric with normal eye closure  ?CN VIII: Hearing is normal to causal conversation. ?CN IX, X: Phonation is normal. ?CN XI: Head turning and shoulder shrug are intact ? ?MOTOR: ?There is no pronator drift of out-stretched arms. Muscle bulk and tone are normal. Muscle strength is normal. ? ?REFLEXES: ?Reflexes are 2+ and symmetric at the biceps, triceps, knees, and ankles. Plantar responses are flexor. ? ?SENSORY: ?Intact to light touch, pinprick and vibratory sensation are intact in fingers and toes. ? ?COORDINATION: ?There is no trunk or limb dysmetria noted. ? ?GAIT/STANCE: ?Posture is normal. Gait is steady with normal steps, base, arm swing, and turning. Heel and  toe walking are normal. Tandem gait is normal.  ?Romberg is absent. ? ?REVIEW OF SYSTEMS:  ?Full 14 system review of systems performed and notable only for as above ?All other review of systems were negative. ? ? ?ALLERGIES: ?No Known Allergies ? ?HOME MEDICATIONS: ?Current Outpatient Medications  ?Medication Sig Dispense Refill  ? aspirin EC 81 MG tablet Take 81 mg by mouth daily. Swallow whole.    ? atorvastatin (LIPITOR) 40 MG tablet TAKE 1 TABLET (40 MG TOTAL) BY MOUTH  AT BEDTIME. 90 tablet 2  ? Blood Glucose Calibration (ACCU-CHEK GUIDE CONTROL) LIQD 1 each by In Vitro route once as needed for up to 1 dose. R73.03 1 each 0  ? carvedilol (COREG) 3.125 MG tablet Take 1 tablet (3.125 mg total) by mouth 2 (two) times daily with a meal. 180 tablet 1  ? Cholecalciferol (VITAMIN D3) 125 MCG (5000 UT) CAPS Take 10,000 Units by mouth daily.    ? fluticasone (FLONASE) 50 MCG/ACT nasal spray Place 2 sprays into both nostrils daily. (Patient taking differently: Place 2 sprays into both nostrils daily as needed for allergies.) 18 g 6  ? glucose blood (ACCU-CHEK GUIDE) test strip Use as instructed. Check blood glucose by fingerstick once per day. R73.03 100 each 0  ? Multiple Vitamins-Minerals (MULTIVITAMIN WITH MINERALS) tablet Take 1 tablet by mouth daily.    ? omega-3 acid ethyl esters (LOVAZA) 1 g capsule TAKE 1 CAPSULE (1 G TOTAL) BY MOUTH 2 (TWO) TIMES DAILY. 180 capsule 1  ? tamsulosin (FLOMAX) 0.4 MG CAPS capsule take 2 capsules by mouth daily (Patient taking differently: Take 0.4 mg by mouth daily.) 60 capsule 11  ? ?No current facility-administered medications for this visit.  ? ? ?PAST MEDICAL HISTORY: ?Past Medical History:  ?Diagnosis Date  ? 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 STENTS PLACED  ? Anginal pain ([place])   ? Chronic kidney disease   ? Collagen vascular disease ([place])   ? Hyperlipidemia   ? Hypertension   ? Pre-diabetes   ? Prediabetes   ? ? ?PAST SURGICAL HISTORY: ?Past Surgical History:  ?Procedure Laterality Date  ? ANGIOPLASTY    ? CARDIAC CATHETERIZATION    ? ROBOTIC ASSITED PARTIAL NEPHRECTOMY Left 12/05/2021  ? Procedure: XI ROBOTIC ASSITED PARTIAL NEPHRECTOMY;  Surgeon: Ardis Hughs, MD;  Location: [facility];  Service: Urology;  Laterality: Left;  3.5 HRS  ? ? ?FAMILY HISTORY: ?Family History  ?Problem Relation Age of Onset  ? Stroke Father   ? Alzheimer's disease Father   ? High blood pressure Father   ? Heart Problems Mother   ? ? ?SOCIAL HISTORY: ?Social History   ? ?Socioeconomic History  ? Marital status: Single  ?  Spouse name: Not on file  ? Number of children: 2  ? Years of education: B.S.  ? Highest education level: Not on file  ?Occupational History  ?  Employer: SELF EMPLOYED  ?  Comment: Computers - Works for [employer]  ?Tobacco Use  ? Smoking status: Every Day  ?  Packs/day: 1.00  ?  Types: Cigarettes  ? Smokeless tobacco: Never  ? Tobacco comments:  ?  states new years resolution is to quit smoking  ?Vaping Use  ? Vaping Use: Never used  ?Substance and Sexual Activity  ? Alcohol use: No  ? Drug use: No  ? Sexual activity: Not on file  ?Other Topics Concern  ? Not on file  ?Social History Narrative  ? Patient lives at home alone and  he is single. Reports being married in the past. Patient is self employed and works with computers.  ? Education B.S.  ? Right handed.  ? Caffeine one cup of tea daily and two pepsi daily.  ? ?Social Determinants of Health  ? ?Financial Resource Strain: Not on file  ?Food Insecurity: Not on file  ?Transportation Needs: Not on file  ?Physical Activity: Not on file  ?Stress: Not on file  ?Social Connections: Not on file  ?Intimate Partner Violence: Not on file  ? ? ? ? ?Marcial Pacas, M.D. Ph.D. ? ?[facility] ?[address] ?[place], [place] [address] ?Ph: [phone] ?Fax: [phone] ? ?CC:  Gildardo Pounds, NP ?[address] ?[address] ?[place],  [place] [address]  Gildardo Pounds, NP   ?

## 2022-01-03 ENCOUNTER — Telehealth: Payer: Self-pay | Admitting: *Deleted

## 2022-01-03 LAB — TSH: TSH: 2.27 u[IU]/mL (ref 0.450–4.500)

## 2022-01-03 LAB — VITAMIN B12: Vitamin B-12: 540 pg/mL (ref 232–1245)

## 2022-01-03 LAB — RPR: RPR Ser Ql: NONREACTIVE

## 2022-01-03 LAB — HGB A1C W/O EAG: Hgb A1c MFr Bld: 5.6 % (ref 4.8–5.6)

## 2022-01-03 NOTE — Telephone Encounter (Signed)
Left VM for normal results (as per DPR). ?

## 2022-01-03 NOTE — Telephone Encounter (Signed)
The patient returned phone call. He understood his lab results are within normal limits, however he would like to know what he can do to reduce his chances of getting dementia. He inquired about starting a B12 supplement. He would like to be advised on next steps. ?

## 2022-01-03 NOTE — Telephone Encounter (Signed)
I spoke with the patient and relayed the message. He stated he has a nicotine patch and is trying to quit smoking. He said he was physically active and ate healthy. He denied excessive snoring, daytime sleepiness, and fatigue. He reported going to sleep at 11 PM at night and waking around 7:30 AM - 8:00 AM daily. ? ?He asked again if his blood results were normal. I informed him again of his normal results. ? ?Pt verbalized understanding and expressed appreciation for the call. All questions answered. ?

## 2022-01-03 NOTE — Telephone Encounter (Signed)
Please let patient know, his B12 level was within normal limit, in addition, he has been eating healthy diet, no need for extra B12 supplement, he may benefit 1 tablet of multivitamin every day ?We already had extensive discussion for " brain health" during visit ? ?1. He should stop smoking ?2.  Be physically active ?3.  Increase water intake ?4.  Eat healthy ?5.  Catch enough sleep (at least 7 hours or longer) ?Asked to see if he has excessive snoring, daytime sleepiness, fatigue, if he does, may consider sleep referral ?

## 2022-01-03 NOTE — Telephone Encounter (Signed)
-----   Message from Marcial Pacas, MD sent at 01/03/2022  8:20 AM EDT ----- ?Please call patient for normal laboratory evaluations. ?

## 2022-01-23 ENCOUNTER — Ambulatory Visit: Payer: Medicare HMO | Admitting: Nurse Practitioner

## 2022-01-24 ENCOUNTER — Other Ambulatory Visit: Payer: Self-pay

## 2022-01-25 ENCOUNTER — Other Ambulatory Visit: Payer: Self-pay

## 2022-01-30 ENCOUNTER — Other Ambulatory Visit: Payer: Self-pay

## 2022-01-31 ENCOUNTER — Other Ambulatory Visit: Payer: Self-pay

## 2022-01-31 ENCOUNTER — Other Ambulatory Visit: Payer: Self-pay | Admitting: Pharmacist

## 2022-01-31 DIAGNOSIS — I2510 Atherosclerotic heart disease of native coronary artery without angina pectoris: Secondary | ICD-10-CM

## 2022-01-31 MED ORDER — CARVEDILOL 3.125 MG PO TABS
3.1250 mg | ORAL_TABLET | Freq: Two times a day (BID) | ORAL | 0 refills | Status: DC
  Filled 2022-01-31: qty 60, 30d supply, fill #0

## 2022-02-25 ENCOUNTER — Other Ambulatory Visit: Payer: Self-pay

## 2022-02-27 ENCOUNTER — Other Ambulatory Visit: Payer: Self-pay

## 2022-02-28 ENCOUNTER — Telehealth: Payer: Self-pay | Admitting: Pharmacist

## 2022-03-07 ENCOUNTER — Other Ambulatory Visit: Payer: Self-pay

## 2022-03-07 ENCOUNTER — Ambulatory Visit: Payer: Medicare HMO | Attending: Nurse Practitioner | Admitting: Family Medicine

## 2022-03-07 ENCOUNTER — Encounter: Payer: Self-pay | Admitting: Family Medicine

## 2022-03-07 VITALS — BP 118/70 | HR 60 | Temp 97.8°F | Ht 65.0 in | Wt 188.6 lb

## 2022-03-07 DIAGNOSIS — E785 Hyperlipidemia, unspecified: Secondary | ICD-10-CM

## 2022-03-07 DIAGNOSIS — Z1211 Encounter for screening for malignant neoplasm of colon: Secondary | ICD-10-CM

## 2022-03-07 DIAGNOSIS — I2510 Atherosclerotic heart disease of native coronary artery without angina pectoris: Secondary | ICD-10-CM

## 2022-03-07 DIAGNOSIS — R1032 Left lower quadrant pain: Secondary | ICD-10-CM | POA: Diagnosis not present

## 2022-03-07 DIAGNOSIS — F17290 Nicotine dependence, other tobacco product, uncomplicated: Secondary | ICD-10-CM | POA: Diagnosis not present

## 2022-03-07 DIAGNOSIS — K5909 Other constipation: Secondary | ICD-10-CM

## 2022-03-07 DIAGNOSIS — J329 Chronic sinusitis, unspecified: Secondary | ICD-10-CM

## 2022-03-07 MED ORDER — FLUTICASONE PROPIONATE 50 MCG/ACT NA SUSP
2.0000 | Freq: Every day | NASAL | 1 refills | Status: DC
  Filled 2022-03-07: qty 16, 30d supply, fill #0
  Filled 2022-04-10: qty 16, 30d supply, fill #1

## 2022-03-07 MED ORDER — CARVEDILOL 3.125 MG PO TABS
3.1250 mg | ORAL_TABLET | Freq: Two times a day (BID) | ORAL | 1 refills | Status: DC
  Filled 2022-03-07: qty 180, 90d supply, fill #0
  Filled 2022-06-07: qty 180, 90d supply, fill #1

## 2022-03-07 MED ORDER — ATORVASTATIN CALCIUM 40 MG PO TABS
ORAL_TABLET | Freq: Every day | ORAL | 1 refills | Status: DC
  Filled 2022-03-07: qty 90, fill #0
  Filled 2022-03-26: qty 90, 90d supply, fill #0
  Filled 2022-07-02: qty 90, 90d supply, fill #1

## 2022-03-07 NOTE — Progress Notes (Signed)
Subjective:  Patient ID: Samuel Lawson, male    DOB: [birth year]  Age: 76 y.o. MRN: [id number]  CC: Hospitalization Follow-up   HPI Samuel Lawson is a 76 y.o. year old male with a history of hypertension, hyperlipidemia, renal cell carcinoma of left kidney (status post partial nephrectomy)  Interval History: In 74/2023 he underwent robotic assisted partial left nephrectomy. Pathology revealed: FINAL MICROSCOPIC DIAGNOSIS:   A. KIDNEY, LEFT, MASS, PARTIAL NEPHRECTOMY:  Clear cell renal cell carcinoma, nuclear grade 2, size 4.8 cm  Tumor is limited to the kidney (pT1b)  All margins of resection are negative for tumor   B. KIDNEY, LEFT, MASS, BASE, BIOPSY:  Benign renal parenchyma   He has not had a follow up with Urology since his surgery but has an appointment on 03/18/22. When he coughs he has pain in his L testicle but denies presence of a mass. States he has gained 15 lbs as he has been eating a lot of bread and sodas and feels that can explain his symptoms.  Endorses adherence with his antihypertensive and statin. 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   STENTS PLACED   Anginal pain ([place])    Chronic kidney disease    Collagen vascular disease ([place])    Hyperlipidemia    Hypertension    Pre-diabetes    Prediabetes     Past Surgical History:  Procedure Laterality Date   ANGIOPLASTY     CARDIAC CATHETERIZATION     ROBOTIC ASSITED PARTIAL NEPHRECTOMY Left 12/05/2021   Procedure: XI ROBOTIC ASSITED PARTIAL NEPHRECTOMY;  Surgeon: Ardis Hughs, MD;  Location: [facility];  Service: Urology;  Laterality: Left;  3.5 HRS    Family History  Problem Relation Age of Onset   Stroke Father    Alzheimer's disease Father    High blood pressure Father    Heart Problems Mother     Social History   Socioeconomic History   Marital status: Single    Spouse name: Not on file   Number of children: 2   Years of education: B.S.   Highest education  level: Not on file  Occupational History    Employer: SELF EMPLOYED    Comment: Computers - Works for [employer]  Tobacco Use   Smoking status: Every Day    Packs/day: 1.00    Types: Cigarettes   Smokeless tobacco: Never   Tobacco comments:    states new years resolution is to quit smoking  Vaping Use   Vaping Use: Never used  Substance and Sexual Activity   Alcohol use: No   Drug use: No   Sexual activity: Not on file  Other Topics Concern   Not on file  Social History Narrative   Patient lives at home alone and he is single. Reports being married in the past. Patient is self employed and works with computers.   Education B.S.   Right handed.   Caffeine one cup of tea daily and two pepsi daily.   Social Determinants of Health   Financial Resource Strain: Low Risk  (04/09/2018)   Overall Financial Resource Strain (CARDIA)    Difficulty of Paying Living Expenses: Not hard at all  Food Insecurity: No Food Insecurity (04/09/2018)   Hunger Vital Sign    Worried About Running Out of Food in the Last Year: Never true    Ran Out of Food in the Last Year: Never true  Transportation Needs: No Transportation Needs (04/09/2018)   PRAPARE -  Hydrologist (Medical): No    Lack of Transportation (Non-Medical): No  Physical Activity: Inactive (04/09/2018)   Exercise Vital Sign    Days of Exercise per Week: 0 days    Minutes of Exercise per Session: 0 min  Stress: No Stress Concern Present (04/09/2018)   [place]    Feeling of Stress : Not at all  Social Connections: Moderately Isolated (04/09/2018)   Social Connection and Isolation Panel [NHANES]    Frequency of Communication with Friends and Family: More than three times a week    Frequency of Social Gatherings with Friends and Family: More than three times a week    Attends Religious Services: Never    Marine scientist or Organizations: No     Attends Archivist Meetings: Never    Marital Status: Divorced    No Known Allergies  Outpatient Medications Prior to Visit  Medication Sig Dispense Refill   aspirin EC 81 MG tablet Take 81 mg by mouth daily. Swallow whole.     Blood Glucose Calibration (ACCU-CHEK GUIDE CONTROL) LIQD 1 each by In Vitro route once as needed for up to 1 dose. R73.03 1 each 0   Cholecalciferol (VITAMIN D3) 125 MCG (5000 UT) CAPS Take 10,000 Units by mouth daily.     glucose blood (ACCU-CHEK GUIDE) test strip Use as instructed. Check blood glucose by fingerstick once per day. R73.03 100 each 0   Multiple Vitamins-Minerals (MULTIVITAMIN WITH MINERALS) tablet Take 1 tablet by mouth daily.     omega-3 acid ethyl esters (LOVAZA) 1 g capsule TAKE 1 CAPSULE (1 G TOTAL) BY MOUTH 2 (TWO) TIMES DAILY. 180 capsule 1   tamsulosin (FLOMAX) 0.4 MG CAPS capsule take 2 capsules by mouth daily (Patient taking differently: Take 0.4 mg by mouth daily.) 60 capsule 11   atorvastatin (LIPITOR) 40 MG tablet TAKE 1 TABLET (40 MG TOTAL) BY MOUTH AT BEDTIME. 90 tablet 2   carvedilol (COREG) 3.125 MG tablet Take 1 tablet (3.125 mg total) by mouth 2 (two) times daily with a meal. 60 tablet 0   fluticasone (FLONASE) 50 MCG/ACT nasal spray Place 2 sprays into both nostrils daily. (Patient taking differently: Place 2 sprays into both nostrils daily as needed for allergies.) 18 g 6   No facility-administered medications prior to visit.     ROS Review of Systems  Constitutional:  Negative for activity change and appetite change.  HENT:  Negative for sinus pressure and sore throat.   Respiratory:  Negative for chest tightness, shortness of breath and wheezing.   Cardiovascular:  Negative for chest pain and palpitations.  Gastrointestinal:  Positive for constipation. Negative for abdominal distention and abdominal pain.  Genitourinary:  Positive for testicular pain.  Musculoskeletal: Negative.   Psychiatric/Behavioral:  Negative  for behavioral problems and dysphoric mood.     Objective:  BP 118/70   Pulse 60   Temp 97.8 F (36.6 C) (Oral)   Ht '5\' 5"'$  (1.651 m)   Wt 188 lb 9.6 oz (85.5 kg)   SpO2 97%   BMI 31.38 kg/m      03/07/2022   11:27 AM 01/02/2022   11:22 AM 12/07/2021    6:50 AM  BP/Weight  Systolic BP 026 378 588  Diastolic BP 70 63 69  Wt. (Lbs) 188.6 189.5   BMI 31.38 kg/m2 31.53 kg/m2       Physical Exam Constitutional:  Appearance: He is well-developed.  Cardiovascular:     Rate and Rhythm: Normal rate.     Heart sounds: Normal heart sounds. No murmur heard. Pulmonary:     Effort: Pulmonary effort is normal.     Breath sounds: Normal breath sounds. No wheezing or rales.  Chest:     Chest wall: No tenderness.  Abdominal:     General: Bowel sounds are normal. There is no distension.     Palpations: Abdomen is soft. There is no mass.     Tenderness: There is no abdominal tenderness.  Genitourinary:    Comments: Declines scrotal exam Musculoskeletal:        General: Normal range of motion.     Right lower leg: No edema.     Left lower leg: No edema.  Neurological:     Mental Status: He is alert and oriented to person, place, and time.  Psychiatric:        Mood and Affect: Mood normal.        Latest Ref Rng & Units 12/07/2021    4:52 AM 12/06/2021    5:18 PM 12/06/2021    4:54 AM  CMP  Glucose 70 - 99 mg/dL 99  96  105   BUN 8 - 23 mg/dL '18  22  17   '$ Creatinine 0.61 - 1.24 mg/dL 1.17  1.23  1.15   Sodium 135 - 145 mmol/L 138  137  138   Potassium 3.5 - 5.1 mmol/L 3.5  3.6  3.7   Chloride 98 - 111 mmol/L 109  107  108   CO2 22 - 32 mmol/L '24  25  25   '$ Calcium 8.9 - 10.3 mg/dL 8.2  8.2  8.2     Lipid Panel     Component Value Date/Time   CHOL 131 01/22/2021 1700   TRIG 166 (H) 01/22/2021 1700   HDL 54 01/22/2021 1700   CHOLHDL 2.4 01/22/2021 1700   CHOLHDL 3.3 01/22/2016 1655   VLDL 49 (H) 01/22/2016 1655   LDLCALC 49 01/22/2021 1700    CBC    Component  Value Date/Time   WBC 8.5 12/07/2021 0452   RBC 3.45 (L) 12/07/2021 0452   HGB 10.9 (L) 12/07/2021 0452   HGB 15.4 11/02/2021 0929   HCT 34.2 (L) 12/07/2021 0452   HCT 45.5 11/02/2021 0929   PLT 99 (L) 12/07/2021 0452   PLT 145 (L) 11/02/2021 0929   MCV 99.1 12/07/2021 0452   MCV 93 11/02/2021 0929   MCH 31.6 12/07/2021 0452   MCHC 31.9 12/07/2021 0452   RDW 12.5 12/07/2021 0452   RDW 12.1 11/02/2021 0929   LYMPHSABS 1.7 08/08/2021 1027   MONOABS 0.6 03/16/2019 1740   EOSABS 0.1 08/08/2021 1027   BASOSABS 0.0 08/08/2021 1027    Lab Results  Component Value Date   HGBA1C 5.6 01/02/2022    Assessment & Plan:  1. Dyslipidemia, goal LDL below 70 Controlled We will check lipid panel today Low-cholesterol diet - atorvastatin (LIPITOR) 40 MG tablet; TAKE 1 TABLET (40 MG TOTAL) BY MOUTH AT BEDTIME.  Dispense: 90 tablet; Refill: 1 - LP+Non-HDL Cholesterol  2. Coronary artery disease involving native coronary artery of native heart without angina pectoris Asymptomatic Risk factor modification - carvedilol (COREG) 3.125 MG tablet; Take 1 tablet (3.125 mg total) by mouth 2 (two) times daily with a meal.  Dispense: 180 tablet; Refill: 1  3. Other tobacco product nicotine dependence, uncomplicated Spent 3 minutes counseling on smoking cessation  4.  Other sinusitis - fluticasone (FLONASE) 50 MCG/ACT nasal spray; Place 2 sprays into both nostrils daily.  Dispense: 16 g; Refill: 1  5. Screening for colon cancer - Ambulatory referral to Gastroenterology  6. Left inguinal pain ? Hernia He declines examination of his scrotum and would rather wait until urology visit for examination  7. Other constipation Counseled on increasing fiber intake, water, use of laxatives like MiraLAX    Meds ordered this encounter  Medications   atorvastatin (LIPITOR) 40 MG tablet    Sig: TAKE 1 TABLET (40 MG TOTAL) BY MOUTH AT BEDTIME.    Dispense:  90 tablet    Refill:  1   carvedilol (COREG)  3.125 MG tablet    Sig: Take 1 tablet (3.125 mg total) by mouth 2 (two) times daily with a meal.    Dispense:  180 tablet    Refill:  1   fluticasone (FLONASE) 50 MCG/ACT nasal spray    Sig: Place 2 sprays into both nostrils daily.    Dispense:  16 g    Refill:  1    Follow-up: No follow-ups on file.       Charlott Rakes, MD, FAAFP. [facility] [place], [place]   03/07/2022, 12:20 PM

## 2022-03-07 NOTE — Progress Notes (Signed)
Pain in lower abdomen when he coughs.

## 2022-03-07 NOTE — Patient Instructions (Signed)

## 2022-03-08 LAB — LP+NON-HDL CHOLESTEROL
Cholesterol, Total: 146 mg/dL (ref 100–199)
HDL: 53 mg/dL (ref >39)
LDL Chol Calc (NIH): 76 mg/dL (ref 0–99)
Total Non-HDL-Chol (LDL+VLDL): 93 mg/dL (ref 0–129)
Triglycerides: 90 mg/dL (ref 0–149)
VLDL Cholesterol Cal: 17 mg/dL (ref 5–40)

## 2022-03-18 ENCOUNTER — Other Ambulatory Visit (HOSPITAL_COMMUNITY): Payer: Self-pay | Admitting: Nurse Practitioner

## 2022-03-18 ENCOUNTER — Encounter: Payer: Self-pay | Admitting: Gastroenterology

## 2022-03-18 ENCOUNTER — Ambulatory Visit (HOSPITAL_COMMUNITY)
Admission: RE | Admit: 2022-03-18 | Discharge: 2022-03-18 | Disposition: A | Discharge status: Home / Self Care | Payer: Medicare HMO | Source: Ambulatory Visit | Attending: Nurse Practitioner | Admitting: Nurse Practitioner

## 2022-03-18 DIAGNOSIS — M47814 Spondylosis without myelopathy or radiculopathy, thoracic region: Secondary | ICD-10-CM | POA: Diagnosis not present

## 2022-03-18 DIAGNOSIS — C642 Malignant neoplasm of left kidney, except renal pelvis: Secondary | ICD-10-CM

## 2022-03-26 ENCOUNTER — Other Ambulatory Visit: Payer: Self-pay

## 2022-03-26 ENCOUNTER — Other Ambulatory Visit: Payer: Self-pay | Admitting: Family Medicine

## 2022-03-26 DIAGNOSIS — C642 Malignant neoplasm of left kidney, except renal pelvis: Secondary | ICD-10-CM | POA: Diagnosis not present

## 2022-03-26 DIAGNOSIS — N2889 Other specified disorders of kidney and ureter: Secondary | ICD-10-CM | POA: Diagnosis not present

## 2022-03-26 DIAGNOSIS — N281 Cyst of kidney, acquired: Secondary | ICD-10-CM | POA: Diagnosis not present

## 2022-03-26 MED ORDER — OMEGA-3-ACID ETHYL ESTERS 1 G PO CAPS
ORAL_CAPSULE | ORAL | 1 refills | Status: DC
  Filled 2022-03-26: qty 180, 90d supply, fill #0
  Filled 2022-07-24: qty 180, 90d supply, fill #1

## 2022-03-27 ENCOUNTER — Other Ambulatory Visit: Payer: Self-pay

## 2022-03-28 ENCOUNTER — Other Ambulatory Visit: Payer: Self-pay

## 2022-04-02 DIAGNOSIS — K439 Ventral hernia without obstruction or gangrene: Secondary | ICD-10-CM | POA: Diagnosis not present

## 2022-04-02 DIAGNOSIS — D49512 Neoplasm of unspecified behavior of left kidney: Secondary | ICD-10-CM | POA: Diagnosis not present

## 2022-04-09 ENCOUNTER — Ambulatory Visit: Payer: Self-pay | Admitting: *Deleted

## 2022-04-09 NOTE — Telephone Encounter (Signed)
  Chief Complaint: Has hernia in stomach from kidney surgery that is getting bigger Symptoms: Started small but hernia is as big as a grapefruit now. Frequency: Started 2-3 weeks ago Pertinent Negatives: Patient denies it being hard or painful or sore Disposition: '[]'$ ED /'[]'$ Urgent Care (no appt availability in office) / '[x]'$ Appointment(In office/virtual)/ '[]'$  [facility]/ '[]'$ Home Care/ '[]'$ Refused Recommended Disposition /'[]'$ [facility]/ '[]'$  Follow-up with PCP Additional Notes: Appt. Made with Geryl Rankins, NP but it isn't until Oct. 2023.  He has an appt with the surgery center on Aug. 18, 2023 per pt.  Instructed to go to the ED.   Went over s/s to watch for.  Put on the wait list.

## 2022-04-09 NOTE — Telephone Encounter (Signed)
Reason for Disposition  Age > 60 years    Has hernia in stomach from kidney surgery getting bigger  Answer Assessment - Initial Assessment Questions 1. LOCATION: "Where does it hurt?"      I have a hernia that is big as a grapefruit in my stomach.   I had surgery and now I have a big hernia.   What should I do?   The surgery center has me scheduled for Aug. 18, 2023.   It's getting bigger every day.     That is the earliest appt. They could get me in.       Can we call the surgery center and see if they can see me earlier?   I let him know he would need to be seen by provider here first.  No appts available before he is scheduled to be seen at the surgical center on Aug. 18, 2023.  2. RADIATION: "Does the pain shoot anywhere else?" (e.g., chest, back)     No pain.   It's getting bigger in my stomach.     3. ONSET: "When did the pain begin?" (Minutes, hours or days ago)      Hernia started 2-3 weeks ago.    The stupid urologist did not tell me what to do and what not to do after surgery.   He did not tell me anything.   It started small and now it's as big as a grapefruit.   He referred me to the surgical center to have it fixed.   I had kidney surgery.    The place where they cut me the intestine is coming out into a hernia.  My brother is a doctor back at my home.   He said the dr. Should have given me something for the constipation and for the coughing.   Denies being sick but has a cough when he lays down.   4. SUDDEN: "Gradual or sudden onset?"     Gradually getting bigger 5. PATTERN "Does the pain come and go, or is it constant?"    - If it comes and goes: "How long does it last?" "Do you have pain now?"     (Note: Comes and goes means the pain is intermittent. It goes away completely between bouts.)    - If constant: "Is it getting better, staying the same, or getting worse?"      (Note: Constant means the pain never goes away completely; most serious pain is constant and gets worse.)       No pain.     I can push it in.  It's soft when I lay down. 6. SEVERITY: "How bad is the pain?"  (e.g., Scale 1-10; mild, moderate, or severe)    - MILD (1-3): Doesn't interfere with normal activities, abdomen soft and not tender to touch.     - MODERATE (4-7): Interferes with normal activities or awakens from sleep, abdomen tender to touch.     - SEVERE (8-10): Excruciating pain, doubled over, unable to do any normal activities.       No pain 7. RECURRENT SYMPTOM: "Have you ever had this type of stomach pain before?" If Yes, ask: "When was the last time?" and "What happened that time?"      No 8. CAUSE: "What do you think is causing the stomach pain?"     It's from where I had kidney surgery. 9. RELIEVING/AGGRAVATING FACTORS: "What makes it better or worse?" (e.g., antacids, bending or twisting motion,  bowel movement)     Laying down makes it go down 10. OTHER SYMPTOMS: "Do you have any other symptoms?" (e.g., back pain, diarrhea, fever, urination pain, vomiting)       Constipation.  Protocols used: Abdominal Pain - Male-A-AH

## 2022-04-10 ENCOUNTER — Other Ambulatory Visit: Payer: Self-pay

## 2022-04-10 ENCOUNTER — Emergency Department (HOSPITAL_COMMUNITY)
Admission: EM | Admit: 2022-04-10 | Discharge: 2022-04-11 | Disposition: A | Discharge status: Home / Self Care | Payer: Medicare HMO | Attending: Emergency Medicine | Admitting: Emergency Medicine

## 2022-04-10 ENCOUNTER — Encounter (HOSPITAL_COMMUNITY): Payer: Self-pay | Admitting: *Deleted

## 2022-04-10 DIAGNOSIS — Z7982 Long term (current) use of aspirin: Secondary | ICD-10-CM | POA: Diagnosis not present

## 2022-04-10 DIAGNOSIS — K458 Other specified abdominal hernia without obstruction or gangrene: Secondary | ICD-10-CM | POA: Insufficient documentation

## 2022-04-10 DIAGNOSIS — K469 Unspecified abdominal hernia without obstruction or gangrene: Secondary | ICD-10-CM | POA: Diagnosis not present

## 2022-04-10 DIAGNOSIS — I700 Atherosclerosis of aorta: Secondary | ICD-10-CM | POA: Diagnosis not present

## 2022-04-10 DIAGNOSIS — R109 Unspecified abdominal pain: Secondary | ICD-10-CM | POA: Diagnosis present

## 2022-04-10 LAB — COMPREHENSIVE METABOLIC PANEL
ALT: 15 U/L (ref 0–44)
AST: 16 U/L (ref 15–41)
Albumin: 3.9 g/dL (ref 3.5–5.0)
Alkaline Phosphatase: 55 U/L (ref 38–126)
Anion gap: 8 (ref 5–15)
BUN: 22 mg/dL (ref 8–23)
CO2: 24 mmol/L (ref 22–32)
Calcium: 9.1 mg/dL (ref 8.9–10.3)
Chloride: 109 mmol/L (ref 98–111)
Creatinine, Ser: 0.92 mg/dL (ref 0.61–1.24)
GFR, Estimated: >60 mL/min (ref >60)
Glucose, Bld: 112 mg/dL — ABNORMAL HIGH (ref 70–99)
Potassium: 3.8 mmol/L (ref 3.5–5.1)
Sodium: 141 mmol/L (ref 135–145)
Total Bilirubin: 1.1 mg/dL (ref 0.3–1.2)
Total Protein: 6.8 g/dL (ref 6.5–8.1)

## 2022-04-10 LAB — I-STAT CHEM 8, ED
BUN: 23 mg/dL (ref 8–23)
Calcium, Ion: 1.19 mmol/L (ref 1.15–1.40)
Chloride: 107 mmol/L (ref 98–111)
Creatinine, Ser: 1 mg/dL (ref 0.61–1.24)
Glucose, Bld: 108 mg/dL — ABNORMAL HIGH (ref 70–99)
HCT: 45 % (ref 39.0–52.0)
Hemoglobin: 15.3 g/dL (ref 13.0–17.0)
Potassium: 3.9 mmol/L (ref 3.5–5.1)
Sodium: 142 mmol/L (ref 135–145)
TCO2: 23 mmol/L (ref 22–32)

## 2022-04-10 LAB — CBC WITH DIFFERENTIAL/PLATELET
Abs Immature Granulocytes: 0.03 10*3/uL (ref 0.00–0.07)
Basophils Absolute: 0 10*3/uL (ref 0.0–0.1)
Basophils Relative: 0 %
Eosinophils Absolute: 0.2 10*3/uL (ref 0.0–0.5)
Eosinophils Relative: 2 %
HCT: 46.4 % (ref 39.0–52.0)
Hemoglobin: 15.2 g/dL (ref 13.0–17.0)
Immature Granulocytes: 0 %
Lymphocytes Relative: 20 %
Lymphs Abs: 1.6 10*3/uL (ref 0.7–4.0)
MCH: 30.7 pg (ref 26.0–34.0)
MCHC: 32.8 g/dL (ref 30.0–36.0)
MCV: 93.7 fL (ref 80.0–100.0)
Monocytes Absolute: 0.7 10*3/uL (ref 0.1–1.0)
Monocytes Relative: 9 %
Neutro Abs: 5.3 10*3/uL (ref 1.7–7.7)
Neutrophils Relative %: 69 %
Platelets: 120 10*3/uL — ABNORMAL LOW (ref 150–400)
RBC: 4.95 MIL/uL (ref 4.22–5.81)
RDW: 12.9 % (ref 11.5–15.5)
WBC: 7.7 10*3/uL (ref 4.0–10.5)
nRBC: 0 % (ref 0.0–0.2)

## 2022-04-10 LAB — LIPASE, BLOOD: Lipase: 28 U/L (ref 11–51)

## 2022-04-10 NOTE — Telephone Encounter (Signed)
Patient name and DOB verified.   He was informed of PCP recommendations.  Will f/u at [facility].

## 2022-04-10 NOTE — ED Provider Triage Note (Signed)
Emergency Medicine Provider Triage Evaluation Note  Samuel Lawson , a 76 y.o. male  was evaluated in triage.  Pt complains of abdominal pain from his hernia pressure for the past week.  Denies any nausea or vomiting.  Patient reports that he was seen by a surgeon after his nephrectomy in March and was told that he may need surgery later.  He had appointment 3 weeks later in August.  Patient reports he was constipated this morning.  Review of Systems  Positive:  Negative:   Physical Exam  BP 128/72   Pulse (!) 59   Temp 98.2 F (36.8 C) (Oral)   Resp 20   Ht '5\' 5"'$  (1.651 m)   Wt 85.5 kg   SpO2 98%   BMI 31.37 kg/m  Gen:   Awake, no distress   Resp:  Normal effort  MSK:   Moves extremities without difficulty  Other:  Large central hernia that is reducible abdomen  Medical Decision Making  Medically screening exam initiated at 6:43 PM.  Appropriate orders placed.  Gurshaan Matsuoka was informed that the remainder of the evaluation will be completed by another provider, this initial triage assessment does not replace that evaluation, and the importance of remaining in the ED until their evaluation is complete.  We will order CT imaging as well as labs.   Sherrell Puller, [place] 04/10/22 1845

## 2022-04-10 NOTE — ED Triage Notes (Signed)
The pt is c/o abd pain  he had kidney surgery 2 months  and now he has a hernia ??? From that surgery  c/o more pain and swelling

## 2022-04-10 NOTE — Telephone Encounter (Signed)
Needs to be seen urgently at urgent care and also needs to call surgeon.

## 2022-04-11 ENCOUNTER — Emergency Department (HOSPITAL_COMMUNITY): Payer: Medicare HMO

## 2022-04-11 DIAGNOSIS — I700 Atherosclerosis of aorta: Secondary | ICD-10-CM | POA: Diagnosis not present

## 2022-04-11 DIAGNOSIS — K458 Other specified abdominal hernia without obstruction or gangrene: Secondary | ICD-10-CM | POA: Diagnosis not present

## 2022-04-11 MED ORDER — IOHEXOL 300 MG/ML  SOLN
100.0000 mL | Freq: Once | INTRAMUSCULAR | Status: AC | PRN
  Administered 2022-04-11: 100 mL via INTRAVENOUS

## 2022-04-11 NOTE — ED Provider Notes (Signed)
[facility] EMERGENCY DEPARTMENT Provider Note   CSN: [id number] Arrival date & time: 04/10/22  1716     History  Chief Complaint  Patient presents with   Abdominal Pain    Samuel Lawson is a 76 y.o. 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  The patient states that he has had a resection of a left renal mass in March of this year and that he had a small ventral hernia at that time that has been increasing in size recently with Valsalva maneuvers such as cough.  The patient has had no associated nausea, vomiting and has been passing flatus appropriately on a daily basis.  No fevers or chills  Abdominal Pain Associated symptoms: no chest pain, no chills, no cough, no dysuria, no fever, no hematuria, no shortness of breath, no sore throat and no vomiting        Home Medications Prior to Admission medications   Medication Sig Start Date End Date Taking? Authorizing Provider  aspirin EC 81 MG tablet Take 81 mg by mouth daily. Swallow whole.    [provider]  atorvastatin (LIPITOR) 40 MG tablet TAKE 1 TABLET (40 MG TOTAL) BY MOUTH AT BEDTIME. 03/07/22 03/07/23  Charlott Rakes, MD  Blood Glucose Calibration (ACCU-CHEK GUIDE CONTROL) LIQD 1 each by In Vitro route once as needed for up to 1 dose. R73.03 01/07/20   Gildardo Pounds, NP  carvedilol (COREG) 3.125 MG tablet Take 1 tablet (3.125 mg total) by mouth 2 (two) times daily with a meal. 03/07/22   Charlott Rakes, MD  Cholecalciferol (VITAMIN D3) 125 MCG (5000 UT) CAPS Take 10,000 Units by mouth daily.    [provider]  fluticasone (FLONASE) 50 MCG/ACT nasal spray Place 2 sprays into both nostrils daily. 03/07/22   Charlott Rakes, MD  glucose blood (ACCU-CHEK GUIDE) test strip Use as instructed. Check blood glucose by fingerstick once  per day. R73.03 10/23/21   Charlott Rakes, MD  Multiple Vitamins-Minerals (MULTIVITAMIN WITH MINERALS) tablet Take 1 tablet by mouth daily.    [provider]  omega-3 acid ethyl esters (LOVAZA) 1 g capsule TAKE 1 CAPSULE (1 G TOTAL) BY MOUTH 2 (TWO) TIMES DAILY. 03/26/22   Charlott Rakes, MD  tamsulosin (FLOMAX) 0.4 MG CAPS capsule take 2 capsules by mouth daily Patient taking differently: Take 0.4 mg by mouth daily. 09/18/21     promethazine (PHENERGAN) 25 MG tablet Take 1 tablet (25 mg total) by mouth every 6 (six) hours as needed for nausea. 06/18/12 12/07/12  Schinlever, Barnetta Chapel, PA-C      Allergies    Patient has no known allergies.    Review of Systems   Review of Systems  Constitutional:  Negative for chills and fever.  HENT:  Negative for ear pain and sore throat.   Eyes:  Negative for pain and visual disturbance.  Respiratory:  Negative for cough and shortness of breath.   Cardiovascular:  Negative for chest pain and palpitations.  Gastrointestinal:  Positive for abdominal pain. Negative for vomiting.  Genitourinary:  Negative for dysuria and hematuria.  Musculoskeletal:  Negative for arthralgias and back pain.  Skin:  Negative for color change and rash.  Neurological:  Negative for seizures and syncope.  All other systems reviewed and are negative.   Physical Exam Updated Vital Signs BP  103/60 (BP Location: Right Arm)   Pulse (!) 55   Temp 97.7 F (36.5 C)   Resp 16   Ht '5\' 5"'$  (1.651 m)   Wt 85.5 kg   SpO2 93%   BMI 31.37 kg/m  Physical Exam Vitals and nursing note reviewed.  Constitutional:      General: He is not in acute distress.    Appearance: He is well-developed.     Comments: Upon entering the exam room, the patient is sitting upright awake and alert no acute distress.  HENT:     Head: Normocephalic and atraumatic.     Mouth/Throat:     Comments: Moist mucous membranes Eyes:     Conjunctiva/sclera: Conjunctivae normal.  Cardiovascular:      Rate and Rhythm: Normal rate and regular rhythm.     Heart sounds: No murmur heard. Pulmonary:     Effort: Pulmonary effort is normal. No respiratory distress.     Breath sounds: Normal breath sounds.  Abdominal:     Palpations: Abdomen is soft.     Tenderness: There is no abdominal tenderness.     Comments: Soft, nondistended and nontender.  There is a ventral hernia that is appreciable with Valsalva but is reducible.  No inguinal hernias appreciated with Valsalva  Musculoskeletal:        General: No swelling.     Cervical back: Neck supple.  Skin:    General: Skin is warm and dry.     Capillary Refill: Capillary refill takes less than 2 seconds.  Neurological:     Mental Status: He is alert.     Comments: Fully alert and oriented moving all extremity spontaneously grossly neurologically intact  Psychiatric:        Mood and Affect: Mood normal.     ED Results / Procedures / Treatments   Labs (all labs ordered are listed, but only abnormal results are displayed) Labs Reviewed  CBC WITH DIFFERENTIAL/PLATELET - Abnormal; Notable for the following components:      Result Value   Platelets 120 (*)    All other components within normal limits  COMPREHENSIVE METABOLIC PANEL - Abnormal; Notable for the following components:   Glucose, Bld 112 (*)    All other components within normal limits  I-STAT CHEM 8, ED - Abnormal; Notable for the following components:   Glucose, Bld 108 (*)    All other components within normal limits  LIPASE, BLOOD  URINALYSIS, ROUTINE W REFLEX MICROSCOPIC    EKG None  Radiology CT ABDOMEN PELVIS W CONTRAST  Result Date: 04/11/2022 CLINICAL DATA:  Hernia, complicated. EXAM: CT ABDOMEN AND PELVIS WITH CONTRAST TECHNIQUE: Multidetector CT imaging of the abdomen and pelvis was performed using the standard protocol following bolus administration of intravenous contrast. RADIATION DOSE REDUCTION: This exam was performed according to the departmental  dose-optimization program which includes automated exposure control, adjustment of the mA and/or kV according to patient size and/or use of iterative reconstruction technique. CONTRAST:  181m OMNIPAQUE IOHEXOL 300 MG/ML  SOLN COMPARISON:  03/26/2022. FINDINGS: Lower chest: Coronary artery calcifications are noted. The lung bases are clear. Hepatobiliary: No focal liver abnormality. Hepatic steatosis is noted. No biliary ductal dilatation. A stone is present in the gallbladder. Pancreas: Unremarkable. No pancreatic ductal dilatation or surrounding inflammatory changes. Spleen: Normal in size without focal abnormality. Adrenals/Urinary Tract: No adrenal nodule or mass. The kidneys enhance symmetrically. Perinephric fat stranding is noted on the left. There are right renal cysts. There is a complex hyperdense lesion  in the lower pole of the left kidney measuring 1.5 cm, previously characterized as a benign hemorrhagic or proteinaceous cyst. Partial nephrectomy changes are noted in the mid left kidney. No renal calculus or hydronephrosis. The bladder is unremarkable. Stomach/Bowel: Stomach is within normal limits. Appendix appears normal. No evidence of bowel wall thickening, distention, or inflammatory changes. No free air or pneumatosis. There is colonic diverticulosis without diverticulitis. Vascular/Lymphatic: Aortic atherosclerosis. No enlarged abdominal or pelvic lymph nodes. Reproductive: Enlarged prostate gland. Other: There is a broad-based anterior abdominal wall hernia in the midline containing nonobstructed colon, not significantly changed from the prior exam. A fat containing umbilical hernia is noted. A fat containing inguinal hernias are noted bilaterally. No abdominopelvic ascites. Musculoskeletal: Degenerative changes are present in the thoracolumbar spine. No acute or suspicious osseous abnormality. IMPRESSION: 1. Broad-based anterior abdominal wall hernia containing nonobstructed colon. 2. Fat  containing umbilical hernia and bilateral inguinal hernias. 3. 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 4. Enlarged prostate gland. 5. Aortic atherosclerosis. Electronically Signed   By: Brett Fairy M.D.   On: 04/11/2022 02:37    Procedures Procedures    Medications Ordered in ED Medications  iohexol (OMNIPAQUE) 300 MG/ML solution 100 mL (100 mLs Intravenous Contrast Given 04/11/22 0220)    ED Course/ Medical Decision Making/ A&P                           Medical Decision Making Amount and/or Complexity of Data Reviewed Labs:  Decision-making details documented in ED Course. Radiology: ordered and independent interpretation performed. Decision-making details documented in ED Course.   Patient presents to the emergency department hemodynamically stable, afebrile and with a reducible abdominal mass that has been associated with coughing with reducible hernia on exam.  Patient has had a CT scan via first look pathway which I have personally reviewed and interpreted and reveals anterior abdominal wall hernia with nonobstructed colon.  Bilateral inguinal hernias appreciated on CT.  No stranding, obstruction or other acute findings in the abdomen or pelvis.  I have further reviewed and interpreted the patient's laboratory work-up which was grossly unremarkable.  Creatinine is at baseline for patient.  Given reassuring exam, benign imaging and laboratory work-up will discharge patient with general surgery referral as above.  Was discharged with strict return precautions for signs of small bowel obstruction explained at bedside including persistent nausea vomiting, suddenly worsening pain or nonreducible hernia.        Final Clinical Impression(s) / ED Diagnoses Final diagnoses:  Other specified abdominal hernia without obstruction or gangrene    Rx / DC Orders ED Discharge Orders     None         Levie Heritage, MD 04/11/22 0900     Isla Pence, MD 04/11/22 [phone]

## 2022-04-11 NOTE — Discharge Instructions (Addendum)
You were seen in the emergency room today for a hernia that has gotten larger over the past several weeks.  We did a CT scan that did not show anything emergently wrong.  You do have a hernia.  You need to make an appointment with the surgeon with phone number above.  If you have suddenly worsening abdominal pain, or you are unable to keep anything down by mouth please come back to the emergency department.

## 2022-04-20 ENCOUNTER — Encounter (HOSPITAL_COMMUNITY): Payer: Self-pay | Admitting: *Deleted

## 2022-04-20 ENCOUNTER — Emergency Department (HOSPITAL_COMMUNITY)
Admission: EM | Admit: 2022-04-20 | Discharge: 2022-04-21 | Disposition: A | Discharge status: Home / Self Care | Payer: Medicare HMO | Attending: Emergency Medicine | Admitting: Emergency Medicine

## 2022-04-20 ENCOUNTER — Other Ambulatory Visit: Payer: Self-pay

## 2022-04-20 DIAGNOSIS — R1110 Vomiting, unspecified: Secondary | ICD-10-CM

## 2022-04-20 DIAGNOSIS — Z79899 Other long term (current) drug therapy: Secondary | ICD-10-CM | POA: Insufficient documentation

## 2022-04-20 DIAGNOSIS — R112 Nausea with vomiting, unspecified: Secondary | ICD-10-CM | POA: Diagnosis not present

## 2022-04-20 DIAGNOSIS — Z7982 Long term (current) use of aspirin: Secondary | ICD-10-CM | POA: Insufficient documentation

## 2022-04-20 DIAGNOSIS — K8020 Calculus of gallbladder without cholecystitis without obstruction: Secondary | ICD-10-CM | POA: Diagnosis not present

## 2022-04-20 DIAGNOSIS — N281 Cyst of kidney, acquired: Secondary | ICD-10-CM | POA: Diagnosis not present

## 2022-04-20 DIAGNOSIS — K5730 Diverticulosis of large intestine without perforation or abscess without bleeding: Secondary | ICD-10-CM | POA: Diagnosis not present

## 2022-04-20 DIAGNOSIS — R1084 Generalized abdominal pain: Secondary | ICD-10-CM | POA: Insufficient documentation

## 2022-04-20 LAB — COMPREHENSIVE METABOLIC PANEL
ALT: 13 U/L (ref 0–44)
AST: 21 U/L (ref 15–41)
Albumin: 4.1 g/dL (ref 3.5–5.0)
Alkaline Phosphatase: 58 U/L (ref 38–126)
Anion gap: 11 (ref 5–15)
BUN: 17 mg/dL (ref 8–23)
CO2: 24 mmol/L (ref 22–32)
Calcium: 9.5 mg/dL (ref 8.9–10.3)
Chloride: 105 mmol/L (ref 98–111)
Creatinine, Ser: 1.24 mg/dL (ref 0.61–1.24)
GFR, Estimated: >60 mL/min (ref >60)
Glucose, Bld: 148 mg/dL — ABNORMAL HIGH (ref 70–99)
Potassium: 3.6 mmol/L (ref 3.5–5.1)
Sodium: 140 mmol/L (ref 135–145)
Total Bilirubin: 1 mg/dL (ref 0.3–1.2)
Total Protein: 7 g/dL (ref 6.5–8.1)

## 2022-04-20 LAB — URINALYSIS, ROUTINE W REFLEX MICROSCOPIC
Bilirubin Urine: NEGATIVE
Glucose, UA: NEGATIVE mg/dL
Hgb urine dipstick: NEGATIVE
Ketones, ur: NEGATIVE mg/dL
Leukocytes,Ua: NEGATIVE
Nitrite: NEGATIVE
Protein, ur: NEGATIVE mg/dL
Specific Gravity, Urine: 1.006 (ref 1.005–1.030)
pH: 5 (ref 5.0–8.0)

## 2022-04-20 LAB — CBC WITH DIFFERENTIAL/PLATELET
Abs Immature Granulocytes: 0.03 10*3/uL (ref 0.00–0.07)
Basophils Absolute: 0 10*3/uL (ref 0.0–0.1)
Basophils Relative: 1 %
Eosinophils Absolute: 0.1 10*3/uL (ref 0.0–0.5)
Eosinophils Relative: 2 %
HCT: 46.9 % (ref 39.0–52.0)
Hemoglobin: 15.6 g/dL (ref 13.0–17.0)
Immature Granulocytes: 0 %
Lymphocytes Relative: 19 %
Lymphs Abs: 1.7 10*3/uL (ref 0.7–4.0)
MCH: 30.9 pg (ref 26.0–34.0)
MCHC: 33.3 g/dL (ref 30.0–36.0)
MCV: 92.9 fL (ref 80.0–100.0)
Monocytes Absolute: 0.8 10*3/uL (ref 0.1–1.0)
Monocytes Relative: 9 %
Neutro Abs: 6.2 10*3/uL (ref 1.7–7.7)
Neutrophils Relative %: 69 %
Platelets: 130 10*3/uL — ABNORMAL LOW (ref 150–400)
RBC: 5.05 MIL/uL (ref 4.22–5.81)
RDW: 12.9 % (ref 11.5–15.5)
WBC: 8.8 10*3/uL (ref 4.0–10.5)
nRBC: 0 % (ref 0.0–0.2)

## 2022-04-20 LAB — LIPASE, BLOOD: Lipase: 30 U/L (ref 11–51)

## 2022-04-20 NOTE — ED Triage Notes (Signed)
Pt states hx of hernia, reports it is getting larger, and it is very painful, says his upper abdomen is swelling and pushing up into his chest.  Due to have surgery evaluation for the same in September. Denies n/v/d.

## 2022-04-20 NOTE — ED Provider Triage Note (Signed)
  Emergency Medicine Provider Triage Evaluation Note  MRN:  [id number]  Arrival date & time: 04/20/22    Medically screening exam initiated at 10:42 PM.   CC:   Abdominal Pain   HPI:  Samuel Lawson is a 76 y.o. year-old male presents to the ED with chief complaint of abdominal pain.  Hx of abdominal wall hernia.  States that size and severity is worsening.  Denies vomiting or diarrhea.  Denies fever.    History provided by patient. ROS:  -As included in HPI PE:   Vitals:   04/20/22 2239  BP: 119/79  Pulse: 75  Resp: (!) 22  Temp: 98.1 F (36.7 C)  SpO2: 95%    Non-toxic appearing No respiratory distress Large abdominal wall hernia, non-tender, no erythema MDM:   I've ordered labs and imaging in triage to expedite lab/diagnostic workup.  Patient was informed that the remainder of the evaluation will be completed by another provider, this initial triage assessment does not replace that evaluation, and the importance of remaining in the ED until their evaluation is complete.    Montine Circle, PA-C 04/20/22 2244

## 2022-04-21 ENCOUNTER — Emergency Department (HOSPITAL_COMMUNITY): Payer: Medicare HMO

## 2022-04-21 DIAGNOSIS — K8020 Calculus of gallbladder without cholecystitis without obstruction: Secondary | ICD-10-CM | POA: Diagnosis not present

## 2022-04-21 DIAGNOSIS — R1084 Generalized abdominal pain: Secondary | ICD-10-CM | POA: Diagnosis not present

## 2022-04-21 DIAGNOSIS — K5730 Diverticulosis of large intestine without perforation or abscess without bleeding: Secondary | ICD-10-CM | POA: Diagnosis not present

## 2022-04-21 DIAGNOSIS — N281 Cyst of kidney, acquired: Secondary | ICD-10-CM | POA: Diagnosis not present

## 2022-04-21 MED ORDER — DOCUSATE SODIUM 100 MG PO CAPS: 100.0000 mg | ORAL_CAPSULE | Freq: Two times a day (BID) | ORAL | 0 refills | Status: DC

## 2022-04-21 MED ORDER — IOHEXOL 300 MG/ML  SOLN
100.0000 mL | Freq: Once | INTRAMUSCULAR | Status: AC | PRN
  Administered 2022-04-21: 100 mL via INTRAVENOUS

## 2022-04-21 NOTE — ED Provider Notes (Signed)
[facility] EMERGENCY DEPARTMENT Provider Note   CSN: [id number] Arrival date & time: 04/20/22  2139     History  Chief Complaint  Patient presents with   Abdominal Pain    Samuel Lawson is a 76 y.o. male.  76 year old male the presents the ER today for evaluation of abdominal pain.  Patient has a known ventral hernia he feels like it got larger and more painful is affecting his appetite.  He already has an appointment with surgery.  He had a CT scan about 8 days ago that showed cholelithiasis and this hernia also an umbilical hernia.  Has a history of a an abdominal surgery repair in the past.   Abdominal Pain      Home Medications Prior to Admission medications   Medication Sig Start Date End Date Taking? Authorizing Provider  docusate sodium (COLACE) 100 MG capsule Take 1 capsule (100 mg total) by mouth every 12 (twelve) hours. 04/21/22  Yes Reisha Wos, Corene Cornea, MD  aspirin EC 81 MG tablet Take 81 mg by mouth daily. Swallow whole.    [provider]  atorvastatin (LIPITOR) 40 MG tablet TAKE 1 TABLET (40 MG TOTAL) BY MOUTH AT BEDTIME. 03/07/22 03/07/23  Charlott Rakes, MD  Blood Glucose Calibration (ACCU-CHEK GUIDE CONTROL) LIQD 1 each by In Vitro route once as needed for up to 1 dose. R73.03 01/07/20   Gildardo Pounds, NP  carvedilol (COREG) 3.125 MG tablet Take 1 tablet (3.125 mg total) by mouth 2 (two) times daily with a meal. 03/07/22   Charlott Rakes, MD  Cholecalciferol (VITAMIN D3) 125 MCG (5000 UT) CAPS Take 10,000 Units by mouth daily.    [provider]  fluticasone (FLONASE) 50 MCG/ACT nasal spray Place 2 sprays into both nostrils daily. 03/07/22   Charlott Rakes, MD  glucose blood (ACCU-CHEK GUIDE) test strip Use as instructed. Check blood glucose by fingerstick once per day. R73.03 10/23/21   Charlott Rakes, MD  Multiple Vitamins-Minerals (MULTIVITAMIN WITH MINERALS) tablet Take 1 tablet by mouth daily.    [provider]   omega-3 acid ethyl esters (LOVAZA) 1 g capsule TAKE 1 CAPSULE (1 G TOTAL) BY MOUTH 2 (TWO) TIMES DAILY. 03/26/22   Charlott Rakes, MD  tamsulosin (FLOMAX) 0.4 MG CAPS capsule take 2 capsules by mouth daily Patient taking differently: Take 0.4 mg by mouth daily. 09/18/21     promethazine (PHENERGAN) 25 MG tablet Take 1 tablet (25 mg total) by mouth every 6 (six) hours as needed for nausea. 06/18/12 12/07/12  Schinlever, Barnetta Chapel, PA-C      Allergies    Patient has no known allergies.    Review of Systems   Review of Systems  Gastrointestinal:  Positive for abdominal pain.    Physical Exam Updated Vital Signs BP 107/67   Pulse 75   Temp 98.3 F (36.8 C)   Resp 16   SpO2 93%  Physical Exam Vitals and nursing note reviewed.  Constitutional:      Appearance: He is well-developed.  HENT:     Head: Normocephalic and atraumatic.  Cardiovascular:     Rate and Rhythm: Normal rate.  Pulmonary:     Effort: Pulmonary effort is normal. No respiratory distress.  Abdominal:     General: There is no distension.     Palpations: Abdomen is soft.     Tenderness: There is no abdominal tenderness.     Hernia: A hernia is present. Hernia is present in the ventral area.  Musculoskeletal:  General: Normal range of motion.     Cervical back: Normal range of motion.  Skin:    General: Skin is warm and dry.  Neurological:     Mental Status: He is alert.     ED Results / Procedures / Treatments   Labs (all labs ordered are listed, but only abnormal results are displayed) Labs Reviewed  COMPREHENSIVE METABOLIC PANEL - Abnormal; Notable for the following components:      Result Value   Glucose, Bld 148 (*)    All other components within normal limits  CBC WITH DIFFERENTIAL/PLATELET - Abnormal; Notable for the following components:   Platelets 130 (*)    All other components within normal limits  LIPASE, BLOOD  URINALYSIS, ROUTINE W REFLEX MICROSCOPIC     EKG None  Radiology CT ABDOMEN PELVIS W CONTRAST  Result Date: 04/21/2022 CLINICAL DATA:  Acute abdominal pain EXAM: CT ABDOMEN AND PELVIS WITH CONTRAST TECHNIQUE: Multidetector CT imaging of the abdomen and pelvis was performed using the standard protocol following bolus administration of intravenous contrast. RADIATION DOSE REDUCTION: This exam was performed according to the departmental dose-optimization program which includes automated exposure control, adjustment of the mA and/or kV according to patient size and/or use of iterative reconstruction technique. CONTRAST:  174m OMNIPAQUE IOHEXOL 300 MG/ML  SOLN COMPARISON:  04/11/2022 FINDINGS: Lower chest: No acute abnormality. Hepatobiliary: Gallstone is noted in the region of the gallbladder neck. No complicating factors are seen. The liver is within normal limits. Pancreas: Unremarkable. No pancreatic ductal dilatation or surrounding inflammatory changes. Spleen: Normal in size without focal abnormality. Adrenals/Urinary Tract: Adrenal glands are within normal limits. Right renal cyst is noted and stable. No follow-up examination is recommended. Postsurgical changes are noted in the left kidney stable in appearance from the prior exam. Stable cyst in the lower pole of the left kidney is noted. Again no follow-up is recommended. No obstructive changes are seen. The bladder is well distended. Stomach/Bowel: Scattered diverticular change of the colon is noted. The loop of transverse colon extends into and anterior abdominal wall hernia although no obstructive changes are seen. The appendix is within normal limits. Small bowel and stomach are otherwise within normal limits. Vascular/Lymphatic: Aortic atherosclerosis. No enlarged abdominal or pelvic lymph nodes. Reproductive: Prostate is unremarkable. Other: No abdominal wall hernia or abnormality. No abdominopelvic ascites. Musculoskeletal: No acute or significant osseous findings. IMPRESSION:  Cholelithiasis without complicating factors. Postsurgical changes in the left kidney. Stable cystic lesions from the prior exam. No follow-up is recommended. Diverticulosis without diverticulitis. Electronically Signed   By: MInez CatalinaM.D.   On: 04/21/2022 00:42    Procedures Procedures    Medications Ordered in ED Medications  iohexol (OMNIPAQUE) 300 MG/ML solution 100 mL (100 mLs Intravenous Contrast Given 04/21/22 0034)    ED Course/ Medical Decision Making/ A&P                           Medical Decision Making Risk OTC drugs.   CT scan shows persistent cholelithiasis but no evidence of cholecystitis and his labs are within normal limits.  His hernia is somewhat large but does not appear to be that much larger than it was on the previous CT scan if at all.  [place] reducible.  No strangulation or obstruction associated with it.  No evidence of incarceration.  Told him to continue his high-fiber diet and take some stool softeners and follow-up with surgery as planned return here if things  are worsening (redness, hardness, severe pain, throwing up, loss of bowel movements).  Final Clinical Impression(s) / ED Diagnoses Final diagnoses:  Vomiting, unspecified vomiting type, unspecified whether nausea present  Generalized abdominal pain    Rx / DC Orders ED Discharge Orders          Ordered    docusate sodium (COLACE) 100 MG capsule  Every 12 hours        04/21/22 0529              Bradan Congrove, Corene Cornea, MD 04/21/22 [phone]

## 2022-04-24 ENCOUNTER — Other Ambulatory Visit: Payer: Self-pay

## 2022-04-26 ENCOUNTER — Other Ambulatory Visit: Payer: Self-pay | Admitting: Surgery

## 2022-04-26 DIAGNOSIS — K432 Incisional hernia without obstruction or gangrene: Secondary | ICD-10-CM | POA: Diagnosis not present

## 2022-04-29 ENCOUNTER — Encounter (HOSPITAL_COMMUNITY): Payer: Self-pay

## 2022-04-29 ENCOUNTER — Other Ambulatory Visit: Payer: Self-pay

## 2022-04-29 ENCOUNTER — Encounter (HOSPITAL_COMMUNITY)
Admission: RE | Admit: 2022-04-29 | Discharge: 2022-04-29 | Disposition: A | Discharge status: Home / Self Care | Payer: Medicare HMO | Source: Ambulatory Visit | Attending: Surgery | Admitting: Surgery

## 2022-04-29 VITALS — BP 129/74 | HR 57 | Temp 98.0°F | Resp 16 | Ht 67.0 in

## 2022-04-29 DIAGNOSIS — Z01812 Encounter for preprocedural laboratory examination: Secondary | ICD-10-CM | POA: Diagnosis not present

## 2022-04-29 DIAGNOSIS — R7303 Prediabetes: Secondary | ICD-10-CM | POA: Insufficient documentation

## 2022-04-29 DIAGNOSIS — Z01818 Encounter for other preprocedural examination: Secondary | ICD-10-CM

## 2022-04-29 LAB — HEMOGLOBIN A1C
Hgb A1c MFr Bld: 5.7 % — ABNORMAL HIGH (ref 4.8–5.6)
Mean Plasma Glucose: 116.89 mg/dL

## 2022-04-29 LAB — GLUCOSE, CAPILLARY: Glucose-Capillary: 111 mg/dL — ABNORMAL HIGH (ref 70–99)

## 2022-04-29 NOTE — Patient Instructions (Addendum)
SURGICAL WAITING ROOM VISITATION Patients having surgery or a procedure may have no more than 2 support people in the waiting area - these visitors may rotate.   Children under the age of 71 must have an adult with them who is not the patient. If the patient needs to stay at the hospital during part of their recovery, the visitor guidelines for inpatient rooms apply. Pre-op nurse will coordinate an appropriate time for 1 support person to accompany patient in pre-op.  This support person may not rotate.    Please refer to the [facility] website for the visitor guidelines for Inpatients (after your surgery is over and you are in a regular room).       Your procedure is scheduled on:  05/02/22    Report to [facility] Main Entrance    Report to admitting at   [phone]   Call this number if you have problems the morning of surgery [phone]   Do not eat food :After Midnight.   After Midnight you may have the following liquids until __ 0445____ AM  DAY OF SURGERY  Water Non-Citrus Juices (without pulp, NO RED) Carbonated Beverages Black Coffee (NO MILK/CREAM OR CREAMERS, sugar ok)  Clear Tea (NO MILK/CREAM OR CREAMERS, sugar ok) regular and decaf                             Plain Jell-O (NO RED)                                           Fruit ices (not with fruit pulp, NO RED)                                     Popsicles (NO RED)                                                               Sports drinks like Gatorade (NO RED)                      The day of surgery:  Drink ONE (1) Pre-Surgery Clear Ensure or G2 at    0445 AM ( have completed by )  the morning of surgery. Drink in one sitting. Do not sip.  This drink was given to you during your hospital  pre-op appointment visit. Nothing else to drink after completing the  Pre-Surgery Clear Ensure or G2.          If you have questions, please contact your surgeon's office.   FOLLOW BOWEL PREP AND ANY ADDITIONAL  PRE OP INSTRUCTIONS YOU RECEIVED FROM YOUR SURGEON'S OFFICE!!!     Oral Hygiene is also important to reduce your risk of infection.                                    Remember - BRUSH YOUR TEETH THE MORNING OF SURGERY WITH YOUR REGULAR TOOTHPASTE   Do NOT smoke after Midnight  Take these medicines the morning of surgery with A SIP OF WATER:  coreg, flonase, flomax   DO NOT TAKE ANY ORAL DIABETIC MEDICATIONS DAY OF YOUR SURGERY  Bring CPAP mask and tubing day of surgery.                              You may not have any metal on your body including hair pins, jewelry, and body piercing             Do not wear make-up, lotions, powders, perfumes/cologne, or deodorant  Do not wear nail polish including gel and S&S, artificial/acrylic nails, or any other type of covering on natural nails including finger and toenails. If you have artificial nails, gel coating, etc. that needs to be removed by a nail salon please have this removed prior to surgery or surgery may need to be canceled/ delayed if the surgeon/ anesthesia feels like they are unable to be safely monitored.   Do not shave  48 hours prior to surgery.               Men may shave face and neck.   Do not bring valuables to the hospital. [place].   Contacts, dentures or bridgework may not be worn into surgery.   Bring small overnight bag day of surgery.   DO NOT [place]. PHARMACY WILL DISPENSE MEDICATIONS LISTED ON YOUR MEDICATION LIST TO YOU DURING YOUR ADMISSION [place]!    Patients discharged on the day of surgery will not be allowed to drive home.  Someone NEEDS to stay with you for the first 24 hours after anesthesia.   Special Instructions: Bring a copy of your healthcare power of attorney and living will documents         the day of surgery if you haven't scanned them before.              Please read over the following fact  sheets you were given: IF YOU HAVE QUESTIONS ABOUT YOUR PRE-OP INSTRUCTIONS PLEASE CALL [phone]     [facility] - Preparing for Surgery Before surgery, you can play an important role.  Because skin is not sterile, your skin needs to be as free of germs as possible.  You can reduce the number of germs on your skin by washing with CHG (chlorahexidine gluconate) soap before surgery.  CHG is an antiseptic cleaner which kills germs and bonds with the skin to continue killing germs even after washing. Please DO NOT use if you have an allergy to CHG or antibacterial soaps.  If your skin becomes reddened/irritated stop using the CHG and inform your nurse when you arrive at Short Stay. Do not shave (including legs and underarms) for at least 48 hours prior to the first CHG shower.  You may shave your face/neck. Please follow these instructions carefully:  1.  Shower with CHG Soap the night before surgery and the  morning of Surgery.  2.  If you choose to wash your hair, wash your hair first as usual with your  normal  shampoo.  3.  After you shampoo, rinse your hair and body thoroughly to remove the  shampoo.  4.  Use CHG as you would any other liquid soap.  You can apply chg directly  to the skin and wash                       Gently with a scrungie or clean washcloth.  5.  Apply the CHG Soap to your body ONLY FROM THE NECK DOWN.   Do not use on face/ open                           Wound or open sores. Avoid contact with eyes, ears mouth and genitals (private parts).                       Wash face,  Genitals (private parts) with your normal soap.             6.  Wash thoroughly, paying special attention to the area where your surgery  will be performed.  7.  Thoroughly rinse your body with warm water from the neck down.  8.  DO NOT shower/wash with your normal soap after using and rinsing off  the CHG Soap.                9.  Pat yourself dry with a clean towel.             10.  Wear clean pajamas.            11.  Place clean sheets on your bed the night of your first shower and do not  sleep with pets. Day of Surgery : Do not apply any lotions/deodorants the morning of surgery.  Please wear clean clothes to the hospital/surgery center.  FAILURE TO FOLLOW THESE INSTRUCTIONS MAY RESULT IN THE CANCELLATION OF YOUR SURGERY PATIENT SIGNATURE_________________________________  NURSE SIGNATURE__________________________________  ________________________________________________________________________

## 2022-04-29 NOTE — Progress Notes (Addendum)
Anesthesia Review:  PCP: Geryl Rankins, NP  LOV 03/07/22- Charlane Ferretti [place]  Cardiologist : DR Buford Dresser LOV 07/06/20  Hx of stent  Chest x-ray : 7/10/'23- 2view  EKG :12/07/21  Echo : Stress test: Cardiac Cath :  Activity level: can do a flight of stairs without difficutly  Sleep Study/ CPAP : none  Fasting Blood Sugar :      / Checks Blood Sugar -- times a day:   Blood Thinner/ Instructions /Last Dose: ASA / Instructions/ Last Dose :   04/20/22- In ED for vomoiiting  04/20/22- CBc/Diff and CMP in epic   81 mg aspirin  Smoker  Pharmacy in to reconcile meds.  Prediabetes- Checks glucose once daily at home  Hgba1c- 04/29/22-5.7

## 2022-05-01 ENCOUNTER — Encounter: Payer: Medicare HMO | Admitting: Gastroenterology

## 2022-05-01 NOTE — H&P (Signed)
REFERRING PHYSICIAN: [facility]  PROVIDER: Beverlee Nims, MD  MRN: [id number] DOB: 06-Aug-1946 DATE OF ENCOUNTER: 04/26/2022 Subjective   Chief Complaint: New Consultation (Ventral Hernia)   History of Present Illness: Samuel Lawson is a 76 y.o. male who is seen today as an office consultation for evaluation of New Consultation (Ventral Hernia) .   This is a 76 year old gentleman who is referred here for evaluation of a symptomatic incisional hernia. 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 He has since developed an incisional hernia at the robotic port site. He has had intermittent severe discomfort at the hernia site requiring 2 separate visits to the emergency department. He underwent a CT scan showing the hernia containing transverse colon which was nonobstructed. He reports it will cause pain and distention when he eats. He is still moving his bowels. He is comfortable today. He is otherwise without complaints. The pain at times can be moderate to severe.  Review of Systems: A complete review of systems was obtained from the patient. I have reviewed this information and discussed as appropriate with the patient. See HPI as well for other ROS.  ROS   Medical History: Past Medical History:  Diagnosis Date  Hyperlipidemia   There is no problem list on file for this patient.  History reviewed. No pertinent surgical history.   No Known Allergies  Current Outpatient Medications on File Prior to Visit  Medication Sig Dispense Refill  atorvastatin (LIPITOR) 40 MG tablet Take 40 mg by mouth at bedtime  carvediloL (COREG) 3.125 MG tablet Take by mouth  omega-3 acid ethyl esters (LOVAZA) 1 gram capsule Take 1 g by mouth 2 (two) times daily  tamsulosin (FLOMAX) 0.4 mg capsule Take 2 capsules by mouth once daily  aspirin 81 MG EC tablet Take by mouth   No current facility-administered medications on file  prior to visit.   History reviewed. No pertinent family history.   Social History   Tobacco Use  Smoking Status Never  Smokeless Tobacco Never    Social History   Socioeconomic History  Marital status: Divorced  Tobacco Use  Smoking status: Never  Smokeless tobacco: Never  Vaping Use  Vaping Use: Never used  Substance and Sexual Activity  Alcohol use: Not Currently  Drug use: Never   Objective:   Vitals:  04/26/22 0923  BP: 122/84  Pulse: 80  SpO2: 92%  Weight: 85.5 kg (188 lb 6.4 oz)  Height: 170.2 cm ('5\' 7"'$ )   Body mass index is 29.51 kg/m.  Physical Exam   On exam, He appears comfortable today.  His abdomen is soft and obese. He has a large hernia above the umbilicus at the robotic port site. It is difficult to reduce but reducible and tender. The fascial defect is approximately 6 cm in size.  Labs, Imaging and Diagnostic Testing: I reviewed his most recent CAT scan of the abdomen and pelvis  Assessment and Plan:   Diagnoses and all orders for this visit:  Incisional hernia, without obstruction or gangrene    At this point I had a long discussion with the patient regarding abdominal wall anatomy and hernias. We discussed the reasons for hernia repair. He is symptomatic from this hernia and at risk of incarceration or strangulation of the bowel. Again, he is required at least 2 trips to the emergency department for this. Urgent repair of the hernia is recommended. I discussed hernia repair with him in detail. I discussed  both the laparoscopic and open techniques. We have decided proceed with a laparoscopic incisional hernia repair with mesh. We discussed the risks which includes but is not limited to bleeding, infection, injury to surrounding structures, the need to convert to an open procedure, use of mesh, cardiopulmonary issues postoperative, postoperative recovery, etc. He understands and surgery will be scheduled urgently

## 2022-05-02 ENCOUNTER — Inpatient Hospital Stay (HOSPITAL_COMMUNITY)
Admission: RE | Admit: 2022-05-02 | Discharge: 2022-05-06 | DRG: 355 | Disposition: A | Discharge status: Home / Self Care | Payer: Medicare HMO | Attending: Surgery | Admitting: Surgery

## 2022-05-02 ENCOUNTER — Other Ambulatory Visit: Payer: Self-pay

## 2022-05-02 ENCOUNTER — Ambulatory Visit (HOSPITAL_COMMUNITY): Payer: Medicare HMO | Admitting: Physician Assistant

## 2022-05-02 ENCOUNTER — Encounter (HOSPITAL_COMMUNITY): Payer: Self-pay | Admitting: Surgery

## 2022-05-02 ENCOUNTER — Ambulatory Visit (HOSPITAL_COMMUNITY): Payer: Medicare HMO | Admitting: Certified Registered"

## 2022-05-02 ENCOUNTER — Encounter (HOSPITAL_COMMUNITY)
Admission: RE | Disposition: A | Discharge status: Home / Self Care | Payer: Self-pay | Source: Home / Self Care | Attending: Surgery

## 2022-05-02 DIAGNOSIS — R42 Dizziness and giddiness: Secondary | ICD-10-CM | POA: Diagnosis present

## 2022-05-02 DIAGNOSIS — Z01818 Encounter for other preprocedural examination: Principal | ICD-10-CM

## 2022-05-02 DIAGNOSIS — K219 Gastro-esophageal reflux disease without esophagitis: Secondary | ICD-10-CM | POA: Diagnosis present

## 2022-05-02 DIAGNOSIS — E785 Hyperlipidemia, unspecified: Secondary | ICD-10-CM | POA: Diagnosis present

## 2022-05-02 DIAGNOSIS — K660 Peritoneal adhesions (postprocedural) (postinfection): Secondary | ICD-10-CM | POA: Diagnosis present

## 2022-05-02 DIAGNOSIS — I25118 Atherosclerotic heart disease of native coronary artery with other forms of angina pectoris: Secondary | ICD-10-CM | POA: Diagnosis not present

## 2022-05-02 DIAGNOSIS — M359 Systemic involvement of connective tissue, unspecified: Secondary | ICD-10-CM

## 2022-05-02 DIAGNOSIS — E669 Obesity, unspecified: Secondary | ICD-10-CM | POA: Diagnosis present

## 2022-05-02 DIAGNOSIS — K432 Incisional hernia without obstruction or gangrene: Secondary | ICD-10-CM

## 2022-05-02 DIAGNOSIS — I2510 Atherosclerotic heart disease of native coronary artery without angina pectoris: Secondary | ICD-10-CM | POA: Diagnosis present

## 2022-05-02 DIAGNOSIS — Z79899 Other long term (current) drug therapy: Secondary | ICD-10-CM

## 2022-05-02 DIAGNOSIS — J90 Pleural effusion, not elsewhere classified: Secondary | ICD-10-CM | POA: Diagnosis not present

## 2022-05-02 DIAGNOSIS — I10 Essential (primary) hypertension: Secondary | ICD-10-CM

## 2022-05-02 DIAGNOSIS — C642 Malignant neoplasm of left kidney, except renal pelvis: Secondary | ICD-10-CM | POA: Diagnosis present

## 2022-05-02 DIAGNOSIS — F17210 Nicotine dependence, cigarettes, uncomplicated: Secondary | ICD-10-CM | POA: Diagnosis not present

## 2022-05-02 DIAGNOSIS — Z7982 Long term (current) use of aspirin: Secondary | ICD-10-CM

## 2022-05-02 DIAGNOSIS — R058 Other specified cough: Secondary | ICD-10-CM | POA: Diagnosis not present

## 2022-05-02 DIAGNOSIS — Z85528 Personal history of other malignant neoplasm of kidney: Secondary | ICD-10-CM | POA: Diagnosis not present

## 2022-05-02 DIAGNOSIS — F419 Anxiety disorder, unspecified: Secondary | ICD-10-CM | POA: Diagnosis present

## 2022-05-02 DIAGNOSIS — R0602 Shortness of breath: Secondary | ICD-10-CM | POA: Diagnosis not present

## 2022-05-02 DIAGNOSIS — R7303 Prediabetes: Secondary | ICD-10-CM

## 2022-05-02 DIAGNOSIS — F411 Generalized anxiety disorder: Secondary | ICD-10-CM | POA: Diagnosis not present

## 2022-05-02 DIAGNOSIS — Z905 Acquired absence of kidney: Secondary | ICD-10-CM

## 2022-05-02 DIAGNOSIS — F17200 Nicotine dependence, unspecified, uncomplicated: Secondary | ICD-10-CM | POA: Diagnosis not present

## 2022-05-02 HISTORY — PX: INCISIONAL HERNIA REPAIR: SHX193

## 2022-05-02 SURGERY — REPAIR, HERNIA, INCISIONAL, LAPAROSCOPIC
Anesthesia: General | Site: Abdomen

## 2022-05-02 MED ORDER — TRAMADOL HCL 50 MG PO TABS: 50.0000 mg | ORAL_TABLET | Freq: Four times a day (QID) | ORAL | Status: DC | PRN

## 2022-05-02 MED ORDER — ONDANSETRON HCL 4 MG/2ML IJ SOLN
INTRAMUSCULAR | Status: AC
Start: 2022-05-02 — End: ?
  Filled 2022-05-02: qty 2

## 2022-05-02 MED ORDER — DEXAMETHASONE SODIUM PHOSPHATE 10 MG/ML IJ SOLN
INTRAMUSCULAR | Status: DC | PRN
  Administered 2022-05-02: 10 mg via INTRAVENOUS

## 2022-05-02 MED ORDER — FENTANYL CITRATE (PF) 100 MCG/2ML IJ SOLN
INTRAMUSCULAR | Status: AC
  Filled 2022-05-02: qty 2

## 2022-05-02 MED ORDER — CHLORHEXIDINE GLUCONATE 0.12 % MT SOLN
15.0000 mL | Freq: Once | OROMUCOSAL | Status: AC
  Administered 2022-05-02: 15 mL via OROMUCOSAL

## 2022-05-02 MED ORDER — BUPIVACAINE-EPINEPHRINE (PF) 0.5% -1:200000 IJ SOLN
INTRAMUSCULAR | Status: DC | PRN
  Administered 2022-05-02: 20 mL

## 2022-05-02 MED ORDER — ACETAMINOPHEN 10 MG/ML IV SOLN: 1000.0000 mg | Freq: Once | INTRAVENOUS | Status: DC | PRN

## 2022-05-02 MED ORDER — PROPOFOL 10 MG/ML IV BOLUS
INTRAVENOUS | Status: AC
  Filled 2022-05-02: qty 20

## 2022-05-02 MED ORDER — ONDANSETRON HCL 4 MG/2ML IJ SOLN
INTRAMUSCULAR | Status: DC | PRN
  Administered 2022-05-02: 4 mg via INTRAVENOUS

## 2022-05-02 MED ORDER — OXYCODONE HCL 5 MG PO TABS
ORAL_TABLET | ORAL | Status: AC
  Filled 2022-05-02: qty 1

## 2022-05-02 MED ORDER — CHLORHEXIDINE GLUCONATE CLOTH 2 % EX PADS: 6.0000 | MEDICATED_PAD | Freq: Once | CUTANEOUS | Status: DC

## 2022-05-02 MED ORDER — BUPIVACAINE-EPINEPHRINE (PF) 0.5% -1:200000 IJ SOLN
INTRAMUSCULAR | Status: AC
  Filled 2022-05-02: qty 30

## 2022-05-02 MED ORDER — CARVEDILOL 3.125 MG PO TABS
3.1250 mg | ORAL_TABLET | Freq: Two times a day (BID) | ORAL | Status: DC
  Administered 2022-05-02 – 2022-05-06 (×8): 3.125 mg via ORAL
  Filled 2022-05-02 (×8): qty 1

## 2022-05-02 MED ORDER — DEXAMETHASONE SODIUM PHOSPHATE 10 MG/ML IJ SOLN
INTRAMUSCULAR | Status: AC
Start: 2022-05-02 — End: ?
  Filled 2022-05-02: qty 1

## 2022-05-02 MED ORDER — GLYCOPYRROLATE 0.2 MG/ML IJ SOLN
INTRAMUSCULAR | Status: DC | PRN
  Administered 2022-05-02: .2 mg via INTRAVENOUS

## 2022-05-02 MED ORDER — LACTATED RINGERS IV SOLN
INTRAVENOUS | Status: AC | PRN
  Administered 2022-05-02: 1000 mL

## 2022-05-02 MED ORDER — OXYCODONE HCL 5 MG PO TABS
5.0000 mg | ORAL_TABLET | ORAL | Status: DC | PRN
  Administered 2022-05-03 (×3): 5 mg via ORAL
  Filled 2022-05-02: qty 2
  Filled 2022-05-02 (×4): qty 1
  Filled 2022-05-02: qty 2

## 2022-05-02 MED ORDER — ACETAMINOPHEN 500 MG PO TABS
1000.0000 mg | ORAL_TABLET | Freq: Four times a day (QID) | ORAL | Status: DC
  Administered 2022-05-02 – 2022-05-03 (×3): 1000 mg via ORAL
  Filled 2022-05-02 (×6): qty 2

## 2022-05-02 MED ORDER — BUPIVACAINE HCL 0.25 % IJ SOLN
INTRAMUSCULAR | Status: AC
  Filled 2022-05-02: qty 1

## 2022-05-02 MED ORDER — EPHEDRINE 5 MG/ML INJ
INTRAVENOUS | Status: AC
  Filled 2022-05-02: qty 5

## 2022-05-02 MED ORDER — LIDOCAINE 2% (20 MG/ML) 5 ML SYRINGE
INTRAMUSCULAR | Status: DC | PRN
  Administered 2022-05-02: 60 mg via INTRAVENOUS
  Administered 2022-05-02: 80 mg via INTRAVENOUS

## 2022-05-02 MED ORDER — CEFAZOLIN SODIUM-DEXTROSE 2-4 GM/100ML-% IV SOLN
2.0000 g | INTRAVENOUS | Status: AC
  Administered 2022-05-02: 2 g via INTRAVENOUS
  Filled 2022-05-02: qty 100

## 2022-05-02 MED ORDER — SUGAMMADEX SODIUM 200 MG/2ML IV SOLN
INTRAVENOUS | Status: DC | PRN
  Administered 2022-05-02: 200 mg via INTRAVENOUS

## 2022-05-02 MED ORDER — TAMSULOSIN HCL 0.4 MG PO CAPS
0.4000 mg | ORAL_CAPSULE | Freq: Every day | ORAL | Status: DC
  Administered 2022-05-03 – 2022-05-06 (×4): 0.4 mg via ORAL
  Filled 2022-05-02 (×4): qty 1

## 2022-05-02 MED ORDER — ORAL CARE MOUTH RINSE: 15.0000 mL | Freq: Once | OROMUCOSAL | Status: AC

## 2022-05-02 MED ORDER — ACETAMINOPHEN 500 MG PO TABS: 1000.0000 mg | ORAL_TABLET | Freq: Once | ORAL | Status: DC | PRN

## 2022-05-02 MED ORDER — LACTATED RINGERS IV SOLN
INTRAVENOUS | Status: DC
Start: 2022-05-02 — End: 2022-05-02

## 2022-05-02 MED ORDER — 0.9 % SODIUM CHLORIDE (POUR BTL) OPTIME
TOPICAL | Status: DC | PRN
  Administered 2022-05-02: 1000 mL

## 2022-05-02 MED ORDER — PROPOFOL 10 MG/ML IV BOLUS
INTRAVENOUS | Status: DC | PRN
  Administered 2022-05-02: 160 mg via INTRAVENOUS

## 2022-05-02 MED ORDER — ACETAMINOPHEN 160 MG/5ML PO SOLN: 1000.0000 mg | Freq: Once | ORAL | Status: DC | PRN

## 2022-05-02 MED ORDER — ONDANSETRON HCL 4 MG/2ML IJ SOLN
4.0000 mg | Freq: Four times a day (QID) | INTRAMUSCULAR | Status: DC | PRN
  Administered 2022-05-02: 4 mg via INTRAVENOUS
  Filled 2022-05-02 (×2): qty 2

## 2022-05-02 MED ORDER — DIPHENHYDRAMINE HCL 12.5 MG/5ML PO ELIX: 12.5000 mg | ORAL_SOLUTION | Freq: Four times a day (QID) | ORAL | Status: DC | PRN

## 2022-05-02 MED ORDER — GLYCOPYRROLATE 0.2 MG/ML IJ SOLN
INTRAMUSCULAR | Status: AC
  Filled 2022-05-02: qty 1

## 2022-05-02 MED ORDER — ROCURONIUM BROMIDE 10 MG/ML (PF) SYRINGE
PREFILLED_SYRINGE | INTRAVENOUS | Status: DC | PRN
  Administered 2022-05-02: 50 mg via INTRAVENOUS

## 2022-05-02 MED ORDER — OXYCODONE HCL 5 MG/5ML PO SOLN: 5.0000 mg | Freq: Once | ORAL | Status: AC | PRN

## 2022-05-02 MED ORDER — DIPHENHYDRAMINE HCL 50 MG/ML IJ SOLN: 12.5000 mg | Freq: Four times a day (QID) | INTRAMUSCULAR | Status: DC | PRN

## 2022-05-02 MED ORDER — LIDOCAINE HCL (PF) 2 % IJ SOLN
INTRAMUSCULAR | Status: AC
  Filled 2022-05-02: qty 5

## 2022-05-02 MED ORDER — ONDANSETRON 4 MG PO TBDP: 4.0000 mg | ORAL_TABLET | Freq: Four times a day (QID) | ORAL | Status: DC | PRN

## 2022-05-02 MED ORDER — ENOXAPARIN SODIUM 40 MG/0.4ML IJ SOSY
40.0000 mg | PREFILLED_SYRINGE | INTRAMUSCULAR | Status: DC
  Administered 2022-05-03 – 2022-05-05 (×3): 40 mg via SUBCUTANEOUS
  Filled 2022-05-02 (×4): qty 0.4

## 2022-05-02 MED ORDER — FENTANYL CITRATE (PF) 250 MCG/5ML IJ SOLN
INTRAMUSCULAR | Status: DC | PRN
  Administered 2022-05-02 (×4): 50 ug via INTRAVENOUS

## 2022-05-02 MED ORDER — GABAPENTIN 300 MG PO CAPS
300.0000 mg | ORAL_CAPSULE | Freq: Two times a day (BID) | ORAL | Status: DC
  Administered 2022-05-02 – 2022-05-03 (×2): 300 mg via ORAL
  Filled 2022-05-02 (×2): qty 1

## 2022-05-02 MED ORDER — POTASSIUM CHLORIDE IN NACL 20-0.9 MEQ/L-% IV SOLN
INTRAVENOUS | Status: DC
  Filled 2022-05-02 (×2): qty 1000

## 2022-05-02 MED ORDER — FENTANYL CITRATE PF 50 MCG/ML IJ SOSY
PREFILLED_SYRINGE | INTRAMUSCULAR | Status: AC
  Filled 2022-05-02: qty 2

## 2022-05-02 MED ORDER — MORPHINE SULFATE (PF) 2 MG/ML IV SOLN: 2.0000 mg | INTRAVENOUS | Status: DC | PRN

## 2022-05-02 MED ORDER — ROCURONIUM BROMIDE 10 MG/ML (PF) SYRINGE
PREFILLED_SYRINGE | INTRAVENOUS | Status: AC
Start: 2022-05-02 — End: ?
  Filled 2022-05-02: qty 10

## 2022-05-02 MED ORDER — OXYCODONE HCL 5 MG PO TABS
5.0000 mg | ORAL_TABLET | Freq: Once | ORAL | Status: AC | PRN
  Administered 2022-05-02: 5 mg via ORAL

## 2022-05-02 MED ORDER — ACETAMINOPHEN 500 MG PO TABS
1000.0000 mg | ORAL_TABLET | ORAL | Status: AC
  Administered 2022-05-02: 1000 mg via ORAL
  Filled 2022-05-02: qty 2

## 2022-05-02 MED ORDER — FENTANYL CITRATE PF 50 MCG/ML IJ SOSY
25.0000 ug | PREFILLED_SYRINGE | INTRAMUSCULAR | Status: AC | PRN
  Administered 2022-05-02: 25 ug via INTRAVENOUS
  Administered 2022-05-02: 50 ug via INTRAVENOUS
  Administered 2022-05-02 (×4): 25 ug via INTRAVENOUS

## 2022-05-02 MED ORDER — EPHEDRINE SULFATE-NACL 50-0.9 MG/10ML-% IV SOSY
PREFILLED_SYRINGE | INTRAVENOUS | Status: DC | PRN
  Administered 2022-05-02: 5 mg via INTRAVENOUS

## 2022-05-02 SURGICAL SUPPLY — 47 items
ADH SKN CLS APL DERMABOND .7 (GAUZE/BANDAGES/DRESSINGS) ×1
APPLIER CLIP 5 13 M/L LIGAMAX5 (MISCELLANEOUS)
APR CLP MED LRG 5 ANG JAW (MISCELLANEOUS)
BAG COUNTER SPONGE SURGICOUNT (BAG) IMPLANT
BAG SPNG CNTER NS LX DISP (BAG)
BINDER ABDOMINAL 12 ML 46-62 (SOFTGOODS) IMPLANT
CABLE HIGH FREQUENCY MONO STRZ (ELECTRODE) ×1 IMPLANT
CHLORAPREP W/TINT 26 (MISCELLANEOUS) ×1 IMPLANT
CLIP APPLIE 5 13 M/L LIGAMAX5 (MISCELLANEOUS) IMPLANT
DERMABOND IMPLANT
DERMABOND ADVANCED (GAUZE/BANDAGES/DRESSINGS) ×1
DERMABOND ADVANCED .7 DNX12 (GAUZE/BANDAGES/DRESSINGS) ×1 IMPLANT
DEVICE SECURE STRAP 25 ABSORB (INSTRUMENTS) IMPLANT
DEVICE TROCAR PUNCTURE CLOSURE (ENDOMECHANICALS) ×1 IMPLANT
ELECT REM PT RETURN 15FT ADLT (MISCELLANEOUS) ×1 IMPLANT
GLOVE BIO SURGEON STRL SZ7.5 (GLOVE) ×1 IMPLANT
GOWN STRL REUS W/ TWL XL LVL3 (GOWN DISPOSABLE) ×3 IMPLANT
GOWN STRL REUS W/TWL XL LVL3 (GOWN DISPOSABLE) ×3
IRRIG SUCT STRYKERFLOW 2 WTIP (MISCELLANEOUS) ×1
IRRIGATION SUCT STRKRFLW 2 WTP (MISCELLANEOUS) IMPLANT
KIT BASIN OR (CUSTOM PROCEDURE TRAY) ×1 IMPLANT
KIT TURNOVER KIT A (KITS) IMPLANT
MARKER SKIN DUAL TIP RULER LAB (MISCELLANEOUS) ×1 IMPLANT
MESH VENTRALIGHT ST 6IN CRC (Mesh General) IMPLANT
NDL SPNL 22GX3.5 QUINCKE BK (NEEDLE) ×1 IMPLANT
NEEDLE SPNL 22GX3.5 QUINCKE BK (NEEDLE) ×1 IMPLANT
PAD POSITIONING PINK XL (MISCELLANEOUS) IMPLANT
PENCIL SMOKE EVACUATOR (MISCELLANEOUS) IMPLANT
PROTECTOR NERVE ULNAR (MISCELLANEOUS) IMPLANT
SCISSORS LAP 5X35 DISP (ENDOMECHANICALS) IMPLANT
SET TUBE SMOKE EVAC HIGH FLOW (TUBING) ×1 IMPLANT
SHEARS HARMONIC ACE PLUS 36CM (ENDOMECHANICALS) IMPLANT
SLEEVE Z-THREAD 5X100MM (TROCAR) ×1 IMPLANT
SOL ANTI FOG 6CC (MISCELLANEOUS) ×1 IMPLANT
SOLUTION ANTI FOG 6CC (MISCELLANEOUS) ×1
SPIKE FLUID TRANSFER (MISCELLANEOUS) ×1 IMPLANT
STRIP CLOSURE SKIN 1/2X4 (GAUZE/BANDAGES/DRESSINGS) IMPLANT
SUT MNCRL AB 4-0 PS2 18 (SUTURE) IMPLANT
SUT NOVA NAB DX-16 0-1 5-0 T12 (SUTURE) ×1 IMPLANT
TACKER 5MM HERNIA 3.5CML NAB (ENDOMECHANICALS) IMPLANT
TAPE CLOTH 4X10 WHT NS (GAUZE/BANDAGES/DRESSINGS) IMPLANT
TOWEL OR 17X26 10 PK STRL BLUE (TOWEL DISPOSABLE) ×1 IMPLANT
TOWEL OR NON WOVEN STRL DISP B (DISPOSABLE) ×1 IMPLANT
TRAY FOLEY MTR SLVR 16FR STAT (SET/KITS/TRAYS/PACK) ×1 IMPLANT
TRAY LAPAROSCOPIC (CUSTOM PROCEDURE TRAY) ×1 IMPLANT
TROCAR 11X100 Z THREAD (TROCAR) ×1 IMPLANT
TROCAR Z-THREAD OPTICAL 5X100M (TROCAR) ×1 IMPLANT

## 2022-05-02 NOTE — Transfer of Care (Signed)
Immediate Anesthesia Transfer of Care Note  Patient: Samuel Lawson  Procedure(s) Performed: LAPAROSCOPIC INCISIONAL HERNIA REPAIR WITH MESH (Abdomen)  Patient Location: PACU  Anesthesia Type:General  Level of Consciousness: awake, alert  and oriented  Airway & Oxygen Therapy: Patient Spontanous Breathing and Patient connected to face mask oxygen  Post-op Assessment: Report given to RN and Post -op Vital signs reviewed and stable  Post vital signs: Reviewed and stable  Last Vitals:  Vitals Value Taken Time  BP 112/75 05/02/22 0833  Temp 35.7 C 05/02/22 0833  Pulse 56 05/02/22 0839  Resp 11 05/02/22 0839  SpO2 95 % 05/02/22 0839  Vitals shown include unvalidated device data.  Last Pain:  Vitals:   05/02/22 0623  TempSrc: Oral  PainSc:          Complications: No notable events documented.

## 2022-05-02 NOTE — Anesthesia Procedure Notes (Signed)
Procedure Name: Intubation Date/Time: 05/02/2022 7:29 AM  Performed by: Paris Hohn D, CRNAPre-anesthesia Checklist: Patient identified, Emergency Drugs available, Suction available and Patient being monitored Patient Re-evaluated:Patient Re-evaluated prior to induction Oxygen Delivery Method: Circle system utilized Preoxygenation: Pre-oxygenation with 100% oxygen Induction Type: IV induction Ventilation: Mask ventilation without difficulty Laryngoscope Size: Mac and 4 Grade View: Grade I Tube type: Oral Tube size: 7.5 mm Number of attempts: 1 Airway Equipment and Method: Stylet and Oral airway Placement Confirmation: ETT inserted through vocal cords under direct vision, positive ETCO2 and breath sounds checked- equal and bilateral Secured at: 22 cm Tube secured with: Tape Dental Injury: Teeth and Oropharynx as per pre-operative assessment

## 2022-05-02 NOTE — Op Note (Signed)
LAPAROSCOPIC INCISIONAL HERNIA REPAIR WITH MESH  Procedure Note  Samuel Lawson 05/02/2022   Pre-op Diagnosis: INCISIONAL HERNIA     Post-op Diagnosis: INCISIONAL HERNIA (6 CM)  Procedure(s): LAPAROSCOPIC INCISIONAL HERNIA REPAIR WITH MESH  6 CM FASCIAL DEFECT 15 CM ROUND VENTRALITE ST MESH  Surgeon(s): Coralie Keens, MD  Anesthesia: General  Staff:  Circulator: Roselind Rily, RN Relief Circulator: Reeves Dam, RN Scrub Person: Oneta Rack, Esmond Plants  Estimated Blood Loss: Minimal               Findings: The patient was found to have a 6 cm fascial defect in the upper midline which was repaired with a 15 cm round Ventralight ST Prolene mesh from [place]  Procedure: The patient was brought to the operating room and identified as the correct patient.  He was placed upon the operating room table and general anesthesia was induced.  His abdomen was then prepped and draped in usual sterile fashion.  I made a small incision in the patient's left upper quadrant and used a 5 mm trocar and Optiview camera to slowly transverse all layers of the abdominal wall under direct vision and gained entrance into the abdominal cavity.  Insufflation of the abdomen was then begun.  I evaluated the trocar site and saw no evidence of injury.  I could then identify the incisional hernia at the patient's midline.  I placed a 12 mm trocar in the patient's previous scar at the left mid abdomen and a 5 mm trocar in the patient's left lower abdomen on the left side through previous scar as well both under direct vision.  The patient had 3 adhesions of omentum to the trocar sites which are brought down with the cautery.  There was no bowel currently involved in the large incisional hernia.  There was omentum going into the hernia sac which I was able to remove using the electrocautery and reduced back into the abdominal cavity.  Hemostasis on this omentum was achieved with the  cautery. The fascial defect measured approximate 6 cm.  I brought a 10 cm round Ventralight ST Prolene patch onto the field.  I placed 4 separate #1 Novafil sutures into the corners of the mesh.  The mesh was then rolled up and soaked in saline.  I then placed the mesh into the abdominal cavity through the 12 mm trocar under direct vision.  I then unrolled the mesh.  I made 4 separate stab incisions with a #11 blade scalpel and then used the suture passer to pull all the sutures up through the abdominal wall and 4 locations around the fascial defect.  I then pulled the sutures up bring the mesh up taut against the abdominal wall.  This widely covered the fascial defect over 4 cm in all directions.  The sutures were tied in place.  I then further secured the mesh to the abdominal wall with the absorbable tacker circumferentially.  Again, wide coverage of the fascial defect appeared to be achieved and the mesh appeared well secured.  I again evaluated the abdominal cavity and hemostasis was felt to be achieved.  All ports were then removed under direct vision and the abdomen was deflated.  I closed the 12 mm trocar with a figure-of-eight 0 Vicryl suture.  All incisions were then anesthetized with Marcaine and closed with 4-0 Monocryl sutures.  The 4 stab sites and the trocar sites were then closed with Dermabond.  The patient tolerated the procedure well.  All the counts were correct at the end of the procedure.  The patient was then extubated in the operating room and taken in a stable condition to the recovery room.          Coralie Keens   Date: 05/02/2022  Time: 8:28 AM

## 2022-05-02 NOTE — Anesthesia Postprocedure Evaluation (Signed)
Anesthesia Post Note  Patient: Samuel Lawson  Procedure(s) Performed: LAPAROSCOPIC INCISIONAL HERNIA REPAIR WITH MESH (Abdomen)     Patient location during evaluation: PACU Anesthesia Type: General Level of consciousness: awake and alert Pain management: pain level controlled Vital Signs Assessment: post-procedure vital signs reviewed and stable Respiratory status: spontaneous breathing, nonlabored ventilation, respiratory function stable and patient connected to nasal cannula oxygen Cardiovascular status: blood pressure returned to baseline and stable Postop Assessment: no apparent nausea or vomiting Anesthetic complications: no   No notable events documented.  Last Vitals:  Vitals:   05/02/22 1759 05/02/22 1759  BP:  124/70  Pulse: (!) 53 60  Resp:  20  Temp:  (!) 36.4 C  SpO2:  96%    Last Pain:  Vitals:   05/02/22 1759  TempSrc: Oral  PainSc:                  Samuel Lawson

## 2022-05-02 NOTE — Interval H&P Note (Signed)
History and Physical Interval Note: no change in H and P  05/02/2022 7:08 AM  Samuel Lawson  has presented today for surgery, with the diagnosis of INCISIONAL HERNIA.  The various methods of treatment have been discussed with the patient and family. After consideration of risks, benefits and other options for treatment, the patient has consented to  Procedure(s): [place] (N/A) as a surgical intervention.  The patient's history has been reviewed, patient examined, no change in status, stable for surgery.  I have reviewed the patient's chart and labs.  Questions were answered to the patient's satisfaction.     Coralie Keens

## 2022-05-02 NOTE — Anesthesia Preprocedure Evaluation (Signed)
Anesthesia Evaluation  Patient identified by MRN, date of birth, ID band Patient awake    Reviewed: Allergy & Precautions, NPO status , Patient's Chart, lab work & pertinent test results, reviewed documented beta blocker date and time   History of Anesthesia Complications Negative for: history of anesthetic complications  Airway Mallampati: II  TM Distance: >3 FB Neck ROM: Full    Dental  (+) Dental Advisory Given, Missing, Caps,    Pulmonary Current Smoker and Patient abstained from smoking.,    breath sounds clear to auscultation       Cardiovascular hypertension, Pt. on medications and Pt. 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 Wall thickness was increased in a pattern of moderate LVH. Systolic function was normal. The estimated ejection fraction was in the range of 60% to 65%. Wall motion was normal; there were no regional wall motion abnormalities. Left ventricular diastolic function parameters were normal.  - Left atrium: The atrium was normal in size.  - Inferior vena cava: The vessel was normal in size. The  respirophasic diameter changes were in the normal range (>= 50%), consistent with normal central venous pressure.  - Pericardium, extracardiac: There was no pericardial effusion.   Stent x 2 2001   Neuro/Psych negative neurological ROS  negative psych ROS   GI/Hepatic Neg liver ROS, GERD  Medicated and Controlled,  Endo/Other  Hyperlipidemia Obesity  Renal/GU Renal diseaseLeft Renal mass s/p resection   BPH    Musculoskeletal negative musculoskeletal ROS (+)   Abdominal (+) + obese,   Peds  Hematology negative hematology ROS (+)   Anesthesia Other Findings   Reproductive/Obstetrics                              Anesthesia Physical Anesthesia Plan  ASA: 3  Anesthesia Plan: General   Post-op Pain Management: Tylenol PO (pre-op)*   Induction: Intravenous  PONV Risk Score and Plan: 1 and Ondansetron and Dexamethasone  Airway Management Planned: Oral ETT  Additional Equipment: None  Intra-op Plan:   Post-operative Plan: Extubation in OR  Informed Consent: I have reviewed the patients History and Physical, chart, labs and discussed the procedure including the risks, benefits and alternatives for the proposed anesthesia with the patient or authorized representative who has indicated his/her understanding and acceptance.     Dental advisory given  Plan Discussed with: CRNA  Anesthesia Plan Comments:         Anesthesia Quick Evaluation

## 2022-05-03 ENCOUNTER — Observation Stay (HOSPITAL_COMMUNITY): Payer: Medicare HMO

## 2022-05-03 ENCOUNTER — Other Ambulatory Visit: Payer: Self-pay

## 2022-05-03 ENCOUNTER — Encounter (HOSPITAL_COMMUNITY): Payer: Self-pay | Admitting: Surgery

## 2022-05-03 DIAGNOSIS — R058 Other specified cough: Secondary | ICD-10-CM | POA: Diagnosis not present

## 2022-05-03 DIAGNOSIS — K432 Incisional hernia without obstruction or gangrene: Secondary | ICD-10-CM | POA: Diagnosis not present

## 2022-05-03 DIAGNOSIS — J90 Pleural effusion, not elsewhere classified: Secondary | ICD-10-CM | POA: Diagnosis not present

## 2022-05-03 DIAGNOSIS — R0602 Shortness of breath: Secondary | ICD-10-CM | POA: Diagnosis not present

## 2022-05-03 LAB — CBC
HCT: 42.3 % (ref 39.0–52.0)
Hemoglobin: 14 g/dL (ref 13.0–17.0)
MCH: 31.4 pg (ref 26.0–34.0)
MCHC: 33.1 g/dL (ref 30.0–36.0)
MCV: 94.8 fL (ref 80.0–100.0)
Platelets: 141 10*3/uL — ABNORMAL LOW (ref 150–400)
RBC: 4.46 MIL/uL (ref 4.22–5.81)
RDW: 12.8 % (ref 11.5–15.5)
WBC: 13.2 10*3/uL — ABNORMAL HIGH (ref 4.0–10.5)
nRBC: 0 % (ref 0.0–0.2)

## 2022-05-03 LAB — BASIC METABOLIC PANEL
Anion gap: 5 (ref 5–15)
BUN: 15 mg/dL (ref 8–23)
CO2: 27 mmol/L (ref 22–32)
Calcium: 9 mg/dL (ref 8.9–10.3)
Chloride: 111 mmol/L (ref 98–111)
Creatinine, Ser: 0.88 mg/dL (ref 0.61–1.24)
GFR, Estimated: >60 mL/min (ref >60)
Glucose, Bld: 116 mg/dL — ABNORMAL HIGH (ref 70–99)
Potassium: 3.9 mmol/L (ref 3.5–5.1)
Sodium: 143 mmol/L (ref 135–145)

## 2022-05-03 MED ORDER — SIMETHICONE 80 MG PO CHEW
80.0000 mg | CHEWABLE_TABLET | Freq: Four times a day (QID) | ORAL | Status: DC | PRN
  Administered 2022-05-03: 80 mg via ORAL

## 2022-05-03 MED ORDER — OXYCODONE HCL 5 MG PO TABS
5.0000 mg | ORAL_TABLET | Freq: Four times a day (QID) | ORAL | 0 refills | Status: DC | PRN
  Filled 2022-05-03: qty 25, 7d supply, fill #0

## 2022-05-03 MED ORDER — METHOCARBAMOL 1000 MG/10ML IJ SOLN: 1000.0000 mg | Freq: Four times a day (QID) | INTRAVENOUS | Status: DC | PRN

## 2022-05-03 MED ORDER — NAPHAZOLINE-GLYCERIN 0.012-0.25 % OP SOLN: 1.0000 [drp] | Freq: Four times a day (QID) | OPHTHALMIC | Status: DC | PRN

## 2022-05-03 MED ORDER — FLUTICASONE PROPIONATE 50 MCG/ACT NA SUSP
2.0000 | Freq: Every day | NASAL | Status: DC
  Administered 2022-05-03 – 2022-05-06 (×4): 2 via NASAL
  Filled 2022-05-03: qty 16

## 2022-05-03 MED ORDER — FLUTICASONE PROPIONATE 50 MCG/ACT NA SUSP
2.0000 | NASAL | Status: DC | PRN
  Administered 2022-05-03: 2 via NASAL
  Filled 2022-05-03: qty 16

## 2022-05-03 MED ORDER — ASPIRIN 81 MG PO TBEC
81.0000 mg | DELAYED_RELEASE_TABLET | Freq: Every day | ORAL | Status: DC
Start: 2022-05-03 — End: 2022-05-06
  Administered 2022-05-03 – 2022-05-05 (×3): 81 mg via ORAL
  Filled 2022-05-03 (×3): qty 1

## 2022-05-03 MED ORDER — METHOCARBAMOL 500 MG PO TABS
1000.0000 mg | ORAL_TABLET | Freq: Four times a day (QID) | ORAL | Status: DC | PRN
  Filled 2022-05-03: qty 2

## 2022-05-03 MED ORDER — SALINE SPRAY 0.65 % NA SOLN: 1.0000 | Freq: Four times a day (QID) | NASAL | Status: DC | PRN

## 2022-05-03 MED ORDER — FLUTICASONE PROPIONATE 50 MCG/ACT NA SUSP: 2.0000 | Freq: Every day | NASAL | Status: DC

## 2022-05-03 MED ORDER — GABAPENTIN 300 MG PO CAPS
300.0000 mg | ORAL_CAPSULE | Freq: Three times a day (TID) | ORAL | Status: DC
  Administered 2022-05-03: 300 mg via ORAL
  Filled 2022-05-03: qty 1

## 2022-05-03 MED ORDER — GUAIFENESIN-DM 100-10 MG/5ML PO SYRP
15.0000 mL | ORAL_SOLUTION | ORAL | Status: DC | PRN
  Administered 2022-05-03 – 2022-05-05 (×6): 15 mL via ORAL
  Filled 2022-05-03 (×7): qty 20

## 2022-05-03 MED ORDER — ALBUTEROL SULFATE (2.5 MG/3ML) 0.083% IN NEBU
2.5000 mg | INHALATION_SOLUTION | Freq: Four times a day (QID) | RESPIRATORY_TRACT | Status: DC | PRN
  Administered 2022-05-05: 2.5 mg via RESPIRATORY_TRACT
  Filled 2022-05-03: qty 3

## 2022-05-03 MED ORDER — ALBUTEROL SULFATE (2.5 MG/3ML) 0.083% IN NEBU
2.5000 mg | INHALATION_SOLUTION | Freq: Once | RESPIRATORY_TRACT | Status: AC
  Administered 2022-05-03: 2.5 mg via RESPIRATORY_TRACT
  Filled 2022-05-03: qty 3

## 2022-05-03 MED ORDER — FUROSEMIDE 10 MG/ML IJ SOLN
40.0000 mg | Freq: Once | INTRAMUSCULAR | Status: AC
  Administered 2022-05-03: 40 mg via INTRAVENOUS
  Filled 2022-05-03: qty 4

## 2022-05-03 MED ORDER — NICOTINE 14 MG/24HR TD PT24
14.0000 mg | MEDICATED_PATCH | Freq: Every day | TRANSDERMAL | Status: DC
  Administered 2022-05-03 – 2022-05-06 (×4): 14 mg via TRANSDERMAL
  Filled 2022-05-03 (×4): qty 1

## 2022-05-03 NOTE — Progress Notes (Signed)
1 Day Post-Op   Subjective/Chief Complaint: Complains of incisional pain/abdominal wall pain when coughing Tolerating po   Objective: Vital signs in last 24 hours: Temp:  [96.2 F (35.7 C)-98.6 F (37 C)] 97.6 F (36.4 C) (08/25 0506) Pulse Rate:  [47-62] 55 (08/25 0506) Resp:  [11-20] 16 (08/25 0506) BP: (96-124)/(49-83) 124/70 (08/25 0506) SpO2:  [91 %-98 %] 94 % (08/25 0506) Weight:  [85.5 kg] 85.5 kg (08/24 1332)    Intake/Output from previous day: 08/24 0701 - 08/25 0700 In: 803.5 [I.V.:803.5] Out: 1950 [Urine:1950] Intake/Output this shift: No intake/output data recorded.  Exam: Awake and alert Lungs clear CV RRR Abdomen soft, appropriately tender  Lab Results:  Recent Labs    05/03/22 0500  WBC 13.2*  HGB 14.0  HCT 42.3  PLT 141*   BMET Recent Labs    05/03/22 0500  NA 143  K 3.9  CL 111  CO2 27  GLUCOSE 116*  BUN 15  CREATININE 0.88  CALCIUM 9.0   PT/INR No results for input(s): "LABPROT", "INR" in the last 72 hours. ABG No results for input(s): "PHART", "HCO3" in the last 72 hours.  Invalid input(s): "PCO2", "PO2"  Studies/Results: No results found.  Anti-infectives: Anti-infectives (From admission, onward)    Start     Dose/Rate Route Frequency Ordered Stop   05/02/22 0600  ceFAZolin (ANCEF) IVPB 2g/100 mL premix        2 g 200 mL/hr over 30 Minutes Intravenous On call to O.R. 05/02/22 0525 05/02/22 0731       Assessment/Plan: s/p Procedure(s): LAPAROSCOPIC INCISIONAL HERNIA REPAIR WITH MESH (N/A)  Given his coughing from smoking and abdominal wall pain, needs to work on IS and ambulating May not be ready for discharge today    Coralie Keens MD 05/03/2022

## 2022-05-03 NOTE — Progress Notes (Signed)
Pt needs assistance getting up.   Pt would complain he feels like he is going to pass out once up.   Vitals are within normal limits. Will monitor pt.

## 2022-05-03 NOTE — Discharge Instructions (Signed)
[facility] _______[facility]  UMBILICAL OR INGUINAL HERNIA REPAIR: POST OP INSTRUCTIONS  Always review your discharge instruction sheet given to you by the facility where your surgery was performed. IF YOU HAVE DISABILITY OR FAMILY LEAVE FORMS, YOU MUST BRING THEM TO THE OFFICE FOR PROCESSING.   DO NOT GIVE THEM TO YOUR DOCTOR.  1. A  prescription for pain medication may be given to you upon discharge.  Take your pain medication as prescribed, if needed.  If narcotic pain medicine is not needed, then you may take acetaminophen (Tylenol) or ibuprofen (Advil) as needed. 2. Take your usually prescribed medications unless otherwise directed. If you need a refill on your pain medication, please contact your pharmacy.  They will contact our office to request authorization. Prescriptions will not be filled after 5 pm or on week-ends. 3. You should follow a light diet the first 24 hours after arrival home, such as soup and crackers, etc.  Be sure to include lots of fluids daily.  Resume your normal diet the day after surgery. 4.Most patients will experience some swelling and bruising around the umbilicus or in the groin and scrotum.  Ice packs and reclining will help.  Swelling and bruising can take several days to resolve.  6. It is common to experience some constipation if taking pain medication after surgery.  Increasing fluid intake and taking a stool softener (such as Colace) will usually help or prevent this problem from occurring.  A mild laxative (Milk of Magnesia or Miralax) should be taken according to package directions if there are no bowel movements after 48 hours. 7. Unless discharge instructions indicate otherwise, you may remove your bandages 24-48 hours after surgery, and you may shower at that time.  You may have steri-strips (small skin tapes) in place directly over the incision.  These strips should be left on the skin for 7-10 days.  If your surgeon used skin glue on the  incision, you may shower in 24 hours.  The glue will flake off over the next 2-3 weeks.  Any sutures or staples will be removed at the office during your follow-up visit. 8. ACTIVITIES:  You may resume regular (light) daily activities beginning the next day--such as daily self-care, walking, climbing stairs--gradually increasing activities as tolerated.  You may have sexual intercourse when it is comfortable.  Refrain from any heavy lifting or straining until approved by your doctor.  a.You may drive when you are no longer taking prescription pain medication, you can comfortably wear a seatbelt, and you can safely maneuver your car and apply brakes. b.RETURN TO WORK:   _____________________________________________  9.You should see your doctor in the office for a follow-up appointment approximately 2-3 weeks after your surgery.  Make sure that you call for this appointment within a day or two after you arrive home to insure a convenient appointment time. 10.OTHER INSTRUCTIONS: _YOU MAY SHOWER NO LIFTING MORE THAN 15 POUNDS FOR 6 WEEKS DO NOT SMOKE ICE PACK, TYLENOL, IBUPROFEN ALSO FOR PAIN________________________    _____________________________________  WHEN TO CALL YOUR DOCTOR: Fever over 101.0 Inability to urinate Nausea and/or vomiting Extreme swelling or bruising Continued bleeding from incision. Increased pain, redness, or drainage from the incision  The clinic staff is available to answer your questions during regular business hours.  Please don't hesitate to call and ask to speak to one of the nurses for clinical concerns.  If you have a medical emergency, go to the nearest emergency room or call 911.  A surgeon from [facility] is always on call at the hospital   [address] [place], [place], [place]  [address] ?  [address], [place], [place]   [address] [phone] ? [phone] ? FAX [phone] Web site: [email or web address]

## 2022-05-03 NOTE — Progress Notes (Signed)
  Transition of Care (TOC) Screening Note   Patient Details  Name: Samuel Lawson Date of Birth: December 14, 1945   Transition of Care [facility]) CM/SW Contact:    Lennart Pall, LCSW Phone Number: 05/03/2022, 11:22 AM    Transition of Care Department [facility]) has reviewed patient and no TOC needs have been identified at this time. We will continue to monitor patient advancement through interdisciplinary progression rounds. If new patient transition needs arise, please place a TOC consult.

## 2022-05-04 DIAGNOSIS — K432 Incisional hernia without obstruction or gangrene: Secondary | ICD-10-CM | POA: Diagnosis present

## 2022-05-04 DIAGNOSIS — I2510 Atherosclerotic heart disease of native coronary artery without angina pectoris: Secondary | ICD-10-CM | POA: Diagnosis present

## 2022-05-04 DIAGNOSIS — Z79899 Other long term (current) drug therapy: Secondary | ICD-10-CM | POA: Diagnosis not present

## 2022-05-04 DIAGNOSIS — K219 Gastro-esophageal reflux disease without esophagitis: Secondary | ICD-10-CM | POA: Diagnosis present

## 2022-05-04 DIAGNOSIS — I10 Essential (primary) hypertension: Secondary | ICD-10-CM | POA: Diagnosis present

## 2022-05-04 DIAGNOSIS — R42 Dizziness and giddiness: Secondary | ICD-10-CM | POA: Diagnosis present

## 2022-05-04 DIAGNOSIS — Z905 Acquired absence of kidney: Secondary | ICD-10-CM | POA: Diagnosis not present

## 2022-05-04 DIAGNOSIS — R7303 Prediabetes: Secondary | ICD-10-CM | POA: Diagnosis present

## 2022-05-04 DIAGNOSIS — F419 Anxiety disorder, unspecified: Secondary | ICD-10-CM | POA: Diagnosis present

## 2022-05-04 DIAGNOSIS — Z7982 Long term (current) use of aspirin: Secondary | ICD-10-CM | POA: Diagnosis not present

## 2022-05-04 DIAGNOSIS — E785 Hyperlipidemia, unspecified: Secondary | ICD-10-CM | POA: Diagnosis present

## 2022-05-04 DIAGNOSIS — F411 Generalized anxiety disorder: Secondary | ICD-10-CM | POA: Diagnosis not present

## 2022-05-04 DIAGNOSIS — K660 Peritoneal adhesions (postprocedural) (postinfection): Secondary | ICD-10-CM | POA: Diagnosis present

## 2022-05-04 DIAGNOSIS — C642 Malignant neoplasm of left kidney, except renal pelvis: Secondary | ICD-10-CM | POA: Diagnosis present

## 2022-05-04 DIAGNOSIS — R058 Other specified cough: Secondary | ICD-10-CM | POA: Diagnosis not present

## 2022-05-04 MED ORDER — VITAMIN D 25 MCG (1000 UNIT) PO TABS
5000.0000 [IU] | ORAL_TABLET | Freq: Every day | ORAL | Status: DC
  Administered 2022-05-04 – 2022-05-06 (×3): 5000 [IU] via ORAL
  Filled 2022-05-04 (×3): qty 5

## 2022-05-04 MED ORDER — ATORVASTATIN CALCIUM 40 MG PO TABS
40.0000 mg | ORAL_TABLET | Freq: Every day | ORAL | Status: DC
  Administered 2022-05-04 – 2022-05-05 (×2): 40 mg via ORAL
  Filled 2022-05-04 (×2): qty 1

## 2022-05-04 MED ORDER — ADULT MULTIVITAMIN W/MINERALS CH
1.0000 | ORAL_TABLET | Freq: Every day | ORAL | Status: DC
  Administered 2022-05-04 – 2022-05-06 (×3): 1 via ORAL
  Filled 2022-05-04 (×3): qty 1

## 2022-05-04 NOTE — Progress Notes (Signed)
Spoke with Dr Johney Maine about pt's concerns regarding xrays done Yesterday. MD said Dr Donne Hazel would round in am. See new orders.

## 2022-05-04 NOTE — Progress Notes (Signed)
2 Days Post-Op   Subjective/Chief Complaint: Coughing, some flatus, not oob at all   Objective: Vital signs in last 24 hours: Temp:  [97.5 F (36.4 C)-98.1 F (36.7 C)] 98.1 F (36.7 C) (08/26 0406) Pulse Rate:  [55-85] 72 (08/26 0406) Resp:  [18-20] 18 (08/26 0406) BP: (114-147)/(61-66) 114/66 (08/26 0406) SpO2:  [83 %-95 %] 95 % (08/26 0406)    Intake/Output from previous day: 08/25 0701 - 08/26 0700 In: 0  Out: 2250 [Urine:2250] Intake/Output this shift: No intake/output data recorded.  General somnolent Pulm has cough, effort normal Cv regular Ab soft approp tender incisions clean  Lab Results:  Recent Labs    05/03/22 0500  WBC 13.2*  HGB 14.0  HCT 42.3  PLT 141*   BMET Recent Labs    05/03/22 0500  NA 143  K 3.9  CL 111  CO2 27  GLUCOSE 116*  BUN 15  CREATININE 0.88  CALCIUM 9.0   PT/INR No results for input(s): "LABPROT", "INR" in the last 72 hours. ABG No results for input(s): "PHART", "HCO3" in the last 72 hours.  Invalid input(s): "PCO2", "PO2"  Studies/Results: DG Chest 2 View  Result Date: 05/03/2022 CLINICAL DATA:  Shortness of breath EXAM: CHEST - 2 VIEW COMPARISON:  03/18/2022 FINDINGS: hypoventilatory change with probable atelectasis at the bases. Small pleural effusions. Cardiomediastinal silhouette within normal limits. No pneumothorax. IMPRESSION: 1. Small bilateral effusions. 2. Hypoventilatory change with probable basilar atelectasis Electronically Signed   By: Donavan Foil M.D.   On: 05/03/2022 18:38    Anti-infectives: Anti-infectives (From admission, onward)    Start     Dose/Rate Route Frequency Ordered Stop   05/02/22 0600  ceFAZolin (ANCEF) IVPB 2g/100 mL premix        2 g 200 mL/hr over 30 Minutes Intravenous On call to O.R. 05/02/22 0525 05/02/22 0731       Assessment/Plan: POD 2 lap IH repair - DB -needs to be oob, ambulate -regular diet -not ready for dc yet due to above -pain control -hopefully home  am -home meds  Rolm Bookbinder 05/04/2022

## 2022-05-05 DIAGNOSIS — M359 Systemic involvement of connective tissue, unspecified: Secondary | ICD-10-CM

## 2022-05-05 DIAGNOSIS — F419 Anxiety disorder, unspecified: Secondary | ICD-10-CM

## 2022-05-05 DIAGNOSIS — R7303 Prediabetes: Secondary | ICD-10-CM

## 2022-05-05 MED ORDER — DIAZEPAM 2 MG PO TABS: 2.0000 mg | ORAL_TABLET | Freq: Four times a day (QID) | ORAL | Status: DC | PRN

## 2022-05-05 MED ORDER — DOCUSATE SODIUM 100 MG PO CAPS
100.0000 mg | ORAL_CAPSULE | Freq: Two times a day (BID) | ORAL | Status: DC
  Administered 2022-05-05 – 2022-05-06 (×3): 100 mg via ORAL
  Filled 2022-05-05 (×3): qty 1

## 2022-05-05 MED ORDER — METHOCARBAMOL 500 MG PO TABS
500.0000 mg | ORAL_TABLET | Freq: Four times a day (QID) | ORAL | Status: DC
  Administered 2022-05-05 (×4): 500 mg via ORAL
  Filled 2022-05-05 (×3): qty 1

## 2022-05-05 MED ORDER — ALBUTEROL SULFATE (2.5 MG/3ML) 0.083% IN NEBU
2.5000 mg | INHALATION_SOLUTION | Freq: Once | RESPIRATORY_TRACT | Status: AC
  Administered 2022-05-05: 2.5 mg via RESPIRATORY_TRACT
  Filled 2022-05-05: qty 3

## 2022-05-05 MED ORDER — ACETAMINOPHEN 325 MG PO TABS: 325.0000 mg | ORAL_TABLET | Freq: Four times a day (QID) | ORAL | Status: DC | PRN

## 2022-05-05 MED ORDER — POLYETHYLENE GLYCOL 3350 17 G PO PACK
34.0000 g | PACK | Freq: Once | ORAL | Status: AC
  Administered 2022-05-05: 34 g via ORAL
  Filled 2022-05-05: qty 2

## 2022-05-05 MED ORDER — ACETAMINOPHEN 650 MG RE SUPP: 650.0000 mg | Freq: Four times a day (QID) | RECTAL | Status: DC | PRN

## 2022-05-05 MED ORDER — NAPROXEN 250 MG PO TABS
500.0000 mg | ORAL_TABLET | Freq: Two times a day (BID) | ORAL | Status: DC
  Administered 2022-05-05: 500 mg via ORAL
  Filled 2022-05-05 (×3): qty 2

## 2022-05-05 MED ORDER — PANTOPRAZOLE SODIUM 40 MG PO TBEC
40.0000 mg | DELAYED_RELEASE_TABLET | Freq: Two times a day (BID) | ORAL | Status: DC
  Administered 2022-05-05 – 2022-05-06 (×2): 40 mg via ORAL
  Filled 2022-05-05 (×2): qty 1

## 2022-05-05 NOTE — Progress Notes (Signed)
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  [facility] Stay = 1 days)  Plan:  -No evidence of any hypoxia or any concerns.  Try to reassure him that mild effusions atelectasis not surprising.  He is okay on room air.  He felt better after nebulizer treatment.  It is available as needed.  We will order another 1 now and see if that helps.  -Improved pain control.  Refusing Tylenol as he is convinced it gives him heartburn and reflux despite me trying to reassure him otherwise..  We will put on standing naproxen and methocarbamol to help.  Encouraged him to use narcotic medications for breakthrough pain.  He was ice and heat.  Continue binder.  -He is terrified that he will dehisce again.  I tried to reassure him.  Incision looks okay.  Coralee Pesa is in place.  I noted is very important him to get up and mobilize and work through his soreness to control his pain so he can be active.  He actually has  been walking the hallways, so this is a guardedly hopeful sign.  We will write for very low-dose Valium as a backup muscle relaxer and perhaps for some anxiolysis.  Have physical therapy help evaluate and Occupational Therapy as well since he lives by himself and he is very anxious about going home.  Do not know if he need home health or not or at least they can help objectively reassure or offer constructive support.  Complaining of heartburn and reflux.  Placed on Protonix for now and regroup.  Blood pressure control. -VTE prophylaxis- SCDs, etc -mobilize as tolerated to help recovery  Disposition:  Disposition:  The patient is from: Home  Anticipate discharge to:  Home with Home Health  Anticipated Date of Discharge is:  August 28,2023    Barriers to discharge:  Pending Clinical improvement (more likely than not)  Patient currently is NOT MEDICALLY STABLE for discharge from the hospital from a surgery standpoint.      I reviewed nursing notes, last 24 h vitals and pain scores, last 48 h intake and output, last 24 h labs and trends, and last 24 h imaging results. I have reviewed this patient's available data, including medical history, events of note, test results, etc as part of my evaluation.  A significant portion of that time was spent in counseling.  Care during the described time interval was provided by me.  This care required moderate level of medical decision making.  05/05/2022    Subjective: (Chief complaint)  Patient tried walking hallways.  Patient scared of episodes of pain with coughing.  Worried he will dehisce again.  Nursing in room.  Tolerating solid diet.  Worried that he has not had a bowel movement.  Worried when the x-ray was not completely normal despite trying to reassure he had just mild effusions and atelectasis.  Worried that he may get short of breath again.  Liked having his inhaler given yesterday.  Objective:  Vital  signs:  Vitals:   05/04/22 0406 05/04/22 1354 05/04/22 2148 05/05/22 0402  BP: 114/66 119/69 122/70 (!) 106/54  Pulse: 72 72 70 70  Resp: '18 16 18 18  '$ Temp: 98.1 F (36.7 C) 98.3 F (36.8 C) 98.6 F (37 C) 98.2 F (36.8 C)  TempSrc: Oral Oral Oral Oral  SpO2: 95% 94% 96% 93%  Weight:      Height:           Intake/Output   Yesterday:  08/26 0701 - 08/27 0700 In: 360 [P.O.:360] Out: 0  This shift:  No intake/output data recorded.  Bowel function:  Flatus: YES  BM:  No  Drain: (No drain)   Physical Exam:  General: Pt awake/alert in no acute distress Eyes: PERRL, normal EOM.  Sclera clear.  No icterus Neuro: CN II-XII intact w/o focal sensory/motor deficits. Lymph: No head/neck/groin lymphadenopathy Psych:  No delerium/psychosis/paranoia.  Oriented x 4.  Rather anxious and interrupting often.  Ultimately directable and at the end consolable. HENT: Normocephalic, Mucus membranes moist.  No thrush Neck: Supple, No tracheal deviation.  No obvious thyromegaly Chest: No pain to chest wall compression.  Good respiratory excursion.  No audible wheezing.  No conversational dyspnea.  Lungs clear. CV:  Pulses intact.  Regular rhythm.  No major extremity edema MS: Normal AROM mjr joints.  No obvious deformity  Abdomen: Soft.  Nondistended.  Puncture site incisions clean dry intact.  No evidence of any wound breakdown or abscess.  Abdominal binder snug in place.  Mildly tender at incisions only.  No evidence of peritonitis.  No incarcerated hernias.  Ext:   No deformity.  No mjr edema.  No cyanosis Skin: No petechiae / purpurea.  No major sores.  Warm and dry    Results:   Cultures: No results found for this or any previous visit (from the past 720 hour(s)).  Labs: No results found for this or any previous visit (from the past 48 hour(s)).  Imaging / Studies: DG Chest 2 View  Result Date: 05/03/2022 CLINICAL DATA:  Shortness of breath EXAM: CHEST - 2 VIEW COMPARISON:   03/18/2022 FINDINGS: hypoventilatory change with probable atelectasis at the bases. Small pleural effusions. Cardiomediastinal silhouette within normal limits. No pneumothorax. IMPRESSION: 1. Small bilateral effusions. 2. Hypoventilatory change with probable basilar atelectasis Electronically Signed   By: Donavan Foil M.D.   On: 05/03/2022 18:38    Medications / Allergies: per chart  Antibiotics: Anti-infectives (From admission, onward)    Start     Dose/Rate Route Frequency Ordered Stop   05/02/22 0600  ceFAZolin (ANCEF) IVPB 2g/100 mL premix        2 g 200 mL/hr over 30 Minutes Intravenous On call to O.R. 05/02/22 4944 05/02/22 0731         Note: Portions of this report may have been transcribed using voice recognition software. Every effort was made to ensure accuracy; however, inadvertent computerized transcription errors may be present.   Any transcriptional errors that result from this process are unintentional.    Adin Hector, MD,  FACS, MASCRS Esophageal, Gastrointestinal & Colorectal Surgery Robotic and Minimally Invasive Surgery  [facility] [place] [address] [place], [place] [address] [phone] Fax [phone] Main  CONTACT INFORMATION:  Weekday (9AM-5PM): Call [facility] main office at [phone]  Weeknight (5PM-9AM) or Weekend/Holiday: Check [email or web address] (password " [email or web address]") for General Surgery [facility] coverage  (Please, do not use SecureChat as it is not reliable communication to reach operating surgeons for immediate patient care)      05/05/2022  10:07 AM

## 2022-05-06 MED ORDER — BISACODYL 10 MG RE SUPP
10.0000 mg | Freq: Once | RECTAL | Status: AC
Start: 2022-05-06 — End: 2022-05-06
  Administered 2022-05-06: 10 mg via RECTAL
  Filled 2022-05-06: qty 1

## 2022-05-06 NOTE — Progress Notes (Signed)
Patient was given discharge instructions, and all questions were answered.  Patient was stable for discharge and was taken to the main exit by wheelchair. 

## 2022-05-06 NOTE — Evaluation (Signed)
Physical Therapy Evaluation Patient Details Name: Samuel Lawson MRN: [id number] DOB: November 29, 1945 Today's Date: 05/06/2022  History of Present Illness  Patient is a 76 year old male who was admitted for laparoscopic incisional hernia repair with mesh. PMH: hyperlipidemia, incisional hernia.  Clinical Impression  Patient evaluated by Physical Therapy with no further acute PT needs identified. All education has been completed and the patient has no further questions.  Pt is doing well, see below for mobility. May need a RW initially, pt feels more stable and with less abd pain with use of RW.  No f/u needs  See below for any follow-up Physical Therapy or equipment needs. PT is signing off. Thank you for this referral.        Recommendations for follow up therapy are one component of a multi-disciplinary discharge planning process, led by the attending physician.  Recommendations may be updated based on patient status, additional functional criteria and insurance authorization.  Follow Up Recommendations No PT follow up      Assistance Recommended at Discharge None  Patient can return home with the following       Equipment Recommendations Rolling walker (2 wheels)  Recommendations for Other Services       Functional Status Assessment Patient has not had a recent decline in their functional status     Precautions / Restrictions Precautions Precaution Comments: hernia srugery with abdominal binder Restrictions Weight Bearing Restrictions: No      Mobility  Bed Mobility Overal bed mobility: Needs Assistance Bed Mobility: Sit to Supine, Rolling, Sidelying to Sit Rolling: Supervision Sidelying to sit: Supervision   Sit to supine: Modified independent (Device/Increase time)   General bed mobility comments: cues for log roll technique to decr pain, no physical assist; pt gets into bed knee/hip  first  then rotates to supine    Transfers Overall transfer level: Needs  assistance Equipment used: Rolling walker (2 wheels), None Transfers: Sit to/from Stand Sit to Stand: Independent                Ambulation/Gait Ambulation/Gait assistance: Modified independent (Device/Increase time), Independent   Assistive device: Rolling walker (2 wheels), None Gait Pattern/deviations: Step-through pattern, Wide base of support       General Gait Details: amb ~ 400' with RW; amb ~ 200' without device. pt with initial unsteadiness when RW removed, stability improved with distance, without overt LOB  Stairs Stairs: Yes Stairs assistance: Modified independent (Device/Increase time) Stair Management: One rail Left, Alternating pattern Number of Stairs: 8 General stair comments: pt demonstrates reciprocal pattern without LOB or instability (has ~ 2-3 steps at home )  Wheelchair Mobility    Modified Rankin (Stroke Patients Only)       Balance Overall balance assessment: Modified Independent                           High level balance activites: Side stepping, Turns, Head turns High Level Balance Comments: no LOB with above             Pertinent Vitals/Pain Pain Assessment Pain Assessment: Faces Faces Pain Scale: Hurts a little bit Pain Location: abd with bed mobility Pain Descriptors / Indicators: Grimacing, Sore Pain Intervention(s): Limited activity within patient's tolerance, Monitored during session, Repositioned    Home Living Family/patient expects to be discharged to:: Private residence Living Arrangements: Alone   Type of Home: House Home Access: Stairs to enter Entrance Stairs-Rails: Left Entrance Stairs-Number of Steps: 3-4  Home Layout: One level Home Equipment: Cane - single point Additional Comments: pt wants a cane with a seat    Prior Function Prior Level of Function : Independent/Modified Independent                     Hand Dominance   Dominant Hand: Right    Extremity/Trunk Assessment    Upper Extremity Assessment Upper Extremity Assessment: Defer to OT evaluation;Overall [facility] for tasks assessed    Lower Extremity Assessment Lower Extremity Assessment: Overall WFL for tasks assessed    Cervical / Trunk Assessment Cervical / Trunk Assessment: Normal  Communication   Communication: No difficulties  Cognition Arousal/Alertness: Awake/alert Behavior During Therapy: WFL for tasks assessed/performed Overall Cognitive Status: Within Functional Limits for tasks assessed                                          General Comments      Exercises     Assessment/Plan    PT Assessment Patient does not need any further PT services  PT Problem List         PT Treatment Interventions      PT Goals (Current goals can be found in the Care Plan section)  Acute Rehab PT Goals Patient Stated Goal: to go home PT Goal Formulation: All assessment and education complete, DC therapy    Frequency       Co-evaluation               AM-PAC PT "6 Clicks" Mobility  Outcome Measure Help needed turning from your back to your side while in a flat bed without using bedrails?: None Help needed moving from lying on your back to sitting on the side of a flat bed without using bedrails?: None Help needed moving to and from a bed to a chair (including a wheelchair)?: None Help needed standing up from a chair using your arms (e.g., wheelchair or bedside chair)?: None Help needed to walk in hospital room?: None Help needed climbing 3-5 steps with a railing? : None 6 Click Score: 24    End of Session   Activity Tolerance: Patient tolerated treatment well Patient left: in bed;with call bell/phone within reach   PT Visit Diagnosis: Other abnormalities of gait and mobility (R26.89)    Time: 5643-3295 PT Time Calculation (min) (ACUTE ONLY): 13 min   Charges:   PT Evaluation $PT Eval Low Complexity: [place], PT  Acute Rehab Dept [facility])  [phone]  [facility] Weekend Pager [facility] only)  [phone]  05/06/2022   [facility] 05/06/2022, 12:46 PM

## 2022-05-06 NOTE — Evaluation (Signed)
Occupational Therapy Evaluation Patient Details Name: Samuel Lawson MRN: [id number] DOB: 01-22-1946 Today's Date: 05/06/2022   History of Present Illness Patient is a 75 year old male who was admitted for laparoscopic incisional hernia repair with mesh. PMH: hyperlipidemia, incisional hernia.   Clinical Impression   Patient is a 76 year old male who was able to complete toileting tasks in his room with no AD MI. Patient noted to have difficulty with getting ankles to lap on R side but was not open to training on AE. Patient was educated on benefits of reacher and sock aid. Patient was educated on having family/friend support if needed at home. Patient reported he does not like to ask for help often. Patient was educated on healing process. Patient would benefit from some assistance at home as needed. OT signing off at this time as patient has reported he does not need skilled OT services.       Recommendations for follow up therapy are one component of a multi-disciplinary discharge planning process, led by the attending physician.  Recommendations may be updated based on patient status, additional functional criteria and insurance authorization.   Follow Up Recommendations  No OT follow up    Assistance Recommended at Discharge Set up Supervision/Assistance  Patient can return home with the following A little help with bathing/dressing/bathroom;Assistance with cooking/housework    Functional Status Assessment     Equipment Recommendations  Other (comment) Management consultant)    Recommendations for Other Services       Precautions / Restrictions Precautions Precaution Comments: hernia srugery wit habdominal binder      Mobility Bed Mobility Overal bed mobility: Modified Independent                  Transfers                          Balance Overall balance assessment: Modified Independent                                         ADL either  performed or assessed with clinical judgement   ADL Overall ADL's : At baseline                                       General ADL Comments: patient declining training on AE reporting he does not need it.     Vision Patient Visual Report: No change from baseline       Perception     Praxis      Pertinent Vitals/Pain       Hand Dominance Right   Extremity/Trunk Assessment Upper Extremity Assessment Upper Extremity Assessment: Overall WFL for tasks assessed   Lower Extremity Assessment Lower Extremity Assessment: Defer to PT evaluation   Cervical / Trunk Assessment Cervical / Trunk Assessment: Normal   Communication Communication Communication: No difficulties   Cognition Arousal/Alertness: Awake/alert Behavior During Therapy: WFL for tasks assessed/performed Overall Cognitive Status: Within Functional Limits for tasks assessed                                       General Comments       Exercises     Shoulder Instructions  Home Living Family/patient expects to be discharged to:: Private residence Living Arrangements: Alone   Type of Home: House Home Access: Stairs to enter Technical brewer of Steps: 3-4 Entrance Stairs-Rails: Left Home Layout: One level     Bathroom Shower/Tub: Tub/shower unit         Home Equipment: Cane - single point          Prior Functioning/Environment Prior Level of Function : Independent/Modified Independent                        OT Problem List: Decreased activity tolerance;Decreased safety awareness;Decreased knowledge of use of DME or AE;Pain      OT Treatment/Interventions:      OT Goals(Current goals can be found in the care plan section) Acute Rehab OT Goals OT Goal Formulation: All assessment and education complete, DC therapy  OT Frequency:      Co-evaluation              AM-PAC OT "6 Clicks" Daily Activity     Outcome Measure Help from  another person eating meals?: None Help from another person taking care of personal grooming?: None Help from another person toileting, which includes using toliet, bedpan, or urinal?: None Help from another person bathing (including washing, rinsing, drying)?: A Little Help from another person to put on and taking off regular upper body clothing?: None Help from another person to put on and taking off regular lower body clothing?: A Little 6 Click Score: 22   End of Session Equipment Utilized During Treatment: Rolling walker (2 wheels);Other (comment) (reacher sock aid) Nurse Communication: Mobility status  Activity Tolerance: Patient tolerated treatment well Patient left: in bed;with call bell/phone within reach  OT Visit Diagnosis: Other abnormalities of gait and mobility (R26.89);Unsteadiness on feet (R26.81);Pain                Time: 3343-5686 OT Time Calculation (min): 20 min Charges:  OT General Charges $OT Visit: 1 Visit OT Evaluation $OT Eval Moderate Complexity: 1 Mod  Aleshka Corney OTR/L, MS Acute Rehabilitation Department Office# [phone]   Marcellina Millin 05/06/2022, 12:28 PM

## 2022-05-06 NOTE — Discharge Summary (Signed)
Physician Discharge Summary  Patient ID: Samuel Lawson MRN: [id number] DOB/AGE: [birth year] 76 y.o.  Admit date: 05/02/2022 Discharge date: 05/06/2022  Admission Diagnoses:  Discharge Diagnoses:  Principal Problem:   Incisional hernia Active Problems:   Coronary artery disease involving native coronary artery of native heart without angina pectoris   Obesity (BMI 30-39.9)   Gastroesophageal reflux disease without esophagitis   Essential hypertension   Vertigo   Renal cell cancer, left, 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   Anxiety   Prediabetes   Collagen vascular disease [facility])   Discharged Condition: good  Hospital Course: The patient was admitted postoperative after a laparoscopic incisional hernia repair with mesh.  He took several days from a pain standpoint to improve.  By postoperative day 4, he had minimal pain.  His abdominal exam was completely benign.  His incisions were healing well.  He was tolerating a diet so decision was made to discharge patient home  Consults: None  Significant Diagnostic Studies:   Treatments: surgery: Laparoscopic incisional hernia repair with mesh  Discharge Exam: Blood pressure 123/61, pulse (!) 53, temperature 98.1 F (36.7 C), temperature source Oral, resp. rate 16, height '5\' 7"'$  (1.702 m), weight 85.5 kg, SpO2 93 %. General appearance: alert, cooperative, and no distress Resp: clear to auscultation bilaterally Cardio: regular rate and rhythm, S1, S2 normal, no murmur, click, rub or gallop Incision/Wound: Abdomen is soft and nondistended.  There is minimal tenderness.  His incisions are healing well  Disposition: Discharge disposition: 01-Home or Self Care        Allergies as of 05/06/2022   No Known Allergies      Medication List     TAKE these medications    Accu-Chek Guide Control Liqd 1 each by In Vitro route once as needed for up to 1 dose. R73.03   Accu-Chek Guide test strip Generic drug: glucose  blood Use as instructed. Check blood glucose by fingerstick once per day. R73.03   aspirin EC 81 MG tablet Take 81 mg by mouth at bedtime. Swallow whole.   atorvastatin 40 MG tablet Commonly known as: LIPITOR TAKE 1 TABLET (40 MG TOTAL) BY MOUTH AT BEDTIME. What changed: how much to take   carvedilol 3.125 MG tablet Commonly known as: COREG Take 1 tablet (3.125 mg total) by mouth 2 (two) times daily with a meal.   docusate sodium 100 MG capsule Commonly known as: COLACE Take 1 capsule (100 mg total) by mouth every 12 (twelve) hours.   fluticasone 50 MCG/ACT nasal spray Commonly known as: FLONASE Place 2 sprays into both nostrils daily. What changed:  when to take this reasons to take this   multivitamin with minerals tablet Take 1 tablet by mouth daily.   omega-3 acid ethyl esters 1 g capsule Commonly known as: LOVAZA TAKE 1 CAPSULE (1 G TOTAL) BY MOUTH 2 (TWO) TIMES DAILY. What changed:  how much to take how to take this when to take this   ondansetron 8 MG disintegrating tablet Commonly known as: ZOFRAN-ODT Take 8 mg by mouth every 8 (eight) hours as needed.   oxyCODONE 5 MG immediate release tablet Commonly known as: Oxy IR/ROXICODONE Take 1 tablet (5 mg total) by mouth every 6 (six) hours as needed for moderate pain or severe pain.   tamsulosin 0.4 MG Caps capsule Commonly known as: FLOMAX Take 2 capsules by mouth once daily. (Take 2 capsules by mouth once daily.) What changed:  how much to take when to take this  Vitamin D3 125 MCG (5000 UT) Caps Take 5,000 Units by mouth daily. When compliant               Durable Medical Equipment  (From admission, onward)           Start     Ordered   05/06/22 1117  For home use only DME Walker rolling  Once       Question Answer Comment  Walker: With [place]   Patient needs a walker to treat with the following condition Difficulty in walking, not elsewhere classified      05/06/22 1117             Follow-up Information     Coralie Keens, MD. Schedule an appointment as soon as possible for a visit in 4 week(s).   Specialty: General Surgery Contact information: [place] [address] [phone]                 Signed: Coralie Keens 05/06/2022, 2:56 PM

## 2022-05-07 ENCOUNTER — Telehealth: Payer: Self-pay | Admitting: *Deleted

## 2022-05-07 NOTE — Patient Outreach (Signed)
  Care Coordination [facility] Note Transition Care Management Follow-up Telephone Call Date of discharge and from where: 05/06/22 [facility] How have you been since you were released from the hospital? Patient states he is doing well Any questions or concerns? No  Items Reviewed: Did the pt receive and understand the discharge instructions provided? Yes  Medications obtained and verified? Yes  Other? No  Any new allergies since your discharge? No  Dietary orders reviewed? Yes Do you have support at home? Yes   Home Care and Equipment/Supplies: Were home health services ordered? no If so, what is the name of the agency? N/A  Has the agency set up a time to come to the patient's home? not applicable Were any new equipment or medical supplies ordered?  No What is the name of the medical supply agency? N/A Were you able to get the supplies/equipment? not applicable Do you have any questions related to the use of the equipment or supplies? No  Functional Questionnaire: (I = Independent and D = Dependent) ADLs: I  Bathing/Dressing- I  Meal Prep- I  Eating- I  Maintaining continence- I  Transferring/Ambulation- I  Managing Meds- I  Follow up appointments reviewed:  PCP Hospital f/u appt confirmed? No   Specialist Hospital f/u appt confirmed? Yes , Patient is waiting for a callback from Dr. Trevor Mace office with an appointment date. Are transportation arrangements needed? No  If their condition worsens, is the pt aware to call PCP or go to the Emergency Dept.? Yes Was the patient provided with contact information for the PCP's office or ED? Yes Was to pt encouraged to call back with questions or concerns? Yes  SDOH assessments and interventions completed:   No  Care Coordination Interventions Activated:  No   Care Coordination Interventions:   N/A     Encounter Outcome:  Pt. Visit Completed    Emelia Loron RN, BSN [place] [phone] [place] [email or web address]

## 2022-05-10 ENCOUNTER — Other Ambulatory Visit: Payer: Self-pay

## 2022-06-04 DIAGNOSIS — K432 Incisional hernia without obstruction or gangrene: Secondary | ICD-10-CM | POA: Diagnosis not present

## 2022-06-07 ENCOUNTER — Other Ambulatory Visit: Payer: Self-pay

## 2022-06-19 ENCOUNTER — Ambulatory Visit: Payer: Medicare HMO | Admitting: Nurse Practitioner

## 2022-06-27 ENCOUNTER — Encounter: Payer: Medicare HMO | Admitting: Gastroenterology

## 2022-07-03 ENCOUNTER — Other Ambulatory Visit: Payer: Self-pay

## 2022-07-25 ENCOUNTER — Other Ambulatory Visit: Payer: Self-pay | Admitting: Family Medicine

## 2022-07-25 ENCOUNTER — Other Ambulatory Visit: Payer: Self-pay

## 2022-07-25 DIAGNOSIS — I2510 Atherosclerotic heart disease of native coronary artery without angina pectoris: Secondary | ICD-10-CM

## 2022-07-25 DIAGNOSIS — E785 Hyperlipidemia, unspecified: Secondary | ICD-10-CM

## 2022-07-25 DIAGNOSIS — J329 Chronic sinusitis, unspecified: Secondary | ICD-10-CM

## 2022-07-25 NOTE — Telephone Encounter (Signed)
Unable to refill per protocol, Rx request are too soon. Last refill 03/07/22 for 90 and 1 rf. Should have enough until end of December. Will refuse.  Requested Prescriptions  Pending Prescriptions Disp Refills   atorvastatin (LIPITOR) 40 MG tablet 90 tablet 1    Sig: TAKE 1 TABLET (40 MG TOTAL) BY MOUTH AT BEDTIME.     Cardiovascular:  Antilipid - Statins Failed - 07/25/2022  9:48 AM      Failed - Lipid Panel in normal range within the last 12 months    Cholesterol, Total  Date Value Ref Range Status  03/07/2022 146 100 - 199 mg/dL Final   LDL Chol Calc (NIH)  Date Value Ref Range Status  03/07/2022 76 0 - 99 mg/dL Final   HDL  Date Value Ref Range Status  03/07/2022 53 >39 mg/dL Final   Triglycerides  Date Value Ref Range Status  03/07/2022 90 0 - 149 mg/dL Final         Passed - Patient is not pregnant      Passed - Valid encounter within last 12 months    Recent Outpatient Visits           4 months ago Other tobacco product nicotine dependence, uncomplicated   [place], [place], MD   8 months ago Upper respiratory tract infection, unspecified type   [facility], Kriste Basque, NP   11 months ago Prediabetes   [place], [place] W, NP   1 year ago [place], [place] W, NP   1 year ago Prediabetes   [place], Vernia Buff, NP       Future Appointments             In 5 days Gildardo Pounds, NP [place]             carvedilol (COREG) 3.125 MG tablet 180 tablet 1    Sig: Take 1 tablet (3.125 mg total) by mouth 2 (two) times daily with a meal.     Cardiovascular: Beta Blockers 3 Passed - 07/25/2022  9:48 AM      Passed - Cr in normal range and within 360 days    Creat  Date Value Ref Range Status  11/15/2016 0.96 0.70 - 1.25 mg/dL Final     Comment:      For patients > or = 75 years of age: The upper reference limit for Creatinine is approximately 13% higher for people identified as African-American.      Creatinine, Ser  Date Value Ref Range Status  05/03/2022 0.88 0.61 - 1.24 mg/dL Final         Passed - AST in normal range and within 360 days    AST  Date Value Ref Range Status  04/20/2022 21 15 - 41 U/L Final         Passed - ALT in normal range and within 360 days    ALT  Date Value Ref Range Status  04/20/2022 13 0 - 44 U/L Final         Passed - Last BP in normal range    BP Readings from Last 1 Encounters:  05/06/22 123/61         Passed - Last Heart Rate in normal range    Pulse Readings from Last 1 Encounters:  05/06/22 (!) 50         Passed - Valid encounter within last 6 months    Recent Outpatient Visits           4 months ago Other tobacco product nicotine dependence, uncomplicated   [place], [place], MD   8 months ago Upper respiratory tract infection, unspecified type   [facility] at [facility], Kriste Basque, NP   11 months ago [place], Vernia Buff, NP   1 year ago Prediabetes   [place], Vernia Buff, NP   1 year ago Prediabetes   [place], Vernia Buff, NP       Future Appointments             In 5 days Gildardo Pounds, NP [place]             fluticasone (FLONASE) 50 MCG/ACT nasal spray 16 g 1    Sig: Place 2 sprays into both nostrils daily.     Ear, Nose, and Throat: Nasal Preparations - Corticosteroids Passed - 07/25/2022  9:48 AM      Passed - Valid encounter within last 12 months    Recent Outpatient Visits           4 months ago Other tobacco product nicotine dependence, uncomplicated   [place], Charlane Ferretti, MD   8  months ago Upper respiratory tract infection, unspecified type   [facility] at [facility], Kriste Basque, NP   11 months ago [place], Vernia Buff, NP   1 year ago Prediabetes   [place], Vernia Buff, NP   1 year ago Prediabetes   [place], Vernia Buff, NP       Future Appointments             In 5 days Gildardo Pounds, NP [place]             omega-3 acid ethyl esters (LOVAZA) 1 g capsule 180 capsule 1    Sig: TAKE 1 CAPSULE (1 G TOTAL) BY MOUTH 2 (TWO) TIMES DAILY.     Endocrinology:  Nutritional Agents - omega-3 acid ethyl esters Failed - 07/25/2022  9:48 AM      Failed - Lipid Panel in normal range within the last 12 months    Cholesterol, Total  Date Value Ref Range Status  03/07/2022 146 100 - 199 mg/dL Final   LDL Chol Calc (NIH)  Date Value Ref Range Status  03/07/2022 76 0 - 99 mg/dL Final   HDL  Date Value Ref Range Status  03/07/2022 53 >39 mg/dL Final   Triglycerides  Date Value Ref Range Status  03/07/2022 90 0 - 149 mg/dL Final         Passed - Valid encounter within last 12 months    Recent Outpatient Visits           4 months ago Other tobacco product nicotine dependence, uncomplicated   [place], Charlane Ferretti, MD   8 months ago Upper respiratory tract infection, unspecified type   [facility] at [facility], Kriste Basque, NP   11 months ago Prediabetes  [place], Vernia Buff, NP   1 year ago Prediabetes   [place], Vernia Buff, NP   1 year ago Prediabetes   [place], Vernia Buff, NP       Future Appointments             In 5 days Gildardo Pounds, NP [place]

## 2022-07-26 ENCOUNTER — Other Ambulatory Visit: Payer: Self-pay

## 2022-07-30 ENCOUNTER — Encounter: Payer: Self-pay | Admitting: Nurse Practitioner

## 2022-07-30 ENCOUNTER — Other Ambulatory Visit: Payer: Self-pay

## 2022-07-30 ENCOUNTER — Ambulatory Visit: Payer: Medicare HMO | Attending: Nurse Practitioner | Admitting: Nurse Practitioner

## 2022-07-30 VITALS — BP 115/71 | HR 64 | Ht 67.0 in | Wt 184.0 lb

## 2022-07-30 DIAGNOSIS — F17290 Nicotine dependence, other tobacco product, uncomplicated: Secondary | ICD-10-CM

## 2022-07-30 DIAGNOSIS — R413 Other amnesia: Secondary | ICD-10-CM | POA: Diagnosis not present

## 2022-07-30 DIAGNOSIS — M62838 Other muscle spasm: Secondary | ICD-10-CM | POA: Diagnosis not present

## 2022-07-30 DIAGNOSIS — Z1211 Encounter for screening for malignant neoplasm of colon: Secondary | ICD-10-CM

## 2022-07-30 DIAGNOSIS — K5909 Other constipation: Secondary | ICD-10-CM | POA: Diagnosis not present

## 2022-07-30 MED ORDER — SENNOSIDES-DOCUSATE SODIUM 8.6-50 MG PO TABS
1.0000 | ORAL_TABLET | Freq: Every day | ORAL | 1 refills | Status: DC
  Filled 2022-07-30: qty 100, 100d supply, fill #0
  Filled 2022-12-12: qty 100, 100d supply, fill #1

## 2022-07-30 NOTE — Progress Notes (Signed)
Assessment & Plan:  Samuel Lawson was seen today for leg pain.  Diagnoses and all orders for this visit:  Nocturnal muscle spasm Declines muscle relaxant   Colon cancer screening -     Cologuard  Memory loss -     Ambulatory referral to Neurology  Chronic constipation -     senna-docusate (SENOKOT-S) 8.6-50 MG tablet; Take 1 tablet by mouth daily. For constipation  Other tobacco product nicotine dependence, uncomplicated -     CT CHEST LUNG CA SCREEN LOW DOSE W/O CM; Future    Patient has been counseled on age-appropriate routine health concerns for screening and prevention. These are reviewed and up-to-date. Referrals have been placed accordingly. Immunizations are up-to-date or declined.    Subjective:   Chief Complaint  Patient presents with   Leg Pain   Leg Pain  The incident occurred more than 1 week ago. The incident occurred at home. There was no injury mechanism. The pain is present in the right thigh. The quality of the pain is described as cramping, stabbing and shooting. The pain is at a severity of 8/10. The pain is moderate. The pain has been Intermittent since onset. Pertinent negatives include no inability to bear weight, loss of motion, loss of sensation, muscle weakness, numbness or tingling. He reports no foreign bodies present. Nothing aggravates the symptoms. He has tried nothing for the symptoms.   Samuel Lawson 76 y.o. male presents to office today with complaints of nocturnal muscle spasms of the right thigh and lower leg.   He has a past medical history of Acute MI (2009), Collagen vascular disease, Hyperlipidemia, Hypertension, and Prediabetes   He endorses intermittent right LE muscle spasms occurring several times per week. Cramping lasts for several minutes and then goes away on its own. Pain is only present at night during sleep hours.   Short term memory loss Worried that he may have alzheimer's like his father did. He was evaluated by [facility] Dr. Krista Lawson  in April. MRI negative. Work up was negative and recommendations were:  Marland Kitchen He should stop smoking He continues to smoke 2.  Be physically active 3.  Increase water intake 4.  Eat healthy 5.  Catch enough sleep (at least 7 hours or longer) Asked to see if he has excessive snoring, daytime sleepiness, fatigue, if he does, may consider sleep referral He would like a second opinion    01/02/2022   11:25 AM  Montreal Cognitive Assessment   Visuospatial/ Executive (0/5) 5  Naming (0/3) 2  Attention: Read list of digits (0/2) 1  Attention: Read list of letters (0/1) 1  Attention: Serial 7 subtraction starting at 100 (0/3) 3  Language: Repeat phrase (0/2) 1  Language : Fluency (0/1) 0  Abstraction (0/2) 2  Delayed Recall (0/5) 1  Orientation (0/6) 6  Total 22  Adjusted Score (based on education) 22     Review of Systems  Constitutional:  Negative for fever, malaise/fatigue and weight loss.  HENT: Negative.  Negative for nosebleeds.   Eyes: Negative.  Negative for blurred vision, double vision and photophobia.  Respiratory: Negative.  Negative for cough and shortness of breath.   Cardiovascular: Negative.  Negative for chest pain, palpitations and leg swelling.  Gastrointestinal: Negative.  Negative for heartburn, nausea and vomiting.  Musculoskeletal: Negative.  Negative for myalgias.  Neurological: Negative.  Negative for dizziness, tingling, focal weakness, seizures, numbness and headaches.  Psychiatric/Behavioral:  Positive for memory loss. Negative for suicidal ideas.  Past Medical History:  Diagnosis Date   Acute MI [facility]) 2009   STENTS PLACED   Chronic kidney disease    Collagen vascular disease ([place])    Hyperlipidemia    Hypertension    Pre-diabetes    Prediabetes     Past Surgical History:  Procedure Laterality Date   ANGIOPLASTY     CARDIAC CATHETERIZATION     INCISIONAL HERNIA REPAIR N/A 05/02/2022   Procedure: LAPAROSCOPIC INCISIONAL HERNIA REPAIR WITH MESH;   Surgeon: Samuel Keens, MD;  Location: [facility];  Service: General;  Laterality: N/A;   ROBOTIC ASSITED PARTIAL NEPHRECTOMY Left 12/05/2021   Procedure: XI ROBOTIC ASSITED PARTIAL NEPHRECTOMY;  Surgeon: Samuel Hughs, MD;  Location: [facility];  Service: Urology;  Laterality: Left;  3.5 HRS    Family History  Problem Relation Age of Onset   Stroke Father    Alzheimer's disease Father    High blood pressure Father    Heart Problems Mother     Social History Reviewed with no changes to be made today.   Outpatient Medications Prior to Visit  Medication Sig Dispense Refill   aspirin EC 81 MG tablet Take 81 mg by mouth at bedtime. Swallow whole.     atorvastatin (LIPITOR) 40 MG tablet TAKE 1 TABLET (40 MG TOTAL) BY MOUTH AT BEDTIME. (Patient taking differently: Take 40 mg by mouth at bedtime.) 90 tablet 1   carvedilol (COREG) 3.125 MG tablet Take 1 tablet (3.125 mg total) by mouth 2 (two) times daily with a meal. 180 tablet 1   Cholecalciferol (VITAMIN D3) 125 MCG (5000 UT) CAPS Take 5,000 Units by mouth daily. When compliant     fluticasone (FLONASE) 50 MCG/ACT nasal spray Place 2 sprays into both nostrils daily. (Patient taking differently: Place 2 sprays into both nostrils as needed for allergies.) 16 g 1   glucose blood (ACCU-CHEK GUIDE) test strip Use as instructed. Check blood glucose by fingerstick once per day. R73.03 100 each 0   Multiple Vitamins-Minerals (MULTIVITAMIN WITH MINERALS) tablet Take 1 tablet by mouth daily.     omega-3 acid ethyl esters (LOVAZA) 1 g capsule TAKE 1 CAPSULE (1 G TOTAL) BY MOUTH 2 (TWO) TIMES DAILY. (Patient taking differently: Take 1 g by mouth 2 (two) times daily.) 180 capsule 1   ondansetron (ZOFRAN-ODT) 8 MG disintegrating tablet Take 8 mg by mouth every 8 (eight) hours as needed.     oxyCODONE (OXY IR/ROXICODONE) 5 MG immediate release tablet Take 1 tablet (5 mg total) by mouth every 6 (six) hours as needed for moderate pain or severe pain. 25 tablet  0   tamsulosin (FLOMAX) 0.4 MG CAPS capsule Take 2 capsules by mouth once daily. (Patient taking differently: Take 0.4 mg by mouth daily.) 60 capsule 11   docusate sodium (COLACE) 100 MG capsule Take 1 capsule (100 mg total) by mouth every 12 (twelve) hours. 60 capsule 0   Blood Glucose Calibration (ACCU-CHEK GUIDE CONTROL) LIQD 1 each by In Vitro route once as needed for up to 1 dose. R73.03 (Patient not taking: Reported on 07/30/2022) 1 each 0   No facility-administered medications prior to visit.    No Known Allergies     Objective:    BP 115/71   Pulse 64   Ht '5\' 7"'$  (1.702 m)   Wt 184 lb (83.5 kg)   SpO2 100%   BMI 28.82 kg/m  Wt Readings from Last 3 Encounters:  07/30/22 184 lb (83.5 kg)  05/02/22 188 lb 7.9  oz (85.5 kg)  04/10/22 188 lb 7.9 oz (85.5 kg)    Physical Exam Vitals and nursing note reviewed.  Constitutional:      Appearance: He is well-developed.  HENT:     Head: Normocephalic and atraumatic.  Cardiovascular:     Rate and Rhythm: Normal rate and regular rhythm.     Heart sounds: Normal heart sounds. No murmur heard.    No friction rub. No gallop.  Pulmonary:     Effort: Pulmonary effort is normal. No tachypnea or respiratory distress.     Breath sounds: Normal breath sounds. No decreased breath sounds, wheezing, rhonchi or rales.  Chest:     Chest wall: No tenderness.  Abdominal:     General: Bowel sounds are normal.     Palpations: Abdomen is soft.  Musculoskeletal:        General: Normal range of motion.     Cervical back: Normal range of motion.  Skin:    General: Skin is warm and dry.  Neurological:     Mental Status: He is alert and oriented to person, place, and time.     Coordination: Coordination normal.  Psychiatric:        Behavior: Behavior normal. Behavior is cooperative.        Thought Content: Thought content normal.        Judgment: Judgment normal.          Patient has been counseled extensively about nutrition and  exercise as well as the importance of adherence with medications and regular follow-up. The patient was given clear instructions to go to ER or return to medical center if symptoms don't improve, worsen or new problems develop. The patient verbalized understanding.   Follow-up: Return in about 3 months (around 10/30/2022) for HTN.   Gildardo Pounds, FNP-BC [facility], [place]   07/30/2022, 3:51 PM

## 2022-08-05 ENCOUNTER — Other Ambulatory Visit: Payer: Self-pay

## 2022-08-19 DIAGNOSIS — Z1211 Encounter for screening for malignant neoplasm of colon: Secondary | ICD-10-CM | POA: Diagnosis not present

## 2022-08-26 ENCOUNTER — Other Ambulatory Visit (INDEPENDENT_AMBULATORY_CARE_PROVIDER_SITE_OTHER): Payer: Self-pay | Admitting: Primary Care

## 2022-08-26 DIAGNOSIS — K921 Melena: Secondary | ICD-10-CM

## 2022-08-26 LAB — COLOGUARD: COLOGUARD: POSITIVE — AB

## 2022-08-27 NOTE — Progress Notes (Signed)
Spoke with patient re: COLOGUARD is Positive  with indicates test is Abnormal  and  referring to GI. Patient advised on the importance of follow-up with GI MD . Informed that GI office will be calling to arrange for appointment. Patient verbalized understanding of all discussed.

## 2022-08-28 ENCOUNTER — Other Ambulatory Visit: Payer: Self-pay

## 2022-08-28 ENCOUNTER — Other Ambulatory Visit: Payer: Self-pay | Admitting: Family Medicine

## 2022-08-28 DIAGNOSIS — I2510 Atherosclerotic heart disease of native coronary artery without angina pectoris: Secondary | ICD-10-CM

## 2022-08-28 MED ORDER — CARVEDILOL 3.125 MG PO TABS
3.1250 mg | ORAL_TABLET | Freq: Two times a day (BID) | ORAL | 1 refills | Status: DC
  Filled 2022-08-28: qty 180, 90d supply, fill #0

## 2022-08-29 ENCOUNTER — Other Ambulatory Visit: Payer: Self-pay

## 2022-08-30 ENCOUNTER — Other Ambulatory Visit: Payer: Self-pay

## 2022-08-30 ENCOUNTER — Ambulatory Visit: Payer: Medicare HMO | Attending: Nurse Practitioner

## 2022-08-30 DIAGNOSIS — Z0000 Encounter for general adult medical examination without abnormal findings: Secondary | ICD-10-CM | POA: Diagnosis not present

## 2022-08-30 DIAGNOSIS — K4090 Unilateral inguinal hernia, without obstruction or gangrene, not specified as recurrent: Secondary | ICD-10-CM | POA: Diagnosis not present

## 2022-08-30 NOTE — Patient Instructions (Addendum)
Please reach out to the GI doctor to schedule your Colonoscopy Samuel Lawson , Thank you for taking time to come for your Medicare Wellness Visit. I appreciate your ongoing commitment to your health goals. Please review the following plan we discussed and let me know if I can assist you in the future.   These are the goals we discussed:  Goals      Quit Smoking        This is a list of the screening recommended for you and due dates:  Health Maintenance  Topic Date Due   COVID-19 Vaccine (2 - Pfizer risk series) 10/12/2020   Stool Blood Test  01/12/2021   Zoster (Shingles) Vaccine (1 of 2) 10/30/2022*   Flu Shot  12/08/2022*   Pneumonia Vaccine (1 - PCV) 07/31/2023*   Medicare Annual Wellness Visit  08/31/2023   Hepatitis C Screening: USPSTF Recommendation to screen - Ages 18-79 yo.  Completed   HPV Vaccine  Aged Out   DTaP/Tdap/Td vaccine  Discontinued   Colon Cancer Screening  Discontinued  *Topic was postponed. The date shown is not the original due date.  Health Maintenance After Age 19 After age 7, you are at a higher risk for certain long-term diseases and infections as well as injuries from falls. Falls are a major cause of broken bones and head injuries in people who are older than age 66. Getting regular preventive care can help to keep you healthy and well. Preventive care includes getting regular testing and making lifestyle changes as recommended by your health care provider. Talk with your health care provider about: Which screenings and tests you should have. A screening is a test that checks for a disease when you have no symptoms. A diet and exercise plan that is right for you. What should I know about screenings and tests to prevent falls? Screening and testing are the best ways to find a health problem early. Early diagnosis and treatment give you the best chance of managing medical conditions that are common after age 59. Certain conditions and lifestyle choices may  make you more likely to have a fall. Your health care provider may recommend: Regular vision checks. Poor vision and conditions such as cataracts can make you more likely to have a fall. If you wear glasses, make sure to get your prescription updated if your vision changes. Medicine review. Work with your health care provider to regularly review all of the medicines you are taking, including over-the-counter medicines. Ask your health care provider about any side effects that may make you more likely to have a fall. Tell your health care provider if any medicines that you take make you feel dizzy or sleepy. Strength and balance checks. Your health care provider may recommend certain tests to check your strength and balance while standing, walking, or changing positions. Foot health exam. Foot pain and numbness, as well as not wearing proper footwear, can make you more likely to have a fall. Screenings, including: Osteoporosis screening. Osteoporosis is a condition that causes the bones to get weaker and break more easily. Blood pressure screening. Blood pressure changes and medicines to control blood pressure can make you feel dizzy. Depression screening. You may be more likely to have a fall if you have a fear of falling, feel depressed, or feel unable to do activities that you used to do. Alcohol use screening. Using too much alcohol can affect your balance and may make you more likely to have a fall. Follow these  instructions at home: Lifestyle Do not drink alcohol if: Your health care provider tells you not to drink. If you drink alcohol: Limit how much you have to: 0-1 drink a day for women. 0-2 drinks a day for men. Know how much alcohol is in your drink. In the [place] one drink equals one 12 oz bottle of beer (355 mL), one 5 oz glass of wine (148 mL), or one 1 oz glass of hard liquor (44 mL). Do not use any products that contain nicotine or tobacco. These products include cigarettes,  chewing tobacco, and vaping devices, such as e-cigarettes. If you need help quitting, ask your health care provider. Activity  Follow a regular exercise program to stay fit. This will help you maintain your balance. Ask your health care provider what types of exercise are appropriate for you. If you need a cane or walker, use it as recommended by your health care provider. Wear supportive shoes that have nonskid soles. Safety  Remove any tripping hazards, such as rugs, cords, and clutter. Install safety equipment such as grab bars in bathrooms and safety rails on stairs. Keep rooms and walkways well-lit. General instructions Talk with your health care provider about your risks for falling. Tell your health care provider if: You fall. Be sure to tell your health care provider about all falls, even ones that seem minor. You feel dizzy, tiredness (fatigue), or off-balance. Take over-the-counter and prescription medicines only as told by your health care provider. These include supplements. Eat a healthy diet and maintain a healthy weight. A healthy diet includes low-fat dairy products, low-fat (lean) meats, and fiber from whole grains, beans, and lots of fruits and vegetables. Stay current with your vaccines. Schedule regular health, dental, and eye exams. Summary Having a healthy lifestyle and getting preventive care can help to protect your health and wellness after age 30. Screening and testing are the best way to find a health problem early and help you avoid having a fall. Early diagnosis and treatment give you the best chance for managing medical conditions that are more common for people who are older than age 62. Falls are a major cause of broken bones and head injuries in people who are older than age 37. Take precautions to prevent a fall at home. Work with your health care provider to learn what changes you can make to improve your health and wellness and to prevent falls. This  information is not intended to replace advice given to you by your health care provider. Make sure you discuss any questions you have with your health care provider. Document Revised: 01/15/2021 Document Reviewed: 01/15/2021 Elsevier Patient Education  [place].

## 2022-08-30 NOTE — Progress Notes (Signed)
Subjective:   Samuel Lawson is a 76 y.o. male who presents for Medicare Annual/Subsequent preventive examination.  Review of Systems     I connected with Samuel Lawson on 08/30/2022 at 9:07 am by telephone and verified that I am speaking with the correct person using two identifiers. I discussed the limitations, risks, security and privacy concerns of performing an evaluation and management service by telephone and the availability of in person appointments. I also discussed with the patient that there may be a patient responsible charge related to this service. The patient expressed understanding and agreed to proceed.   Patient location: Home My Location: [place] on the telephone call: Myself and Patinet     Cardiac Risk Factors include: none     Objective:    There were no vitals filed for this visit. There is no height or weight on file to calculate BMI.     08/30/2022    9:10 AM 05/02/2022    1:00 PM 04/29/2022    9:07 AM 04/10/2022    6:18 PM 12/05/2021    5:21 PM 12/05/2021    2:00 PM 11/22/2021    9:44 AM  Advanced Directives  Does Patient Have a Medical Advance Directive? No No No No  No No  Would patient like information on creating a medical advance directive? Yes (ED - Information included in AVS) No - Patient declined   No - Patient declined      Current Medications (verified) Outpatient Encounter Medications as of 08/30/2022  Medication Sig   aspirin EC 81 MG tablet Take 81 mg by mouth at bedtime. Swallow whole.   atorvastatin (LIPITOR) 40 MG tablet TAKE 1 TABLET (40 MG TOTAL) BY MOUTH AT BEDTIME. (Patient taking differently: Take 40 mg by mouth at bedtime.)   carvedilol (COREG) 3.125 MG tablet Take 1 tablet (3.125 mg total) by mouth 2 (two) times daily with a meal.   Cholecalciferol (VITAMIN D3) 125 MCG (5000 UT) CAPS Take 5,000 Units by mouth daily. When compliant   fluticasone (FLONASE) 50 MCG/ACT nasal spray Place 2 sprays into  both nostrils daily. (Patient taking differently: Place 2 sprays into both nostrils as needed for allergies.)   Multiple Vitamins-Minerals (MULTIVITAMIN WITH MINERALS) tablet Take 1 tablet by mouth daily.   omega-3 acid ethyl esters (LOVAZA) 1 g capsule TAKE 1 CAPSULE (1 G TOTAL) BY MOUTH 2 (TWO) TIMES DAILY. (Patient taking differently: Take 1 g by mouth 2 (two) times daily.)   ondansetron (ZOFRAN-ODT) 8 MG disintegrating tablet Take 8 mg by mouth every 8 (eight) hours as needed.   senna-docusate (SENOKOT-S) 8.6-50 MG tablet Take 1 tablet by mouth daily. For constipation   tamsulosin (FLOMAX) 0.4 MG CAPS capsule Take 2 capsules by mouth once daily. (Patient taking differently: Take 0.4 mg by mouth daily.)   Blood Glucose Calibration (ACCU-CHEK GUIDE CONTROL) LIQD 1 each by In Vitro route once as needed for up to 1 dose. R73.03 (Patient not taking: Reported on 07/30/2022)   glucose blood (ACCU-CHEK GUIDE) test strip Use as instructed. Check blood glucose by fingerstick once per day. R73.03 (Patient not taking: Reported on 08/30/2022)   oxyCODONE (OXY IR/ROXICODONE) 5 MG immediate release tablet Take 1 tablet (5 mg total) by mouth every 6 (six) hours as needed for moderate pain or severe pain. (Patient not taking: Reported on 08/30/2022)   [DISCONTINUED] promethazine (PHENERGAN) 25 MG tablet Take 1 tablet (25 mg total) by mouth every 6 (six) hours as needed for nausea.  No facility-administered encounter medications on file as of 08/30/2022.    Allergies (verified) Patient has no known allergies.   History: Past Medical History:  Diagnosis Date   Acute MI ([place]) 2009   STENTS PLACED   Chronic kidney disease    Collagen vascular disease ([place])    Hyperlipidemia    Hypertension    Pre-diabetes    Prediabetes    Past Surgical History:  Procedure Laterality Date   ANGIOPLASTY     CARDIAC CATHETERIZATION     INCISIONAL HERNIA REPAIR N/A 05/02/2022   Procedure: LAPAROSCOPIC INCISIONAL HERNIA  REPAIR WITH MESH;  Surgeon: Coralie Keens, MD;  Location: [facility];  Service: General;  Laterality: N/A;   ROBOTIC ASSITED PARTIAL NEPHRECTOMY Left 12/05/2021   Procedure: XI ROBOTIC ASSITED PARTIAL NEPHRECTOMY;  Surgeon: Ardis Hughs, MD;  Location: [facility];  Service: Urology;  Laterality: Left;  3.5 HRS   Family History  Problem Relation Age of Onset   Stroke Father    Alzheimer's disease Father    High blood pressure Father    Heart Problems Mother    Social History   Socioeconomic History   Marital status: Single    Spouse name: Not on file   Number of children: 2   Years of education: B.S.   Highest education level: Not on file  Occupational History    Employer: SELF EMPLOYED    Comment: Computers - Works for [employer]  Tobacco Use   Smoking status: Every Day    Packs/day: 1.00    Types: Cigarettes   Smokeless tobacco: Never   Tobacco comments:    states new years resolution is to quit smoking  Vaping Use   Vaping Use: Never used  Substance and Sexual Activity   Alcohol use: No   Drug use: No   Sexual activity: Not on file  Other Topics Concern   Not on file  Social History Narrative   Patient lives at home alone and he is single. Reports being married in the past. Patient is self employed and works with computers.   Education B.S.   Right handed.   Caffeine one cup of tea daily and two pepsi daily.   Social Determinants of Health   Financial Resource Strain: High Risk (08/30/2022)   Overall Financial Resource Strain (CARDIA)    Difficulty of Paying Living Expenses: Hard  Food Insecurity: Food Insecurity Present (08/30/2022)   Hunger Vital Sign    Worried About Running Out of Food in the Last Year: Sometimes true    Ran Out of Food in the Last Year: Sometimes true  Transportation Needs: No Transportation Needs (08/30/2022)   PRAPARE - Hydrologist (Medical): No    Lack of Transportation (Non-Medical): No  Physical Activity:  Insufficiently Active (08/30/2022)   Exercise Vital Sign    Days of Exercise per Week: 3 days    Minutes of Exercise per Session: 10 min  Stress: No Stress Concern Present (08/30/2022)   [place]    Feeling of Stress : Only a little  Social Connections: Unknown (08/30/2022)   Social Connection and Isolation Panel [NHANES]    Frequency of Communication with Friends and Family: Once a week    Frequency of Social Gatherings with Friends and Family: Not on file    Attends Religious Services: Never    Marine scientist or Organizations: No    Attends Archivist Meetings: Never  Marital Status: Divorced    Tobacco Counseling Ready to quit: Not Answered Counseling given: Not Answered Tobacco comments: states new years resolution is to quit smoking   Clinical Intake:  Pre-visit preparation completed: No  Pain : No/denies pain     Nutritional Risks: None Diabetes: No  How often do you need to have someone help you when you read instructions, pamphlets, or other written materials from your doctor or pharmacy?: 1 - Never  Diabetic?No  Interpreter Needed?: No      Activities of Daily Living    08/30/2022    9:19 AM 05/02/2022    1:00 PM  In your present state of health, do you have any difficulty performing the following activities:  Hearing? 1 0  Vision? 0 0  Difficulty concentrating or making decisions? 1 0  Walking or climbing stairs? 1 0  Dressing or bathing? 0 0  Doing errands, shopping? 0 0  Preparing Food and eating ? N   Using the Toilet? N   In the past six months, have you accidently leaked urine? Y   Do you have problems with loss of bowel control? Y   Managing your Medications? N   Managing your Finances? N   Housekeeping or managing your Housekeeping? N     Patient Care Team: Gildardo Pounds, NP as PCP - General (Nurse Practitioner) Buford Dresser, MD as PCP  - Cardiology (Cardiology)  Indicate any recent Medical Services you may have received from other than [facility] providers in the past year (date may be approximate).     Assessment:   This is a routine wellness examination for Samuel Lawson.  Hearing/Vision screen No results found.  Dietary issues and exercise activities discussed: Current Exercise Habits: Home exercise routine, Type of exercise: walking, Time (Minutes): 10, Frequency (Times/Week): 3, Weekly Exercise (Minutes/Week): 30, Exercise limited by: None identified   Goals Addressed   None    Depression Screen    08/30/2022    9:11 AM 03/07/2022   11:30 AM 08/08/2021   10:00 AM 01/22/2021    4:36 PM 07/24/2020    9:51 AM 04/07/2020    8:50 AM 07/19/2019    9:22 AM  PHQ 2/9 Scores  PHQ - 2 Score 0 0 0 2 0 0 0  PHQ- 9 Score  '2 3 5 2 3 '$ 0    Fall Risk    08/30/2022    9:11 AM 07/30/2022    2:41 PM 03/07/2022   11:27 AM 08/08/2021   10:00 AM 07/24/2020    9:55 AM  [place] in the past year? 0 0 0 0 0  Number falls in past yr: 0 0 0 0 0  Injury with Fall? 0 0 0 0 0  Risk for fall due to : No Fall Risks   No Fall Risks   Follow up  Falls evaluation completed       FALL RISK PREVENTION PERTAINING TO THE HOME:  Any stairs in or around the home? No  If so, are there any without handrails? No  Home free of loose throw rugs in walkways, pet beds, electrical cords, etc? Yes  Adequate lighting in your home to reduce risk of falls? Yes   ASSISTIVE DEVICES UTILIZED TO PREVENT FALLS:  Life alert? No  Use of a cane, walker or w/c? No  Grab bars in the bathroom? Yes  Shower chair or bench in shower? Yes  Elevated toilet seat or a handicapped toilet? No  TIMED UP AND GO:  Was the test performed? No .  Length of time to ambulate 10 feet: N/A sec.   Gait slow and steady without use of assistive device  Cognitive Function:    08/30/2022    9:12 AM 08/08/2021   10:21 AM 04/09/2018    2:23 PM  MMSE - Mini Mental  State Exam  Orientation to time '5 5 5  '$ Orientation to Place '5 5 5  '$ Registration '3 3 3  '$ Attention/ Calculation '5 5 4  '$ Recall '3 3 2  '$ Language- name 2 objects '2 2 2  '$ Language- repeat '1 1 1  '$ Language- follow 3 step command '3 3 3  '$ Language- read & follow direction '1 1 1  '$ Write a sentence '1 1 1  '$ Copy design 1 0 1  Total score '30 29 28      '$ 01/02/2022   11:25 AM  Montreal Cognitive Assessment   Visuospatial/ Executive (0/5) 5  Naming (0/3) 2  Attention: Read list of digits (0/2) 1  Attention: Read list of letters (0/1) 1  Attention: Serial 7 subtraction starting at 100 (0/3) 3  Language: Repeat phrase (0/2) 1  Language : Fluency (0/1) 0  Abstraction (0/2) 2  Delayed Recall (0/5) 1  Orientation (0/6) 6  Total 22  Adjusted Score (based on education) 22      08/30/2022    9:13 AM  6CIT Screen  What Year? 0 points  What month? 0 points  What time? 0 points  Count back from 20 0 points  Months in reverse 0 points  Repeat phrase 0 points  Total Score 0 points    Immunizations Immunization History  Administered Date(s) Administered   Influenza,inj,Quad PF,6+ Mos 08/25/2013, 06/01/2014, 06/22/2015   PFIZER(Purple Top)SARS-COV-2 Vaccination 09/21/2020    TDAP status: Due, Education has been provided regarding the importance of this vaccine. Advised may receive this vaccine at local pharmacy or Health Dept. Aware to provide a copy of the vaccination record if obtained from local pharmacy or Health Dept. Verbalized acceptance and understanding.  Flu Vaccine status: Declined, Education has been provided regarding the importance of this vaccine but patient still declined. Advised may receive this vaccine at local pharmacy or Health Dept. Aware to provide a copy of the vaccination record if obtained from local pharmacy or Health Dept. Verbalized acceptance and understanding.  Pneumococcal vaccine status: Declined,  Education has been provided regarding the importance of this vaccine  but patient still declined. Advised may receive this vaccine at local pharmacy or Health Dept. Aware to provide a copy of the vaccination record if obtained from local pharmacy or Health Dept. Verbalized acceptance and understanding.   Covid-19 vaccine status: Completed vaccines  Qualifies for Shingles Vaccine? Yes   Zostavax completed No   Shingrix Completed?: No.    Education has been provided regarding the importance of this vaccine. Patient has been advised to call insurance company to determine out of pocket expense if they have not yet received this vaccine. Advised may also receive vaccine at local pharmacy or Health Dept. Verbalized acceptance and understanding.  Screening Tests Health Maintenance  Topic Date Due   Medicare Annual Wellness (AWV)  04/10/2019   COVID-19 Vaccine (2 - Pfizer risk series) 10/12/2020   COLON CANCER SCREENING ANNUAL FOBT  01/12/2021   Zoster Vaccines- Shingrix (1 of 2) 10/30/2022 (Originally 03/20/1965)   INFLUENZA VACCINE  12/08/2022 (Originally 04/09/2022)   Pneumonia Vaccine 40+ Years old (1 - PCV) 07/31/2023 (Originally 03/19/1952)  Hepatitis C Screening  Completed   HPV VACCINES  Aged Out   DTaP/Tdap/Td  Discontinued   COLONOSCOPY (Pts 45-19yr Insurance coverage will need to be confirmed)  Discontinued    Health Maintenance  Health Maintenance Due  Topic Date Due   Medicare Annual Wellness (AWV)  04/10/2019   COVID-19 Vaccine (2 - Pfizer risk series) 10/12/2020   COLON CANCER SCREENING ANNUAL FOBT  01/12/2021    Colorectal cancer screening: Referral to GI placed 08/26/2022. Pt aware the office will call re: appt.  Lung Cancer Screening: (Low Dose CT Chest recommended if Age 74-80years, 30 pack-year currently smoking OR have quit w/in 15years.) does qualify. Done 08/21/2021  Lung Cancer Screening Referral: NO  Additional Screening:  Hepatitis C Screening: does qualify; Completed 01/22/2016  Vision Screening: Recommended annual  ophthalmology exams for early detection of glaucoma and other disorders of the eye. Is the patient up to date with their annual eye exam?  No  Who is the provider or what is the name of the office in which the patient attends annual eye exams? N/A If pt is not established with a provider, would they like to be referred to a provider to establish care? No .   Dental Screening: Recommended annual dental exams for proper oral hygiene  Community Resource Referral / Chronic Care Management: CRR required this visit?  No   CCM required this visit?  No      Plan:     I have personally reviewed and noted the following in the patient's chart:   Medical and social history Use of alcohol, tobacco or illicit drugs  Current medications and supplements including opioid prescriptions. Patient is not currently taking opioid prescriptions. Functional ability and status Nutritional status Physical activity Advanced directives List of other physicians Hospitalizations, surgeries, and ER visits in previous 12 months Vitals Screenings to include cognitive, depression, and falls Referrals and appointments  In addition, I have reviewed and discussed with patient certain preventive protocols, quality metrics, and best practice recommendations. A written personalized care plan for preventive services as well as general preventive health recommendations were provided to patient.     AGomez Cleverly CProctor  08/30/2022   Nurse Notes: I spent 30 minutes on this telephone encounter AVS mailed to patinet Pt informed to contact GI to schedule Colonoscopy pt has been provided with contact information.

## 2022-09-10 ENCOUNTER — Ambulatory Visit
Admission: RE | Admit: 2022-09-10 | Discharge: 2022-09-10 | Disposition: A | Discharge status: Home / Self Care | Payer: Medicare HMO | Source: Ambulatory Visit | Attending: Nurse Practitioner | Admitting: Nurse Practitioner

## 2022-09-10 DIAGNOSIS — F17210 Nicotine dependence, cigarettes, uncomplicated: Secondary | ICD-10-CM | POA: Diagnosis not present

## 2022-09-10 DIAGNOSIS — F17290 Nicotine dependence, other tobacco product, uncomplicated: Secondary | ICD-10-CM

## 2022-09-12 ENCOUNTER — Ambulatory Visit (HOSPITAL_COMMUNITY)
Admission: RE | Admit: 2022-09-12 | Discharge: 2022-09-12 | Disposition: A | Discharge status: Home / Self Care | Payer: Medicare HMO | Source: Ambulatory Visit | Attending: Urology | Admitting: Urology

## 2022-09-12 ENCOUNTER — Other Ambulatory Visit (HOSPITAL_COMMUNITY): Payer: Self-pay | Admitting: Urology

## 2022-09-12 DIAGNOSIS — C642 Malignant neoplasm of left kidney, except renal pelvis: Secondary | ICD-10-CM

## 2022-09-12 DIAGNOSIS — I10 Essential (primary) hypertension: Secondary | ICD-10-CM | POA: Diagnosis not present

## 2022-09-16 DIAGNOSIS — C642 Malignant neoplasm of left kidney, except renal pelvis: Secondary | ICD-10-CM | POA: Diagnosis not present

## 2022-09-16 DIAGNOSIS — N281 Cyst of kidney, acquired: Secondary | ICD-10-CM | POA: Diagnosis not present

## 2022-09-16 DIAGNOSIS — K8020 Calculus of gallbladder without cholecystitis without obstruction: Secondary | ICD-10-CM | POA: Diagnosis not present

## 2022-09-16 DIAGNOSIS — K5730 Diverticulosis of large intestine without perforation or abscess without bleeding: Secondary | ICD-10-CM | POA: Diagnosis not present

## 2022-09-30 ENCOUNTER — Telehealth: Payer: Self-pay | Admitting: Nurse Practitioner

## 2022-09-30 ENCOUNTER — Other Ambulatory Visit: Payer: Self-pay | Admitting: Family Medicine

## 2022-09-30 ENCOUNTER — Other Ambulatory Visit: Payer: Self-pay

## 2022-09-30 DIAGNOSIS — E785 Hyperlipidemia, unspecified: Secondary | ICD-10-CM

## 2022-09-30 MED ORDER — ATORVASTATIN CALCIUM 40 MG PO TABS
40.0000 mg | ORAL_TABLET | Freq: Every day | ORAL | 0 refills | Status: DC
  Filled 2022-09-30: qty 90, 90d supply, fill #0

## 2022-09-30 NOTE — Telephone Encounter (Signed)
Blood positive FIT. GI has been trying to reach him. He needs to call them to schedule. Pls give him the number. Thanks  [facility] [address] [place], [place] [address] PH# [phone]

## 2022-10-01 DIAGNOSIS — D49512 Neoplasm of unspecified behavior of left kidney: Secondary | ICD-10-CM | POA: Diagnosis not present

## 2022-10-01 NOTE — Telephone Encounter (Signed)
Detailed voicemail left.

## 2022-10-01 NOTE — Telephone Encounter (Signed)
Return call going straight to voicemail. Unable to reach patient by phone, voicemail left.

## 2022-10-02 ENCOUNTER — Other Ambulatory Visit: Payer: Self-pay | Admitting: Nurse Practitioner

## 2022-10-02 ENCOUNTER — Other Ambulatory Visit: Payer: Self-pay

## 2022-10-02 MED ORDER — ALBUTEROL SULFATE HFA 108 (90 BASE) MCG/ACT IN AERS
1.0000 | INHALATION_SPRAY | Freq: Four times a day (QID) | RESPIRATORY_TRACT | 0 refills | Status: DC | PRN
  Filled 2022-10-02: qty 18, 25d supply, fill #0

## 2022-10-02 NOTE — Telephone Encounter (Signed)
Requested medication (s) are due for refill today: see below  Requested medication (s) are on the active medication list: no  Last refill:  10/06/20  Future visit scheduled: yes  Notes to clinic:  please advise for refill if appropriate.   The original prescription was discontinued on 01/25/2021 by Gildardo Pounds, NP for the following reason: Duplicate     Requested Prescriptions  Pending Prescriptions Disp Refills   albuterol (VENTOLIN HFA) 108 (90 Base) MCG/ACT inhaler 18 g 0    Sig: INHALE 1-2 PUFFS INTO THE LUNGS EVERY 6 (SIX) HOURS AS NEEDED FOR WHEEZING OR SHORTNESS OF BREATH. OR PERSISTENT COUGH     Pulmonology:  Beta Agonists 2 Passed - 10/02/2022  9:33 AM      Passed - Last BP in normal range    BP Readings from Last 1 Encounters:  07/30/22 115/71         Passed - Last Heart Rate in normal range    Pulse Readings from Last 1 Encounters:  07/30/22 64         Passed - Valid encounter within last 12 months    Recent Outpatient Visits           2 months ago Nocturnal muscle spasm   [facility] [place], Vernia Buff, NP   6 months ago Other tobacco product nicotine dependence, uncomplicated   [place], [place], MD   11 months ago Upper respiratory tract infection, unspecified type   [facility], Kriste Basque, NP   1 year ago [place], Vernia Buff, NP   1 year ago [place] Gildardo Pounds, NP       Future Appointments             In 4 weeks Gildardo Pounds, NP [place]

## 2022-10-04 ENCOUNTER — Other Ambulatory Visit: Payer: Self-pay

## 2022-10-04 ENCOUNTER — Ambulatory Visit (AMBULATORY_SURGERY_CENTER): Payer: Medicare HMO

## 2022-10-04 VITALS — Ht 67.0 in | Wt 175.0 lb

## 2022-10-04 DIAGNOSIS — K921 Melena: Secondary | ICD-10-CM

## 2022-10-04 MED ORDER — PEG 3350-KCL-NA BICARB-NACL 420 G PO SOLR
4000.0000 mL | Freq: Once | ORAL | 0 refills | Status: AC
  Filled 2022-10-04: qty 4000, 1d supply, fill #0

## 2022-10-04 NOTE — Progress Notes (Signed)
No egg or soy allergy known to patient  No issues known to pt with past sedation with any surgeries or procedures Patient denies ever being told they had issues or difficulty with intubation  No FH of Malignant Hyperthermia Pt is not on diet pills Pt is not on  home 02  Pt is not on blood thinners  Pt denies issues with constipation  No A fib or A flutter Have any cardiac testing pending--no Pt instructed to use [email or web address] or GoodRx for a price reduction on prep   

## 2022-10-14 ENCOUNTER — Encounter: Payer: Self-pay | Admitting: Gastroenterology

## 2022-10-18 ENCOUNTER — Other Ambulatory Visit: Payer: Self-pay

## 2022-10-18 ENCOUNTER — Other Ambulatory Visit: Payer: Self-pay | Admitting: Family Medicine

## 2022-10-18 MED ORDER — OMEGA-3-ACID ETHYL ESTERS 1 G PO CAPS
1.0000 g | ORAL_CAPSULE | Freq: Two times a day (BID) | ORAL | 1 refills | Status: DC
  Filled 2022-10-18: qty 180, 90d supply, fill #0
  Filled 2023-01-21: qty 180, 90d supply, fill #1

## 2022-10-18 NOTE — Telephone Encounter (Signed)
Requested Prescriptions  Pending Prescriptions Disp Refills   omega-3 acid ethyl esters (LOVAZA) 1 g capsule 180 capsule 1    Sig: TAKE 1 CAPSULE (1 G TOTAL) BY MOUTH 2 (TWO) TIMES DAILY.     Endocrinology:  Nutritional Agents - omega-3 acid ethyl esters Failed - 10/18/2022  9:35 AM      Failed - Lipid Panel in normal range within the last 12 months    Cholesterol, Total  Date Value Ref Range Status  03/07/2022 146 100 - 199 mg/dL Final   LDL Chol Calc (NIH)  Date Value Ref Range Status  03/07/2022 76 0 - 99 mg/dL Final   HDL  Date Value Ref Range Status  03/07/2022 53 >39 mg/dL Final   Triglycerides  Date Value Ref Range Status  03/07/2022 90 0 - 149 mg/dL Final         Passed - Valid encounter within last 12 months    Recent Outpatient Visits           2 months ago Nocturnal muscle spasm   [place], Vernia Buff, NP   7 months ago Other tobacco product nicotine dependence, uncomplicated   [place], Enobong, MD   11 months ago Upper respiratory tract infection, unspecified type   [facility], Kriste Basque, NP   1 year ago [place], Vernia Buff, NP   1 year ago Prediabetes   [place], Vernia Buff, NP       Future Appointments             In 1 week Gildardo Pounds, NP [place]

## 2022-10-21 ENCOUNTER — Other Ambulatory Visit: Payer: Self-pay

## 2022-10-24 ENCOUNTER — Encounter: Payer: Self-pay | Admitting: Gastroenterology

## 2022-10-24 ENCOUNTER — Ambulatory Visit (AMBULATORY_SURGERY_CENTER): Payer: Medicare HMO | Admitting: Gastroenterology

## 2022-10-24 VITALS — BP 96/60 | HR 60 | Temp 96.9°F | Resp 13 | Ht 67.0 in | Wt 175.0 lb

## 2022-10-24 DIAGNOSIS — Z1211 Encounter for screening for malignant neoplasm of colon: Secondary | ICD-10-CM | POA: Diagnosis not present

## 2022-10-24 DIAGNOSIS — D125 Benign neoplasm of sigmoid colon: Secondary | ICD-10-CM | POA: Diagnosis not present

## 2022-10-24 DIAGNOSIS — D124 Benign neoplasm of descending colon: Secondary | ICD-10-CM | POA: Diagnosis not present

## 2022-10-24 DIAGNOSIS — I10 Essential (primary) hypertension: Secondary | ICD-10-CM | POA: Diagnosis not present

## 2022-10-24 DIAGNOSIS — I2510 Atherosclerotic heart disease of native coronary artery without angina pectoris: Secondary | ICD-10-CM | POA: Diagnosis not present

## 2022-10-24 DIAGNOSIS — D123 Benign neoplasm of transverse colon: Secondary | ICD-10-CM | POA: Diagnosis not present

## 2022-10-24 DIAGNOSIS — D128 Benign neoplasm of rectum: Secondary | ICD-10-CM

## 2022-10-24 DIAGNOSIS — R7303 Prediabetes: Secondary | ICD-10-CM | POA: Diagnosis not present

## 2022-10-24 DIAGNOSIS — R195 Other fecal abnormalities: Secondary | ICD-10-CM | POA: Diagnosis not present

## 2022-10-24 DIAGNOSIS — K5730 Diverticulosis of large intestine without perforation or abscess without bleeding: Secondary | ICD-10-CM | POA: Diagnosis not present

## 2022-10-24 DIAGNOSIS — D127 Benign neoplasm of rectosigmoid junction: Secondary | ICD-10-CM | POA: Diagnosis not present

## 2022-10-24 MED ORDER — SODIUM CHLORIDE 0.9 % IV SOLN: 500.0000 mL | INTRAVENOUS | Status: DC

## 2022-10-24 NOTE — Progress Notes (Signed)
[facility] History and Physical   Primary Care Physician:  Gildardo Pounds, NP   Reason for Procedure:   Positive Cologuard  Plan:    colonoscopy     HPI: Samuel Lawson is a 77 y.o. male  here for colonoscopy to evaluate positive cologuard. He states he had a colonoscopy a long time ago, can't remember when. Denies problems with his bowels, no blood in the stools. He does smoke cigarettes. No family history of colon cancer known. Otherwise feels well without any cardiopulmonary symptoms, wants to proceed.  I have discussed risks / benefits of anesthesia and endoscopic procedure with Samuel Lawson and they wish to proceed with the exams as outlined today.    Past Medical History:  Diagnosis Date   Acute MI [facility]) 2009   STENTS PLACED   Chronic kidney disease    Collagen vascular disease ([place])    Hyperlipidemia    Hypertension    Pre-diabetes    Prediabetes     Past Surgical History:  Procedure Laterality Date   ANGIOPLASTY     CARDIAC CATHETERIZATION     INCISIONAL HERNIA REPAIR N/A 05/02/2022   Procedure: LAPAROSCOPIC INCISIONAL HERNIA REPAIR WITH MESH;  Surgeon: Coralie Keens, MD;  Location: [facility];  Service: General;  Laterality: N/A;   ROBOTIC ASSITED PARTIAL NEPHRECTOMY Left 12/05/2021   Procedure: XI ROBOTIC ASSITED PARTIAL NEPHRECTOMY;  Surgeon: Ardis Hughs, MD;  Location: [facility];  Service: Urology;  Laterality: Left;  3.5 HRS    Prior to Admission medications   Medication Sig Start Date End Date Taking? Authorizing Provider  aspirin EC 81 MG tablet Take 81 mg by mouth at bedtime. Swallow whole.   Yes [provider]  atorvastatin (LIPITOR) 40 MG tablet Take 1 tablet (40 mg total) by mouth at bedtime. 09/30/22  Yes Charlott Rakes, MD  carvedilol (COREG) 3.125 MG tablet Take 1 tablet (3.125 mg total) by mouth 2 (two) times daily with a meal. 08/28/22  Yes Newlin, Enobong, MD  Cholecalciferol (VITAMIN D3) 125 MCG (5000 UT) CAPS  Take 5,000 Units by mouth daily. When compliant   Yes [provider]  Multiple Vitamins-Minerals (MULTIVITAMIN WITH MINERALS) tablet Take 1 tablet by mouth daily.   Yes [provider]  omega-3 acid ethyl esters (LOVAZA) 1 g capsule Take 1 capsule (1 g total) by mouth 2 (two) times daily. 10/18/22  Yes Gildardo Pounds, NP  tamsulosin (FLOMAX) 0.4 MG CAPS capsule Take 2 capsules by mouth once daily. Patient taking differently: Take 0.4 mg by mouth daily. 09/18/21  Yes   albuterol (VENTOLIN HFA) 108 (90 Base) MCG/ACT inhaler INHALE 1-2 PUFFS INTO THE LUNGS EVERY 6 (SIX) HOURS AS NEEDED FOR WHEEZING OR SHORTNESS OF BREATH. OR PERSISTENT COUGH Patient not taking: Reported on 10/04/2022 10/02/22   Charlott Rakes, MD  Blood Glucose Calibration (ACCU-CHEK GUIDE CONTROL) LIQD 1 each by In Vitro route once as needed for up to 1 dose. R73.03 Patient not taking: Reported on 07/30/2022 01/07/20   Gildardo Pounds, NP  fluticasone [facility]) 50 MCG/ACT nasal spray Place 2 sprays into both nostrils daily. Patient not taking: Reported on 10/04/2022 03/07/22   Charlott Rakes, MD  glucose blood (ACCU-CHEK GUIDE) test strip Use as instructed. Check blood glucose by fingerstick once per day. R73.03 10/23/21   Charlott Rakes, MD  ondansetron (ZOFRAN-ODT) 8 MG disintegrating tablet Take 8 mg by mouth every 8 (eight) hours as needed. Patient not taking: Reported on 10/04/2022 12/07/21   [provider]  oxyCODONE (OXY IR/ROXICODONE) 5 MG immediate release tablet Take 1 tablet (5 mg total) by mouth every 6 (six) hours as needed for moderate pain or severe pain. Patient not taking: Reported on 08/30/2022 05/03/22   Coralie Keens, MD  senna-docusate (SENOKOT-S) 8.6-50 MG tablet Take 1 tablet by mouth daily. For constipation 07/30/22   Gildardo Pounds, NP  promethazine (PHENERGAN) 25 MG tablet Take 1 tablet (25 mg total) by mouth every 6 (six) hours as needed for nausea. 06/18/12 12/07/12  Schinlever,  Barnetta Chapel, PA-C    Current Outpatient Medications  Medication Sig Dispense Refill   aspirin EC 81 MG tablet Take 81 mg by mouth at bedtime. Swallow whole.     atorvastatin (LIPITOR) 40 MG tablet Take 1 tablet (40 mg total) by mouth at bedtime. 90 tablet 0   carvedilol (COREG) 3.125 MG tablet Take 1 tablet (3.125 mg total) by mouth 2 (two) times daily with a meal. 180 tablet 1   Cholecalciferol (VITAMIN D3) 125 MCG (5000 UT) CAPS Take 5,000 Units by mouth daily. When compliant     Multiple Vitamins-Minerals (MULTIVITAMIN WITH MINERALS) tablet Take 1 tablet by mouth daily.     omega-3 acid ethyl esters (LOVAZA) 1 g capsule Take 1 capsule (1 g total) by mouth 2 (two) times daily. 180 capsule 1   tamsulosin (FLOMAX) 0.4 MG CAPS capsule Take 2 capsules by mouth once daily. (Patient taking differently: Take 0.4 mg by mouth daily.) 60 capsule 11   albuterol (VENTOLIN HFA) 108 (90 Base) MCG/ACT inhaler INHALE 1-2 PUFFS INTO THE LUNGS EVERY 6 (SIX) HOURS AS NEEDED FOR WHEEZING OR SHORTNESS OF BREATH. OR PERSISTENT COUGH (Patient not taking: Reported on 10/04/2022) 18 g 0   Blood Glucose Calibration (ACCU-CHEK GUIDE CONTROL) LIQD 1 each by In Vitro route once as needed for up to 1 dose. R73.03 (Patient not taking: Reported on 07/30/2022) 1 each 0   fluticasone (FLONASE) 50 MCG/ACT nasal spray Place 2 sprays into both nostrils daily. (Patient not taking: Reported on 10/04/2022) 16 g 1   glucose blood (ACCU-CHEK GUIDE) test strip Use as instructed. Check blood glucose by fingerstick once per day. R73.03 100 each 0   ondansetron (ZOFRAN-ODT) 8 MG disintegrating tablet Take 8 mg by mouth every 8 (eight) hours as needed. (Patient not taking: Reported on 10/04/2022)     oxyCODONE (OXY IR/ROXICODONE) 5 MG immediate release tablet Take 1 tablet (5 mg total) by mouth every 6 (six) hours as needed for moderate pain or severe pain. (Patient not taking: Reported on 08/30/2022) 25 tablet 0   senna-docusate (SENOKOT-S)  8.6-50 MG tablet Take 1 tablet by mouth daily. For constipation 100 tablet 1   Current Facility-Administered Medications  Medication Dose Route Frequency Provider Last Rate Last Admin   0.9 %  sodium chloride infusion  500 mL Intravenous Continuous Hazelee Harbold, Carlota Raspberry, MD        Allergies as of 10/24/2022   (No Known Allergies)    Family History  Problem Relation Age of Onset   Heart Problems Mother    Stroke Father    Alzheimer's disease Father    High blood pressure Father    Colon cancer Neg Hx    Colon polyps Neg Hx    Esophageal cancer Neg Hx    Rectal cancer Neg Hx    Stomach cancer Neg Hx     Social History   Socioeconomic History   Marital status: Single    Spouse name: Not on file   Number  of children: 2   Years of education: B.S.   Highest education level: Not on file  Occupational History    Employer: SELF EMPLOYED    Comment: Computers - Works for [employer]  Tobacco Use   Smoking status: Every Day    Packs/day: 1.00    Types: Cigarettes   Smokeless tobacco: Never   Tobacco comments:    states new years resolution is to quit smoking  Vaping Use   Vaping Use: Never used  Substance and Sexual Activity   Alcohol use: No   Drug use: No   Sexual activity: Not on file  Other Topics Concern   Not on file  Social History Narrative   Patient lives at home alone and he is single. Reports being married in the past. Patient is self employed and works with computers.   Education B.S.   Right handed.   Caffeine one cup of tea daily and two pepsi daily.   Social Determinants of Health   Financial Resource Strain: High Risk (08/30/2022)   Overall Financial Resource Strain (CARDIA)    Difficulty of Paying Living Expenses: Hard  Food Insecurity: Food Insecurity Present (08/30/2022)   Hunger Vital Sign    Worried About Running Out of Food in the Last Year: Sometimes true    Ran Out of Food in the Last Year: Sometimes true  Transportation Needs: No  Transportation Needs (08/30/2022)   PRAPARE - Hydrologist (Medical): No    Lack of Transportation (Non-Medical): No  Physical Activity: Insufficiently Active (08/30/2022)   Exercise Vital Sign    Days of Exercise per Week: 3 days    Minutes of Exercise per Session: 10 min  Stress: No Stress Concern Present (08/30/2022)   [place]    Feeling of Stress : Only a little  Social Connections: Unknown (08/30/2022)   Social Connection and Isolation Panel [NHANES]    Frequency of Communication with Friends and Family: Once a week    Frequency of Social Gatherings with Friends and Family: Not on file    Attends Religious Services: Never    Marine scientist or Organizations: No    Attends Archivist Meetings: Never    Marital Status: Divorced  Human resources officer Violence: Not At Risk (08/30/2022)   Humiliation, Afraid, Rape, and Kick questionnaire    Fear of Current or Ex-Partner: No    Emotionally Abused: No    Physically Abused: No    Sexually Abused: No    Review of Systems: All other review of systems negative except as mentioned in the HPI.  Physical Exam: Vital signs BP 121/67   Pulse (!) 50   Temp (!) 96.9 F (36.1 C)   Ht 5' 7"$  (1.702 m)   Wt 175 lb (79.4 kg)   SpO2 99%   BMI 27.41 kg/m   General:   Alert,  Well-developed, pleasant and cooperative in NAD Lungs:  Clear throughout to auscultation.   Heart:  Regular rate and rhythm Abdomen:  Soft, nontender and nondistended.   Neuro/Psych:  Alert and cooperative. Normal mood and affect. A and O x 3  Jolly Mango, MD [address]

## 2022-10-24 NOTE — Op Note (Addendum)
[place] Patient Name: Samuel Lawson Procedure Date: 10/24/2022 11:27 AM MRN: [id number] Endoscopist: Samuel Lawson , MD, [id number] Age: 77 Referring MD:  Date of Birth: 29-May-1946 Gender: Male Account #: [id number] Procedure:                Colonoscopy Indications:              Heme positive stool, Positive Cologuard test Medicines:                Monitored Anesthesia Care Procedure:                Pre-Anesthesia Assessment:                           - Prior to the procedure, a History and Physical                            was performed, and patient medications and                            allergies were reviewed. The patient's tolerance of                            previous anesthesia was also reviewed. The risks                            and benefits of the procedure and the sedation                            options and risks were discussed with the patient.                            All questions were answered, and informed consent                            was obtained. Prior Anticoagulants: The patient has                            taken no anticoagulant or antiplatelet agents. ASA                            Grade Assessment: III - A patient with severe                            systemic disease. After reviewing the risks and                            benefits, the patient was deemed in satisfactory                            condition to undergo the procedure.                           After obtaining informed consent, the colonoscope  was passed under direct vision. Throughout the                            procedure, the patient's blood pressure, pulse, and                            oxygen saturations were monitored continuously. The                            CF [id number] was introduced through the anus                            and advanced to the the cecum, identified by                             appendiceal orifice and ileocecal valve. The                            colonoscopy was performed without difficulty. The                            patient tolerated the procedure well. The quality                            of the bowel preparation was adequate. The                            ileocecal valve, appendiceal orifice, and rectum                            were photographed. Scope In: 11:52:43 AM Scope Out: 12:29:02 PM Scope Withdrawal Time: 0 hours 25 minutes 39 seconds  Total Procedure Duration: 0 hours 36 minutes 19 seconds  Findings:                 The perianal and digital rectal examinations were                            normal.                           An 8 mm polyp was found in the hepatic flexure. The                            polyp was sessile. The polyp was removed with a                            cold snare. Resection and retrieval were complete.                           A 7 to 8 mm polyp was found in the proximal                            transverse colon. The polyp was pedunculated. The  polyp was removed with a hot snare. Resection and                            retrieval were complete.                           Five sessile polyps were found in the transverse                            colon. The polyps were 3 to 10 mm in size. These                            polyps were removed with a cold snare. Resection                            and retrieval were complete.                           A 4 mm polyp was found in the splenic flexure. The                            polyp was sessile. The polyp was removed with a                            cold snare. Resection and retrieval were complete.                           Four sessile polyps were found in the descending                            colon. The polyps were 3 to 4 mm in size. These                            polyps were removed with a cold snare. Resection                             and retrieval were complete.                           Three sessile polyps were found in the sigmoid                            colon. The polyps were 3 to 6 mm in size. These                            polyps were removed with a cold snare. Resection                            and retrieval were complete.                           Two pedunculated polyps were found in the sigmoid  colon. The polyps were 10 to 12 mm in size. These                            polyps were removed with a hot snare. Resection and                            retrieval were complete.                           A 4 mm polyp was found in the rectum. The polyp was                            sessile. The polyp was removed with a cold snare.                            Resection and retrieval were complete.                           Many medium-mouthed diverticula were found in the                            entire colon.                           Internal hemorrhoids were found during retroflexion.                           Anal papilla(e) were hypertrophied.                           The exam was otherwise without abnormality. Complications:            No immediate complications. Estimated blood loss:                            Minimal. Estimated Blood Loss:     Estimated blood loss was minimal. Impression:               - One 8 mm polyp at the hepatic flexure, removed                            with a cold snare. Resected and retrieved.                           - One 7 to 8 mm polyp in the proximal transverse                            colon, removed with a hot snare. Resected and                            retrieved.                           - Five 3 to 10 mm polyps in the transverse colon,  removed with a cold snare. Resected and retrieved.                           - One 4 mm polyp at the splenic flexure, removed                            with a  cold snare. Resected and retrieved.                           - Four 3 to 4 mm polyps in the descending colon,                            removed with a cold snare. Resected and retrieved.                           - Three 3 to 6 mm polyps in the sigmoid colon,                            removed with a cold snare. Resected and retrieved.                           - Two 10 to 12 mm polyps in the sigmoid colon,                            removed with a hot snare. Resected and retrieved.                           - One 4 mm polyp in the rectum, removed with a cold                            snare. Resected and retrieved.                           - Diverticulosis in the entire examined colon.                           - Internal hemorrhoids.                           - Anal papilla(e) were hypertrophied.                           - The examination was otherwise normal.                           Overall, 18 polyps removed, 3 via hot snare, 3 were                            1cm or greater in size Recommendation:           - Patient has a contact number available for  emergencies. The signs and symptoms of potential                            delayed complications were discussed with the                            patient. Return to normal activities tomorrow.                            Written discharge instructions were provided to the                            patient.                           - Resume previous diet.                           - Continue present medications.                           - Await pathology results.                           - No ibuprofen, naproxen, or other non-steroidal                            anti-inflammatory drugs for 2 weeks after polyp                            removal. Samuel Lipps P. Yug Loria, MD 10/24/2022 12:39:13 PM This report has been signed electronically.

## 2022-10-24 NOTE — Progress Notes (Signed)
Pt's states no medical or surgical changes since previsit or office visit. 

## 2022-10-24 NOTE — Progress Notes (Signed)
Called to room to assist during endoscopic procedure.  Patient ID and intended procedure confirmed with present staff. Received instructions for my participation in the procedure from the performing physician.  

## 2022-10-24 NOTE — Patient Instructions (Signed)
Information on polyps, hemorrhoids and diverticulosis given to you today.  Await pathology results.  Resume previous diet and medications.   Avoid NSAIDS (Aspirin, Ibuprofen, Aleve, Naproxen), you may use Tylenol as needed.   YOU HAD AN ENDOSCOPIC PROCEDURE TODAY AT [facility]:   Refer to the procedure report that was given to you for any specific questions about what was found during the examination.  If the procedure report does not answer your questions, please call your gastroenterologist to clarify.  If you requested that your care partner not be given the details of your procedure findings, then the procedure report has been included in a sealed envelope for you to review at your convenience later.  YOU SHOULD EXPECT: Some feelings of bloating in the abdomen. Passage of more gas than usual.  Walking can help get rid of the air that was put into your GI tract during the procedure and reduce the bloating. If you had a lower endoscopy (such as a colonoscopy or flexible sigmoidoscopy) you may notice spotting of blood in your stool or on the toilet paper. If you underwent a bowel prep for your procedure, you may not have a normal bowel movement for a few days.  Please Note:  You might notice some irritation and congestion in your nose or some drainage.  This is from the oxygen used during your procedure.  There is no need for concern and it should clear up in a day or so.  SYMPTOMS TO REPORT IMMEDIATELY:  Following lower endoscopy (colonoscopy or flexible sigmoidoscopy):  Excessive amounts of blood in the stool  Significant tenderness or worsening of abdominal pains  Swelling of the abdomen that is new, acute  Fever of 100F or higher   For urgent or emergent issues, a gastroenterologist can be reached at any hour by calling [phone]. Do not use MyChart messaging for urgent concerns.    DIET:  We do recommend a small meal at first, but then you may proceed to  your regular diet.  Drink plenty of fluids but you should avoid alcoholic beverages for 24 hours.  ACTIVITY:  You should plan to take it easy for the rest of today and you should NOT DRIVE or use heavy machinery until tomorrow (because of the sedation medicines used during the test).    FOLLOW UP: Our staff will call the number listed on your records the next business day following your procedure.  We will call around 7:15- 8:00 am to check on you and address any questions or concerns that you may have regarding the information given to you following your procedure. If we do not reach you, we will leave a message.     If any biopsies were taken you will be contacted by phone or by letter within the next 1-3 weeks.  Please call us at [phone] if you have not heard about the biopsies in 3 weeks.    SIGNATURES/CONFIDENTIALITY: You and/or your care partner have signed paperwork which will be entered into your electronic medical record.  These signatures attest to the fact that that the information above on your After Visit Summary has been reviewed and is understood.  Full responsibility of the confidentiality of this discharge information lies with you and/or your care-partner.

## 2022-10-24 NOTE — Progress Notes (Signed)
Pt resting comfortably. VSS. Airway intact. SBAR complete to RN. All questions answered.   

## 2022-10-25 ENCOUNTER — Telehealth: Payer: Self-pay | Admitting: *Deleted

## 2022-10-25 ENCOUNTER — Telehealth: Payer: Self-pay | Admitting: Gastroenterology

## 2022-10-25 NOTE — Telephone Encounter (Signed)
  Follow up Call-     10/24/2022   11:14 AM  Call back number  Post procedure Call Back phone  # [phone]  Permission to leave phone message Yes     Patient questions:  Do you have a fever, pain , or abdominal swelling? No. Pain Score  0 *  Have you tolerated food without any problems? Yes.    Have you been able to return to your normal activities? Yes.    Do you have any questions about your discharge instructions: Diet   No. Medications  No. Follow up visit  No.  Do you have questions or concerns about your Care? No.  Actions: * If pain score is 4 or above: No action needed, pain <4.  Did return the patient's phone call regarding need of a pro biotic post procedure. Dr. [name] did not recommend one this was relayed to the patient and her verbalized understanding.

## 2022-10-25 NOTE — Telephone Encounter (Signed)
  Follow up Call-     10/24/2022   11:14 AM  Call back number  Post procedure Call Back phone  # [phone]  Permission to leave phone message Yes     Post procedure follow up phone call. No answer at number given.  Left message on voicemail.

## 2022-10-25 NOTE — Telephone Encounter (Signed)
PT had a colonoscopy yesterday and wanted to know was he supposed to have a probiotic RX sent for him. Please advise

## 2022-10-30 ENCOUNTER — Encounter: Payer: Self-pay | Admitting: Gastroenterology

## 2022-10-31 ENCOUNTER — Encounter: Payer: Self-pay | Admitting: Nurse Practitioner

## 2022-10-31 ENCOUNTER — Ambulatory Visit: Payer: Medicare HMO | Attending: Nurse Practitioner | Admitting: Nurse Practitioner

## 2022-10-31 ENCOUNTER — Other Ambulatory Visit: Payer: Self-pay

## 2022-10-31 VITALS — BP 108/62 | HR 55 | Temp 98.2°F | Resp 20 | Ht 65.0 in | Wt 184.0 lb

## 2022-10-31 DIAGNOSIS — R7303 Prediabetes: Secondary | ICD-10-CM

## 2022-10-31 DIAGNOSIS — M2550 Pain in unspecified joint: Secondary | ICD-10-CM

## 2022-10-31 DIAGNOSIS — H9312 Tinnitus, left ear: Secondary | ICD-10-CM | POA: Diagnosis not present

## 2022-10-31 DIAGNOSIS — D72829 Elevated white blood cell count, unspecified: Secondary | ICD-10-CM | POA: Diagnosis not present

## 2022-10-31 DIAGNOSIS — I2510 Atherosclerotic heart disease of native coronary artery without angina pectoris: Secondary | ICD-10-CM

## 2022-10-31 DIAGNOSIS — H43391 Other vitreous opacities, right eye: Secondary | ICD-10-CM | POA: Diagnosis not present

## 2022-10-31 DIAGNOSIS — E785 Hyperlipidemia, unspecified: Secondary | ICD-10-CM

## 2022-10-31 DIAGNOSIS — F17200 Nicotine dependence, unspecified, uncomplicated: Secondary | ICD-10-CM | POA: Diagnosis not present

## 2022-10-31 LAB — GLUCOSE, POCT (MANUAL RESULT ENTRY): POC Glucose: 117 mg/dl — AB (ref 70–99)

## 2022-10-31 LAB — POCT GLYCOSYLATED HEMOGLOBIN (HGB A1C): HbA1c, POC (prediabetic range): 5.9 % (ref 5.7–6.4)

## 2022-10-31 MED ORDER — NICOTINE 21 MG/24HR TD PT24
21.0000 mg | MEDICATED_PATCH | Freq: Every day | TRANSDERMAL | 0 refills | Status: AC
  Filled 2022-10-31: qty 42, 42d supply, fill #0
  Filled 2023-04-23: qty 28, 28d supply, fill #0

## 2022-10-31 MED ORDER — ALBUTEROL SULFATE HFA 108 (90 BASE) MCG/ACT IN AERS
1.0000 | INHALATION_SPRAY | Freq: Four times a day (QID) | RESPIRATORY_TRACT | 1 refills | Status: DC | PRN
  Filled 2022-10-31: qty 18, 25d supply, fill #0

## 2022-10-31 MED ORDER — CARVEDILOL 3.125 MG PO TABS
3.1250 mg | ORAL_TABLET | Freq: Two times a day (BID) | ORAL | 1 refills | Status: DC
  Filled 2022-10-31 – 2022-11-29 (×2): qty 180, 90d supply, fill #0
  Filled 2023-03-10: qty 180, 90d supply, fill #1

## 2022-10-31 MED ORDER — ATORVASTATIN CALCIUM 40 MG PO TABS
40.0000 mg | ORAL_TABLET | Freq: Every day | ORAL | 3 refills | Status: DC
  Filled 2022-10-31 – 2022-12-30 (×2): qty 90, 90d supply, fill #0
  Filled 2023-04-04 (×2): qty 90, 90d supply, fill #1

## 2022-10-31 MED ORDER — DICLOFENAC SODIUM 1 % EX GEL
2.0000 g | Freq: Four times a day (QID) | CUTANEOUS | 0 refills | Status: DC
  Filled 2022-10-31: qty 200, 25d supply, fill #0

## 2022-10-31 NOTE — Progress Notes (Signed)
Refill on albuterol inhaler' Would like nicotine patch  CBG-117  A1C- 5.9

## 2022-10-31 NOTE — Patient Instructions (Signed)
[facility] Address: [address] [place], [place] [address] Phone: [phone]

## 2022-10-31 NOTE — Progress Notes (Signed)
Assessment & Plan:  Diagnoses and all orders for this visit:  Prediabetes -     HgB A1c -     Glucose (CBG) -     CMP14+EGFR Continue blood sugar control as discussed in office today, low carbohydrate diet, and regular physical exercise as tolerated, 150 minutes per week (30 min each day, 5 days per week, or 50 min 3 days per week). Annual eye exams and foot exams are recommended.   Tinnitus, left ear -     Ambulatory referral to ENT  Leukocytosis, unspecified type -     CBC with Differential  Tobacco dependence -     albuterol (VENTOLIN HFA) 108 (90 Base) MCG/ACT inhaler; Inhale 1-2 puffs into the lungs every 6 (six) hours as needed. -     nicotine (NICODERM CQ - DOSED IN MG/24 HOURS) 21 mg/24hr patch; Place 1 patch (21 mg total) onto the skin daily.  Dyslipidemia, goal LDL below 70 -     atorvastatin (LIPITOR) 40 MG tablet; Take 1 tablet (40 mg total) by mouth at bedtime.  Coronary artery disease involving native coronary artery of native heart without angina pectoris -     carvedilol (COREG) 3.125 MG tablet; Take 1 tablet (3.125 mg total) by mouth 2 (two) times daily with a meal. -     Lipid panel  Arthralgia of multiple joints -     diclofenac Sodium (VOLTAREN) 1 % GEL; Apply 2 g topically 4 (four) times daily. For joint pain  Vitreous floaters of right eye -     Ambulatory referral to Ophthalmology    Patient has been counseled on age-appropriate routine health concerns for screening and prevention. These are reviewed and up-to-date. Referrals have been placed accordingly. Immunizations are up-to-date or declined.    Subjective:  No chief complaint on file.  HPI Samuel Lawson 77 y.o. male presents to office today for follow up to prediabetes and HTN  He has a past medical history of Acute MI (2009), Collagen vascular disease, Hyperlipidemia, Hypertension, and Prediabetes     Notes floaters in right eye. No other vision changes.    Prediabetes Well controlled  with diet only at this time.  Lab Results  Component Value Date   HGBA1C 5.9 10/31/2022      HTN Well controlled. He does endorse compliance taking coreg 3.125 mg BID. He continues to smoke up to 1ppd. UTD with lung ct screening. BP Readings from Last 3 Encounters:  10/31/22 108/62  10/24/22 96/60  07/30/22 115/71    Tinnitus: Patient presents with tinnitus. Onset of symptoms was gradual starting several weeks ago ago with unchanged course since that time. Patient describes the tinnitus as constant located in the left ear. The quality is described as medium range pitch that sounds like roaring and seashore/waves. The pattern is nonpulsatile with an intensity that is medium. Patient describes his level of annoyance as minimally annoying, always aware. Associated symptoms include hearing loss Family history is negative family history for tinnitus Patient has had no prior evaluation, treatment or surgery for tinnitus Patient does not have hearing aids at this time. Previous treatments include none.    Review of Systems  Constitutional:  Negative for fever, malaise/fatigue and weight loss.  HENT:  Positive for tinnitus. Negative for nosebleeds.   Eyes:  Negative for blurred vision, double vision and photophobia.       SEE HPI  Respiratory:  Positive for cough and sputum production. Negative for hemoptysis, shortness of breath and  wheezing.        Heavy smoker  Cardiovascular: Negative.  Negative for chest pain, palpitations and leg swelling.  Gastrointestinal: Negative.  Negative for heartburn, nausea and vomiting.  Musculoskeletal: Negative.  Negative for myalgias.  Neurological: Negative.  Negative for dizziness, focal weakness, seizures and headaches.  Psychiatric/Behavioral: Negative.  Negative for suicidal ideas.     Past Medical History:  Diagnosis Date   Acute MI [facility]) 2009   STENTS PLACED   Chronic kidney disease    Collagen vascular disease ([place])    Hyperlipidemia     Hypertension    Pre-diabetes    Prediabetes     Past Surgical History:  Procedure Laterality Date   ANGIOPLASTY     CARDIAC CATHETERIZATION     INCISIONAL HERNIA REPAIR N/A 05/02/2022   Procedure: LAPAROSCOPIC INCISIONAL HERNIA REPAIR WITH MESH;  Surgeon: Coralie Keens, MD;  Location: [facility];  Service: General;  Laterality: N/A;   ROBOTIC ASSITED PARTIAL NEPHRECTOMY Left 12/05/2021   Procedure: XI ROBOTIC ASSITED PARTIAL NEPHRECTOMY;  Surgeon: Ardis Hughs, MD;  Location: [facility];  Service: Urology;  Laterality: Left;  3.5 HRS    Family History  Problem Relation Age of Onset   Heart Problems Mother    Stroke Father    Alzheimer's disease Father    High blood pressure Father    Colon cancer Neg Hx    Colon polyps Neg Hx    Esophageal cancer Neg Hx    Rectal cancer Neg Hx    Stomach cancer Neg Hx     Social History Reviewed with no changes to be made today.   Outpatient Medications Prior to Visit  Medication Sig Dispense Refill   aspirin EC 81 MG tablet Take 81 mg by mouth at bedtime. Swallow whole.     Blood Glucose Calibration (ACCU-CHEK GUIDE CONTROL) LIQD 1 each by In Vitro route once as needed for up to 1 dose. R73.03 1 each 0   Cholecalciferol (VITAMIN D3) 125 MCG (5000 UT) CAPS Take 5,000 Units by mouth daily. When compliant     fluticasone (FLONASE) 50 MCG/ACT nasal spray Place 2 sprays into both nostrils daily. 16 g 1   glucose blood (ACCU-CHEK GUIDE) test strip Use as instructed. Check blood glucose by fingerstick once per day. R73.03 100 each 0   Multiple Vitamins-Minerals (MULTIVITAMIN WITH MINERALS) tablet Take 1 tablet by mouth daily.     omega-3 acid ethyl esters (LOVAZA) 1 g capsule Take 1 capsule (1 g total) by mouth 2 (two) times daily. 180 capsule 1   ondansetron (ZOFRAN-ODT) 8 MG disintegrating tablet Take 8 mg by mouth every 8 (eight) hours as needed.     oxyCODONE (OXY IR/ROXICODONE) 5 MG immediate release tablet Take 1 tablet (5 mg total) by mouth  every 6 (six) hours as needed for moderate pain or severe pain. 25 tablet 0   senna-docusate (SENOKOT-S) 8.6-50 MG tablet Take 1 tablet by mouth daily. For constipation 100 tablet 1   tamsulosin (FLOMAX) 0.4 MG CAPS capsule Take 2 capsules by mouth once daily. (Patient taking differently: Take 0.4 mg by mouth daily.) 60 capsule 11   albuterol (VENTOLIN HFA) 108 (90 Base) MCG/ACT inhaler INHALE 1-2 PUFFS INTO THE LUNGS EVERY 6 (SIX) HOURS AS NEEDED FOR WHEEZING OR SHORTNESS OF BREATH. OR PERSISTENT COUGH 18 g 0   atorvastatin (LIPITOR) 40 MG tablet Take 1 tablet (40 mg total) by mouth at bedtime. 90 tablet 0   carvedilol (COREG) 3.125 MG tablet Take  1 tablet (3.125 mg total) by mouth 2 (two) times daily with a meal. 180 tablet 1   Facility-Administered Medications Prior to Visit  Medication Dose Route Frequency Provider Last Rate Last Admin   0.9 %  sodium chloride infusion  500 mL Intravenous Continuous Armbruster, Carlota Raspberry, MD        No Known Allergies     Objective:    BP 108/62 (BP Location: Left Arm, Patient Position: Sitting, Cuff Size: Normal)   Pulse (!) 55   Temp 98.2 F (36.8 C) (Oral)   Resp 20   Ht 5' 5"$  (1.651 m)   Wt 184 lb (83.5 kg)   SpO2 100%   BMI 30.62 kg/m  Wt Readings from Last 3 Encounters:  10/31/22 184 lb (83.5 kg)  10/24/22 175 lb (79.4 kg)  10/04/22 175 lb (79.4 kg)    Physical Exam Vitals and nursing note reviewed.  Constitutional:      Appearance: He is well-developed.  HENT:     Head: Normocephalic and atraumatic.     Right Ear: There is impacted cerumen.     Left Ear: Tympanic membrane, ear canal and external ear normal. Decreased hearing noted.  Cardiovascular:     Rate and Rhythm: Normal rate and regular rhythm.     Heart sounds: Normal heart sounds. No murmur heard.    No friction rub. No gallop.  Pulmonary:     Effort: Pulmonary effort is normal. No tachypnea or respiratory distress.     Breath sounds: Normal breath sounds. No decreased  breath sounds, wheezing, rhonchi or rales.  Chest:     Chest wall: No tenderness.  Abdominal:     General: Bowel sounds are normal.     Palpations: Abdomen is soft.  Musculoskeletal:        General: Normal range of motion.     Cervical back: Normal range of motion.  Skin:    General: Skin is warm and dry.  Neurological:     Mental Status: He is alert and oriented to person, place, and time.     Coordination: Coordination normal.  Psychiatric:        Behavior: Behavior normal. Behavior is cooperative.        Thought Content: Thought content normal.        Judgment: Judgment normal.          Patient has been counseled extensively about nutrition and exercise as well as the importance of adherence with medications and regular follow-up. The patient was given clear instructions to go to ER or return to medical center if symptoms don't improve, worsen or new problems develop. The patient verbalized understanding.   Follow-up: Return in about 3 months (around 01/29/2023).   Gildardo Pounds, FNP-BC [facility] and [facility] [place], [place]   10/31/2022, 1:41 PM

## 2022-11-01 ENCOUNTER — Other Ambulatory Visit: Payer: Self-pay

## 2022-11-01 ENCOUNTER — Other Ambulatory Visit: Payer: Self-pay | Admitting: Surgery

## 2022-11-01 DIAGNOSIS — K4090 Unilateral inguinal hernia, without obstruction or gangrene, not specified as recurrent: Secondary | ICD-10-CM | POA: Diagnosis not present

## 2022-11-01 LAB — CBC WITH DIFFERENTIAL/PLATELET
Basophils Absolute: 0.1 10*3/uL (ref 0.0–0.2)
Basos: 1 %
EOS (ABSOLUTE): 0.1 10*3/uL (ref 0.0–0.4)
Eos: 2 %
Hematocrit: 45.6 % (ref 37.5–51.0)
Hemoglobin: 15 g/dL (ref 13.0–17.7)
Immature Grans (Abs): 0 10*3/uL (ref 0.0–0.1)
Immature Granulocytes: 0 %
Lymphocytes Absolute: 1.2 10*3/uL (ref 0.7–3.1)
Lymphs: 18 %
MCH: 31.1 pg (ref 26.6–33.0)
MCHC: 32.9 g/dL (ref 31.5–35.7)
MCV: 94 fL (ref 79–97)
Monocytes Absolute: 0.6 10*3/uL (ref 0.1–0.9)
Monocytes: 10 %
Neutrophils Absolute: 4.6 10*3/uL (ref 1.4–7.0)
Neutrophils: 69 %
Platelets: 127 10*3/uL — ABNORMAL LOW (ref 150–450)
RBC: 4.83 x10E6/uL (ref 4.14–5.80)
RDW: 12.4 % (ref 11.6–15.4)
WBC: 6.7 10*3/uL (ref 3.4–10.8)

## 2022-11-01 LAB — CMP14+EGFR
ALT: 10 IU/L (ref 0–44)
AST: 18 IU/L (ref 0–40)
Albumin/Globulin Ratio: 1.9 (ref 1.2–2.2)
Albumin: 4.5 g/dL (ref 3.8–4.8)
Alkaline Phosphatase: 73 IU/L (ref 44–121)
BUN/Creatinine Ratio: 13 (ref 10–24)
BUN: 14 mg/dL (ref 8–27)
Bilirubin Total: 0.8 mg/dL (ref 0.0–1.2)
CO2: 24 mmol/L (ref 20–29)
Calcium: 9.6 mg/dL (ref 8.6–10.2)
Chloride: 103 mmol/L (ref 96–106)
Creatinine, Ser: 1.07 mg/dL (ref 0.76–1.27)
Globulin, Total: 2.4 g/dL (ref 1.5–4.5)
Glucose: 104 mg/dL — ABNORMAL HIGH (ref 70–99)
Potassium: 4.5 mmol/L (ref 3.5–5.2)
Sodium: 142 mmol/L (ref 134–144)
Total Protein: 6.9 g/dL (ref 6.0–8.5)
eGFR: 73 mL/min/{1.73_m2} (ref >59)

## 2022-11-01 LAB — LIPID PANEL
Chol/HDL Ratio: 2.3 ratio (ref 0.0–5.0)
Cholesterol, Total: 138 mg/dL (ref 100–199)
HDL: 61 mg/dL (ref >39)
LDL Chol Calc (NIH): 64 mg/dL (ref 0–99)
Triglycerides: 60 mg/dL (ref 0–149)
VLDL Cholesterol Cal: 13 mg/dL (ref 5–40)

## 2022-11-05 ENCOUNTER — Telehealth: Payer: Self-pay

## 2022-11-05 NOTE — Telephone Encounter (Signed)
Pt given lab results per notes of Geryl Rankins, NP on 11/05/22. Pt verbalized understanding.    Gildardo Pounds, NP 11/05/2022  8:25 AM EST     Kidney, liver function and electrolytes are normal.     Normal cholesterol levels at this time.   CBC does not indicate any significant anemia or bleeding disorder.  Platelets stable

## 2022-11-11 ENCOUNTER — Other Ambulatory Visit: Payer: Self-pay

## 2022-11-11 MED ORDER — TAMSULOSIN HCL 0.4 MG PO CAPS
0.8000 mg | ORAL_CAPSULE | Freq: Every day | ORAL | 11 refills | Status: DC
  Filled 2022-11-11: qty 60, 30d supply, fill #0
  Filled 2022-12-14: qty 60, 30d supply, fill #1
  Filled 2023-01-16: qty 60, 30d supply, fill #2

## 2022-11-14 ENCOUNTER — Other Ambulatory Visit: Payer: Self-pay

## 2022-11-29 ENCOUNTER — Other Ambulatory Visit: Payer: Self-pay

## 2022-12-02 ENCOUNTER — Other Ambulatory Visit: Payer: Self-pay

## 2022-12-12 ENCOUNTER — Other Ambulatory Visit: Payer: Self-pay

## 2022-12-13 ENCOUNTER — Other Ambulatory Visit: Payer: Self-pay

## 2022-12-16 ENCOUNTER — Other Ambulatory Visit: Payer: Self-pay

## 2022-12-30 ENCOUNTER — Other Ambulatory Visit: Payer: Self-pay

## 2022-12-31 ENCOUNTER — Other Ambulatory Visit: Payer: Self-pay

## 2023-01-17 ENCOUNTER — Other Ambulatory Visit: Payer: Self-pay | Admitting: Nurse Practitioner

## 2023-01-17 ENCOUNTER — Other Ambulatory Visit: Payer: Self-pay

## 2023-01-17 NOTE — Telephone Encounter (Signed)
Requested medications are due for refill today.  no  Requested medications are on the active medications list.  yes  Last refill. 11/11/2022 #60 11 rf  Future visit scheduled.   yes  Notes to clinic.  Pt is requesting 90 day supply.    Requested Prescriptions  Pending Prescriptions Disp Refills   tamsulosin (FLOMAX) 0.4 MG CAPS capsule 60 capsule 11    Sig: Take 2 capsules (0.8 mg total) by mouth daily.     Urology: Alpha-Adrenergic Blocker Failed - 01/17/2023  5:07 PM      Failed - PSA in normal range and within 360 days    PSA  Date Value Ref Range Status  07/19/2015 1.09 <=4.00 ng/mL Final    Comment:    Test Methodology: ECLIA PSA (Electrochemiluminescence Immunoassay)   For PSA values from 2.5-4.0, particularly in younger men <65 years old, the AUA and NCCN suggest testing for % Free PSA (3515) and evaluation of the rate of increase in PSA (PSA velocity).    Prostate Specific Ag, Serum  Date Value Ref Range Status  04/07/2020 1.3 0.0 - 4.0 ng/mL Final    Comment:    Roche ECLIA methodology. According to the American Urological Association, Serum PSA should decrease and remain at undetectable levels after radical prostatectomy. The AUA defines biochemical recurrence as an initial PSA value 0.2 ng/mL or greater followed by a subsequent confirmatory PSA value 0.2 ng/mL or greater. Values obtained with different assay methods or kits cannot be used interchangeably. Results cannot be interpreted as absolute evidence of the presence or absence of malignant disease.          Passed - Last BP in normal range    BP Readings from Last 1 Encounters:  10/31/22 108/62         Passed - Valid encounter within last 12 months    Recent Outpatient Visits           2 months ago Prediabetes   [facility], Shea Stakes, NP   5 months ago Nocturnal muscle spasm   [facility], Shea Stakes, NP   10  months ago Other tobacco product nicotine dependence, uncomplicated   [facility] Hoy Register, MD   1 year ago Upper respiratory tract infection, unspecified type   [facility], Gildardo Pounds, NP   1 year ago Prediabetes   [facility] Claiborne Rigg, NP       Future Appointments             In 1 week Claiborne Rigg, NP [facility]

## 2023-01-20 ENCOUNTER — Other Ambulatory Visit: Payer: Self-pay

## 2023-01-20 MED ORDER — TAMSULOSIN HCL 0.4 MG PO CAPS: 0.8000 mg | ORAL_CAPSULE | Freq: Every day | ORAL | 0 refills | Status: DC

## 2023-01-21 ENCOUNTER — Other Ambulatory Visit: Payer: Self-pay

## 2023-01-29 ENCOUNTER — Encounter: Payer: Self-pay | Admitting: Nurse Practitioner

## 2023-01-29 ENCOUNTER — Ambulatory Visit: Payer: Medicare HMO | Attending: Nurse Practitioner | Admitting: Nurse Practitioner

## 2023-01-29 ENCOUNTER — Other Ambulatory Visit: Payer: Self-pay

## 2023-01-29 VITALS — BP 112/70 | HR 56 | Ht 65.0 in | Wt 182.8 lb

## 2023-01-29 DIAGNOSIS — R7303 Prediabetes: Secondary | ICD-10-CM

## 2023-01-29 DIAGNOSIS — I10 Essential (primary) hypertension: Secondary | ICD-10-CM

## 2023-01-29 DIAGNOSIS — D696 Thrombocytopenia, unspecified: Secondary | ICD-10-CM | POA: Diagnosis not present

## 2023-01-29 DIAGNOSIS — N400 Enlarged prostate without lower urinary tract symptoms: Secondary | ICD-10-CM | POA: Diagnosis not present

## 2023-01-29 DIAGNOSIS — I252 Old myocardial infarction: Secondary | ICD-10-CM

## 2023-01-29 MED ORDER — TAMSULOSIN HCL 0.4 MG PO CAPS
0.8000 mg | ORAL_CAPSULE | Freq: Every day | ORAL | 1 refills | Status: DC
  Filled 2023-01-29 – 2023-02-16 (×2): qty 180, 90d supply, fill #0
  Filled 2023-05-18: qty 180, 90d supply, fill #1

## 2023-01-29 NOTE — Patient Instructions (Addendum)
 [facility]  ph. # [phone] Fax# [phone] address  [address]  Sent Referral to [facility]  ph # [phone]  Address [address]    Dr. Jodelle Red [address]   [phone]

## 2023-01-29 NOTE — Progress Notes (Signed)
Assessment & Plan:  Samuel Lawson was seen today for hypertension.  Diagnoses and all orders for this visit:  Primary hypertension Well-controlled -     CMP14+EGFR Continue all antihypertensives as prescribed.  Reminded to bring in blood pressure log for follow  up appointment.  RECOMMENDATIONS: DASH/Mediterranean Diets are healthier choices for HTN.    Prediabetes -     Hemoglobin A1c  Thrombocytopenia (HCC) Platelets counts are chronic and stable -     CBC with Differential  Benign prostatic hyperplasia, unspecified whether lower urinary tract symptoms present -     tamsulosin (FLOMAX) 0.4 MG CAPS capsule; Take 2 capsules (0.8 mg total) by mouth daily.  History of MI (myocardial infarction) Continue Plavix. Encouraged to quit smoking.   Patient has been counseled on age-appropriate routine health concerns for screening and prevention. These are reviewed and up-to-date. Referrals have been placed accordingly. Immunizations are up-to-date or declined.    Subjective:   Chief Complaint  Patient presents with   Hypertension   HPI Samuel Lawson 77 y.o. male presents to office today for HTN  He has a past medical history of Acute MI (2009), Collagen vascular disease, Hyperlipidemia, Hypertension, and Prediabetes    Reports not hearing back from the ENT specialist or ophthalmology.  He was given both number today to call and schedule an appointment.  Notes floaters in right eye.  He has chronic tinnitus of both ears with left greater than right.  HTN Well controlled. He does endorse adherence taking coreg 3.125 mg BID. He continues to smoke up to 1ppd. UTD with lung ct screening BP Readings from Last 3 Encounters:  01/29/23 112/70  10/31/22 108/62  10/24/22 96/60    Prediabetes Well controlled with diet only at this time.  Weight is down 2 pounds since last visit Lab Results  Component Value Date   HGBA1C 5.9 10/31/2022     Review of Systems  Constitutional:   Negative for fever, malaise/fatigue and weight loss.  HENT:  Positive for tinnitus. Negative for nosebleeds.   Eyes:  Positive for blurred vision. Negative for double vision and photophobia.  Respiratory: Negative.  Negative for cough and shortness of breath.   Cardiovascular: Negative.  Negative for chest pain, palpitations and leg swelling.  Gastrointestinal: Negative.  Negative for heartburn, nausea and vomiting.  Musculoskeletal: Negative.  Negative for myalgias.  Neurological: Negative.  Negative for dizziness, focal weakness, seizures and headaches.  Psychiatric/Behavioral: Negative.  Negative for suicidal ideas.     Past Medical History:  Diagnosis Date   Acute MI [facility]) 2009   STENTS PLACED   Chronic kidney disease    Collagen vascular disease (HCC)    Hyperlipidemia    Hypertension    Pre-diabetes    Prediabetes     Past Surgical History:  Procedure Laterality Date   ANGIOPLASTY     CARDIAC CATHETERIZATION     INCISIONAL HERNIA REPAIR N/A 05/02/2022   Procedure: LAPAROSCOPIC INCISIONAL HERNIA REPAIR WITH MESH;  Surgeon: Abigail Miyamoto, MD;  Location: [facility];  Service: General;  Laterality: N/A;   ROBOTIC ASSITED PARTIAL NEPHRECTOMY Left 12/05/2021   Procedure: XI ROBOTIC ASSITED PARTIAL NEPHRECTOMY;  Surgeon: Crist Fat, MD;  Location: [facility];  Service: Urology;  Laterality: Left;  3.5 HRS    Family History  Problem Relation Age of Onset   Heart Problems Mother    Stroke Father    Alzheimer's disease Father    High blood pressure Father    Colon cancer Neg Hx  Colon polyps Neg Hx    Esophageal cancer Neg Hx    Rectal cancer Neg Hx    Stomach cancer Neg Hx     Social History Reviewed with no changes to be made today.   Outpatient Medications Prior to Visit  Medication Sig Dispense Refill   albuterol (VENTOLIN HFA) 108 (90 Base) MCG/ACT inhaler Inhale 1-2 puffs into the lungs every 6 (six) hours as needed. 18 g 1   aspirin EC 81 MG tablet Take 81  mg by mouth at bedtime. Swallow whole.     atorvastatin (LIPITOR) 40 MG tablet Take 1 tablet (40 mg total) by mouth at bedtime. 90 tablet 3   carvedilol (COREG) 3.125 MG tablet Take 1 tablet (3.125 mg total) by mouth 2 (two) times daily with a meal. 180 tablet 1   Cholecalciferol (VITAMIN D3) 125 MCG (5000 UT) CAPS Take 5,000 Units by mouth daily. When compliant     clopidogrel (PLAVIX) 75 MG tablet Take 75 mg by mouth once.     diclofenac Sodium (VOLTAREN) 1 % GEL Apply 2 g topically 4 (four) times daily. For joint pain 200 g 0   fluticasone (FLONASE) 50 MCG/ACT nasal spray Place 2 sprays into both nostrils daily. 16 g 1   glucose blood (ACCU-CHEK GUIDE) test strip Use as instructed. Check blood glucose by fingerstick once per day. R73.03 100 each 0   Multiple Vitamins-Minerals (MULTIVITAMIN WITH MINERALS) tablet Take 1 tablet by mouth daily.     omega-3 acid ethyl esters (LOVAZA) 1 g capsule Take 1 capsule (1 g total) by mouth 2 (two) times daily. 180 capsule 1   senna-docusate (SENOKOT-S) 8.6-50 MG tablet Take 1 tablet by mouth daily. For constipation 100 tablet 1   tamsulosin (FLOMAX) 0.4 MG CAPS capsule Take 2 capsules (0.8 mg total) by mouth daily. 60 capsule 0   Blood Glucose Calibration (ACCU-CHEK GUIDE CONTROL) LIQD 1 each by In Vitro route once as needed for up to 1 dose. R73.03 (Patient not taking: Reported on 01/29/2023) 1 each 0   ondansetron (ZOFRAN-ODT) 8 MG disintegrating tablet Take 8 mg by mouth every 8 (eight) hours as needed. (Patient not taking: Reported on 01/29/2023)     oxyCODONE (OXY IR/ROXICODONE) 5 MG immediate release tablet Take 1 tablet (5 mg total) by mouth every 6 (six) hours as needed for moderate pain or severe pain. (Patient not taking: Reported on 01/29/2023) 25 tablet 0   Facility-Administered Medications Prior to Visit  Medication Dose Route Frequency Provider Last Rate Last Admin   0.9 %  sodium chloride infusion  500 mL Intravenous Continuous Armbruster, Willaim Rayas, MD        No Known Allergies     Objective:    BP 112/70 (BP Location: Left Arm, Patient Position: Sitting, Cuff Size: Normal)   Pulse (!) 56   Ht 5\' 5"  (1.651 m)   Wt 182 lb 12.8 oz (82.9 kg)   SpO2 96%   BMI 30.42 kg/m  Wt Readings from Last 3 Encounters:  01/29/23 182 lb 12.8 oz (82.9 kg)  10/31/22 184 lb (83.5 kg)  10/24/22 175 lb (79.4 kg)    Physical Exam Vitals and nursing note reviewed.  Constitutional:      Appearance: He is well-developed.  HENT:     Head: Normocephalic and atraumatic.  Cardiovascular:     Rate and Rhythm: Regular rhythm. Bradycardia present.     Heart sounds: Normal heart sounds. No murmur heard.    No friction rub.  No gallop.     Comments: Asymptomatic Pulmonary:     Effort: Pulmonary effort is normal. No tachypnea or respiratory distress.     Breath sounds: Normal breath sounds. No decreased breath sounds, wheezing, rhonchi or rales.  Chest:     Chest wall: No tenderness.  Abdominal:     General: Bowel sounds are normal.     Palpations: Abdomen is soft.  Musculoskeletal:        General: Normal range of motion.     Cervical back: Normal range of motion.  Skin:    General: Skin is warm and dry.  Neurological:     Mental Status: He is alert and oriented to person, place, and time.     Coordination: Coordination normal.  Psychiatric:        Behavior: Behavior normal. Behavior is cooperative.        Thought Content: Thought content normal.        Judgment: Judgment normal.          Patient has been counseled extensively about nutrition and exercise as well as the importance of adherence with medications and regular follow-up. The patient was given clear instructions to go to ER or return to medical center if symptoms don't improve, worsen or new problems develop. The patient verbalized understanding.   Follow-up: Return in about 3 months (around 05/01/2023).   Claiborne Rigg, FNP-BC [facility]  [place], [place] [phone]   01/29/2023, 1:06 PM

## 2023-01-30 LAB — CBC WITH DIFFERENTIAL/PLATELET
Basophils Absolute: 0 10*3/uL (ref 0.0–0.2)
Basos: 0 %
EOS (ABSOLUTE): 0.1 10*3/uL (ref 0.0–0.4)
Eos: 2 %
Hematocrit: 45.9 % (ref 37.5–51.0)
Hemoglobin: 15.3 g/dL (ref 13.0–17.7)
Immature Grans (Abs): 0 10*3/uL (ref 0.0–0.1)
Immature Granulocytes: 0 %
Lymphocytes Absolute: 1.3 10*3/uL (ref 0.7–3.1)
Lymphs: 18 %
MCH: 30.6 pg (ref 26.6–33.0)
MCHC: 33.3 g/dL (ref 31.5–35.7)
MCV: 92 fL (ref 79–97)
Monocytes Absolute: 0.7 10*3/uL (ref 0.1–0.9)
Monocytes: 10 %
Neutrophils Absolute: 4.9 10*3/uL (ref 1.4–7.0)
Neutrophils: 70 %
Platelets: 134 10*3/uL — ABNORMAL LOW (ref 150–450)
RBC: 5 x10E6/uL (ref 4.14–5.80)
RDW: 11.8 % (ref 11.6–15.4)
WBC: 7.1 10*3/uL (ref 3.4–10.8)

## 2023-01-30 LAB — CMP14+EGFR
ALT: 12 IU/L (ref 0–44)
AST: 18 IU/L (ref 0–40)
Albumin/Globulin Ratio: 1.8 (ref 1.2–2.2)
Albumin: 4.4 g/dL (ref 3.8–4.8)
Alkaline Phosphatase: 71 IU/L (ref 44–121)
BUN/Creatinine Ratio: 15 (ref 10–24)
BUN: 17 mg/dL (ref 8–27)
Bilirubin Total: 1.5 mg/dL — ABNORMAL HIGH (ref 0.0–1.2)
CO2: 21 mmol/L (ref 20–29)
Calcium: 9.6 mg/dL (ref 8.6–10.2)
Chloride: 104 mmol/L (ref 96–106)
Creatinine, Ser: 1.11 mg/dL (ref 0.76–1.27)
Globulin, Total: 2.5 g/dL (ref 1.5–4.5)
Glucose: 111 mg/dL — ABNORMAL HIGH (ref 70–99)
Potassium: 4.5 mmol/L (ref 3.5–5.2)
Sodium: 143 mmol/L (ref 134–144)
Total Protein: 6.9 g/dL (ref 6.0–8.5)
eGFR: 70 mL/min/{1.73_m2} (ref >59)

## 2023-01-30 LAB — HEMOGLOBIN A1C
Est. average glucose Bld gHb Est-mCnc: 126 mg/dL
Hgb A1c MFr Bld: 6 % — ABNORMAL HIGH (ref 4.8–5.6)

## 2023-02-17 ENCOUNTER — Other Ambulatory Visit: Payer: Self-pay

## 2023-03-11 ENCOUNTER — Other Ambulatory Visit: Payer: Self-pay

## 2023-03-12 DIAGNOSIS — C642 Malignant neoplasm of left kidney, except renal pelvis: Secondary | ICD-10-CM | POA: Diagnosis not present

## 2023-03-17 DIAGNOSIS — K5730 Diverticulosis of large intestine without perforation or abscess without bleeding: Secondary | ICD-10-CM | POA: Diagnosis not present

## 2023-03-17 DIAGNOSIS — K8020 Calculus of gallbladder without cholecystitis without obstruction: Secondary | ICD-10-CM | POA: Diagnosis not present

## 2023-03-17 DIAGNOSIS — I2510 Atherosclerotic heart disease of native coronary artery without angina pectoris: Secondary | ICD-10-CM | POA: Diagnosis not present

## 2023-03-17 DIAGNOSIS — C642 Malignant neoplasm of left kidney, except renal pelvis: Secondary | ICD-10-CM | POA: Diagnosis not present

## 2023-03-24 ENCOUNTER — Other Ambulatory Visit (HOSPITAL_BASED_OUTPATIENT_CLINIC_OR_DEPARTMENT_OTHER): Payer: Self-pay

## 2023-03-24 DIAGNOSIS — D49512 Neoplasm of unspecified behavior of left kidney: Secondary | ICD-10-CM | POA: Diagnosis not present

## 2023-03-24 DIAGNOSIS — R3912 Poor urinary stream: Secondary | ICD-10-CM | POA: Diagnosis not present

## 2023-03-24 MED ORDER — TAMSULOSIN HCL 0.4 MG PO CAPS
0.8000 mg | ORAL_CAPSULE | Freq: Every day | ORAL | 11 refills | Status: DC
  Filled 2023-03-24: qty 60, 30d supply, fill #0

## 2023-04-03 DIAGNOSIS — H43813 Vitreous degeneration, bilateral: Secondary | ICD-10-CM | POA: Diagnosis not present

## 2023-04-03 DIAGNOSIS — H353132 Nonexudative age-related macular degeneration, bilateral, intermediate dry stage: Secondary | ICD-10-CM | POA: Diagnosis not present

## 2023-04-03 DIAGNOSIS — H0102B Squamous blepharitis left eye, upper and lower eyelids: Secondary | ICD-10-CM | POA: Diagnosis not present

## 2023-04-03 DIAGNOSIS — H2513 Age-related nuclear cataract, bilateral: Secondary | ICD-10-CM | POA: Diagnosis not present

## 2023-04-03 DIAGNOSIS — H0102A Squamous blepharitis right eye, upper and lower eyelids: Secondary | ICD-10-CM | POA: Diagnosis not present

## 2023-04-04 ENCOUNTER — Other Ambulatory Visit: Payer: Self-pay

## 2023-04-22 ENCOUNTER — Ambulatory Visit: Payer: Medicare HMO | Attending: Nurse Practitioner

## 2023-04-22 ENCOUNTER — Other Ambulatory Visit: Payer: Self-pay | Admitting: Nurse Practitioner

## 2023-04-22 ENCOUNTER — Other Ambulatory Visit: Payer: Self-pay

## 2023-04-22 VITALS — Ht 65.0 in | Wt 182.0 lb

## 2023-04-22 DIAGNOSIS — Z0000 Encounter for general adult medical examination without abnormal findings: Secondary | ICD-10-CM

## 2023-04-22 MED ORDER — OMEGA-3-ACID ETHYL ESTERS 1 G PO CAPS
1.0000 g | ORAL_CAPSULE | Freq: Two times a day (BID) | ORAL | 0 refills | Status: DC
  Filled 2023-04-22: qty 180, 90d supply, fill #0

## 2023-04-22 NOTE — Progress Notes (Signed)
Subjective:   Samuel Lawson is a 77 y.o. male who presents for Medicare Annual/Subsequent preventive examination.  Visit Complete: Virtual  I connected with  [name] on 04/22/23 by a audio enabled telemedicine application and verified that I am speaking with the correct person using two identifiers.  Patient Location: Home  Provider Location: Home Office  I discussed the limitations of evaluation and management by telemedicine. The patient expressed understanding and agreed to proceed.  Vital Signs: Unable to obtain new vitals due to this being a telehealth visit.  Review of Systems     Cardiac Risk Factors include: advanced age (>31men, >32 women);dyslipidemia;hypertension;male gender;smoking/ tobacco exposure     Objective:    Today's Vitals   04/22/23 1846  Weight: 182 lb (82.6 kg)  Height: 5\' 5"  (1.651 m)   Body mass index is 30.29 kg/m.     04/22/2023    7:44 PM 08/30/2022    9:10 AM 05/02/2022    1:00 PM 04/29/2022    9:07 AM 04/10/2022    6:18 PM 12/05/2021    5:21 PM 12/05/2021    2:00 PM  Advanced Directives  Does Patient Have a Medical Advance Directive? No No No No No  No  Would patient like information on creating a medical advance directive? Yes (MAU/Ambulatory/Procedural Areas - Information given) Yes (ED - Information included in AVS) No - Patient declined   No - Patient declined     Current Medications (verified) Outpatient Encounter Medications as of 04/22/2023  Medication Sig   albuterol (VENTOLIN HFA) 108 (90 Base) MCG/ACT inhaler Inhale 1-2 puffs into the lungs every 6 (six) hours as needed.   aspirin EC 81 MG tablet Take 81 mg by mouth at bedtime. Swallow whole.   atorvastatin (LIPITOR) 40 MG tablet Take 1 tablet (40 mg total) by mouth at bedtime.   carvedilol (COREG) 3.125 MG tablet Take 1 tablet (3.125 mg total) by mouth 2 (two) times daily with a meal.   Cholecalciferol (VITAMIN D3) 125 MCG (5000 UT) CAPS Take 5,000 Units by mouth daily.  When compliant   clopidogrel (PLAVIX) 75 MG tablet Take 75 mg by mouth once.   diclofenac Sodium (VOLTAREN) 1 % GEL Apply 2 g topically 4 (four) times daily. For joint pain   fluticasone (FLONASE) 50 MCG/ACT nasal spray Place 2 sprays into both nostrils daily.   glucose blood (ACCU-CHEK GUIDE) test strip Use as instructed. Check blood glucose by fingerstick once per day. R73.03   Multiple Vitamins-Minerals (MULTIVITAMIN WITH MINERALS) tablet Take 1 tablet by mouth daily.   omega-3 acid ethyl esters (LOVAZA) 1 g capsule Take 1 capsule (1 g total) by mouth 2 (two) times daily.   senna-docusate (SENOKOT-S) 8.6-50 MG tablet Take 1 tablet by mouth daily. For constipation   tamsulosin (FLOMAX) 0.4 MG CAPS capsule Take 2 capsules (0.8 mg total) by mouth daily.   tamsulosin (FLOMAX) 0.4 MG CAPS capsule Take 2 capsules (0.8 mg total) by mouth daily.   Blood Glucose Calibration (ACCU-CHEK GUIDE CONTROL) LIQD 1 each by In Vitro route once as needed for up to 1 dose. R73.03 (Patient not taking: Reported on 01/29/2023)   ondansetron (ZOFRAN-ODT) 8 MG disintegrating tablet Take 8 mg by mouth every 8 (eight) hours as needed. (Patient not taking: Reported on 01/29/2023)   [DISCONTINUED] omega-3 acid ethyl esters (LOVAZA) 1 g capsule Take 1 capsule (1 g total) by mouth 2 (two) times daily.   [DISCONTINUED] promethazine (PHENERGAN) 25 MG tablet Take 1 tablet (25 mg  total) by mouth every 6 (six) hours as needed for nausea.   Facility-Administered Encounter Medications as of 04/22/2023  Medication   0.9 %  sodium chloride infusion    Allergies (verified) Patient has no known allergies.   History: Past Medical History:  Diagnosis Date   Acute MI (HCC) 2009   STENTS PLACED   Chronic kidney disease    Collagen vascular disease (HCC)    Hyperlipidemia    Hypertension    Pre-diabetes    Prediabetes    Past Surgical History:  Procedure Laterality Date   ANGIOPLASTY     CARDIAC CATHETERIZATION      INCISIONAL HERNIA REPAIR N/A 05/02/2022   Procedure: LAPAROSCOPIC INCISIONAL HERNIA REPAIR WITH MESH;  Surgeon: Abigail Miyamoto, MD;  Location: [facility];  Service: General;  Laterality: N/A;   ROBOTIC ASSITED PARTIAL NEPHRECTOMY Left 12/05/2021   Procedure: XI ROBOTIC ASSITED PARTIAL NEPHRECTOMY;  Surgeon: Crist Fat, MD;  Location: [facility];  Service: Urology;  Laterality: Left;  3.5 HRS   Family History  Problem Relation Age of Onset   Heart Problems Mother    Stroke Father    Alzheimer's disease Father    High blood pressure Father    Colon cancer Neg Hx    Colon polyps Neg Hx    Esophageal cancer Neg Hx    Rectal cancer Neg Hx    Stomach cancer Neg Hx    Social History   Socioeconomic History   Marital status: Single    Spouse name: Not on file   Number of children: 2   Years of education: B.S.   Highest education level: Not on file  Occupational History    Employer: SELF EMPLOYED    Comment: Computers - Works for [employer]  Tobacco Use   Smoking status: Every Day    Current packs/day: 1.00    Average packs/day: 1 pack/day for 50.6 years (50.6 ttl pk-yrs)    Types: Cigarettes    Start date: 09/09/1972   Smokeless tobacco: Never   Tobacco comments:    states new years resolution is to quit smoking  Vaping Use   Vaping status: Never Used  Substance and Sexual Activity   Alcohol use: No   Drug use: No   Sexual activity: Not on file  Other Topics Concern   Not on file  Social History Narrative   Patient lives at home alone and he is single. Reports being married in the past. Patient is self employed and works with computers.   Education B.S.   Right handed.   Caffeine one cup of tea daily and two pepsi daily.   Social Determinants of Health   Financial Resource Strain: Low Risk  (04/22/2023)   Overall Financial Resource Strain (CARDIA)    Difficulty of Paying Living Expenses: Not hard at all  Food Insecurity: No Food Insecurity (04/22/2023)   Hunger Vital Sign     Worried About Running Out of Food in the Last Year: Never true    Ran Out of Food in the Last Year: Never true  Transportation Needs: No Transportation Needs (04/22/2023)   PRAPARE - Administrator, Civil Service (Medical): No    Lack of Transportation (Non-Medical): No  Physical Activity: Insufficiently Active (04/22/2023)   Exercise Vital Sign    Days of Exercise per Week: 2 days    Minutes of Exercise per Session: 30 min  Stress: No Stress Concern Present (08/30/2022)   [employer] of Occupational Health - Occupational  Stress Questionnaire    Feeling of Stress : Only a little  Social Connections: Moderately Isolated (04/22/2023)   Social Connection and Isolation Panel [NHANES]    Frequency of Communication with Friends and Family: Once a week    Frequency of Social Gatherings with Friends and Family: Three times a week    Attends Religious Services: Never    Active Member of Clubs or Organizations: Yes    Attends Banker Meetings: Never    Marital Status: Divorced    Tobacco Counseling Ready to quit: Not Answered Counseling given: Not Answered Tobacco comments: states new years resolution is to quit smoking   Clinical Intake:  Pre-visit preparation completed: Yes  Pain : No/denies pain     Diabetes: No  How often do you need to have someone help you when you read instructions, pamphlets, or other written materials from your doctor or pharmacy?: 1 - Never  Interpreter Needed?: No  Information entered by :: Kandis Fantasia LPN   Activities of Daily Living    04/22/2023    6:50 PM 08/30/2022    9:19 AM  In your present state of health, do you have any difficulty performing the following activities:  Hearing? 0 1  Vision? 0 0  Difficulty concentrating or making decisions? 0 1  Walking or climbing stairs? 0 1  Dressing or bathing? 0 0  Doing errands, shopping? 0 0  Preparing Food and eating ? N N  Using the Toilet? N N  In the  past six months, have you accidently leaked urine? N Y  Do you have problems with loss of bowel control? N Y  Managing your Medications? N N  Managing your Finances? N N  Housekeeping or managing your Housekeeping? N N    Patient Care Team: Claiborne Rigg, NP as PCP - General (Nurse Practitioner) Jodelle Red, MD as PCP - Cardiology (Cardiology) Crist Fat, MD as Attending Physician (Urology)  Indicate any recent Medical Services you may have received from other than [facility] providers in the past year (date may be approximate).     Assessment:   This is a routine wellness examination for Dezmond.  Hearing/Vision screen Hearing Screening - Comments:: Denies hearing difficulties   Vision Screening - Comments:: No vision problems; will schedule routine eye exam soon    Dietary issues and exercise activities discussed:     Goals Addressed   None   Depression Screen    01/29/2023    9:55 AM 10/31/2022   10:19 AM 08/30/2022    9:11 AM 03/07/2022   11:30 AM 08/08/2021   10:00 AM 01/22/2021    4:36 PM 07/24/2020    9:51 AM  PHQ 2/9 Scores  PHQ - 2 Score 0 0 0 0 0 2 0  PHQ- 9 Score 3 1  2 3 5 2     Fall Risk    04/22/2023    7:44 PM 01/29/2023    9:49 AM 10/31/2022   10:19 AM 08/30/2022    9:11 AM 07/30/2022    2:41 PM  Fall Risk   Falls in the past year? 0 0 0 0 0  Number falls in past yr: 0 0 0 0 0  Injury with Fall? 0 0 0 0 0  Risk for fall due to : No Fall Risks No Fall Risks No Fall Risks No Fall Risks   Follow up Falls prevention discussed;Education provided;Falls evaluation completed Falls evaluation completed Follow up appointment  Falls evaluation completed  MEDICARE RISK AT HOME:  Medicare Risk at Home - 04/22/23 1944     Any stairs in or around the home? No    If so, are there any without handrails? No    Home free of loose throw rugs in walkways, pet beds, electrical cords, etc? Yes    Adequate lighting in your home to reduce risk of  falls? Yes    Life alert? No    Use of a cane, walker or w/c? No    Grab bars in the bathroom? No    Shower chair or bench in shower? No    Elevated toilet seat or a handicapped toilet? No             TIMED UP AND GO:  Was the test performed?  No    Cognitive Function:    08/30/2022    9:12 AM 08/08/2021   10:21 AM 04/09/2018    2:23 PM  MMSE - Mini Mental State Exam  Orientation to time 5 5 5   Orientation to Place 5 5 5   Registration 3 3 3   Attention/ Calculation 5 5 4   Recall 3 3 2   Language- name 2 objects 2 2 2   Language- repeat 1 1 1   Language- follow 3 step command 3 3 3   Language- read & follow direction 1 1 1   Write a sentence 1 1 1   Copy design 1 0 1  Total score 30 29 28       01/02/2022   11:25 AM  Montreal Cognitive Assessment   Visuospatial/ Executive (0/5) 5  Naming (0/3) 2  Attention: Read list of digits (0/2) 1  Attention: Read list of letters (0/1) 1  Attention: Serial 7 subtraction starting at 100 (0/3) 3  Language: Repeat phrase (0/2) 1  Language : Fluency (0/1) 0  Abstraction (0/2) 2  Delayed Recall (0/5) 1  Orientation (0/6) 6  Total 22  Adjusted Score (based on education) 22      04/22/2023    7:44 PM 08/30/2022    9:13 AM  6CIT Screen  What Year? 0 points 0 points  What month? 0 points 0 points  What time? 0 points 0 points  Count back from 20 0 points 0 points  Months in reverse 0 points 0 points  Repeat phrase 0 points 0 points  Total Score 0 points 0 points    Immunizations Immunization History  Administered Date(s) Administered   Influenza,inj,Quad PF,6+ Mos 08/25/2013, 06/01/2014, 06/22/2015   PFIZER(Purple Top)SARS-COV-2 Vaccination 09/21/2020    TDAP status: Due, Education has been provided regarding the importance of this vaccine. Advised may receive this vaccine at local pharmacy or Health Dept. Aware to provide a copy of the vaccination record if obtained from local pharmacy or Health Dept. Verbalized acceptance  and understanding.  Flu Vaccine status: Declined, Education has been provided regarding the importance of this vaccine but patient still declined. Advised may receive this vaccine at local pharmacy or Health Dept. Aware to provide a copy of the vaccination record if obtained from local pharmacy or Health Dept. Verbalized acceptance and understanding.  Pneumococcal vaccine status: Declined,  Education has been provided regarding the importance of this vaccine but patient still declined. Advised may receive this vaccine at local pharmacy or Health Dept. Aware to provide a copy of the vaccination record if obtained from local pharmacy or Health Dept. Verbalized acceptance and understanding.   Covid-19 vaccine status: Declined, Education has been provided regarding the importance of this vaccine but  patient still declined. Advised may receive this vaccine at local pharmacy or Health Dept.or vaccine clinic. Aware to provide a copy of the vaccination record if obtained from local pharmacy or Health Dept. Verbalized acceptance and understanding.  Qualifies for Shingles Vaccine? Yes   Zostavax completed No   Shingrix Completed?: No.    Education has been provided regarding the importance of this vaccine. Patient has been advised to call insurance company to determine out of pocket expense if they have not yet received this vaccine. Advised may also receive vaccine at local pharmacy or Health Dept. Verbalized acceptance and understanding.  Screening Tests Health Maintenance  Topic Date Due   COVID-19 Vaccine (2 - Pfizer risk series) 10/12/2020   INFLUENZA VACCINE  04/10/2023   Zoster Vaccines- Shingrix (1 of 2) 05/01/2023 (Originally 03/20/1965)   Pneumonia Vaccine 48+ Years old (1 of 2 - PCV) 07/31/2023 (Originally 03/19/1952)   Lung Cancer Screening  09/11/2023   COLON CANCER SCREENING ANNUAL FOBT  10/25/2023   Medicare Annual Wellness (AWV)  04/21/2024   Hepatitis C Screening  Completed   HPV VACCINES   Aged Out   DTaP/Tdap/Td  Discontinued   Colonoscopy  Discontinued    Health Maintenance  Health Maintenance Due  Topic Date Due   COVID-19 Vaccine (2 - Pfizer risk series) 10/12/2020   INFLUENZA VACCINE  04/10/2023    Colorectal cancer screening: Type of screening: Colonoscopy. Completed 10/24/22. Repeat every - years  Lung Cancer Screening: (Low Dose CT Chest recommended if Age 74-80 years, 20 pack-year currently smoking OR have quit w/in 15years.) does qualify.   Lung Cancer Screening Referral: last 09/10/22  Additional Screening:  Hepatitis C Screening: does qualify; Completed 01/22/16  Vision Screening: Recommended annual ophthalmology exams for early detection of glaucoma and other disorders of the eye. Is the patient up to date with their annual eye exam?  No  Who is the provider or what is the name of the office in which the patient attends annual eye exams? none If pt is not established with a provider, would they like to be referred to a provider to establish care? No .   Dental Screening: Recommended annual dental exams for proper oral hygiene  Community Resource Referral / Chronic Care Management: CRR required this visit?  No   CCM required this visit?  No     Plan:     I have personally reviewed and noted the following in the patient's chart:   Medical and social history Use of alcohol, tobacco or illicit drugs  Current medications and supplements including opioid prescriptions. Patient is not currently taking opioid prescriptions. Functional ability and status Nutritional status Physical activity Advanced directives List of other physicians Hospitalizations, surgeries, and ER visits in previous 12 months Vitals Screenings to include cognitive, depression, and falls Referrals and appointments  In addition, I have reviewed and discussed with patient certain preventive protocols, quality metrics, and best practice recommendations. A written personalized  care plan for preventive services as well as general preventive health recommendations were provided to patient.     Kandis Fantasia [employer], [place]   1/61/0960   After Visit Summary: (MyChart) Due to this being a telephonic visit, the after visit summary with patients personalized plan was offered to patient via MyChart   Nurse Notes: Patient is requesting an rx for Nicotine lozenges 2mg

## 2023-04-22 NOTE — Patient Instructions (Signed)
[name] , Thank you for taking time to come for your Medicare Wellness Visit. I appreciate your ongoing commitment to your health goals. Please review the following plan we discussed and let me know if I can assist you in the future.   Referrals/Orders/Follow-Ups/Clinician Recommendations: Aim for 30 minutes of exercise or brisk walking, 6-8 glasses of water, and 5 servings of fruits and vegetables each day.  This is a list of the screening recommended for you and due dates:  Health Maintenance  Topic Date Due   COVID-19 Vaccine (2 - Pfizer risk series) 10/12/2020   Flu Shot  04/10/2023   Zoster (Shingles) Vaccine (1 of 2) 05/01/2023*   Pneumonia Vaccine (1 of 2 - PCV) 07/31/2023*   Screening for Lung Cancer  09/11/2023   Stool Blood Test  10/25/2023   Medicare Annual Wellness Visit  04/21/2024   Hepatitis C Screening  Completed   HPV Vaccine  Aged Out   DTaP/Tdap/Td vaccine  Discontinued   Colon Cancer Screening  Discontinued  *Topic was postponed. The date shown is not the original due date.    Advanced directives: (ACP Link)Information on Advanced Care Planning can be found at [facility] Advance Health Care Directives Advance Health Care Directives ([email or web address]   Next Medicare Annual Wellness Visit scheduled for next year: Yes  Preventive Care 65 Years and Older, Male  Preventive care refers to lifestyle choices and visits with your health care provider that can promote health and wellness. What does preventive care include? A yearly physical exam. This is also called an annual well check. Dental exams once or twice a year. Routine eye exams. Ask your health care provider how often you should have your eyes checked. Personal lifestyle choices, including: Daily care of your teeth and gums. Regular physical activity. Eating a healthy diet. Avoiding tobacco and drug use. Limiting alcohol use. Practicing safe sex. Taking low doses of aspirin every  day. Taking vitamin and mineral supplements as recommended by your health care provider. What happens during an annual well check? The services and screenings done by your health care provider during your annual well check will depend on your age, overall health, lifestyle risk factors, and family history of disease. Counseling  Your health care provider may ask you questions about your: Alcohol use. Tobacco use. Drug use. Emotional well-being. Home and relationship well-being. Sexual activity. Eating habits. History of falls. Memory and ability to understand (cognition). Work and work Astronomer. Screening  You may have the following tests or measurements: Height, weight, and BMI. Blood pressure. Lipid and cholesterol levels. These may be checked every 5 years, or more frequently if you are over 51 years old. Skin check. Lung cancer screening. You may have this screening every year starting at age 48 if you have a 30-pack-year history of smoking and currently smoke or have quit within the past 15 years. Fecal occult blood test (FOBT) of the stool. You may have this test every year starting at age 60. Flexible sigmoidoscopy or colonoscopy. You may have a sigmoidoscopy every 5 years or a colonoscopy every 10 years starting at age 65. Prostate cancer screening. Recommendations will vary depending on your family history and other risks. Hepatitis C blood test. Hepatitis B blood test. Sexually transmitted disease (STD) testing. Diabetes screening. This is done by checking your blood sugar (glucose) after you have not eaten for a while (fasting). You may have this done every 1-3 years. Abdominal aortic aneurysm (AAA) screening. You may need  this if you are a current or former smoker. Osteoporosis. You may be screened starting at age 74 if you are at high risk. Talk with your health care provider about your test results, treatment options, and if necessary, the need for more  tests. Vaccines  Your health care provider may recommend certain vaccines, such as: Influenza vaccine. This is recommended every year. Tetanus, diphtheria, and acellular pertussis (Tdap, Td) vaccine. You may need a Td booster every 10 years. Zoster vaccine. You may need this after age 84. Pneumococcal 13-valent conjugate (PCV13) vaccine. One dose is recommended after age 71. Pneumococcal polysaccharide (PPSV23) vaccine. One dose is recommended after age 35. Talk to your health care provider about which screenings and vaccines you need and how often you need them. This information is not intended to replace advice given to you by your health care provider. Make sure you discuss any questions you have with your health care provider. Document Released: 09/22/2015 Document Revised: 05/15/2016 Document Reviewed: 06/27/2015 Elsevier Interactive Patient Education  2017 [employer].  Fall Prevention in the Home Falls can cause injuries. They can happen to people of all ages. There are many things you can do to make your home safe and to help prevent falls. What can I do on the outside of my home? Regularly fix the edges of walkways and driveways and fix any cracks. Remove anything that might make you trip as you walk through a door, such as a raised step or threshold. Trim any bushes or trees on the path to your home. Use bright outdoor lighting. Clear any walking paths of anything that might make someone trip, such as rocks or tools. Regularly check to see if handrails are loose or broken. Make sure that both sides of any steps have handrails. Any raised decks and porches should have guardrails on the edges. Have any leaves, snow, or ice cleared regularly. Use sand or salt on walking paths during winter. Clean up any spills in your garage right away. This includes oil or grease spills. What can I do in the bathroom? Use night lights. Install grab bars by the toilet and in the tub and shower.  Do not use towel bars as grab bars. Use non-skid mats or decals in the tub or shower. If you need to sit down in the shower, use a plastic, non-slip stool. Keep the floor dry. Clean up any water that spills on the floor as soon as it happens. Remove soap buildup in the tub or shower regularly. Attach bath mats securely with double-sided non-slip rug tape. Do not have throw rugs and other things on the floor that can make you trip. What can I do in the bedroom? Use night lights. Make sure that you have a light by your bed that is easy to reach. Do not use any sheets or blankets that are too big for your bed. They should not hang down onto the floor. Have a firm chair that has side arms. You can use this for support while you get dressed. Do not have throw rugs and other things on the floor that can make you trip. What can I do in the kitchen? Clean up any spills right away. Avoid walking on wet floors. Keep items that you use a lot in easy-to-reach places. If you need to reach something above you, use a strong step stool that has a grab bar. Keep electrical cords out of the way. Do not use floor polish or wax that makes floors  slippery. If you must use wax, use non-skid floor wax. Do not have throw rugs and other things on the floor that can make you trip. What can I do with my stairs? Do not leave any items on the stairs. Make sure that there are handrails on both sides of the stairs and use them. Fix handrails that are broken or loose. Make sure that handrails are as long as the stairways. Check any carpeting to make sure that it is firmly attached to the stairs. Fix any carpet that is loose or worn. Avoid having throw rugs at the top or bottom of the stairs. If you do have throw rugs, attach them to the floor with carpet tape. Make sure that you have a light switch at the top of the stairs and the bottom of the stairs. If you do not have them, ask someone to add them for you. What else  can I do to help prevent falls? Wear shoes that: Do not have high heels. Have rubber bottoms. Are comfortable and fit you well. Are closed at the toe. Do not wear sandals. If you use a stepladder: Make sure that it is fully opened. Do not climb a closed stepladder. Make sure that both sides of the stepladder are locked into place. Ask someone to hold it for you, if possible. Clearly mark and make sure that you can see: Any grab bars or handrails. First and last steps. Where the edge of each step is. Use tools that help you move around (mobility aids) if they are needed. These include: Canes. Walkers. Scooters. Crutches. Turn on the lights when you go into a dark area. Replace any light bulbs as soon as they burn out. Set up your furniture so you have a clear path. Avoid moving your furniture around. If any of your floors are uneven, fix them. If there are any pets around you, be aware of where they are. Review your medicines with your doctor. Some medicines can make you feel dizzy. This can increase your chance of falling. Ask your doctor what other things that you can do to help prevent falls. This information is not intended to replace advice given to you by your health care provider. Make sure you discuss any questions you have with your health care provider. Document Released: 06/22/2009 Document Revised: 02/01/2016 Document Reviewed: 09/30/2014 Elsevier Interactive Patient Education  2017 [employer].

## 2023-04-23 ENCOUNTER — Other Ambulatory Visit: Payer: Self-pay

## 2023-05-07 ENCOUNTER — Telehealth: Payer: Self-pay

## 2023-05-07 NOTE — Telephone Encounter (Signed)
Copied from CRM [id number]. Topic: General - Other >> May 07, 2023  3:06 PM Santiya F wrote: Reason for CRM: Pt is calling in requesting to speak with Dr. Reita Cliche nurse regarding a referral. Please follow up with pt.

## 2023-05-09 ENCOUNTER — Other Ambulatory Visit: Payer: Self-pay

## 2023-05-09 ENCOUNTER — Other Ambulatory Visit: Payer: Self-pay | Admitting: Nurse Practitioner

## 2023-05-09 MED ORDER — CICLOPIROX 8 % EX SOLN
Freq: Every day | CUTANEOUS | 1 refills | Status: DC
  Filled 2023-05-09: qty 6.6, 30d supply, fill #0

## 2023-05-09 NOTE — Telephone Encounter (Signed)
MEDICATION SENT 

## 2023-05-09 NOTE — Telephone Encounter (Signed)
Pt. States he has tried Development worker, community before "and it didn't help. I would like a referral to the foot doctor." Please advise pt.

## 2023-05-09 NOTE — Telephone Encounter (Signed)
Unable to reach patient by phone to relay results.   Voicemail left with response from provider.

## 2023-05-15 ENCOUNTER — Other Ambulatory Visit: Payer: Self-pay

## 2023-05-19 ENCOUNTER — Other Ambulatory Visit: Payer: Self-pay

## 2023-05-22 ENCOUNTER — Other Ambulatory Visit: Payer: Self-pay

## 2023-05-22 DIAGNOSIS — H0102B Squamous blepharitis left eye, upper and lower eyelids: Secondary | ICD-10-CM | POA: Diagnosis not present

## 2023-05-22 DIAGNOSIS — H43813 Vitreous degeneration, bilateral: Secondary | ICD-10-CM | POA: Diagnosis not present

## 2023-05-22 DIAGNOSIS — H353132 Nonexudative age-related macular degeneration, bilateral, intermediate dry stage: Secondary | ICD-10-CM | POA: Diagnosis not present

## 2023-05-22 DIAGNOSIS — H0102A Squamous blepharitis right eye, upper and lower eyelids: Secondary | ICD-10-CM | POA: Diagnosis not present

## 2023-05-22 DIAGNOSIS — E1136 Type 2 diabetes mellitus with diabetic cataract: Secondary | ICD-10-CM | POA: Diagnosis not present

## 2023-05-22 DIAGNOSIS — H2513 Age-related nuclear cataract, bilateral: Secondary | ICD-10-CM | POA: Diagnosis not present

## 2023-05-30 ENCOUNTER — Other Ambulatory Visit: Payer: Self-pay | Admitting: Surgery

## 2023-05-30 DIAGNOSIS — K4020 Bilateral inguinal hernia, without obstruction or gangrene, not specified as recurrent: Secondary | ICD-10-CM

## 2023-05-30 DIAGNOSIS — K432 Incisional hernia without obstruction or gangrene: Secondary | ICD-10-CM | POA: Diagnosis not present

## 2023-06-09 ENCOUNTER — Other Ambulatory Visit: Payer: Self-pay | Admitting: Nurse Practitioner

## 2023-06-09 DIAGNOSIS — I2510 Atherosclerotic heart disease of native coronary artery without angina pectoris: Secondary | ICD-10-CM

## 2023-06-10 ENCOUNTER — Other Ambulatory Visit: Payer: Self-pay

## 2023-06-10 MED ORDER — CARVEDILOL 3.125 MG PO TABS
3.1250 mg | ORAL_TABLET | Freq: Two times a day (BID) | ORAL | 0 refills | Status: DC
  Filled 2023-06-10: qty 180, 90d supply, fill #0

## 2023-06-10 NOTE — Telephone Encounter (Signed)
Requested Prescriptions  Pending Prescriptions Disp Refills   carvedilol (COREG) 3.125 MG tablet 180 tablet 0    Sig: Take 1 tablet (3.125 mg total) by mouth 2 (two) times daily with a meal.     Cardiovascular: Beta Blockers 3 Passed - 06/09/2023 12:23 PM      Passed - Cr in normal range and within 360 days    Creat  Date Value Ref Range Status  11/15/2016 0.96 0.70 - 1.25 mg/dL Final    Comment:      For patients > or = 75 years of age: The upper reference limit for Creatinine is approximately 13% higher for people identified as African-American.      Creatinine, Ser  Date Value Ref Range Status  01/29/2023 1.11 0.76 - 1.27 mg/dL Final         Passed - AST in normal range and within 360 days    AST  Date Value Ref Range Status  01/29/2023 18 0 - 40 IU/L Final         Passed - ALT in normal range and within 360 days    ALT  Date Value Ref Range Status  01/29/2023 12 0 - 44 IU/L Final         Passed - Last BP in normal range    BP Readings from Last 1 Encounters:  01/29/23 112/70         Passed - Last Heart Rate in normal range    Pulse Readings from Last 1 Encounters:  01/29/23 (!) 56         Passed - Valid encounter within last 6 months    Recent Outpatient Visits           4 months ago Primary hypertension   [place] [place], [place], NP   7 months ago Prediabetes   [facility] [place], Shea Stakes, NP   10 months ago Nocturnal muscle spasm   [place] [facility] [place], Shea Stakes, NP   1 year ago Other tobacco product nicotine dependence, uncomplicated   [place] [facility] Hoy Register, MD   1 year ago Upper respiratory tract infection, unspecified type   [facility], Gildardo Pounds, NP

## 2023-06-11 ENCOUNTER — Other Ambulatory Visit: Payer: Self-pay

## 2023-07-01 ENCOUNTER — Ambulatory Visit
Admission: RE | Admit: 2023-07-01 | Discharge: 2023-07-01 | Disposition: A | Discharge status: Home / Self Care | Payer: Medicare HMO | Source: Ambulatory Visit | Attending: Surgery | Admitting: Surgery

## 2023-07-01 DIAGNOSIS — K432 Incisional hernia without obstruction or gangrene: Secondary | ICD-10-CM

## 2023-07-01 DIAGNOSIS — N281 Cyst of kidney, acquired: Secondary | ICD-10-CM | POA: Diagnosis not present

## 2023-07-01 DIAGNOSIS — K5730 Diverticulosis of large intestine without perforation or abscess without bleeding: Secondary | ICD-10-CM | POA: Diagnosis not present

## 2023-07-01 DIAGNOSIS — K4020 Bilateral inguinal hernia, without obstruction or gangrene, not specified as recurrent: Secondary | ICD-10-CM | POA: Diagnosis not present

## 2023-07-01 DIAGNOSIS — K8020 Calculus of gallbladder without cholecystitis without obstruction: Secondary | ICD-10-CM | POA: Diagnosis not present

## 2023-07-01 DIAGNOSIS — Z905 Acquired absence of kidney: Secondary | ICD-10-CM | POA: Diagnosis not present

## 2023-07-01 DIAGNOSIS — I700 Atherosclerosis of aorta: Secondary | ICD-10-CM | POA: Diagnosis not present

## 2023-07-01 MED ORDER — IOPAMIDOL (ISOVUE-300) INJECTION 61%
80.0000 mL | Freq: Once | INTRAVENOUS | Status: AC | PRN
  Administered 2023-07-01: 80 mL via INTRAVENOUS

## 2023-07-03 ENCOUNTER — Ambulatory Visit: Payer: Self-pay

## 2023-07-03 NOTE — Telephone Encounter (Signed)
Chief Complaint: bump on right mid cheek, size of pinto bean Symptoms: Redness, painful when touched 5-7 Frequency: onset 2 days Pertinent Negatives: Patient denies fever, drainage, other symptoms Disposition: [] ED /[] Urgent Care (no appt availability in office) / [x] Appointment(In office/virtual)/ []  [facility]/ [] Home Care/ [] Refused Recommended Disposition /[] [facility]/ []  Follow-up with PCP Additional Notes: Patient unsure how the bump appeared and wants to be seen regarding this, scheduled for tomorrow with PCP.     Summary: Something on face, wants to speak to a nurse.   Pt called and reports having something on his face that he is concerned about. He is unsure of what this is and how to explain it, he is requesting to speak to a nurse. Possible language barrier. [phone]     Reason for Disposition  [1] Swelling is painful to touch AND [2] no fever  Answer Assessment - Initial Assessment Questions 1. APPEARANCE of SWELLING: "What does it look like?"     Red 2. SIZE: "How large is the swelling?" (e.g., inches, cm; or compare to size of pinhead, tip of pen, eraser, coin, pea, grape, ping pong ball)      Pinto bean 3. LOCATION: "Where is the swelling located?"     Right mid cheek 4. ONSET: "When did the swelling start?"     2 days 5. COLOR: "What color is it?" "Is there more than one color?"     Red 6. PAIN: "Is there any pain?" If Yes, ask: "How bad is the pain?" (e.g., scale 1-10; or mild, moderate, severe)     - NONE (0): no pain   - MILD (1-3): doesn't interfere with normal activities    - MODERATE (4-7): interferes with normal activities or awakens from sleep    - SEVERE (8-10): excruciating pain, unable to do any normal activities     5-7 when touched 7. ITCH: "Does it itch?" If Yes, ask: "How bad is the itch?"      Yes 8. CAUSE: "What do you think caused the swelling?" 9 OTHER SYMPTOMS: "Do you have any other symptoms?" (e.g.,  fever)     No  Protocols used: Skin Lump or Localized Swelling-A-AH

## 2023-07-03 NOTE — Telephone Encounter (Signed)
Patient called, left VM to return the call to the office to speak to the NT.     Summary: Something on face, wants to speak to a nurse.   Pt called and reports having something on his face that he is concerned about. He is unsure of what this is and how to explain it, he is requesting to speak to a nurse. Possible language barrier. 1 [phone]

## 2023-07-03 NOTE — Telephone Encounter (Signed)
Attempted to call patient regarding symptom. Left message to call office

## 2023-07-04 ENCOUNTER — Ambulatory Visit: Payer: Medicare HMO | Attending: Nurse Practitioner | Admitting: Nurse Practitioner

## 2023-07-04 ENCOUNTER — Other Ambulatory Visit: Payer: Self-pay

## 2023-07-04 ENCOUNTER — Encounter: Payer: Self-pay | Admitting: Nurse Practitioner

## 2023-07-04 VITALS — BP 112/63 | HR 50 | Temp 98.1°F | Ht 65.0 in | Wt 194.2 lb

## 2023-07-04 DIAGNOSIS — N400 Enlarged prostate without lower urinary tract symptoms: Secondary | ICD-10-CM

## 2023-07-04 DIAGNOSIS — R7303 Prediabetes: Secondary | ICD-10-CM | POA: Diagnosis not present

## 2023-07-04 DIAGNOSIS — R0982 Postnasal drip: Secondary | ICD-10-CM | POA: Diagnosis not present

## 2023-07-04 DIAGNOSIS — E785 Hyperlipidemia, unspecified: Secondary | ICD-10-CM | POA: Diagnosis not present

## 2023-07-04 DIAGNOSIS — I2510 Atherosclerotic heart disease of native coronary artery without angina pectoris: Secondary | ICD-10-CM | POA: Diagnosis not present

## 2023-07-04 DIAGNOSIS — L739 Follicular disorder, unspecified: Secondary | ICD-10-CM

## 2023-07-04 DIAGNOSIS — F17200 Nicotine dependence, unspecified, uncomplicated: Secondary | ICD-10-CM | POA: Diagnosis not present

## 2023-07-04 DIAGNOSIS — J40 Bronchitis, not specified as acute or chronic: Secondary | ICD-10-CM | POA: Diagnosis not present

## 2023-07-04 DIAGNOSIS — J029 Acute pharyngitis, unspecified: Secondary | ICD-10-CM

## 2023-07-04 MED ORDER — CARVEDILOL 3.125 MG PO TABS
3.1250 mg | ORAL_TABLET | Freq: Two times a day (BID) | ORAL | 1 refills | Status: DC
  Filled 2023-07-04 – 2023-09-04 (×2): qty 180, 90d supply, fill #0
  Filled 2023-12-13: qty 180, 90d supply, fill #1

## 2023-07-04 MED ORDER — FLUTICASONE PROPIONATE 50 MCG/ACT NA SUSP
2.0000 | Freq: Every day | NASAL | 1 refills | Status: DC
  Filled 2023-07-04: qty 16, 30d supply, fill #0

## 2023-07-04 MED ORDER — ICOSAPENT ETHYL 1 G PO CAPS
1.0000 g | ORAL_CAPSULE | Freq: Two times a day (BID) | ORAL | 1 refills | Status: DC
  Filled 2023-07-04 – 2023-07-21 (×3): qty 180, 90d supply, fill #0
  Filled 2023-10-27: qty 180, 90d supply, fill #1

## 2023-07-04 MED ORDER — TAMSULOSIN HCL 0.4 MG PO CAPS
0.8000 mg | ORAL_CAPSULE | Freq: Every day | ORAL | 1 refills | Status: DC
  Filled 2023-07-04 – 2023-08-18 (×2): qty 180, 90d supply, fill #0
  Filled 2023-11-17: qty 180, 90d supply, fill #1

## 2023-07-04 MED ORDER — ALBUTEROL SULFATE HFA 108 (90 BASE) MCG/ACT IN AERS
1.0000 | INHALATION_SPRAY | Freq: Four times a day (QID) | RESPIRATORY_TRACT | 1 refills | Status: DC | PRN
  Filled 2023-07-04: qty 18, 25d supply, fill #0
  Filled 2024-01-28: qty 18, 25d supply, fill #1

## 2023-07-04 MED ORDER — AZITHROMYCIN 250 MG PO TABS
ORAL_TABLET | ORAL | 0 refills | Status: AC
Start: 2023-07-04 — End: 2023-07-09
  Filled 2023-07-04: qty 6, 5d supply, fill #0

## 2023-07-04 MED ORDER — MUPIROCIN 2 % EX OINT
1.0000 | TOPICAL_OINTMENT | Freq: Two times a day (BID) | CUTANEOUS | 1 refills | Status: DC
  Filled 2023-07-04 (×2): qty 22, 11d supply, fill #0

## 2023-07-04 MED ORDER — ATORVASTATIN CALCIUM 40 MG PO TABS
40.0000 mg | ORAL_TABLET | Freq: Every day | ORAL | 3 refills | Status: DC
  Filled 2023-07-04: qty 90, 90d supply, fill #0
  Filled 2023-10-12: qty 90, 90d supply, fill #1
  Filled 2024-01-13: qty 90, 90d supply, fill #2
  Filled 2024-04-18: qty 90, 90d supply, fill #3

## 2023-07-04 NOTE — Progress Notes (Unsigned)
Assessment & Plan:  [name] was seen today for mass and sore throat.  Diagnoses and all orders for this visit:  Bronchitis -     azithromycin (ZITHROMAX) 250 MG tablet; Take 2 tablets (500 mg total) by mouth daily for 1 day, THEN 1 tablet (250 mg total) daily for 4 days. (antibiotic for cough/bronchitis).  Sore throat -     azithromycin (ZITHROMAX) 250 MG tablet; Take 2 tablets (500 mg total) by mouth daily for 1 day, THEN 1 tablet (250 mg total) daily for 4 days. (antibiotic for cough/bronchitis).  Post-nasal drainage -     fluticasone (FLONASE) 50 MCG/ACT nasal spray; Place 2 sprays into both nostrils daily.  Coronary artery disease involving native coronary artery of native heart without angina pectoris -     carvedilol (COREG) 3.125 MG tablet; Take 1 tablet (3.125 mg total) by mouth 2 (two) times daily with a meal. -     icosapent Ethyl (VASCEPA) 1 g capsule; Take 1 capsule (1 g total) by mouth 2 (two) times daily. For cholesterol  Dyslipidemia, goal LDL below 70 -     atorvastatin (LIPITOR) 40 MG tablet; Take 1 tablet (40 mg total) by mouth at bedtime. -     icosapent Ethyl (VASCEPA) 1 g capsule; Take 1 capsule (1 g total) by mouth 2 (two) times daily. For cholesterol  Tobacco dependence -     albuterol (VENTOLIN HFA) 108 (90 Base) MCG/ACT inhaler; Inhale 1-2 puffs into the lungs every 6 (six) hours as needed.  Benign prostatic hyperplasia, unspecified whether lower urinary tract symptoms present -     tamsulosin (FLOMAX) 0.4 MG CAPS capsule; Take 2 capsules (0.8 mg total) by mouth daily.  Prediabetes -     CMP14+EGFR -     Hemoglobin A1c  Folliculitis -     mupirocin ointment (BACTROBAN) 2 %; Apply 1 Application topically 2 (two) times daily. Apply to right cheek    Patient has been counseled on age-appropriate routine health concerns for screening and prevention. These are reviewed and up-to-date. Referrals have been placed accordingly. Immunizations are up-to-date or  declined.    Subjective:   Chief Complaint  Patient presents with  . Mass    On right cheek   . Sore Throat    Patient has been experiencing a sore throat, cough with phlegm and congestion. Patient also states that his voice is strained. On set two weeks ago.     Samuel Lawson 77 y.o. male presents to office today with complaints of cough x2 weeks and sore throat. He is still smoking as well.    HPI  ROS  Past Medical History:  Diagnosis Date  . Acute MI (HCC) 2009   STENTS PLACED  . Chronic kidney disease   . Collagen vascular disease (HCC)   . Hyperlipidemia   . Hypertension   . Pre-diabetes   . Prediabetes     Past Surgical History:  Procedure Laterality Date  . ANGIOPLASTY    . CARDIAC CATHETERIZATION    . INCISIONAL HERNIA REPAIR N/A 05/02/2022   Procedure: LAPAROSCOPIC INCISIONAL HERNIA REPAIR WITH MESH;  Surgeon: Abigail Miyamoto, MD;  Location: [facility];  Service: General;  Laterality: N/A;  . ROBOTIC ASSITED PARTIAL NEPHRECTOMY Left 12/05/2021   Procedure: XI ROBOTIC ASSITED PARTIAL NEPHRECTOMY;  Surgeon: Crist Fat, MD;  Location: [facility];  Service: Urology;  Laterality: Left;  3.5 HRS    Family History  Problem Relation Age of Onset  . Heart Problems Mother   .  Stroke Father   . Alzheimer's disease Father   . High blood pressure Father   . Colon cancer Neg Hx   . Colon polyps Neg Hx   . Esophageal cancer Neg Hx   . Rectal cancer Neg Hx   . Stomach cancer Neg Hx     Social History Reviewed with no changes to be made today.   Outpatient Medications Prior to Visit  Medication Sig Dispense Refill  . aspirin EC 81 MG tablet Take 81 mg by mouth at bedtime. Swallow whole.    . Blood Glucose Calibration (ACCU-CHEK GUIDE CONTROL) LIQD 1 each by In Vitro route once as needed for up to 1 dose. R73.03 1 each 0  . Cholecalciferol (VITAMIN D3) 125 MCG (5000 UT) CAPS Take 5,000 Units by mouth daily. When compliant    . ciclopirox (PENLAC) 8 % solution  Apply topically at bedtime. Apply over nail and surrounding skin. Apply daily over previous coat. After seven (7) days, may remove with alcohol and continue cycle. 6 mL 1  . clopidogrel (PLAVIX) 75 MG tablet Take 75 mg by mouth once.    . diclofenac Sodium (VOLTAREN) 1 % GEL Apply 2 g topically 4 (four) times daily. For joint pain 200 g 0  . glucose blood (ACCU-CHEK GUIDE) test strip Use as instructed. Check blood glucose by fingerstick once per day. R73.03 100 each 0  . Multiple Vitamins-Minerals (MULTIVITAMIN WITH MINERALS) tablet Take 1 tablet by mouth daily.    Marland Kitchen senna-docusate (SENOKOT-S) 8.6-50 MG tablet Take 1 tablet by mouth daily. For constipation 100 tablet 1  . albuterol (VENTOLIN HFA) 108 (90 Base) MCG/ACT inhaler Inhale 1-2 puffs into the lungs every 6 (six) hours as needed. 18 g 1  . atorvastatin (LIPITOR) 40 MG tablet Take 1 tablet (40 mg total) by mouth at bedtime. 90 tablet 3  . carvedilol (COREG) 3.125 MG tablet Take 1 tablet (3.125 mg total) by mouth 2 (two) times daily with a meal. 180 tablet 0  . fluticasone (FLONASE) 50 MCG/ACT nasal spray Place 2 sprays into both nostrils daily. 16 g 1  . omega-3 acid ethyl esters (LOVAZA) 1 g capsule Take 1 capsule (1 g total) by mouth 2 (two) times daily. 180 capsule 0  . tamsulosin (FLOMAX) 0.4 MG CAPS capsule Take 2 capsules (0.8 mg total) by mouth daily. 180 capsule 1  . tamsulosin (FLOMAX) 0.4 MG CAPS capsule Take 2 capsules (0.8 mg total) by mouth daily. 60 capsule 11  . ondansetron (ZOFRAN-ODT) 8 MG disintegrating tablet Take 8 mg by mouth every 8 (eight) hours as needed. (Patient not taking: Reported on 01/29/2023)     Facility-Administered Medications Prior to Visit  Medication Dose Route Frequency Provider Last Rate Last Admin  . 0.9 %  sodium chloride infusion  500 mL Intravenous Continuous Armbruster, Willaim Rayas, MD        No Known Allergies     Objective:    BP 112/63 (BP Location: Left Arm, Patient Position: Sitting, Cuff  Size: Normal)   Pulse (!) 50   Temp 98.1 F (36.7 C) (Oral)   Ht 5\' 5"  (1.651 m)   Wt 194 lb 3.2 oz (88.1 kg)   SpO2 99%   BMI 32.32 kg/m  Wt Readings from Last 3 Encounters:  07/04/23 194 lb 3.2 oz (88.1 kg)  04/22/23 182 lb (82.6 kg)  01/29/23 182 lb 12.8 oz (82.9 kg)    Physical Exam Vitals reviewed.  Constitutional:      Appearance: He  is well-developed.  HENT:     Head: Normocephalic.     Right Ear: Tympanic membrane and ear canal normal.     Left Ear: Tympanic membrane and ear canal normal.  Cardiovascular:     Rate and Rhythm: Normal rate and regular rhythm.  Pulmonary:     Effort: Pulmonary effort is normal.     Breath sounds: Normal breath sounds.  Abdominal:     General: Bowel sounds are normal. There is distension.     Palpations: Abdomen is soft.  Musculoskeletal:     Cervical back: Normal range of motion and neck supple.  Skin:    General: Skin is warm and dry.  Neurological:     Mental Status: He is alert and oriented to person, place, and time.        Patient has been counseled extensively about nutrition and exercise as well as the importance of adherence with medications and regular follow-up. The patient was given clear instructions to go to ER or return to medical center if symptoms don't improve, worsen or new problems develop. The patient verbalized understanding.   Follow-up: Return in about 3 months (around 10/04/2023).   Claiborne Rigg, FNP-BC [facility] [place], [place] [phone]   07/04/2023, 4:02 PM

## 2023-07-05 LAB — CMP14+EGFR
ALT: 11 [IU]/L (ref 0–44)
AST: 19 [IU]/L (ref 0–40)
Albumin: 4.3 g/dL (ref 3.8–4.8)
Alkaline Phosphatase: 68 [IU]/L (ref 44–121)
BUN/Creatinine Ratio: 13 (ref 10–24)
BUN: 16 mg/dL (ref 8–27)
Bilirubin Total: 1.1 mg/dL (ref 0.0–1.2)
CO2: 24 mmol/L (ref 20–29)
Calcium: 9 mg/dL (ref 8.6–10.2)
Chloride: 104 mmol/L (ref 96–106)
Creatinine, Ser: 1.23 mg/dL (ref 0.76–1.27)
Globulin, Total: 2.2 g/dL (ref 1.5–4.5)
Glucose: 112 mg/dL — ABNORMAL HIGH (ref 70–99)
Potassium: 4.4 mmol/L (ref 3.5–5.2)
Sodium: 140 mmol/L (ref 134–144)
Total Protein: 6.5 g/dL (ref 6.0–8.5)
eGFR: 61 mL/min/{1.73_m2} (ref >59)

## 2023-07-05 LAB — HEMOGLOBIN A1C
Est. average glucose Bld gHb Est-mCnc: 123 mg/dL
Hgb A1c MFr Bld: 5.9 % — ABNORMAL HIGH (ref 4.8–5.6)

## 2023-07-15 ENCOUNTER — Other Ambulatory Visit: Payer: Self-pay

## 2023-07-16 ENCOUNTER — Other Ambulatory Visit: Payer: Self-pay

## 2023-07-16 ENCOUNTER — Other Ambulatory Visit (HOSPITAL_BASED_OUTPATIENT_CLINIC_OR_DEPARTMENT_OTHER): Payer: Medicare HMO | Admitting: Pharmacist

## 2023-07-16 DIAGNOSIS — E781 Pure hyperglyceridemia: Secondary | ICD-10-CM

## 2023-07-16 NOTE — Progress Notes (Signed)
Pharmacy Quality Measure Review  This patient is appearing on a report for being at risk of failing the adherence measure for cholesterol (statin) medications this calendar year.   Medication: atorvastatin Last fill date: 07/04/23 for 90 day supply  Insurance report was not up to date. No action needed at this time.   Butch Penny, PharmD, P[place]y [place], CPP Clinical Pharmacist [facility] [phone]

## 2023-07-22 ENCOUNTER — Other Ambulatory Visit: Payer: Self-pay

## 2023-07-25 ENCOUNTER — Other Ambulatory Visit: Payer: Self-pay

## 2023-08-18 ENCOUNTER — Other Ambulatory Visit: Payer: Self-pay

## 2023-08-19 ENCOUNTER — Other Ambulatory Visit: Payer: Self-pay

## 2023-08-21 ENCOUNTER — Other Ambulatory Visit: Payer: Self-pay

## 2023-09-04 ENCOUNTER — Other Ambulatory Visit: Payer: Self-pay

## 2023-10-13 ENCOUNTER — Other Ambulatory Visit: Payer: Self-pay

## 2023-10-17 ENCOUNTER — Other Ambulatory Visit: Payer: Self-pay

## 2023-10-21 ENCOUNTER — Encounter: Payer: Self-pay | Admitting: Gastroenterology

## 2023-10-31 ENCOUNTER — Other Ambulatory Visit: Payer: Self-pay

## 2023-11-16 IMAGING — CT CT CHEST LUNG CANCER SCREENING LOW DOSE W/O CM
2 of 4 series · 15 of 36 positions shown, 18 images · non-contrast
Comparison: 07/03/2015 chest radiograph.

CLINICAL DATA: Forty-nine pack-year smoking history. Current
smoker.

EXAM:
CT CHEST WITHOUT CONTRAST LOW-DOSE FOR LUNG CANCER SCREENING
TECHNIQUE: Multidetector CT imaging of the chest was performed following the
standard protocol without IV contrast.

[Series 3: coronal · coronal · 0.61mm/px · 3 of 299 slices shown]
[im 60/299  lung]
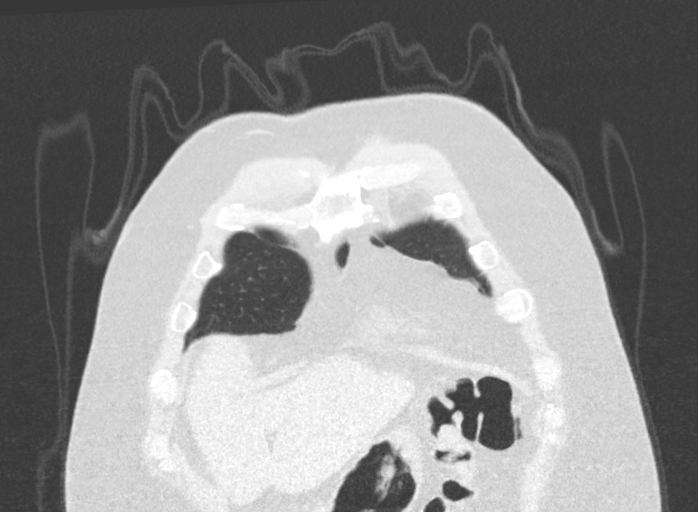
[im 120/299  lung]
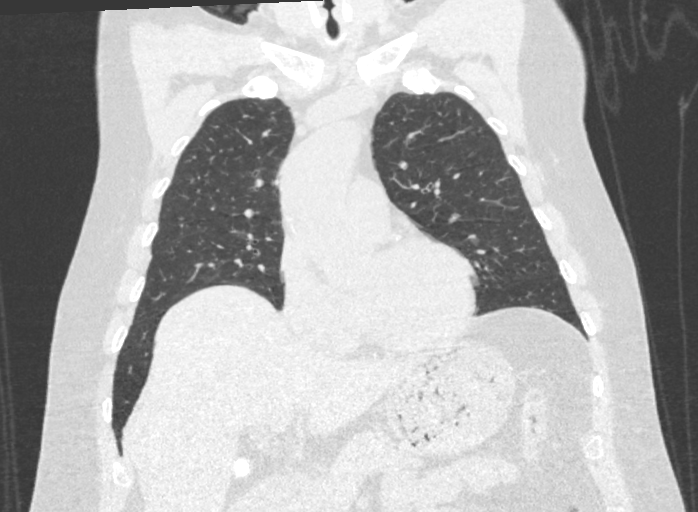
[im 179/299  lung]
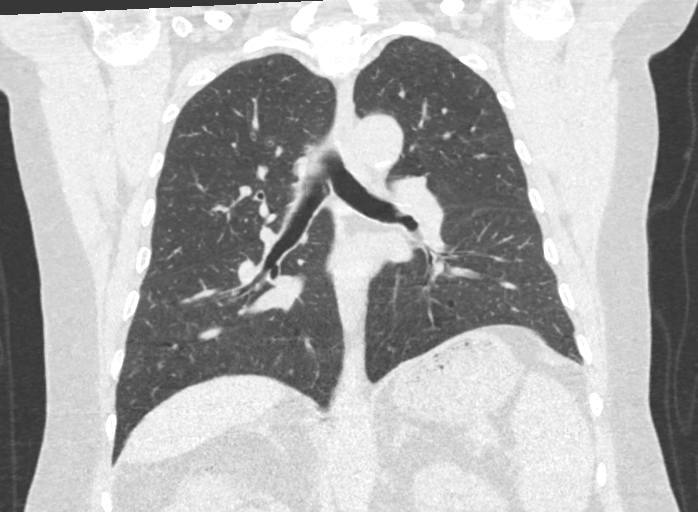

[Series 6: lungs · axial · 0.79mm/px · z∈[-266,+58]mm · 12 of 358 slices shown, 15 images]
[im 17/358  mediastinal]
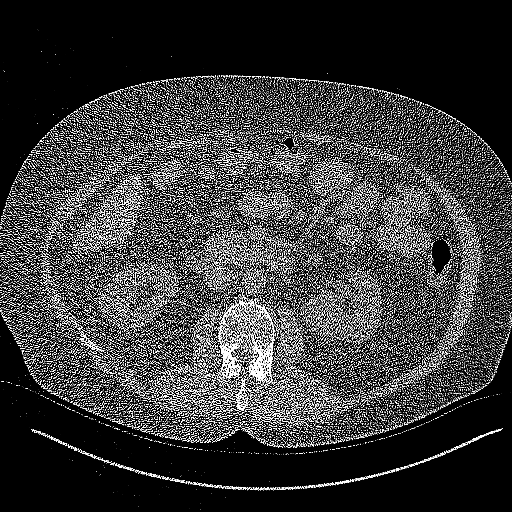
[im 17/358  lung]
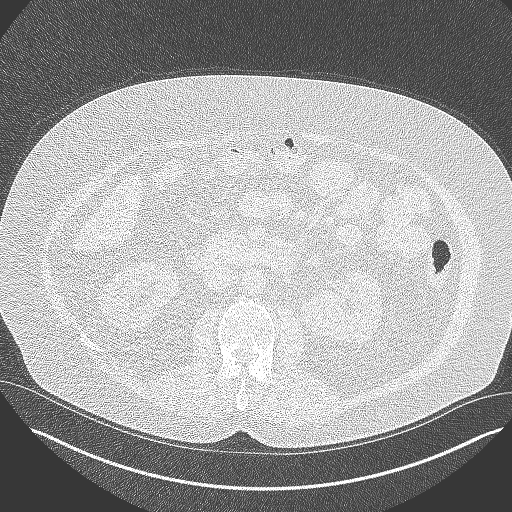
[im 49/358  lung]
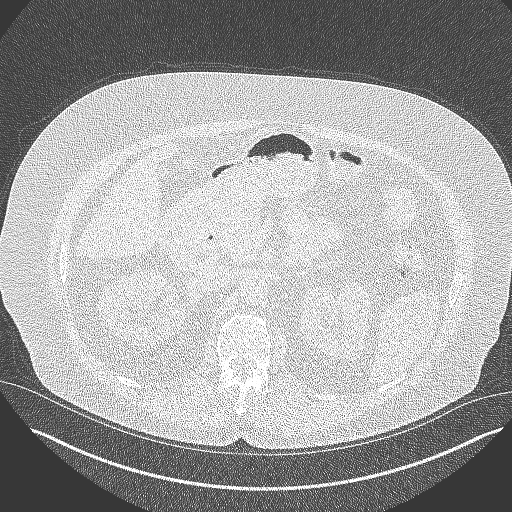
[im 82/358  lung]
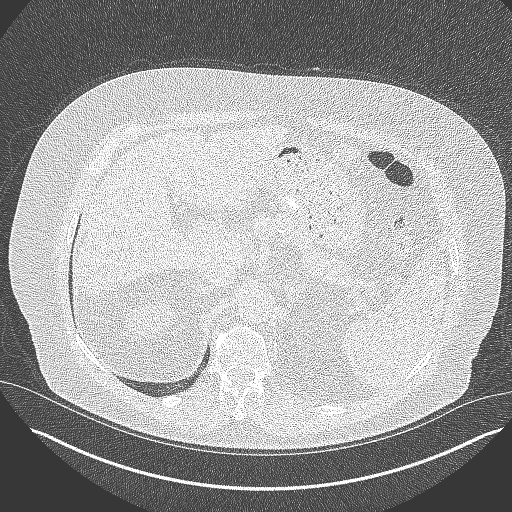
[im 114/358  lung]
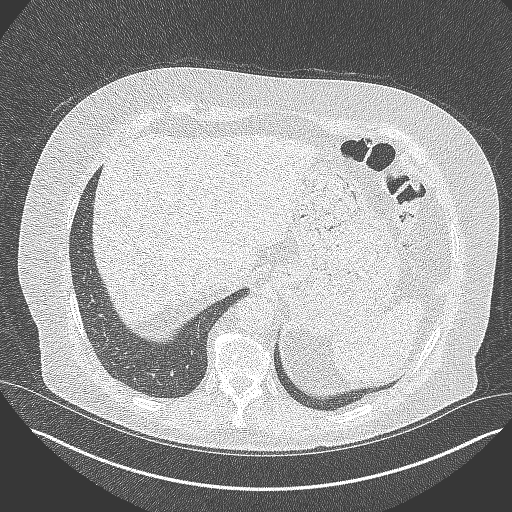
[im 130/358  mediastinal]
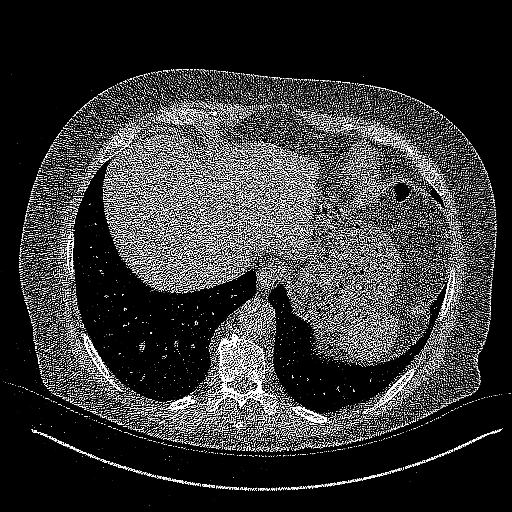
[im 130/358  lung]
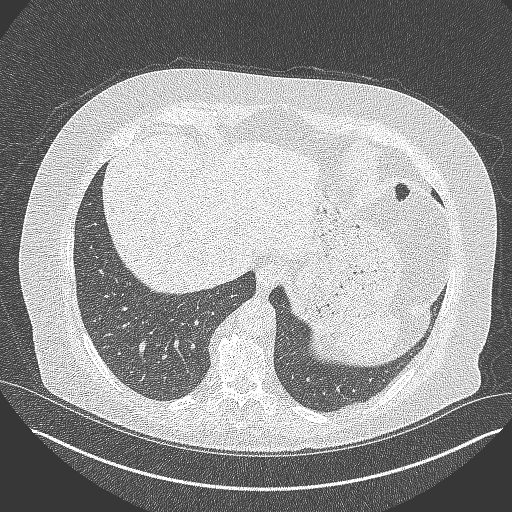
[im 163/358  lung]
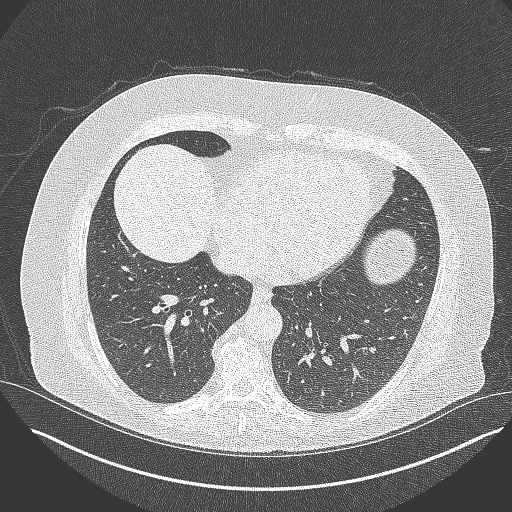
[im 195/358  lung]
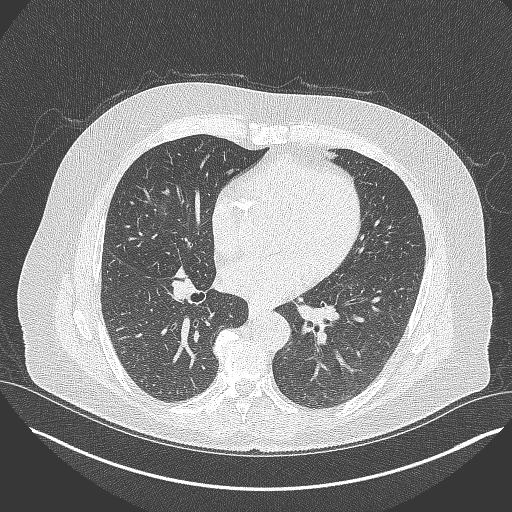
[im 228/358  lung]
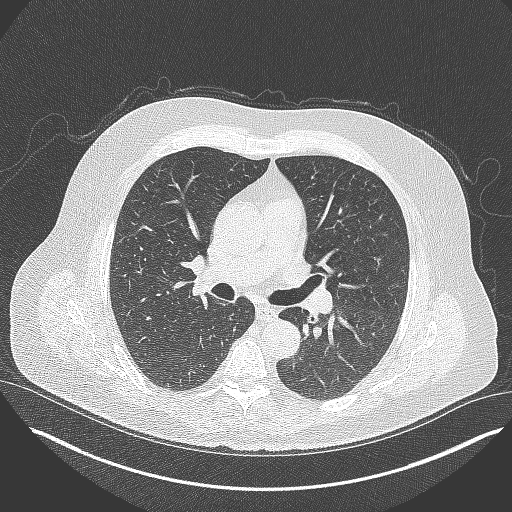
[im 244/358  mediastinal]
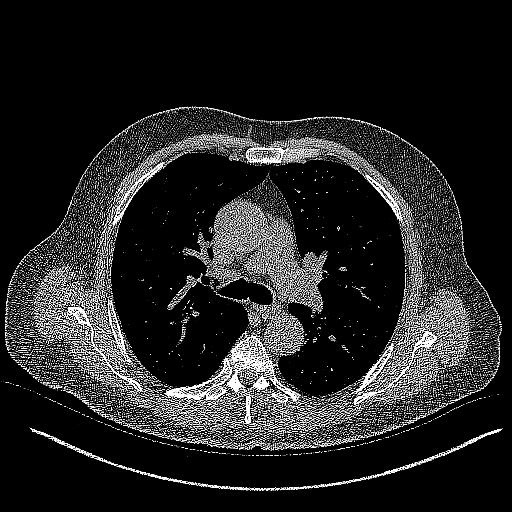
[im 244/358  lung]
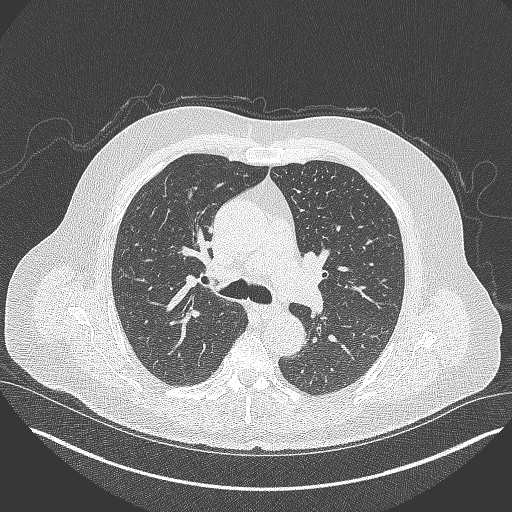
[im 276/358  lung]
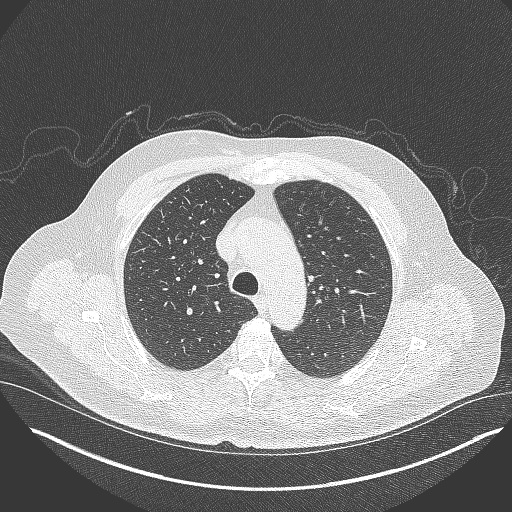
[im 309/358  lung]
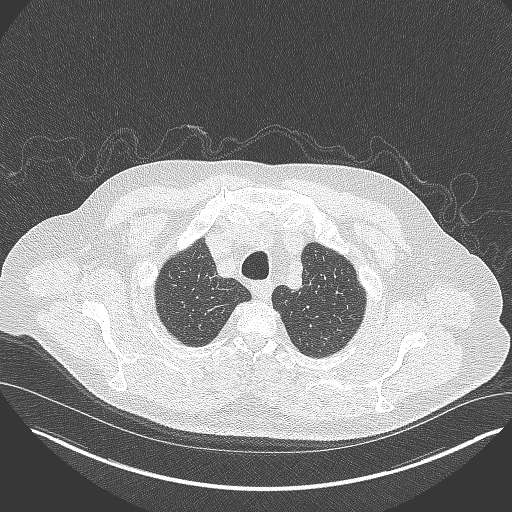
[im 341/358  lung]
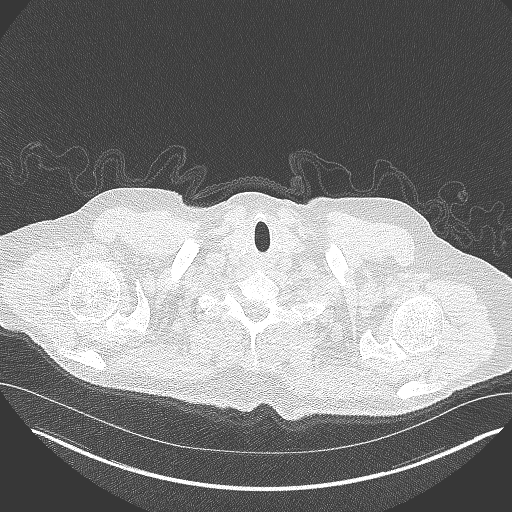

[15 of 36 positions shown; findings below may reference images not displayed]

FINDINGS: Cardiovascular: Bovine arch. Aortic atherosclerosis. Tortuous
thoracic aorta. Normal heart size, without pericardial effusion.
Three vessel coronary artery calcification.

Mediastinum/Nodes: No mediastinal or definite hilar adenopathy,
given limitations of unenhanced CT.

Lungs/Pleura: No pleural fluid. Mild centrilobular emphysema. Mild
motion degradation inferiorly.

A left upper lobe isolated pulmonary nodule of volume derived
equivalent diameter 2.4 mm.

Upper Abdomen: Gallstone of 9 mm. Normal imaged portions of the
liver, spleen, stomach, pancreas, adrenal glands, right kidney. An
interpolar left renal incompletely imaged lesion of 2.9 cm is likely
a cyst or minimally complex cyst.

Musculoskeletal: No acute osseous abnormality.
IMPRESSION: 1. Lung-RADS 2, benign appearance or behavior. Continue annual
screening with low-dose chest CT without contrast in 12 months.
2. Cholelithiasis.
3. Aortic Atherosclerosis (APWSB-RS5.5) and Emphysema (APWSB-0OG.2).
Coronary artery atherosclerosis.

## 2023-11-17 ENCOUNTER — Other Ambulatory Visit: Payer: Self-pay

## 2023-11-18 ENCOUNTER — Other Ambulatory Visit: Payer: Self-pay

## 2023-12-10 DIAGNOSIS — H6123 Impacted cerumen, bilateral: Secondary | ICD-10-CM | POA: Diagnosis not present

## 2023-12-10 DIAGNOSIS — R42 Dizziness and giddiness: Secondary | ICD-10-CM | POA: Diagnosis not present

## 2023-12-10 DIAGNOSIS — H903 Sensorineural hearing loss, bilateral: Secondary | ICD-10-CM | POA: Diagnosis not present

## 2023-12-15 ENCOUNTER — Other Ambulatory Visit: Payer: Self-pay

## 2023-12-15 ENCOUNTER — Other Ambulatory Visit: Payer: Self-pay | Admitting: Nurse Practitioner

## 2023-12-15 DIAGNOSIS — I2510 Atherosclerotic heart disease of native coronary artery without angina pectoris: Secondary | ICD-10-CM

## 2023-12-15 DIAGNOSIS — E785 Hyperlipidemia, unspecified: Secondary | ICD-10-CM

## 2023-12-15 DIAGNOSIS — N400 Enlarged prostate without lower urinary tract symptoms: Secondary | ICD-10-CM

## 2023-12-16 ENCOUNTER — Other Ambulatory Visit: Payer: Self-pay

## 2023-12-16 MED ORDER — ICOSAPENT ETHYL 1 G PO CAPS
1.0000 g | ORAL_CAPSULE | Freq: Two times a day (BID) | ORAL | 0 refills | Status: DC
  Filled 2023-12-16 – 2024-01-27 (×2): qty 60, 30d supply, fill #0

## 2023-12-16 MED ORDER — TAMSULOSIN HCL 0.4 MG PO CAPS
0.8000 mg | ORAL_CAPSULE | Freq: Every day | ORAL | 0 refills | Status: DC
  Filled 2023-12-16 – 2024-02-16 (×3): qty 60, 30d supply, fill #0

## 2023-12-16 MED ORDER — CARVEDILOL 3.125 MG PO TABS
3.1250 mg | ORAL_TABLET | Freq: Two times a day (BID) | ORAL | 0 refills | Status: DC
  Filled 2023-12-16 – 2024-03-15 (×2): qty 60, 30d supply, fill #0

## 2024-01-01 DIAGNOSIS — R42 Dizziness and giddiness: Secondary | ICD-10-CM | POA: Diagnosis not present

## 2024-01-01 DIAGNOSIS — H903 Sensorineural hearing loss, bilateral: Secondary | ICD-10-CM | POA: Diagnosis not present

## 2024-01-01 DIAGNOSIS — H6121 Impacted cerumen, right ear: Secondary | ICD-10-CM | POA: Diagnosis not present

## 2024-01-14 ENCOUNTER — Telehealth: Payer: Self-pay | Admitting: Nurse Practitioner

## 2024-01-14 NOTE — Telephone Encounter (Signed)
 Copied from CRM [id number]. Topic: Clinical - Medication Question >> Jan 14, 2024 12:26 PM Lotus Round B wrote:  Reason for CRM: pt called in to see if Dr.Fleming can prescribe him something to quit cigarettesif there is any way he can get a call from the nurse or Dr.fleming for this matter

## 2024-01-14 NOTE — Telephone Encounter (Signed)
 Pt called back in to see what the dr had said about request. I advised him that someone would call when with instructions once it has been read.

## 2024-01-14 NOTE — Telephone Encounter (Signed)
 Noted.

## 2024-01-15 ENCOUNTER — Other Ambulatory Visit: Payer: Self-pay | Admitting: Nurse Practitioner

## 2024-01-15 DIAGNOSIS — F17200 Nicotine dependence, unspecified, uncomplicated: Secondary | ICD-10-CM

## 2024-01-15 MED ORDER — BUPROPION HCL ER (SR) 150 MG PO TB12
150.0000 mg | ORAL_TABLET | Freq: Two times a day (BID) | ORAL | 1 refills | Status: DC
  Filled 2024-01-15: qty 60, 30d supply, fill #0
  Filled 2024-03-23: qty 60, 30d supply, fill #1

## 2024-01-15 NOTE — Telephone Encounter (Signed)
 Buproprion sent to pharmacy for smoking cessation

## 2024-01-16 ENCOUNTER — Other Ambulatory Visit: Payer: Self-pay

## 2024-01-19 NOTE — Telephone Encounter (Signed)
 Patient identified by name and date of birth.   Patient aware of response and voiced understanding.

## 2024-01-27 ENCOUNTER — Other Ambulatory Visit: Payer: Self-pay

## 2024-01-28 ENCOUNTER — Other Ambulatory Visit: Payer: Self-pay

## 2024-02-16 ENCOUNTER — Other Ambulatory Visit: Payer: Self-pay

## 2024-02-16 ENCOUNTER — Other Ambulatory Visit: Payer: Self-pay | Admitting: Nurse Practitioner

## 2024-02-16 DIAGNOSIS — N400 Enlarged prostate without lower urinary tract symptoms: Secondary | ICD-10-CM

## 2024-02-20 ENCOUNTER — Other Ambulatory Visit: Payer: Self-pay

## 2024-02-24 ENCOUNTER — Other Ambulatory Visit: Payer: Self-pay

## 2024-03-01 ENCOUNTER — Other Ambulatory Visit: Payer: Self-pay | Admitting: Family Medicine

## 2024-03-01 DIAGNOSIS — E785 Hyperlipidemia, unspecified: Secondary | ICD-10-CM

## 2024-03-01 DIAGNOSIS — I2510 Atherosclerotic heart disease of native coronary artery without angina pectoris: Secondary | ICD-10-CM

## 2024-03-02 ENCOUNTER — Other Ambulatory Visit: Payer: Self-pay

## 2024-03-02 MED ORDER — ICOSAPENT ETHYL 1 G PO CAPS
1.0000 g | ORAL_CAPSULE | Freq: Two times a day (BID) | ORAL | 0 refills | Status: DC
  Filled 2024-03-02: qty 60, 30d supply, fill #0

## 2024-03-03 ENCOUNTER — Other Ambulatory Visit: Payer: Self-pay

## 2024-03-09 ENCOUNTER — Other Ambulatory Visit: Payer: Self-pay | Admitting: Surgery

## 2024-03-09 DIAGNOSIS — K432 Incisional hernia without obstruction or gangrene: Secondary | ICD-10-CM | POA: Diagnosis not present

## 2024-03-15 ENCOUNTER — Other Ambulatory Visit: Payer: Self-pay

## 2024-03-17 ENCOUNTER — Ambulatory Visit (HOSPITAL_COMMUNITY)
Admission: RE | Admit: 2024-03-17 | Discharge: 2024-03-17 | Disposition: A | Discharge status: Home / Self Care | Source: Ambulatory Visit | Attending: Urology | Admitting: Urology

## 2024-03-17 ENCOUNTER — Other Ambulatory Visit: Payer: Self-pay

## 2024-03-17 ENCOUNTER — Other Ambulatory Visit (HOSPITAL_COMMUNITY): Payer: Self-pay | Admitting: Urology

## 2024-03-17 DIAGNOSIS — C642 Malignant neoplasm of left kidney, except renal pelvis: Secondary | ICD-10-CM

## 2024-03-17 DIAGNOSIS — R918 Other nonspecific abnormal finding of lung field: Secondary | ICD-10-CM | POA: Diagnosis not present

## 2024-03-18 LAB — LAB REPORT - SCANNED: EGFR: 65.3

## 2024-03-23 ENCOUNTER — Other Ambulatory Visit: Payer: Self-pay | Admitting: Pharmacist

## 2024-03-23 ENCOUNTER — Other Ambulatory Visit: Payer: Self-pay | Admitting: Family Medicine

## 2024-03-23 ENCOUNTER — Other Ambulatory Visit: Payer: Self-pay

## 2024-03-23 DIAGNOSIS — K8020 Calculus of gallbladder without cholecystitis without obstruction: Secondary | ICD-10-CM | POA: Diagnosis not present

## 2024-03-23 DIAGNOSIS — N400 Enlarged prostate without lower urinary tract symptoms: Secondary | ICD-10-CM

## 2024-03-23 DIAGNOSIS — C642 Malignant neoplasm of left kidney, except renal pelvis: Secondary | ICD-10-CM | POA: Diagnosis not present

## 2024-03-23 DIAGNOSIS — Z905 Acquired absence of kidney: Secondary | ICD-10-CM | POA: Diagnosis not present

## 2024-03-23 MED ORDER — TAMSULOSIN HCL 0.4 MG PO CAPS
0.8000 mg | ORAL_CAPSULE | Freq: Every day | ORAL | 0 refills | Status: DC
  Filled 2024-03-23: qty 60, 30d supply, fill #0

## 2024-03-24 ENCOUNTER — Other Ambulatory Visit: Payer: Self-pay

## 2024-03-29 ENCOUNTER — Telehealth: Payer: Self-pay | Admitting: Nurse Practitioner

## 2024-03-29 NOTE — Telephone Encounter (Signed)
Contacted pt confirmed appt

## 2024-03-30 ENCOUNTER — Emergency Department (HOSPITAL_COMMUNITY)

## 2024-03-30 ENCOUNTER — Other Ambulatory Visit: Payer: Self-pay

## 2024-03-30 ENCOUNTER — Encounter (HOSPITAL_COMMUNITY): Payer: Self-pay

## 2024-03-30 ENCOUNTER — Encounter: Payer: Self-pay | Admitting: Nurse Practitioner

## 2024-03-30 ENCOUNTER — Observation Stay (HOSPITAL_COMMUNITY)
Admission: EM | Admit: 2024-03-30 | Discharge: 2024-03-31 | Disposition: A | Discharge status: Home / Self Care | Attending: Family Medicine | Admitting: Family Medicine

## 2024-03-30 ENCOUNTER — Ambulatory Visit: Attending: Nurse Practitioner | Admitting: Nurse Practitioner

## 2024-03-30 VITALS — BP 112/63 | HR 43 | Resp 19 | Ht 65.0 in | Wt 182.8 lb

## 2024-03-30 DIAGNOSIS — F17210 Nicotine dependence, cigarettes, uncomplicated: Secondary | ICD-10-CM | POA: Insufficient documentation

## 2024-03-30 DIAGNOSIS — I129 Hypertensive chronic kidney disease with stage 1 through stage 4 chronic kidney disease, or unspecified chronic kidney disease: Secondary | ICD-10-CM | POA: Insufficient documentation

## 2024-03-30 DIAGNOSIS — I2510 Atherosclerotic heart disease of native coronary artery without angina pectoris: Secondary | ICD-10-CM

## 2024-03-30 DIAGNOSIS — R55 Syncope and collapse: Secondary | ICD-10-CM | POA: Diagnosis not present

## 2024-03-30 DIAGNOSIS — N189 Chronic kidney disease, unspecified: Secondary | ICD-10-CM | POA: Diagnosis not present

## 2024-03-30 DIAGNOSIS — Z7982 Long term (current) use of aspirin: Secondary | ICD-10-CM | POA: Insufficient documentation

## 2024-03-30 DIAGNOSIS — Z7902 Long term (current) use of antithrombotics/antiplatelets: Secondary | ICD-10-CM | POA: Diagnosis not present

## 2024-03-30 DIAGNOSIS — Z955 Presence of coronary angioplasty implant and graft: Secondary | ICD-10-CM | POA: Insufficient documentation

## 2024-03-30 DIAGNOSIS — Z122 Encounter for screening for malignant neoplasm of respiratory organs: Secondary | ICD-10-CM

## 2024-03-30 DIAGNOSIS — Z79899 Other long term (current) drug therapy: Secondary | ICD-10-CM | POA: Insufficient documentation

## 2024-03-30 DIAGNOSIS — I442 Atrioventricular block, complete: Secondary | ICD-10-CM | POA: Diagnosis not present

## 2024-03-30 DIAGNOSIS — R42 Dizziness and giddiness: Secondary | ICD-10-CM | POA: Diagnosis not present

## 2024-03-30 DIAGNOSIS — I10 Essential (primary) hypertension: Secondary | ICD-10-CM | POA: Diagnosis not present

## 2024-03-30 DIAGNOSIS — R001 Bradycardia, unspecified: Secondary | ICD-10-CM | POA: Diagnosis not present

## 2024-03-30 DIAGNOSIS — F17200 Nicotine dependence, unspecified, uncomplicated: Secondary | ICD-10-CM | POA: Diagnosis not present

## 2024-03-30 DIAGNOSIS — R7303 Prediabetes: Secondary | ICD-10-CM | POA: Diagnosis not present

## 2024-03-30 DIAGNOSIS — E7800 Pure hypercholesterolemia, unspecified: Secondary | ICD-10-CM

## 2024-03-30 DIAGNOSIS — R531 Weakness: Secondary | ICD-10-CM | POA: Diagnosis not present

## 2024-03-30 DIAGNOSIS — E1122 Type 2 diabetes mellitus with diabetic chronic kidney disease: Secondary | ICD-10-CM | POA: Diagnosis not present

## 2024-03-30 DIAGNOSIS — E785 Hyperlipidemia, unspecified: Secondary | ICD-10-CM | POA: Insufficient documentation

## 2024-03-30 LAB — COMPREHENSIVE METABOLIC PANEL WITH GFR
ALT: 11 U/L (ref 0–44)
AST: 22 U/L (ref 15–41)
Albumin: 4.4 g/dL (ref 3.5–5.0)
Alkaline Phosphatase: 65 U/L (ref 38–126)
Anion gap: 11 (ref 5–15)
BUN: 14 mg/dL (ref 8–23)
CO2: 23 mmol/L (ref 22–32)
Calcium: 9.1 mg/dL (ref 8.9–10.3)
Chloride: 106 mmol/L (ref 98–111)
Creatinine, Ser: 1.51 mg/dL — ABNORMAL HIGH (ref 0.61–1.24)
GFR, Estimated: 48 mL/min — ABNORMAL LOW (ref >60)
Glucose, Bld: 114 mg/dL — ABNORMAL HIGH (ref 70–99)
Potassium: 4.4 mmol/L (ref 3.5–5.1)
Sodium: 140 mmol/L (ref 135–145)
Total Bilirubin: 1.6 mg/dL — ABNORMAL HIGH (ref 0.0–1.2)
Total Protein: 7.7 g/dL (ref 6.5–8.1)

## 2024-03-30 LAB — CBC WITH DIFFERENTIAL/PLATELET
Abs Immature Granulocytes: 0.03 K/uL (ref 0.00–0.07)
Basophils Absolute: 0 K/uL (ref 0.0–0.1)
Basophils Relative: 1 %
Eosinophils Absolute: 0.1 K/uL (ref 0.0–0.5)
Eosinophils Relative: 2 %
HCT: 51 % (ref 39.0–52.0)
Hemoglobin: 16.9 g/dL (ref 13.0–17.0)
Immature Granulocytes: 1 %
Lymphocytes Relative: 20 %
Lymphs Abs: 1.2 K/uL (ref 0.7–4.0)
MCH: 31.9 pg (ref 26.0–34.0)
MCHC: 33.1 g/dL (ref 30.0–36.0)
MCV: 96.4 fL (ref 80.0–100.0)
Monocytes Absolute: 0.6 K/uL (ref 0.1–1.0)
Monocytes Relative: 9 %
Neutro Abs: 4.2 K/uL (ref 1.7–7.7)
Neutrophils Relative %: 67 %
Platelets: 119 K/uL — ABNORMAL LOW (ref 150–400)
RBC: 5.29 MIL/uL (ref 4.22–5.81)
RDW: 12.7 % (ref 11.5–15.5)
WBC: 6.1 K/uL (ref 4.0–10.5)
nRBC: 0 % (ref 0.0–0.2)

## 2024-03-30 LAB — MAGNESIUM: Magnesium: 2.5 mg/dL — ABNORMAL HIGH (ref 1.7–2.4)

## 2024-03-30 LAB — LIPID PANEL
Cholesterol: 120 mg/dL (ref 0–200)
HDL: 62 mg/dL (ref >40)
LDL Cholesterol: 44 mg/dL (ref 0–99)
Total CHOL/HDL Ratio: 1.9 ratio
Triglycerides: 72 mg/dL (ref <150)
VLDL: 14 mg/dL (ref 0–40)

## 2024-03-30 LAB — GLUCOSE, CAPILLARY: Glucose-Capillary: 157 mg/dL — ABNORMAL HIGH (ref 70–99)

## 2024-03-30 LAB — CBG MONITORING, ED
Glucose-Capillary: 102 mg/dL — ABNORMAL HIGH (ref 70–99)
Glucose-Capillary: 89 mg/dL (ref 70–99)

## 2024-03-30 LAB — EKG 12-LEAD

## 2024-03-30 LAB — TSH: TSH: 2.874 u[IU]/mL (ref 0.350–4.500)

## 2024-03-30 MED ORDER — SODIUM CHLORIDE 0.9 % IV SOLN: INTRAVENOUS | Status: AC

## 2024-03-30 MED ORDER — INSULIN ASPART 100 UNIT/ML IJ SOLN: 0.0000 [IU] | INTRAMUSCULAR | Status: DC

## 2024-03-30 MED ORDER — ONDANSETRON HCL 4 MG/2ML IJ SOLN: 4.0000 mg | Freq: Four times a day (QID) | INTRAMUSCULAR | Status: DC | PRN

## 2024-03-30 MED ORDER — INSULIN ASPART 100 UNIT/ML IJ SOLN: 0.0000 [IU] | Freq: Three times a day (TID) | INTRAMUSCULAR | Status: DC

## 2024-03-30 MED ORDER — ACETAMINOPHEN 650 MG RE SUPP: 650.0000 mg | Freq: Four times a day (QID) | RECTAL | Status: DC | PRN

## 2024-03-30 MED ORDER — FLUTICASONE PROPIONATE 50 MCG/ACT NA SUSP: 2.0000 | Freq: Every day | NASAL | Status: DC | PRN

## 2024-03-30 MED ORDER — ASPIRIN 81 MG PO TBEC
81.0000 mg | DELAYED_RELEASE_TABLET | Freq: Every day | ORAL | Status: DC
  Administered 2024-03-30: 81 mg via ORAL
  Filled 2024-03-30: qty 1

## 2024-03-30 MED ORDER — BUPROPION HCL ER (SR) 150 MG PO TB12
150.0000 mg | ORAL_TABLET | Freq: Two times a day (BID) | ORAL | Status: DC
Start: 2024-03-30 — End: 2024-03-31
  Administered 2024-03-30 – 2024-03-31 (×2): 150 mg via ORAL
  Filled 2024-03-30 (×3): qty 1

## 2024-03-30 MED ORDER — ONDANSETRON HCL 4 MG PO TABS: 4.0000 mg | ORAL_TABLET | Freq: Four times a day (QID) | ORAL | Status: DC | PRN

## 2024-03-30 MED ORDER — ATORVASTATIN CALCIUM 40 MG PO TABS
40.0000 mg | ORAL_TABLET | Freq: Every day | ORAL | Status: DC
  Administered 2024-03-30: 40 mg via ORAL
  Filled 2024-03-30: qty 1

## 2024-03-30 MED ORDER — TAMSULOSIN HCL 0.4 MG PO CAPS
0.8000 mg | ORAL_CAPSULE | Freq: Every day | ORAL | Status: DC
  Administered 2024-03-31: 0.8 mg via ORAL
  Filled 2024-03-30: qty 2

## 2024-03-30 MED ORDER — BUPROPION HCL ER (SR) 150 MG PO TB12
150.0000 mg | ORAL_TABLET | Freq: Two times a day (BID) | ORAL | 3 refills | Status: AC
  Filled 2024-03-30 – 2024-04-26 (×2): qty 180, 90d supply, fill #0
  Filled 2024-06-30 – 2024-07-12 (×3): qty 180, 90d supply, fill #1

## 2024-03-30 MED ORDER — ALBUTEROL SULFATE (2.5 MG/3ML) 0.083% IN NEBU: 2.5000 mg | INHALATION_SOLUTION | Freq: Four times a day (QID) | RESPIRATORY_TRACT | Status: DC | PRN

## 2024-03-30 MED ORDER — ENOXAPARIN SODIUM 40 MG/0.4ML IJ SOSY
40.0000 mg | PREFILLED_SYRINGE | INTRAMUSCULAR | Status: DC
  Administered 2024-03-30: 40 mg via SUBCUTANEOUS
  Filled 2024-03-30: qty 0.4

## 2024-03-30 MED ORDER — ACETAMINOPHEN 325 MG PO TABS: 650.0000 mg | ORAL_TABLET | Freq: Four times a day (QID) | ORAL | Status: DC | PRN

## 2024-03-30 MED ORDER — ALUM & MAG HYDROXIDE-SIMETH 200-200-20 MG/5ML PO SUSP
30.0000 mL | Freq: Four times a day (QID) | ORAL | Status: DC | PRN
  Administered 2024-03-30: 30 mL via ORAL
  Filled 2024-03-30: qty 30

## 2024-03-30 NOTE — H&P (Signed)
 History and Physical    Samuel Lawson FMW:[id number] DOB: 01-Nov-1945 DOA: 03/30/2024  PCP: Theotis Haze ORN, NP  Patient coming from: PCPs office  I have personally briefly reviewed patient's old medical records in [facility]  Chief Complaint: Dizziness  HPI: Samuel Lawson is a 78 y.o. male with medical history significant of hypertension, depression, diabetes and CAD went to see his PCP for routine workup.  He was found to have sinus bradycardia, endorsed having intermittent dizziness for quite some time so he was directed to ED.  Patient says that he has been having dizziness for few weeks now.  Even now in the ED, he is having intermittent dizziness.  Denies any shortness of breath, palpitation or shortness of breath.,  Denies fever, chills, sweating, nausea or any other complaint.  He has been taking Coreg  for 20 years.  ED Course: Upon arrival to ED, patient was bradycardic.  Blood pressure fairly stable.  Otherwise hemodynamically stable.  EKG showed possible high-grade heart block.  Cardiology consulted.  Review of Systems: As per HPI otherwise negative.    Past Medical History:  Diagnosis Date   Acute MI [facility]) 2009   STENTS PLACED   Chronic kidney disease    Collagen vascular disease (HCC)    Hyperlipidemia    Hypertension    Pre-diabetes    Prediabetes     Past Surgical History:  Procedure Laterality Date   ANGIOPLASTY     CARDIAC CATHETERIZATION     INCISIONAL HERNIA REPAIR N/A 05/02/2022   Procedure: LAPAROSCOPIC INCISIONAL HERNIA REPAIR WITH MESH;  Surgeon: Vernetta Berg, MD;  Location: [facility];  Service: General;  Laterality: N/A;   ROBOTIC ASSITED PARTIAL NEPHRECTOMY Left 12/05/2021   Procedure: XI ROBOTIC ASSITED PARTIAL NEPHRECTOMY;  Surgeon: Cam Morene ORN, MD;  Location: [facility];  Service: Urology;  Laterality: Left;  3.5 HRS     reports that he has been smoking cigarettes. He started smoking about 51 years ago. He has a 51.6 pack-year smoking  history. He has never used smokeless tobacco. He reports that he does not drink alcohol and does not use drugs.  No Known Allergies  Family History  Problem Relation Age of Onset   Heart Problems Mother    Stroke Father    Alzheimer's disease Father    High blood pressure Father    Colon cancer Neg Hx    Colon polyps Neg Hx    Esophageal cancer Neg Hx    Rectal cancer Neg Hx    Stomach cancer Neg Hx     Prior to Admission medications   Medication Sig Start Date End Date Taking? Authorizing Provider  albuterol  (VENTOLIN  HFA) 108 (90 Base) MCG/ACT inhaler Inhale 1-2 puffs into the lungs every 6 (six) hours as needed. 07/04/23   Theotis Haze ORN, NP  aspirin  EC 81 MG tablet Take 81 mg by mouth at bedtime. Swallow whole.    [provider]  atorvastatin  (LIPITOR) 40 MG tablet Take 1 tablet (40 mg total) by mouth at bedtime. 07/04/23   Fleming, Zelda W, NP  Blood Glucose Calibration (ACCU-CHEK GUIDE CONTROL) LIQD 1 each by In Vitro route once as needed for up to 1 dose. R73.03 01/07/20   Fleming, Zelda W, NP  buPROPion  (WELLBUTRIN  SR) 150 MG 12 hr tablet Take 1 tablet (150 mg total) by mouth 2 (two) times daily. 03/30/24   Fleming, Zelda W, NP  carvedilol  (COREG ) 3.125 MG tablet Take 1 tablet (3.125 mg total) by mouth 2 (two)  times daily with a meal. Please schedule appt with Zelda Fleming. 12/16/23   Newlin, Enobong, MD  Cholecalciferol  (VITAMIN D3) 125 MCG (5000 UT) CAPS Take 5,000 Units by mouth daily. When compliant    [provider]  ciclopirox  (PENLAC ) 8 % solution Apply topically at bedtime. Apply over nail and surrounding skin. Apply daily over previous coat. After seven (7) days, may remove with alcohol and continue cycle. Patient not taking: Reported on 03/30/2024 05/09/23   Fleming, Zelda W, NP  clopidogrel  (PLAVIX ) 75 MG tablet Take 75 mg by mouth once. Patient not taking: Reported on 03/30/2024 11/15/16   [provider]  diclofenac  Sodium (VOLTAREN ) 1 % GEL  Apply 2 g topically 4 (four) times daily. For joint pain Patient not taking: Reported on 03/30/2024 10/31/22   Fleming, Zelda W, NP  fluticasone  (FLONASE ) 50 MCG/ACT nasal spray Place 2 sprays into both nostrils daily. 07/04/23   Fleming, Zelda W, NP  glucose blood (ACCU-CHEK GUIDE) test strip Use as instructed. Check blood glucose by fingerstick once per day. R73.03 10/23/21   Newlin, Enobong, MD  icosapent  Ethyl (VASCEPA ) 1 g capsule Take 1 capsule (1 g total) by mouth 2 (two) times daily. Please schedule appt with Haze Servant. 03/02/24   Fleming, Zelda W, NP  Multiple Vitamins-Minerals (MULTIVITAMIN WITH MINERALS) tablet Take 1 tablet by mouth daily.    [provider]  Multiple Vitamins-Minerals (PRESERVISION AREDS PO) Take by mouth.    [provider]  mupirocin  ointment (BACTROBAN ) 2 % Apply 1 Application topically 2 (two) times daily. Apply to right cheek Patient not taking: Reported on 03/30/2024 07/04/23   Fleming, Zelda W, NP  ondansetron  (ZOFRAN -ODT) 8 MG disintegrating tablet Take 8 mg by mouth every 8 (eight) hours as needed. Patient not taking: Reported on 03/30/2024 12/07/21   [provider]  senna-docusate (SENOKOT-S) 8.6-50 MG tablet Take 1 tablet by mouth daily. For constipation 07/30/22   Servant Haze ORN, NP  tamsulosin  (FLOMAX ) 0.4 MG CAPS capsule Take 2 capsules (0.8 mg total) by mouth daily. Please schedule appt with Zelda Fleming. 03/23/24   Newlin, Enobong, MD  promethazine  (PHENERGAN ) 25 MG tablet Take 1 tablet (25 mg total) by mouth every 6 (six) hours as needed for nausea. 06/18/12 12/07/12  Marzella Craven, PA-C    Physical Exam: Vitals:   03/30/24 1204 03/30/24 1221 03/30/24 1230 03/30/24 1245  BP:   134/68 (!) 141/74  Pulse:   (!) 43 (!) 39  Resp:   19 19  Temp:  97.8 F (36.6 C)    TempSrc:      SpO2:   99% 98%  Weight: 81.6 kg     Height: 5' 8 (1.727 m)       Constitutional: NAD, calm, comfortable Vitals:   03/30/24 1204  03/30/24 1221 03/30/24 1230 03/30/24 1245  BP:   134/68 (!) 141/74  Pulse:   (!) 43 (!) 39  Resp:   19 19  Temp:  97.8 F (36.6 C)    TempSrc:      SpO2:   99% 98%  Weight: 81.6 kg     Height: 5' 8 (1.727 m)      Eyes: PERRL, lids and conjunctivae normal ENMT: Mucous membranes are moist. Posterior pharynx clear of any exudate or lesions.Normal dentition.  Neck: normal, supple, no masses, no thyromegaly Respiratory: clear to auscultation bilaterally, no wheezing, no crackles. Normal respiratory effort. No accessory muscle use.  Cardiovascular: Sinus bradycardia, no murmurs / rubs / gallops. No extremity  edema. 2+ pedal pulses. No carotid bruits.  Abdomen: no tenderness, no masses palpated. No hepatosplenomegaly. Bowel sounds positive.  Musculoskeletal: no clubbing / cyanosis. No joint deformity upper and lower extremities. Good ROM, no contractures. Normal muscle tone.  Skin: no rashes, lesions, ulcers. No induration Neurologic: CN 2-12 grossly intact. Sensation intact, DTR normal. Strength 5/5 in all 4.  Psychiatric: Normal judgment and insight. Alert and oriented x 3. Normal mood.    Labs on Admission: I have personally reviewed following labs and imaging studies  CBC: Recent Labs  Lab 03/30/24 1221  WBC 6.1  NEUTROABS 4.2  HGB 16.9  HCT 51.0  MCV 96.4  PLT 119*   Basic Metabolic Panel: Recent Labs  Lab 03/30/24 1221  NA 140  K 4.4  CL 106  CO2 23  GLUCOSE 114*  BUN 14  CREATININE 1.51*  CALCIUM  9.1  MG 2.5*   GFR: Estimated Creatinine Clearance: 40.3 mL/min (A) (by C-G formula based on SCr of 1.51 mg/dL (H)). Liver Function Tests: Recent Labs  Lab 03/30/24 1221  AST 22  ALT 11  ALKPHOS 65  BILITOT 1.6*  PROT 7.7  ALBUMIN 4.4   No results for input(s): LIPASE, AMYLASE in the last 168 hours. No results for input(s): AMMONIA in the last 168 hours. Coagulation Profile: No results for input(s): INR, PROTIME in the last 168 hours. Cardiac  Enzymes: No results for input(s): CKTOTAL, CKMB, CKMBINDEX, TROPONINI in the last 168 hours. BNP (last 3 results) No results for input(s): PROBNP in the last 8760 hours. HbA1C: No results for input(s): HGBA1C in the last 72 hours. CBG: No results for input(s): GLUCAP in the last 168 hours. Lipid Profile: No results for input(s): CHOL, HDL, LDLCALC, TRIG, CHOLHDL, LDLDIRECT in the last 72 hours. Thyroid  Function Tests: No results for input(s): TSH, T4TOTAL, FREET4, T3FREE, THYROIDAB in the last 72 hours. Anemia Panel: No results for input(s): VITAMINB12, FOLATE, FERRITIN, TIBC, IRON, RETICCTPCT in the last 72 hours. Urine analysis:    Component Value Date/Time   COLORURINE YELLOW 04/20/2022 2248   APPEARANCEUR CLEAR 04/20/2022 2248   LABSPEC 1.006 04/20/2022 2248   PHURINE 5.0 04/20/2022 2248   GLUCOSEU NEGATIVE 04/20/2022 2248   HGBUR NEGATIVE 04/20/2022 2248   BILIRUBINUR NEGATIVE 04/20/2022 2248   BILIRUBINUR neg 07/19/2015 1014   KETONESUR NEGATIVE 04/20/2022 2248   PROTEINUR NEGATIVE 04/20/2022 2248   UROBILINOGEN 0.2 07/19/2015 1014   UROBILINOGEN 0.2 08/11/2013 1715   NITRITE NEGATIVE 04/20/2022 2248   LEUKOCYTESUR NEGATIVE 04/20/2022 2248    Radiological Exams on Admission: DG Chest Portable 1 View Result Date: 03/30/2024 CLINICAL DATA:  Weakness. EXAM: PORTABLE CHEST 1 VIEW COMPARISON:  March 17, 2024. FINDINGS: The heart size and mediastinal contours are within normal limits. Both lungs are clear. The visualized skeletal structures are unremarkable. IMPRESSION: No active disease. Electronically Signed   By: Lynwood Landy Raddle M.D.   On: 03/30/2024 13:38    EKG: Independently reviewed.  High degree heart block  Assessment/Plan Principal Problem:   Symptomatic bradycardia   Symptomatic sinus bradycardia/?  Heart block: Patient symptomatic prior to coming.  Currently no symptoms.  Cardiology on board.  Patient was on Coreg   PTA.  Cardiology plans to platelet washout and monitor him overnight before planning for any intervention.  Keeping him n.p.o. in case he needs urgent intervention.  AKI: Baseline creatinine appears to be between 1-1.2, currently 1.51.  Will hydrate with normal saline and recheck labs in the morning.  Type 2 diabetes mellitus: Appears  to be using sliding scale insulin  at home.  Recent hemoglobin A1c from 5 months ago 5.9.  Repeat check hemoglobin A1c.  Start on SSI.  Hyperlipidemia: Resume statin.  Depression: Resume Wellbutrin .  BPH: Resume Flomax   DVT prophylaxis: enoxaparin  (LOVENOX ) injection 40 mg Start: 03/30/24 1345 Code Status: Full code Family Communication: None present at bedside.  Plan of care discussed with patient in length and he verbalized understanding and agreed with it. Disposition Plan: May discharge in 1 to 2 days Consults called: Cardiology  Fredia Skeeter MD [facility]  *Please note that this is a verbal dictation therefore any spelling or grammatical errors are due to the Dragon Medical One system interpretation.  Please page via Amion and do not message via secure chat for urgent patient care matters. Secure chat can be used for non urgent patient care matters. 03/30/2024, 1:44 PM  To contact the attending provider between 7A-7P or the covering provider during after hours 7P-7A, please log into the web site [email or web address]

## 2024-03-30 NOTE — ED Provider Notes (Signed)
 [place] EMERGENCY DEPARTMENT AT [facility] Provider Note   CSN: [id number] Arrival date & time: 03/30/24  1157     Patient presents with: Bradycardia   Samuel Lawson is a 78 y.o. male.   Patient arrives from primary care doctor's office with bradycardia.  Samuel Lawson was therefore regular follow-up.  Found to be bradycardic.  Sent here for evaluation.  Samuel Lawson does endorse some dizziness on and off for the last several months.  Was dizzy walking across the parking lot today.  Denies any chest pain shortness of breath weakness numbness tingling.  Patient does take Coreg .  History of hypertension high cholesterol CAD.  History of CKD as well.  The history is provided by the patient.       Prior to Admission medications   Medication Sig Start Date End Date Taking? Authorizing Provider  albuterol  (VENTOLIN  HFA) 108 (90 Base) MCG/ACT inhaler Inhale 1-2 puffs into the lungs every 6 (six) hours as needed. 07/04/23   Theotis Haze ORN, NP  aspirin  EC 81 MG tablet Take 81 mg by mouth at bedtime. Swallow whole.    [provider]  atorvastatin  (LIPITOR) 40 MG tablet Take 1 tablet (40 mg total) by mouth at bedtime. 07/04/23   Fleming, Zelda W, NP  Blood Glucose Calibration (ACCU-CHEK GUIDE CONTROL) LIQD 1 each by In Vitro route once as needed for up to 1 dose. R73.03 01/07/20   Fleming, Zelda W, NP  buPROPion  (WELLBUTRIN  SR) 150 MG 12 hr tablet Take 1 tablet (150 mg total) by mouth 2 (two) times daily. 03/30/24   Fleming, Zelda W, NP  carvedilol  (COREG ) 3.125 MG tablet Take 1 tablet (3.125 mg total) by mouth 2 (two) times daily with a meal. Please schedule appt with Zelda Fleming. 12/16/23   Newlin, Enobong, MD  Cholecalciferol  (VITAMIN D3) 125 MCG (5000 UT) CAPS Take 5,000 Units by mouth daily. When compliant    [provider]  ciclopirox  (PENLAC ) 8 % solution Apply topically at bedtime. Apply over nail and surrounding skin. Apply daily over previous coat. After seven (7) days,  may remove with alcohol and continue cycle. Patient not taking: Reported on 03/30/2024 05/09/23   Fleming, Zelda W, NP  clopidogrel  (PLAVIX ) 75 MG tablet Take 75 mg by mouth once. Patient not taking: Reported on 03/30/2024 11/15/16   [provider]  diclofenac  Sodium (VOLTAREN ) 1 % GEL Apply 2 g topically 4 (four) times daily. For joint pain Patient not taking: Reported on 03/30/2024 10/31/22   Fleming, Zelda W, NP  fluticasone  (FLONASE ) 50 MCG/ACT nasal spray Place 2 sprays into both nostrils daily. 07/04/23   Fleming, Zelda W, NP  glucose blood (ACCU-CHEK GUIDE) test strip Use as instructed. Check blood glucose by fingerstick once per day. R73.03 10/23/21   Newlin, Enobong, MD  icosapent  Ethyl (VASCEPA ) 1 g capsule Take 1 capsule (1 g total) by mouth 2 (two) times daily. Please schedule appt with Haze Theotis. 03/02/24   Fleming, Zelda W, NP  Multiple Vitamins-Minerals (MULTIVITAMIN WITH MINERALS) tablet Take 1 tablet by mouth daily.    [provider]  Multiple Vitamins-Minerals (PRESERVISION AREDS PO) Take by mouth.    [provider]  mupirocin  ointment (BACTROBAN ) 2 % Apply 1 Application topically 2 (two) times daily. Apply to right cheek Patient not taking: Reported on 03/30/2024 07/04/23   Theotis Haze ORN, NP  ondansetron  (ZOFRAN -ODT) 8 MG disintegrating tablet Take 8 mg by mouth every 8 (eight) hours as needed. Patient not taking: Reported on 03/30/2024  12/07/21   [provider]  senna-docusate (SENOKOT-S) 8.6-50 MG tablet Take 1 tablet by mouth daily. For constipation 07/30/22   Theotis Haze ORN, NP  tamsulosin  (FLOMAX ) 0.4 MG CAPS capsule Take 2 capsules (0.8 mg total) by mouth daily. Please schedule appt with Zelda Fleming. 03/23/24   Newlin, Enobong, MD  promethazine  (PHENERGAN ) 25 MG tablet Take 1 tablet (25 mg total) by mouth every 6 (six) hours as needed for nausea. 06/18/12 12/07/12  Schinlever, Dorothyann, PA-C    Allergies: Patient has no known  allergies.    Review of Systems  Updated Vital Signs BP (!) 141/74   Pulse (!) 39   Temp 97.8 F (36.6 C)   Resp 19   Ht 5' 8 (1.727 m)   Wt 81.6 kg   SpO2 98%   BMI 27.37 kg/m   Physical Exam Vitals and nursing note reviewed.  Constitutional:      General: Samuel Lawson is not in acute distress.    Appearance: Samuel Lawson is well-developed. Samuel Lawson is not ill-appearing.  HENT:     Head: Normocephalic and atraumatic.     Nose: Nose normal.     Mouth/Throat:     Mouth: Mucous membranes are moist.  Eyes:     Extraocular Movements: Extraocular movements intact.     Conjunctiva/sclera: Conjunctivae normal.     Pupils: Pupils are equal, round, and reactive to light.  Cardiovascular:     Rate and Rhythm: Regular rhythm. Bradycardia present.     Pulses: Normal pulses.     Heart sounds: No murmur heard. Pulmonary:     Effort: Pulmonary effort is normal. No respiratory distress.     Breath sounds: Normal breath sounds.  Abdominal:     General: Abdomen is flat.     Palpations: Abdomen is soft.     Tenderness: There is no abdominal tenderness.  Musculoskeletal:        General: No swelling.     Cervical back: Normal range of motion and neck supple.  Skin:    General: Skin is warm and dry.     Capillary Refill: Capillary refill takes less than 2 seconds.  Neurological:     General: No focal deficit present.     Mental Status: Samuel Lawson is alert.  Psychiatric:        Mood and Affect: Mood normal.     (all labs ordered are listed, but only abnormal results are displayed) Labs Reviewed  CBC WITH DIFFERENTIAL/PLATELET - Abnormal; Notable for the following components:      Result Value   Platelets 119 (*)    All other components within normal limits  COMPREHENSIVE METABOLIC PANEL WITH GFR - Abnormal; Notable for the following components:   Glucose, Bld 114 (*)    Creatinine, Ser 1.51 (*)    Total Bilirubin 1.6 (*)    GFR, Estimated 48 (*)    All other components within normal limits  MAGNESIUM -  Abnormal; Notable for the following components:   Magnesium 2.5 (*)    All other components within normal limits    EKG: EKG Interpretation Date/Time:  Tuesday March 30 2024 12:02:57 EDT Ventricular Rate:  36 PR Interval:    QRS Duration:  86 QT Interval:  480 QTC Calculation: 371 R Axis:   59  Text Interpretation: high degree heart block ? profound bradycardia Otherwise normal ECG When compared with ECG of 07-Dec-2021 08:35, PREVIOUS ECG IS PRESENT Confirmed by Ruthe Cornet [phone]) on 03/30/2024 12:46:35 PM  Radiology: No results found.  Procedures   Medications Ordered in the ED - No data to display                                  Medical Decision Making Amount and/or Complexity of Data Reviewed Labs: ordered. Radiology: ordered.   Autrey Human is here with bradycardia.  Sent here by primary care doctor found to be profoundly bradycardic there during visit today.  Samuel Lawson is endorse some intermittent dizziness here the last few weeks felt like Samuel Lawson was going to pass out at times but has not.  Samuel Lawson felt lightheaded on the way in here today.  Samuel Lawson took his morning medications including his Coreg .  Samuel Lawson has a history of CAD hypertension high cholesterol.  His hemodynamics otherwise unremarkable.  EKG is concerning for possibly a high degree heart block.  Could be a very prolonged first degree.  I had Dr. Sheena cardiology review and ultimately will get EP team involved but at this time can hold his medications including his Coreg  to see if that helps improve things.  Samuel Lawson might just be overmedicated.  Could be slow A-fib.  Overall Samuel Lawson is not having any chest pain shortness of breath and is overall asymptomatic here.  Will check basic labs chest x-ray and admit him for further care.  I reviewed interpreted EKG.  Per my review and interpretation of labs no significant leukocytosis anemia or electrolyte abnormality.  Samuel Lawson remains hemodynamically stable.  Chest x-ray did not show any obvious  pneumothorax or volume overload or pneumonia.  Overall we will admit to hospitalist for further care.  Cardiology to follow.  This chart was dictated using voice recognition software.  Despite best efforts to proofread,  errors can occur which can change the documentation meaning.      Final diagnoses:  Symptomatic bradycardia    ED Discharge Orders     None          Ruthe Cornet, DO 03/30/24 1326

## 2024-03-30 NOTE — Consult Note (Signed)
 Cardiology Consultation   Patient ID: Samuel Lawson MRN: [id number]; DOB: 07/14/1946  Admit date: 03/30/2024 Date of Consult: 03/30/2024  PCP:  Samuel Lawson ORN, Samuel Lawson   [facility] Providers Cardiologist:  Shelda Bruckner, Samuel Lawson      Patient Profile: Samuel Lawson is a 78 y.o. male with a hx of coronary artery disease with 2 stents placed in 2001, hypertension, hyperlipidemia, and tobacco use who is being seen 03/30/2024 for the evaluation of bradycardia at the request of Samuel Cornet DO.  History of Present Illness: Samuel Lawson is a 78 year old male who has a remote history of CAD and had 2 prior stents placed back in 2021.  Echo in 2016 showed a normal LVEF of 60 to 65%, moderate LVH, normal RV function, grossly normal valve function.  Patient presented to PCP for a regular office visit follow-up and was found to be bradycardic with heart rates in the 30s.  Because of this was referred to the emergency department.   Chest x-ray showed no active disease.  EKG showed sinus bradycardia with a rate of 34, and a prolonged AV conduction. Labs showed potassium of 4.4, creatinine of 1.51 baseline appears to be about 1.2, magnesium of 2.5, and normal hemoglobin of 16.9.  On interview reported that he has felt lightheaded, dizzy, and nearly passed out several times over the past 3 to 4 months.  Has had intermittent shortness of breath over the past 2 to 3 months.  Denies having any loss of consciousness, orthopnea, fever, chills, nausea, vomiting, diaphoresis, melena, hemauria, and hematochezia.  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  Able to go shopping and walk around the store.  Does not carry heavy objects because of his hernia.  Has a surgical procedure planned for hernia repair. Denies alcohol use or illicit substance use.  Currently smokes cigarettes.  The patient's PCP is prescribing Wellbutrin  in the patient is trying to quit smoking.   Past  Medical History:  Diagnosis Date   Acute MI [facility]) 2009   STENTS PLACED   Chronic kidney disease    Collagen vascular disease (HCC)    Hyperlipidemia    Hypertension    Pre-diabetes    Prediabetes     Past Surgical History:  Procedure Laterality Date   ANGIOPLASTY     CARDIAC CATHETERIZATION     INCISIONAL HERNIA REPAIR N/A 05/02/2022   Procedure: LAPAROSCOPIC INCISIONAL HERNIA REPAIR WITH MESH;  Surgeon: Samuel Berg, Samuel Lawson;  Location: [facility];  Service: General;  Laterality: N/A;   ROBOTIC ASSITED PARTIAL NEPHRECTOMY Left 12/05/2021   Procedure: XI ROBOTIC ASSITED PARTIAL NEPHRECTOMY;  Surgeon: Samuel Morene ORN, Samuel Lawson;  Location: [facility];  Service: Urology;  Laterality: Left;  3.5 HRS     Home Medications:  Prior to Admission medications   Medication Sig Start Date End Date Taking? Authorizing Provider  albuterol  (VENTOLIN  HFA) 108 (90 Base) MCG/ACT inhaler Inhale 1-2 puffs into the lungs every 6 (six) hours as needed. 07/04/23   Samuel Lawson ORN, Samuel Lawson  aspirin  EC 81 MG tablet Take 81 mg by mouth at bedtime. Swallow whole.    Provider, Historical, Samuel Lawson  atorvastatin  (LIPITOR) 40 MG tablet Take 1 tablet (40 mg total) by mouth at bedtime. 07/04/23   Samuel Lawson, Samuel Lawson, Samuel Lawson  Blood Glucose Calibration (ACCU-CHEK GUIDE CONTROL) LIQD 1 each by In Vitro route once as needed for up to 1 dose. R73.03 01/07/20   Samuel Lawson, Samuel Lawson, Samuel Lawson  buPROPion  (WELLBUTRIN  SR) 150  MG 12 hr tablet Take 1 tablet (150 mg total) by mouth 2 (two) times daily. 03/30/24   Samuel Lawson, Samuel Lawson, Samuel Lawson  carvedilol  (COREG ) 3.125 MG tablet Take 1 tablet (3.125 mg total) by mouth 2 (two) times daily with a meal. Please schedule appt with Samuel Samuel Lawson. 12/16/23   Samuel Lawson, Enobong, Samuel Lawson  Cholecalciferol  (VITAMIN D3) 125 MCG (5000 UT) CAPS Take 5,000 Units by mouth daily. When compliant    Provider, Historical, Samuel Lawson  ciclopirox  (PENLAC ) 8 % solution Apply topically at bedtime. Apply over nail and surrounding skin. Apply daily over previous coat.  After seven (7) days, may remove with alcohol and continue cycle. Patient not taking: Reported on 03/30/2024 05/09/23   Samuel Lawson, Samuel Lawson, Samuel Lawson  clopidogrel  (PLAVIX ) 75 MG tablet Take 75 mg by mouth once. Patient not taking: Reported on 03/30/2024 11/15/16   Provider, Historical, Samuel Lawson  diclofenac  Sodium (VOLTAREN ) 1 % GEL Apply 2 g topically 4 (four) times daily. For joint pain Patient not taking: Reported on 03/30/2024 10/31/22   Samuel Lawson, Samuel Lawson, Samuel Lawson  fluticasone  (FLONASE ) 50 MCG/ACT nasal spray Place 2 sprays into both nostrils daily. 07/04/23   Samuel Lawson, Samuel Lawson, Samuel Lawson  glucose blood (ACCU-CHEK GUIDE) test strip Use as instructed. Check blood glucose by fingerstick once per day. R73.03 10/23/21   Samuel Lawson, Enobong, Samuel Lawson  icosapent  Ethyl (VASCEPA ) 1 g capsule Take 1 capsule (1 g total) by mouth 2 (two) times daily. Please schedule appt with Lawson Samuel. 03/02/24   Samuel Lawson, Samuel Lawson, Samuel Lawson  Multiple Vitamins-Minerals (MULTIVITAMIN WITH MINERALS) tablet Take 1 tablet by mouth daily.    Provider, Historical, Samuel Lawson  Multiple Vitamins-Minerals (PRESERVISION AREDS PO) Take by mouth.    Provider, Historical, Samuel Lawson  mupirocin  ointment (BACTROBAN ) 2 % Apply 1 Application topically 2 (two) times daily. Apply to right cheek Patient not taking: Reported on 03/30/2024 07/04/23   Samuel Lawson ORN, Samuel Lawson  ondansetron  (ZOFRAN -ODT) 8 MG disintegrating tablet Take 8 mg by mouth every 8 (eight) hours as needed. Patient not taking: Reported on 03/30/2024 12/07/21   Provider, Historical, Samuel Lawson  senna-docusate (SENOKOT-S) 8.6-50 MG tablet Take 1 tablet by mouth daily. For constipation 07/30/22   Samuel Lawson ORN, Samuel Lawson  tamsulosin  (FLOMAX ) 0.4 MG CAPS capsule Take 2 capsules (0.8 mg total) by mouth daily. Please schedule appt with Samuel Samuel Lawson. 03/23/24   Samuel Lawson, Enobong, Samuel Lawson  promethazine  (PHENERGAN ) 25 MG tablet Take 1 tablet (25 mg total) by mouth every 6 (six) hours as needed for nausea. 06/18/12 12/07/12  Samuel Lawson, Dorothyann, Samuel Lawson    Scheduled  Meds:  Continuous Infusions:  sodium chloride      PRN Meds:   Allergies:   No Known Allergies  Social History:   Social History   Socioeconomic History   Marital status: Single    Spouse name: Not on file   Number of children: 2   Years of education: B.S.   Highest education level: Not on file  Occupational History    Employer: SELF EMPLOYED    Comment: Computers - Works for [employer]  Tobacco Use   Smoking status: Some Days    Current packs/day: 1.00    Average packs/day: 1 pack/day for 51.6 years (51.6 ttl pk-yrs)    Types: Cigarettes    Start date: 09/09/1972   Smokeless tobacco: Never   Tobacco comments:    states new years resolution is to quit smoking  Vaping Use   Vaping status: Never Used  Substance and Sexual Activity   Alcohol use: No   Drug use:  No   Sexual activity: Not on file  Other Topics Concern   Not on file  Social History Narrative   Patient lives at home alone and he is single. Reports being married in the past. Patient is self employed and works with computers.   Education B.S.   Right handed.   Caffeine one cup of tea daily and two pepsi daily.   Social Drivers of Corporate investment banker Strain: Low Risk  (04/22/2023)   Overall Financial Resource Strain (CARDIA)    Difficulty of Paying Living Expenses: Not hard at all  Food Insecurity: No Food Insecurity (04/22/2023)   Hunger Vital Sign    Worried About Running Out of Food in the Last Year: Never true    Ran Out of Food in the Last Year: Never true  Transportation Needs: No Transportation Needs (04/22/2023)   PRAPARE - Administrator, Civil Service (Medical): No    Lack of Transportation (Non-Medical): No  Physical Activity: Insufficiently Active (04/22/2023)   Exercise Vital Sign    Days of Exercise per Week: 2 days    Minutes of Exercise per Session: 30 min  Stress: No Stress Concern Present (07/04/2023)   [employer] of Occupational Health - Occupational Stress  Questionnaire    Feeling of Stress : Not at all  Social Connections: Moderately Isolated (04/22/2023)   Social Connection and Isolation Panel    Frequency of Communication with Friends and Family: Once a week    Frequency of Social Gatherings with Friends and Family: Three times a week    Attends Religious Services: Never    Active Member of Clubs or Organizations: Yes    Attends Banker Meetings: Never    Marital Status: Divorced  Catering manager Violence: Not At Risk (08/30/2022)   Humiliation, Afraid, Rape, and Kick questionnaire    Fear of Current or Ex-Partner: No    Emotionally Abused: No    Physically Abused: No    Sexually Abused: No    Family History:    Family History  Problem Relation Age of Onset   Heart Problems Mother    Stroke Father    Alzheimer's disease Father    High blood pressure Father    Colon cancer Neg Hx    Colon polyps Neg Hx    Esophageal cancer Neg Hx    Rectal cancer Neg Hx    Stomach cancer Neg Hx      ROS:  Please see the history of present illness.   All other ROS reviewed and negative.     Physical Exam/Data: Vitals:   03/30/24 1204 03/30/24 1221 03/30/24 1230 03/30/24 1245  BP:   134/68 (!) 141/74  Pulse:   (!) 43 (!) 39  Resp:   19 19  Temp:  97.8 F (36.6 C)    TempSrc:      SpO2:   99% 98%  Weight: 81.6 kg     Height: 5' 8 (1.727 m)      No intake or output data in the 24 hours ending 03/30/24 1334    03/30/2024   12:04 PM 03/30/2024   10:09 AM 07/04/2023    9:47 AM  Last 3 Weights  Weight (lbs) 180 lb 182 lb 12.8 oz 194 lb 3.2 oz  Weight (kg) 81.647 kg 82.918 kg 88.089 kg     Body mass index is 27.37 kg/m.  General:  Well nourished, well developed, in no acute distress and laying in bed.  Reported feeling lightheaded when sitting up. HEENT: normal Neck: no JVD Vascular: No carotid bruits; Distal pulses 2+ bilaterally Cardiac:  normal S1, S2; RRR; no murmur  Lungs:  clear to auscultation bilaterally,  no wheezing, rhonchi or rales  Abd: soft, nontender, no hepatomegaly  Ext: no edema Musculoskeletal:  No deformities, BUE and BLE strength normal and equal Skin: warm and dry  Neuro:  no focal abnormalities noted Psych:  Normal affect   EKG:  The EKG was personally reviewed and demonstrates: Sinus bradycardia with a rate of 34, and a prolonged AV conduction. Telemetry:  Telemetry was personally reviewed and demonstrates: Sinus bradycardia with heart rates in the 30s and 40s. first-degree AV block. Complete heart block seen on telemetry.  Relevant CV Studies: Echo pending  Laboratory Data: High Sensitivity Troponin:  No results for input(s): TROPONINIHS in the last 720 hours.   Chemistry Recent Labs  Lab 03/30/24 1221  NA 140  K 4.4  CL 106  CO2 23  GLUCOSE 114*  BUN 14  CREATININE 1.51*  CALCIUM  9.1  MG 2.5*  GFRNONAA 48*  ANIONGAP 11    Recent Labs  Lab 03/30/24 1221  PROT 7.7  ALBUMIN 4.4  AST 22  ALT 11  ALKPHOS 65  BILITOT 1.6*   Lipids No results for input(s): CHOL, TRIG, HDL, LABVLDL, LDLCALC, CHOLHDL in the last 168 hours.  Hematology Recent Labs  Lab 03/30/24 1221  WBC 6.1  RBC 5.29  HGB 16.9  HCT 51.0  MCV 96.4  MCH 31.9  MCHC 33.1  RDW 12.7  PLT 119*   Thyroid  No results for input(s): TSH, FREET4 in the last 168 hours.  BNPNo results for input(s): BNP, PROBNP in the last 168 hours.  DDimer No results for input(s): DDIMER in the last 168 hours.  Radiology/Studies:  No results found.   Assessment and Plan: Cristofher Livecchi is a 78 y.o. male with a hx of coronary artery disease with 2 stents placed in 2001, hypertension, hyperlipidemia, and tobacco use who is being seen 03/30/2024 for the evaluation of bradycardia at the request of Samuel Cornet DO.   Complete heart block Bradycardia Lightheadedness Near syncope Patient presented to PCP for a regular office visit follow-up and was found to be bradycardic with heart  rates in the 30s.  He has felt lightheaded, dizzy, and nearly passed out several times over the past 3 to 4 months.  He is fairly active at work and is on his feet and walking most of the day.  Is able to walk around large grocery stores without getting short of breath.  Is able to do more than 4 metabolic equivalents of exertion but does not lift and carry heavier objects because of his hernia.  Potassium 4.4, magnesium 2.5, sodium 140.  No significant electrolyte abnormalities.  TSH normal at 2.874.  Chest x-ray found no acute abnormalities.  Stop Coreg .  Will avoid AV nodal agents.  Will continue to monitor heart rate after stopping Coreg  Consult EP. Order echocardiogram   Coronary artery disease Hyperlipidemia 2 stents were placed in 2001.  Denies having any chest pain in the past 3 to 4 months.  Chest CT on 09/2022 found coronary artery calcifications. LDL was 64 on 10/2022. Continue Lipitor 40 mg daily Continue aspirin  81 mg daily   Hypertension Most recent blood pressure was 141/74.  Stop Coreg .    Tobacco abuse Currently smokes cigarettes.  The patient's PCP is prescribing Wellbutrin  and the patient is trying to quit smoking.  Otherwise manage per primary     Risk Assessment/Risk Scores:              For questions or updates, please contact [facility] Please consult [email or web address] for contact info under    Signed, Morse Clause, Samuel Lawson  03/30/2024 1:34 PM

## 2024-03-30 NOTE — Patient Instructions (Addendum)
 Dr. Shelda Bruckner [address]  [phone]

## 2024-03-30 NOTE — Progress Notes (Signed)
 Assessment & Plan:  Samuel Lawson was seen today for hypertension.  Diagnoses and all orders for this visit:  Primary hypertension  Encounter for screening for lung cancer -     CT CHEST LUNG CA SCREEN LOW DOSE W/O CM; Future  Coronary artery disease involving native coronary artery of native heart without angina pectoris -     Lipid panel  Prediabetes -     Hemoglobin A1c  Tobacco dependence -     buPROPion  (WELLBUTRIN  SR) 150 MG 12 hr tablet; Take 1 tablet (150 mg total) by mouth 2 (two) times daily.  Bradycardia -     EKG 12-Lead    Patient has been counseled on age-appropriate routine health concerns for screening and prevention. These are reviewed and up-to-date. Referrals have been placed accordingly. Immunizations are up-to-date or declined.    Subjective:   Chief Complaint  Patient presents with   Hypertension    Samuel Lawson 78 y.o. male presents to office today for folllow up to HTN  He is severely bradycardic in the 30s today. EKG confirmed. He is asymptomatic currently but does endorse intermittent dizziness over the past several weeks.  With history of MI and coronary stents he will need to be evaluated in the ED urgently See MEDIA for EKG.      Review of Systems  Constitutional:  Negative for fever, malaise/fatigue and weight loss.  HENT: Negative.  Negative for nosebleeds.   Eyes: Negative.  Negative for blurred vision, double vision and photophobia.  Respiratory: Negative.  Negative for cough and shortness of breath.   Cardiovascular: Negative.  Negative for chest pain, palpitations and leg swelling.  Gastrointestinal: Negative.  Negative for heartburn, nausea and vomiting.  Musculoskeletal: Negative.  Negative for myalgias.  Neurological:  Positive for dizziness. Negative for focal weakness, seizures and headaches.  Psychiatric/Behavioral: Negative.  Negative for suicidal ideas.     Past Medical History:  Diagnosis Date   Acute MI [facility]) 2009    STENTS PLACED   Chronic kidney disease    Collagen vascular disease (HCC)    Hyperlipidemia    Hypertension    Pre-diabetes    Prediabetes     Past Surgical History:  Procedure Laterality Date   ANGIOPLASTY     CARDIAC CATHETERIZATION     INCISIONAL HERNIA REPAIR N/A 05/02/2022   Procedure: LAPAROSCOPIC INCISIONAL HERNIA REPAIR WITH MESH;  Surgeon: Vernetta Berg, MD;  Location: [facility];  Service: General;  Laterality: N/A;   ROBOTIC ASSITED PARTIAL NEPHRECTOMY Left 12/05/2021   Procedure: XI ROBOTIC ASSITED PARTIAL NEPHRECTOMY;  Surgeon: Cam Morene ORN, MD;  Location: [facility];  Service: Urology;  Laterality: Left;  3.5 HRS    Family History  Problem Relation Age of Onset   Heart Problems Mother    Stroke Father    Alzheimer's disease Father    High blood pressure Father    Colon cancer Neg Hx    Colon polyps Neg Hx    Esophageal cancer Neg Hx    Rectal cancer Neg Hx    Stomach cancer Neg Hx     Social History Reviewed with no changes to be made today.   Facility-Administered Medications Prior to Visit  Medication Dose Route Frequency Provider Last Rate Last Admin   0.9 %  sodium chloride  infusion  500 mL Intravenous Continuous Armbruster, Elspeth SQUIBB, MD       Outpatient Medications Prior to Visit  Medication Sig Dispense Refill   albuterol  (VENTOLIN  HFA) 108 (90 Base) MCG/ACT  inhaler Inhale 1-2 puffs into the lungs every 6 (six) hours as needed. 18 g 1   aspirin  EC 81 MG tablet Take 81 mg by mouth at bedtime. Swallow whole.     atorvastatin  (LIPITOR) 40 MG tablet Take 1 tablet (40 mg total) by mouth at bedtime. 90 tablet 3   Blood Glucose Calibration (ACCU-CHEK GUIDE CONTROL) LIQD 1 each by In Vitro route once as needed for up to 1 dose. R73.03 1 each 0   carvedilol  (COREG ) 3.125 MG tablet Take 1 tablet (3.125 mg total) by mouth 2 (two) times daily with a meal. Please schedule appt with Haze Servant. 60 tablet 0   Cholecalciferol  (VITAMIN D3) 125 MCG (5000 UT) CAPS  Take 5,000 Units by mouth daily. When compliant     fluticasone  (FLONASE ) 50 MCG/ACT nasal spray Place 2 sprays into both nostrils daily. 16 g 1   glucose blood (ACCU-CHEK GUIDE) test strip Use as instructed. Check blood glucose by fingerstick once per day. R73.03 100 each 0   icosapent  Ethyl (VASCEPA ) 1 g capsule Take 1 capsule (1 g total) by mouth 2 (two) times daily. Please schedule appt with Cleon Signorelli. 60 capsule 0   Multiple Vitamins-Minerals (PRESERVISION AREDS PO) Take by mouth.     senna-docusate (SENOKOT-S) 8.6-50 MG tablet Take 1 tablet by mouth daily. For constipation 100 tablet 1   tamsulosin  (FLOMAX ) 0.4 MG CAPS capsule Take 2 capsules (0.8 mg total) by mouth daily. Please schedule appt with Christian Treadway. 60 capsule 0   Omega-3 Fatty Acids  (OMEGA 3 FISH OIL PO) Take by mouth in the morning and at bedtime.     ciclopirox  (PENLAC ) 8 % solution Apply topically at bedtime. Apply over nail and surrounding skin. Apply daily over previous coat. After seven (7) days, may remove with alcohol and continue cycle. (Patient not taking: Reported on 03/30/2024) 6 mL 1   clopidogrel  (PLAVIX ) 75 MG tablet Take 75 mg by mouth once. (Patient not taking: Reported on 03/30/2024)     diclofenac  Sodium (VOLTAREN ) 1 % GEL Apply 2 g topically 4 (four) times daily. For joint pain (Patient not taking: Reported on 03/30/2024) 200 g 0   Multiple Vitamins-Minerals (MULTIVITAMIN WITH MINERALS) tablet Take 1 tablet by mouth daily.     mupirocin  ointment (BACTROBAN ) 2 % Apply 1 Application topically 2 (two) times daily. Apply to right cheek (Patient not taking: Reported on 03/30/2024) 44 g 1   ondansetron  (ZOFRAN -ODT) 8 MG disintegrating tablet Take 8 mg by mouth every 8 (eight) hours as needed. (Patient not taking: Reported on 03/30/2024)     buPROPion  (WELLBUTRIN  SR) 150 MG 12 hr tablet Take 1 tablet (150 mg total) by mouth 2 (two) times daily. (Patient not taking: Reported on 03/30/2024) 60 tablet 1    No Known  Allergies     Objective:    BP 112/63 (BP Location: Left Arm, Patient Position: Sitting, Cuff Size: Normal)   Pulse (!) 43   Resp 19   Ht 5' 5 (1.651 m)   Wt 182 lb 12.8 oz (82.9 kg)   SpO2 100%   BMI 30.42 kg/m  Wt Readings from Last 3 Encounters:  03/30/24 180 lb (81.6 kg)  03/30/24 182 lb 12.8 oz (82.9 kg)  07/04/23 194 lb 3.2 oz (88.1 kg)    Physical Exam Vitals and nursing note reviewed.  Constitutional:      Appearance: He is well-developed.  HENT:     Head: Normocephalic and atraumatic.  Cardiovascular:  Rate and Rhythm: Regular rhythm. Bradycardia present.     Heart sounds: Normal heart sounds. No murmur heard.    No friction rub. No gallop.  Pulmonary:     Effort: Pulmonary effort is normal. No tachypnea or respiratory distress.     Breath sounds: Normal breath sounds. No decreased breath sounds, wheezing, rhonchi or rales.  Chest:     Chest wall: No tenderness.  Musculoskeletal:        General: Normal range of motion.     Cervical back: Normal range of motion.  Skin:    General: Skin is warm and dry.  Neurological:     Mental Status: He is alert and oriented to person, place, and time.     Coordination: Coordination normal.  Psychiatric:        Behavior: Behavior normal. Behavior is cooperative.        Thought Content: Thought content normal.        Judgment: Judgment normal.          Patient has been counseled extensively about nutrition and exercise as well as the importance of adherence with medications and regular follow-up. The patient was given clear instructions to go to ER or return to medical center if symptoms don't improve, worsen or new problems develop. The patient verbalized understanding.   Follow-up: No follow-ups on file.   Haze LELON Servant, FNP-BC [facility] and [facility] [place], [place] [phone]   03/30/2024, 1:34 PM

## 2024-03-30 NOTE — ED Notes (Signed)
 Awaiting verification for 1345 meds

## 2024-03-30 NOTE — ED Notes (Signed)
 CCMD called.

## 2024-03-30 NOTE — ED Triage Notes (Signed)
 PT sent from PCP due to bradycardia with HR in the 30s.

## 2024-03-30 NOTE — ED Notes (Signed)
 XR at bedside

## 2024-03-31 ENCOUNTER — Inpatient Hospital Stay (HOSPITAL_BASED_OUTPATIENT_CLINIC_OR_DEPARTMENT_OTHER)
Admit: 2024-03-31 | Discharge: 2024-03-31 | Disposition: A | Discharge status: Home / Self Care | Attending: Cardiology | Admitting: Cardiology

## 2024-03-31 ENCOUNTER — Observation Stay (HOSPITAL_COMMUNITY)

## 2024-03-31 ENCOUNTER — Other Ambulatory Visit: Payer: Self-pay

## 2024-03-31 ENCOUNTER — Other Ambulatory Visit: Payer: Self-pay | Admitting: Cardiology

## 2024-03-31 DIAGNOSIS — I442 Atrioventricular block, complete: Secondary | ICD-10-CM | POA: Diagnosis not present

## 2024-03-31 DIAGNOSIS — R001 Bradycardia, unspecified: Secondary | ICD-10-CM | POA: Diagnosis not present

## 2024-03-31 DIAGNOSIS — R55 Syncope and collapse: Secondary | ICD-10-CM

## 2024-03-31 DIAGNOSIS — E7800 Pure hypercholesterolemia, unspecified: Secondary | ICD-10-CM

## 2024-03-31 DIAGNOSIS — I2510 Atherosclerotic heart disease of native coronary artery without angina pectoris: Secondary | ICD-10-CM | POA: Diagnosis not present

## 2024-03-31 LAB — BASIC METABOLIC PANEL WITH GFR
Anion gap: 8 (ref 5–15)
BUN: 12 mg/dL (ref 8–23)
CO2: 23 mmol/L (ref 22–32)
Calcium: 8.3 mg/dL — ABNORMAL LOW (ref 8.9–10.3)
Chloride: 110 mmol/L (ref 98–111)
Creatinine, Ser: 1.03 mg/dL (ref 0.61–1.24)
GFR, Estimated: >60 mL/min (ref >60)
Glucose, Bld: 99 mg/dL (ref 70–99)
Potassium: 3.6 mmol/L (ref 3.5–5.1)
Sodium: 141 mmol/L (ref 135–145)

## 2024-03-31 LAB — ECHOCARDIOGRAM COMPLETE
AR max vel: 3.66 cm2
AV Peak grad: 11.3 mmHg
Ao pk vel: 1.68 m/s
Area-P 1/2: 2.61 cm2
Height: 68 in
S' Lateral: 3.97 cm
Weight: 2880 [oz_av]

## 2024-03-31 LAB — CBC
HCT: 43 % (ref 39.0–52.0)
Hemoglobin: 14.3 g/dL (ref 13.0–17.0)
MCH: 32 pg (ref 26.0–34.0)
MCHC: 33.3 g/dL (ref 30.0–36.0)
MCV: 96.2 fL (ref 80.0–100.0)
Platelets: 101 K/uL — ABNORMAL LOW (ref 150–400)
RBC: 4.47 MIL/uL (ref 4.22–5.81)
RDW: 12.7 % (ref 11.5–15.5)
WBC: 6 K/uL (ref 4.0–10.5)
nRBC: 0 % (ref 0.0–0.2)

## 2024-03-31 LAB — GLUCOSE, CAPILLARY
Glucose-Capillary: 106 mg/dL — ABNORMAL HIGH (ref 70–99)
Glucose-Capillary: 114 mg/dL — ABNORMAL HIGH (ref 70–99)

## 2024-03-31 LAB — MRSA NEXT GEN BY PCR, NASAL: MRSA by PCR Next Gen: NOT DETECTED

## 2024-03-31 MED ORDER — PERFLUTREN LIPID MICROSPHERE
1.0000 mL | INTRAVENOUS | Status: DC | PRN
  Administered 2024-03-31: 2 mL via INTRAVENOUS

## 2024-03-31 MED ORDER — LOSARTAN POTASSIUM 25 MG PO TABS
25.0000 mg | ORAL_TABLET | Freq: Every day | ORAL | 2 refills | Status: DC
  Filled 2024-03-31: qty 30, 30d supply, fill #0
  Filled 2024-04-26: qty 30, 30d supply, fill #1

## 2024-03-31 NOTE — Progress Notes (Signed)
 Echocardiogram 2D Echocardiogram has been performed.  Samuel Lawson 03/31/2024, 11:50 AM

## 2024-03-31 NOTE — Discharge Summary (Addendum)
 Physician Discharge Summary  Caroline Matters FMW:[id number] DOB: 1946-02-10 DOA: 03/30/2024  PCP: Theotis Haze ORN, NP  Admit date: 03/30/2024 Discharge date: 03/31/2024    Admitted From: Home Disposition: Home  Recommendations for Outpatient Follow-up:  Follow up with PCP in 1-2 weeks Please obtain BMP/CBC in one week Follow-up with cardiology per the recommended time and date Please follow up with your PCP on the following pending results: Unresulted Labs (From admission, onward)     Start     Ordered   04/06/24 0500  Creatinine, serum  (enoxaparin  (LOVENOX )    CrCl >/= 30 ml/min)  Weekly,   R     Comments: while on enoxaparin  therapy    03/30/24 1340              Home Health: None Equipment/Devices: None  Discharge Condition: Stable CODE STATUS: Full code Diet recommendation: Cardiac  Subjective: Seen and examined this morning.  Other than just concerned about having intermittent bradycardia, he had no other complaint, no further dizziness that he had yesterday.  Denied any chest pain, palpitation or shortness of breath.  Brief/Interim Summary:  Samuel Lawson is a 78 y.o. male with medical history significant of hypertension, depression, diabetes and CAD went to see his PCP for routine workup.  He was found to have sinus bradycardia, endorsed having intermittent dizziness for quite some time so he was directed to ED. Upon arrival to ED, patient was bradycardic.SABRA  Blood pressure fairly stable.  Otherwise hemodynamically stable.  EKG showed possible high-grade heart block.  Cardiology consulted, patient's Coreg  was held, he was observed overnight.  As time went on, his Coreg  was washed out of the body, he then started having intermittent Mobitz type I heart block instead of complete heart block.  Patient also became asymptomatic.  Cardiology consulted EP.  They did not think that pacemaker was indicated.  They placed Zio patch on him and cleared him for discharge.   Discontinuing Coreg  at discharge and cardiology will follow-up with him as outpatient.  Patient's Plavix  was stopped by cardiology and he was started on losartan .  Resuming rest of the medications.  Of note, when I saw him this morning after cardiology's visit, patient's heart rate briefly went down to 40s.  Patient was concerned about that.  I reached out to EP and requested them to come talk to patient again and assess him again.  They did and they informed me that they are not concerned and cleared the patient for discharge again.  They also educated the patient as well.  Discharge plan was discussed with patient and/or family member and they verbalized understanding and agreed with it.  Discharge Diagnoses:  Principal Problem:   Symptomatic bradycardia Active Problems:   Heart block AV complete (HCC)   Near syncope    Discharge Instructions   Allergies as of 03/31/2024   No Known Allergies      Medication List     STOP taking these medications    carvedilol  3.125 MG tablet Commonly known as: COREG    ciclopirox  8 % solution Commonly known as: PENLAC    clopidogrel  75 MG tablet Commonly known as: PLAVIX    diclofenac  Sodium 1 % Gel Commonly known as: VOLTAREN    fluticasone  50 MCG/ACT nasal spray Commonly known as: FLONASE    mupirocin  ointment 2 % Commonly known as: BACTROBAN        TAKE these medications    Accu-Chek Guide Control Liqd 1 each by In Vitro route once as needed for up to  1 dose. R73.03   Accu-Chek Guide test strip Generic drug: glucose blood Use as instructed. Check blood glucose by fingerstick once per day. R73.03   albuterol  108 (90 Base) MCG/ACT inhaler Commonly known as: VENTOLIN  HFA Inhale 1-2 puffs into the lungs every 6 (six) hours as needed.   aspirin  EC 81 MG tablet Take 81 mg by mouth at bedtime. Swallow whole.   atorvastatin  40 MG tablet Commonly known as: LIPITOR Take 1 tablet (40 mg total) by mouth at bedtime.   buPROPion   150 MG 12 hr tablet Commonly known as: Wellbutrin  SR Take 1 tablet (150 mg total) by mouth 2 (two) times daily.   losartan  25 MG tablet Commonly known as: Cozaar  Take 1 tablet (25 mg total) by mouth daily.   multivitamin with minerals tablet Take 1 tablet by mouth daily.   tamsulosin  0.4 MG Caps capsule Commonly known as: FLOMAX  Take 2 capsules (0.8 mg total) by mouth daily. Please schedule appt with Zelda Fleming. What changed:  how much to take when to take this   Vascepa  1 g capsule Generic drug: icosapent  Ethyl Take 1 capsule (1 g total) by mouth 2 (two) times daily. Please schedule appt with Zelda Fleming.   Vitamin D3 125 MCG (5000 UT) Caps Take 5,000 Units by mouth daily. When compliant         Follow-up Information     Theotis Haze ORN, NP Follow up in 1 week(s).   Specialty: Nurse Practitioner Contact information: [address] [place] [address] [phone]                No Known Allergies  Consultations: Cardiology   Procedures/Studies: DG Chest Portable 1 View Result Date: 03/30/2024 CLINICAL DATA:  Weakness. EXAM: PORTABLE CHEST 1 VIEW COMPARISON:  March 17, 2024. FINDINGS: The heart size and mediastinal contours are within normal limits. Both lungs are clear. The visualized skeletal structures are unremarkable. IMPRESSION: No active disease. Electronically Signed   By: Lynwood Landy Raddle M.D.   On: 03/30/2024 13:38   DG Chest 2 View Result Date: 03/18/2024 CLINICAL DATA:  Malignant neoplasm of left kidney. EXAM: CHEST - 2 VIEW COMPARISON:  Chest radiograph 09/13/2023 FINDINGS: The cardiomediastinal contours are normal. Chronic eventration of right hemidiaphragm. Mild diffuse bronchial thickening. Pulmonary vasculature is normal. No consolidation, pleural effusion, or pneumothorax. No evidence of pulmonary nodule. Thoracic spondylosis. No acute osseous abnormalities are seen. IMPRESSION: Mild diffuse bronchial thickening. Electronically Signed    By: Andrea Gasman M.D.   On: 03/18/2024 18:38     Discharge Exam: Vitals:   03/31/24 0922 03/31/24 1200  BP: 127/77 (!) 124/59  Pulse:  (!) 54  Resp:  20  Temp:  98.5 F (36.9 C)  SpO2:  95%   Vitals:   03/31/24 0331 03/31/24 0732 03/31/24 0922 03/31/24 1200  BP: 117/62 (!) 98/43 127/77 (!) 124/59  Pulse: (!) 51 (!) 50  (!) 54  Resp: (!) 21 20  20   Temp: 97.9 F (36.6 C) 98.2 F (36.8 C)  98.5 F (36.9 C)  TempSrc: Oral Oral  Oral  SpO2: 96% 94%  95%  Weight:      Height:        General: Pt is alert, awake, not in acute distress Cardiovascular: RRR, S1/S2 +, no rubs, no gallops Respiratory: CTA bilaterally, no wheezing, no rhonchi Abdominal: Soft, NT, ND, bowel sounds + Extremities: no edema, no cyanosis    The results of significant diagnostics from this hospitalization (including imaging,  microbiology, ancillary and laboratory) are listed below for reference.     Microbiology: Recent Results (from the past 240 hours)  MRSA Next Gen by PCR, Nasal     Status: None   Collection Time: 03/31/24  5:14 AM   Specimen: Nasal Mucosa; Nasal Swab  Result Value Ref Range Status   MRSA by PCR Next Gen NOT DETECTED NOT DETECTED Final    Comment: (NOTE) The GeneXpert MRSA Assay (FDA approved for NASAL specimens only), is one component of a comprehensive MRSA colonization surveillance program. It is not intended to diagnose MRSA infection nor to guide or monitor treatment for MRSA infections. Test performance is not FDA approved in patients less than 59 years old. Performed at [facility], [address] [place], [place] [address]      Labs: BNP (last 3 results) No results for input(s): BNP in the last 8760 hours. Basic Metabolic Panel: Recent Labs  Lab 03/30/24 1221 03/31/24 0241  NA 140 141  K 4.4 3.6  CL 106 110  CO2 23 23  GLUCOSE 114* 99  BUN 14 12  CREATININE 1.51* 1.03  CALCIUM  9.1 8.3*  MG 2.5*  --    Liver Function Tests: Recent  Labs  Lab 03/30/24 1221  AST 22  ALT 11  ALKPHOS 65  BILITOT 1.6*  PROT 7.7  ALBUMIN 4.4   No results for input(s): LIPASE, AMYLASE in the last 168 hours. No results for input(s): AMMONIA in the last 168 hours. CBC: Recent Labs  Lab 03/30/24 1221 03/31/24 0241  WBC 6.1 6.0  NEUTROABS 4.2  --   HGB 16.9 14.3  HCT 51.0 43.0  MCV 96.4 96.2  PLT 119* 101*   Cardiac Enzymes: No results for input(s): CKTOTAL, CKMB, CKMBINDEX, TROPONINI in the last 168 hours. BNP: Invalid input(s): POCBNP CBG: Recent Labs  Lab 03/30/24 1420 03/30/24 1529 03/30/24 1952 03/31/24 0606 03/31/24 1158  GLUCAP 102* 89 157* 106* 114*   D-Dimer No results for input(s): DDIMER in the last 72 hours. Hgb A1c No results for input(s): HGBA1C in the last 72 hours. Lipid Profile Recent Labs    03/30/24 1221  CHOL 120  HDL 62  LDLCALC 44  TRIG 72  CHOLHDL 1.9   Thyroid  function studies Recent Labs    03/30/24 1400  TSH 2.874   Anemia work up No results for input(s): VITAMINB12, FOLATE, FERRITIN, TIBC, IRON, RETICCTPCT in the last 72 hours. Urinalysis    Component Value Date/Time   COLORURINE YELLOW 04/20/2022 2248   APPEARANCEUR CLEAR 04/20/2022 2248   LABSPEC 1.006 04/20/2022 2248   PHURINE 5.0 04/20/2022 2248   GLUCOSEU NEGATIVE 04/20/2022 2248   HGBUR NEGATIVE 04/20/2022 2248   BILIRUBINUR NEGATIVE 04/20/2022 2248   BILIRUBINUR neg 07/19/2015 1014   KETONESUR NEGATIVE 04/20/2022 2248   PROTEINUR NEGATIVE 04/20/2022 2248   UROBILINOGEN 0.2 07/19/2015 1014   UROBILINOGEN 0.2 08/11/2013 1715   NITRITE NEGATIVE 04/20/2022 2248   LEUKOCYTESUR NEGATIVE 04/20/2022 2248   Sepsis Labs Recent Labs  Lab 03/30/24 1221 03/31/24 0241  WBC 6.1 6.0   Microbiology Recent Results (from the past 240 hours)  MRSA Next Gen by PCR, Nasal     Status: None   Collection Time: 03/31/24  5:14 AM   Specimen: Nasal Mucosa; Nasal Swab  Result Value Ref Range  Status   MRSA by PCR Next Gen NOT DETECTED NOT DETECTED Final    Comment: (NOTE) The GeneXpert MRSA Assay (FDA approved for NASAL specimens only), is one  component of a comprehensive MRSA colonization surveillance program. It is not intended to diagnose MRSA infection nor to guide or monitor treatment for MRSA infections. Test performance is not FDA approved in patients less than 63 years old. Performed at [facility], [address] [place], [place] [address]     FURTHER DISCHARGE INSTRUCTIONS:   Get Medicines reviewed and adjusted: Please take all your medications with you for your next visit with your Primary MD   Laboratory/radiological data: Please request your Primary MD to go over all hospital tests and procedure/radiological results at the follow up, please ask your Primary MD to get all Hospital records sent to his/her office.   In some cases, they will be blood work, cultures and biopsy results pending at the time of your discharge. Please request that your primary care M.D. goes through all the records of your hospital data and follows up on these results.   Also Note the following: If you experience worsening of your admission symptoms, develop shortness of breath, life threatening emergency, suicidal or homicidal thoughts you must seek medical attention immediately by calling 911 or calling your MD immediately  if symptoms less severe.   You must read complete instructions/literature along with all the possible adverse reactions/side effects for all the Medicines you take and that have been prescribed to you. Take any new Medicines after you have completely understood and accpet all the possible adverse reactions/side effects.    patient was instructed, not to drive, operate heavy machinery, perform activities at heights, swimming or participation in water  activities or provide baby-sitting services while on Pain, Sleep and Anxiety Medications; until their outpatient  Physician has advised to do so again. Also recommended to not to take more than prescribed Pain, Sleep and Anxiety Medications.  It is not advisable to combine anxiety, sleep and pain medications without talking with your primary care provider.     Wear Seat belts while driving.   Please note: You were cared for by a hospitalist during your hospital stay. Once you are discharged, your primary care physician will handle any further medical issues. Please note that NO REFILLS for any discharge medications will be authorized once you are discharged, as it is imperative that you return to your primary care physician (or establish a relationship with a primary care physician if you do not have one) for your post hospital discharge needs so that they can reassess your need for medications and monitor your lab values  Time coordinating discharge: Over 30 minutes  SIGNED:   Fredia Skeeter, MD  [facility] 03/31/2024, 12:41 PM *Please note that this is a verbal dictation therefore any spelling or grammatical errors are due to the Dragon Medical One system interpretation. If 7PM-7AM, please contact night-coverage [email or web address]

## 2024-03-31 NOTE — TOC Transition Note (Signed)
 Transition of Care [facility]) - Discharge Note   Patient Details  Name: Samuel Lawson MRN: [id number] Date of Birth: 1946-04-17  Transition of Care [facility]) CM/SW Contact:  Samuel KANDICE Stain, RN Phone Number: 03/31/2024, 12:01 PM   Clinical Narrative:    Kwamane Whack is stable to discharge home. Patient to follow up with PCP.  No TOC needs at this time.   Final next level of care: Home/Self Care Barriers to Discharge: Barriers Resolved   Patient Goals and CMS Choice Patient states their goals for this hospitalization and ongoing recovery are:: return home          Discharge Placement             Home          Discharge Plan and Services Additional resources added to the After Visit Summary for                                       Social Drivers of Health (SDOH) Interventions SDOH Screenings   Food Insecurity: No Food Insecurity (03/30/2024)  Housing: Low Risk  (03/30/2024)  Transportation Needs: No Transportation Needs (03/30/2024)  Utilities: Not At Risk (03/30/2024)  Alcohol Screen: Low Risk  (04/22/2023)  Depression (PHQ2-9): Medium Risk (03/30/2024)  Financial Resource Strain: Low Risk  (04/22/2023)  Physical Activity: Insufficiently Active (04/22/2023)  Social Connections: Moderately Isolated (03/30/2024)  Stress: No Stress Concern Present (07/04/2023)  Tobacco Use: High Risk (03/30/2024)  Health Literacy: Adequate Health Literacy (04/22/2023)     Readmission Risk Interventions    05/06/2022   10:25 AM  Readmission Risk Prevention Plan  Post Dischage Appt Complete  Medication Screening Complete  Transportation Screening Complete

## 2024-03-31 NOTE — Progress Notes (Signed)
 Patient Name: Samuel Lawson Date of Encounter: 03/31/2024 [facility] Cardiologist: Shelda Bruckner, MD  03/30/2024 .admit Length of stay: 0  Interval Summary  .    Samuel Lawson is a 78 y.o. male with a hx of coronary artery disease with 2 stents placed in 2001, hypertension, hyperlipidemia, and tobacco use who is being seen 03/30/2024 for the evaluation of bradycardia complete heart block.  He had not seen his primary cardiologist in several years.  He remains asymptomatic without chest pain or dyspnea but presented with near syncopal spells ongoing for the past 6 months.  His beta-blocker was held yesterday.  Physical Exam    Vitals:   03/31/24 0331 03/31/24 0732 03/31/24 0922 03/31/24 1200  BP: 117/62 (!) 98/43 127/77 (!) 124/59  Pulse: (!) 51 (!) 50  (!) 54  Resp: (!) 21 20  20   Temp: 97.9 F (36.6 C) 98.2 F (36.8 C)  98.5 F (36.9 C)  TempSrc: Oral Oral  Oral  SpO2: 96% 94%  95%  Weight:      Height:       Physical Exam Neck:     Vascular: No carotid bruit or JVD.  Cardiovascular:     Rate and Rhythm: Normal rate and regular rhythm.     Pulses: Intact distal pulses.     Heart sounds: Normal heart sounds. No murmur heard.    No gallop.  Pulmonary:     Effort: Pulmonary effort is normal.     Breath sounds: Normal breath sounds.  Abdominal:     General: Bowel sounds are normal.     Palpations: Abdomen is soft.  Musculoskeletal:     Right lower leg: No edema.     Left lower leg: No edema.        03/30/2024   12:04 PM 03/30/2024   10:09 AM 07/04/2023    9:47 AM  Last 3 Weights  Weight (lbs) 180 lb 182 lb 12.8 oz 194 lb 3.2 oz  Weight (kg) 81.647 kg 82.918 kg 88.089 kg      Labs   Lab Results  Component Value Date   NA 141 03/31/2024   K 3.6 03/31/2024   CO2 23 03/31/2024   GLUCOSE 99 03/31/2024   BUN 12 03/31/2024   CREATININE 1.03 03/31/2024   CALCIUM  8.3 (L) 03/31/2024   EGFR 65.3 03/18/2024   GFRNONAA >60 03/31/2024        Latest Ref Rng & Units 03/31/2024    2:41 AM 03/30/2024   12:21 PM 07/04/2023   10:22 AM  BMP  Glucose 70 - 99 mg/dL 99  885  887   BUN 8 - 23 mg/dL 12  14  16    Creatinine 0.61 - 1.24 mg/dL 8.96  8.48  8.76   BUN/Creat Ratio 10 - 24   13   Sodium 135 - 145 mmol/L 141  140  140   Potassium 3.5 - 5.1 mmol/L 3.6  4.4  4.4   Chloride 98 - 111 mmol/L 110  106  104   CO2 22 - 32 mmol/L 23  23  24    Calcium  8.9 - 10.3 mg/dL 8.3  9.1  9.0        Latest Ref Rng & Units 03/31/2024    2:41 AM 03/30/2024   12:21 PM 01/29/2023   10:30 AM  CBC  WBC 4.0 - 10.5 K/uL 6.0  6.1  7.1   Hemoglobin 13.0 - 17.0 g/dL 85.6  83.0  84.6   Hematocrit  39.0 - 52.0 % 43.0  51.0  45.9   Platelets 150 - 400 K/uL 101  119  134     Lab Results  Component Value Date   CHOL 120 03/30/2024   HDL 62 03/30/2024   LDLCALC 44 03/30/2024   TRIG 72 03/30/2024   CHOLHDL 1.9 03/30/2024    Lab Results  Component Value Date   TSH 2.874 03/30/2024    Lab Results  Component Value Date   HGBA1C 5.9 (H) 07/04/2023   Tele/EKG/Cardiac studies    Telemetry: Mobitz type I AV block.  No further complete heart block or high degree AV block.  EKG:  Admission EKG 03/30/2024: Complete heart block with ventricular escape at 40 bpm.  ECHOCARDIOGRAM COMPLETE 03/31/24 Preliminary results: Normal LV systolic function, no significant valvular abnormality.  Radiology   DG Chest Portable 1 View Result Date: 03/30/2024 CLINICAL DATA:  Weakness. EXAM: PORTABLE CHEST 1 VIEW COMPARISON:  March 17, 2024. FINDINGS: The heart size and mediastinal contours are within normal limits. Both lungs are clear. The visualized skeletal structures are unremarkable. IMPRESSION: No active disease. Electronically Signed   By: Lynwood Landy Raddle M.D.   On: 03/30/2024 13:38     Current Meds:     Current Facility-Administered Medications:    acetaminophen  (TYLENOL ) tablet 650 mg, 650 mg, Oral, Q6H PRN **OR** acetaminophen  (TYLENOL )  suppository 650 mg, 650 mg, Rectal, Q6H PRN, Vernon Ranks, MD   albuterol  (PROVENTIL ) (2.5 MG/3ML) 0.083% nebulizer solution 2.5 mg, 2.5 mg, Inhalation, Q6H PRN, Vernon Ranks, MD   alum & mag hydroxide-simeth (MAALOX/MYLANTA) 200-200-20 MG/5ML suspension 30 mL, 30 mL, Oral, Q6H PRN, Vernon Ranks, MD, 30 mL at 03/30/24 2049   aspirin  EC tablet 81 mg, 81 mg, Oral, QHS, Pahwani, Ranks, MD, 81 mg at 03/30/24 2224   atorvastatin  (LIPITOR) tablet 40 mg, 40 mg, Oral, QHS, Pahwani, Ravi, MD, 40 mg at 03/30/24 2224   buPROPion  (WELLBUTRIN  SR) 12 hr tablet 150 mg, 150 mg, Oral, BID, Pahwani, Ravi, MD, 150 mg at 03/31/24 1133   enoxaparin  (LOVENOX ) injection 40 mg, 40 mg, Subcutaneous, Q24H, Pahwani, Ravi, MD, 40 mg at 03/30/24 1417   fluticasone  (FLONASE ) 50 MCG/ACT nasal spray 2 spray, 2 spray, Each Nare, Daily PRN, Vernon Ranks, MD   insulin  aspart (novoLOG ) injection 0-9 Units, 0-9 Units, Subcutaneous, TID WC, Sundil, Subrina, MD   ondansetron  (ZOFRAN ) tablet 4 mg, 4 mg, Oral, Q6H PRN **OR** ondansetron  (ZOFRAN ) injection 4 mg, 4 mg, Intravenous, Q6H PRN, Pahwani, Ravi, MD   tamsulosin  (FLOMAX ) capsule 0.8 mg, 0.8 mg, Oral, Daily, Pahwani, Ravi, MD, 0.8 mg at 03/31/24 1133  Current Outpatient Medications:    albuterol  (VENTOLIN  HFA) 108 (90 Base) MCG/ACT inhaler, Inhale 1-2 puffs into the lungs every 6 (six) hours as needed., Disp: 18 g, Rfl: 1   aspirin  EC 81 MG tablet, Take 81 mg by mouth at bedtime. Swallow whole., Disp: , Rfl:    atorvastatin  (LIPITOR) 40 MG tablet, Take 1 tablet (40 mg total) by mouth at bedtime., Disp: 90 tablet, Rfl: 3   buPROPion  (WELLBUTRIN  SR) 150 MG 12 hr tablet, Take 1 tablet (150 mg total) by mouth 2 (two) times daily., Disp: 180 tablet, Rfl: 3   Cholecalciferol  (VITAMIN D3) 125 MCG (5000 UT) CAPS, Take 5,000 Units by mouth daily. When compliant, Disp: , Rfl:    icosapent  Ethyl (VASCEPA ) 1 g capsule, Take 1 capsule (1 g total) by mouth 2 (two) times daily. Please schedule  appt with Zelda Fleming., Disp: 60  capsule, Rfl: 0   losartan  (COZAAR ) 25 MG tablet, Take 1 tablet (25 mg total) by mouth daily., Disp: 30 tablet, Rfl: 2   Multiple Vitamins-Minerals (MULTIVITAMIN WITH MINERALS) tablet, Take 1 tablet by mouth daily., Disp: , Rfl:    tamsulosin  (FLOMAX ) 0.4 MG CAPS capsule, Take 2 capsules (0.8 mg total) by mouth daily. Please schedule appt with Zelda Fleming. (Patient taking differently: Take 0.4 mg by mouth 2 (two) times daily. Please schedule appt with [facility].), Disp: 60 capsule, Rfl: 0   Blood Glucose Calibration (ACCU-CHEK GUIDE CONTROL) LIQD, 1 each by In Vitro route once as needed for up to 1 dose. R73.03, Disp: 1 each, Rfl: 0   glucose blood (ACCU-CHEK GUIDE) test strip, Use as instructed. Check blood glucose by fingerstick once per day. R73.03, Disp: 100 each, Rfl: 0  Assessment & Plan .     1.  Transient complete heart block 2.  Mobitz 1 AV block 3.  Coronary artery disease of native vessel without angina pectoris with history of coronary stents in 2001. 4.  Hypercholesterolemia  Recommendations: Evaluated by EP, no indication for pacemaker implantation.  Will hold off on the Coreg  and start him on losartan  for cardiovascular protection and discharge him home with Zio patch monitoring For 2 weeks.  Presently has underlying Mobitz 1 AV block only, overnight telemetry reviewed personally.  From coronary artery disease standpoint he remains stable without recurrence of angina pectoris.  For lipids he is on Lipitor 40 mg daily, LDL is at goal.  Continue the same.  I will see him back in the office and if there is no further high degree AV block, we will see him back on a as needed basis as he has remained stable from cardiovascular standpoint and is appropriately being managed for chronic coronary artery disease.  For questions or updates, please contact [facility] Please consult [email or web address] for contact info under         Signed,   Gordy Bergamo, MD, [facility] 03/31/2024, 3:05 PM [facility] [address] [place], [place] [address] Phone: [phone]. Fax:  [phone]

## 2024-03-31 NOTE — TOC CM/SW Note (Signed)
 Transition of Care [facility]) - Inpatient Brief Assessment   Patient Details  Name: Samuel Lawson MRN: [id number] Date of Birth: 1946-02-05  Transition of Care [facility]) CM/SW Contact:    Lauraine FORBES Saa, LCSW Phone Number: 03/31/2024, 9:03 AM   Clinical Narrative:  9:03 AM Per chart review, patient resides at home alone. Patient has a PCP and insurance. Patient does not have SNF or HH history. Patient was ordered a rolling walker in August 2023. Patient's preferred pharmacy's are [facility] and [facility]. No TOC needs were identified at this time. TOC will continue to follow and be available to assist.  Transition of Care Asessment: Insurance and Status: Insurance coverage has been reviewed Patient has primary care physician: Yes Home environment has been reviewed: Private Residence Prior level of function:: N/A Prior/Current Home Services: No current home services Social Drivers of Health Review: SDOH reviewed no interventions necessary Readmission risk has been reviewed: Yes Transition of care needs: no transition of care needs at this time

## 2024-03-31 NOTE — Progress Notes (Signed)
 Hospital applied ZIO AT order was entered incorrectly as a Home enrollment.  Order has been corrected and [employer] has been notified of the error and asked to please associate serial # [id number] with the patients original enrollment.

## 2024-03-31 NOTE — Plan of Care (Signed)
  Problem: Coping: Goal: Ability to adjust to condition or change in health will improve Outcome: Progressing   Problem: Fluid Volume: Goal: Ability to maintain a balanced intake and output will improve Outcome: Progressing   Problem: Health Behavior/Discharge Planning: Goal: Ability to identify and utilize available resources and services will improve Outcome: Progressing   Problem: Skin Integrity: Goal: Risk for impaired skin integrity will decrease Outcome: Progressing   Problem: Education: Goal: Knowledge of General Education information will improve Description: Including pain rating scale, medication(s)/side effects and non-pharmacologic comfort measures Outcome: Progressing   Problem: Nutrition: Goal: Adequate nutrition will be maintained Outcome: Progressing   Problem: Pain Managment: Goal: General experience of comfort will improve and/or be controlled Outcome: Progressing

## 2024-03-31 NOTE — Progress Notes (Signed)
 14d zio ordered per Dr. Ladona

## 2024-03-31 NOTE — Progress Notes (Signed)
Patient provided with verbal discharge instructions. Paper copy of discharge provided to patient. RN answered all questions. VSS at discharge. IV's removed. Patient belongings sent with patient. Patient dc'd via wheelchair to private vehicle.

## 2024-04-01 ENCOUNTER — Telehealth: Payer: Self-pay | Admitting: Nurse Practitioner

## 2024-04-01 ENCOUNTER — Telehealth: Payer: Self-pay

## 2024-04-01 ENCOUNTER — Other Ambulatory Visit: Payer: Self-pay | Admitting: Nurse Practitioner

## 2024-04-01 DIAGNOSIS — R55 Syncope and collapse: Secondary | ICD-10-CM | POA: Diagnosis not present

## 2024-04-01 DIAGNOSIS — E785 Hyperlipidemia, unspecified: Secondary | ICD-10-CM

## 2024-04-01 DIAGNOSIS — I2510 Atherosclerotic heart disease of native coronary artery without angina pectoris: Secondary | ICD-10-CM

## 2024-04-01 NOTE — Telephone Encounter (Signed)
 Copied from CRM [id number]. Topic: Appointments - Scheduling Inquiry for Clinic >> Apr 01, 2024 10:50 AM Ivette P wrote:  Reason for CRM: Pt called in to schedule a Hospital Follow up  Pt needs a hospital follow up within the next 7 days, next available is 05/04/24. Provider sent pt to ER on 07/22.    Pt callback [phone]

## 2024-04-01 NOTE — Transitions of Care (Post Inpatient/ED Visit) (Signed)
   04/01/2024  Name: Samuel Lawson MRN: [id number] DOB: 1946-05-15  Today's TOC FU Call Status: Today's TOC FU Call Status:: Successful TOC FU Call Completed TOC FU Call Complete Date: 04/01/24 Patient's Name and Date of Birth confirmed.  Transition Care Management Follow-up Telephone Call Date of Discharge: 03/31/24 Discharge Facility: [facility]) Type of Discharge: Inpatient Admission Primary Inpatient Discharge Diagnosis:: symptomatic bradycardia How have you been since you were released from the hospital?: Better (He stated he is feeling good and he is very thankful that Ms Theotis, NP sent him to the ED and took such good care of him) Any questions or concerns?: No  Items Reviewed: Did you receive and understand the discharge instructions provided?: Yes Medications obtained,verified, and reconciled?: Yes (Medications Reviewed) (We reviewed the med list and he said he has all of his medications except he is not sure he has the vascepa . He will need to check on that.  He confirmed her has a glucometer and has been checking his blood sugars and said they usually run 90-120.) Any new allergies since your discharge?: No Dietary orders reviewed?: Yes Type of Diet Ordered:: heart healthy, low sodium, diabetic .  he said he also does intermittent fasting. Do you have support at home?: Yes People in Home [RPT]: alone Name of Support/Comfort Primary Source: He said he sees his daughter occasionally.  Medications Reviewed Today: Medications Reviewed Today   Medications were not reviewed in this encounter     Home Care and Equipment/Supplies: Were Home Health Services Ordered?: No Any new equipment or medical supplies ordered?: No  Functional Questionnaire: Do you need assistance with bathing/showering or dressing?: No Do you need assistance with meal preparation?: No Do you need assistance with eating?: No Do you have difficulty maintaining continence: No Do you need  assistance with getting out of bed/getting out of a chair/moving?: No Do you have difficulty managing or taking your medications?: No  Follow up appointments reviewed: PCP Follow-up appointment confirmed?: Yes Date of PCP follow-up appointment?: 04/13/24 Follow-up Provider: Haze Theotis, NP Specialist Hospital Follow-up appointment confirmed?: Yes Date of Specialist follow-up appointment?: 04/19/24 Follow-Up Specialty Provider:: cardiology.  He confirmed he is currently has the Zio patch on. Do you need transportation to your follow-up appointment?: No Do you understand care options if your condition(s) worsen?: Yes-patient verbalized understanding    SIGNATURE.  Slater Diesel, RN

## 2024-04-01 NOTE — Telephone Encounter (Signed)
 Called patient verified name and date of birth. Patient has been scheduled for a hospital follow up appointment.

## 2024-04-02 ENCOUNTER — Telehealth: Payer: Self-pay | Admitting: Student in an Organized Health Care Education/Training Program

## 2024-04-02 ENCOUNTER — Telehealth: Payer: Self-pay | Admitting: Cardiology

## 2024-04-02 NOTE — Telephone Encounter (Signed)
   Cardiac Monitor Alert  Date of alert:  04/02/2024   Patient Name: Samuel Lawson  DOB: 05-10-1946  MRN: [id number]   [facility] Cardiologist: Shelda Bruckner, MD  [facility] EP:  None    Monitor Information: Long Term Monitor-Live Telemetry [ZioAT]  Reason:  Complete heart block Ordering provider:  Ganji   Alert Complete Heart Block This is the 2nd alert for this rhythm.   Next Cardiology Appointment   Date:  04/21/24  Provider:  Scot Ford, PA-C (post hospital follow up visit)  The patient was contacted today.  He is asymptomatic. Arrhythmia, symptoms and history reviewed with Dr. [place] DOD.    Plan:  Patient has follow up. Message to Dr. Ladona (ordering provider) to see if he would like to refer to EP outpatient.   Other: Patient reports he was sleeping and is not aware of any symptoms. He denies SOB or chest pain, syncope or near-syncope. ED precautions reviewed, patient verbalizes understanding to call 911 or present to ED if he experiences chest pain, SOB, weakness or syncope/near syncope.  Geni LITTIE Sar, RN  04/02/2024 9:05 AM

## 2024-04-02 NOTE — Telephone Encounter (Signed)
 Received a page from [facility] that Mr. Freeland went into complete heart block for 8.6 seconds (heart decreased to 36 bpm).  I called patient and he was sleeping during that time and did not have any symptoms. Upon reviewing his chart, Mr. Teale was just discharged 2 days ago and during that admission he did have brief episodes of complete heart block when he went down to a heart rate of around 40 bpm, similar to today's episode. Pacemaker was not recommended. It seems as though the Zio patch was obtained to assess the burden of these episodes. Given the patient's lack of symptoms and the very recent evaluation for similar occurrence 2 days ago, I did not ask him to present to the emergency department, but rather I notify his cardiologist.    Merlene Blood, MD MS  Cardiology [place]

## 2024-04-02 NOTE — Telephone Encounter (Signed)
 Fellow signed out that she was called multiple times with episodes of CHB during the night. Currently wearing a live monitor for evaluation of the same. I spoke with the patient, these episodes were all nocturnal and asymptomatic. He has not experienced any daytime symptoms. Discussed with Dr. Ladona and curb-sided EP from recent admission, will continue with current monitoring plan for the time being. Patient advised on ER precautions.

## 2024-04-02 NOTE — Telephone Encounter (Signed)
 Calling with abn results

## 2024-04-02 NOTE — Telephone Encounter (Signed)
 Six more episodes of CHB overnight with a HR in the high 20s-mid 30s with durations averaging 8 seconds. Samuel Lawson was called and asymptomatic and was he was asleep.   I will send another note to his cardiologist  Merlene Blood, MD MS  Cardiology

## 2024-04-03 ENCOUNTER — Telehealth: Payer: Self-pay | Admitting: Physician Assistant

## 2024-04-03 ENCOUNTER — Telehealth: Payer: Self-pay | Admitting: Cardiothoracic Surgery

## 2024-04-03 NOTE — Telephone Encounter (Addendum)
   [employer] (Zio) called the answering service after-hours today.  Patient recently seen in the hospital with bradycardia/CHB and near-syncope. Carvedilol  was stopped and monitor placed. Phone notes indicate patient has since had episodes of CHB during sleeping hours, asymptomatic when called, and that case was discussed with Dr. Ladona who had curbsided EP - Dr. Ladona reocmmended to continue to observe for now.  We received another alert from overnight, that patient had an alert at 6:50am for CHB with rates 28->33bpm, 4 episodes, totaling  33 seconds. When the patient was contacted by [facility], he reported he was asymptomatic and sleeping at the time of these events. They did instruct him to send an updated tracing which shows SB 58bpm with Mobitz 1 AVB without prolonged bradycardia or pauses. I also contacted the patient who reports he feels fine without any symptoms. I did d/w Dr. Nancey, on call for EP, who felt that asymptomatic nocturnal bradycardia does not currently require any intervention, and therefore we recommended to continue the monitor as planned per Dr. Ladona. The patient verbalized understanding and gratitude.  Will cc to Dr. Ladona to make him aware.  Kadian Barcellos N Makenley Shimp, PA-C

## 2024-04-03 NOTE — Telephone Encounter (Signed)
 Called by [employer] with 2 CHB episodes.  2:08am  CHB lasting 12 secs with HR average 33 2:30am  CHB with HR average 28. Surrounding rhythm sinus w/ Mobitz  Patient reported he was sleeping and asymptomatic.

## 2024-04-04 ENCOUNTER — Encounter (HOSPITAL_COMMUNITY): Payer: Self-pay | Admitting: Internal Medicine

## 2024-04-04 ENCOUNTER — Other Ambulatory Visit: Payer: Self-pay

## 2024-04-04 ENCOUNTER — Inpatient Hospital Stay (HOSPITAL_COMMUNITY)
Admission: EM | Admit: 2024-04-04 | Discharge: 2024-04-07 | DRG: 244 | Disposition: A | Discharge status: Home / Self Care | Attending: Internal Medicine | Admitting: Internal Medicine

## 2024-04-04 DIAGNOSIS — R001 Bradycardia, unspecified: Secondary | ICD-10-CM | POA: Diagnosis present

## 2024-04-04 DIAGNOSIS — K439 Ventral hernia without obstruction or gangrene: Secondary | ICD-10-CM | POA: Diagnosis present

## 2024-04-04 DIAGNOSIS — I129 Hypertensive chronic kidney disease with stage 1 through stage 4 chronic kidney disease, or unspecified chronic kidney disease: Secondary | ICD-10-CM | POA: Diagnosis present

## 2024-04-04 DIAGNOSIS — N189 Chronic kidney disease, unspecified: Secondary | ICD-10-CM | POA: Diagnosis present

## 2024-04-04 DIAGNOSIS — Z79899 Other long term (current) drug therapy: Secondary | ICD-10-CM

## 2024-04-04 DIAGNOSIS — Z950 Presence of cardiac pacemaker: Secondary | ICD-10-CM | POA: Diagnosis not present

## 2024-04-04 DIAGNOSIS — I2510 Atherosclerotic heart disease of native coronary artery without angina pectoris: Secondary | ICD-10-CM | POA: Diagnosis present

## 2024-04-04 DIAGNOSIS — Z905 Acquired absence of kidney: Secondary | ICD-10-CM

## 2024-04-04 DIAGNOSIS — I459 Conduction disorder, unspecified: Secondary | ICD-10-CM | POA: Diagnosis present

## 2024-04-04 DIAGNOSIS — F17210 Nicotine dependence, cigarettes, uncomplicated: Secondary | ICD-10-CM | POA: Diagnosis present

## 2024-04-04 DIAGNOSIS — I441 Atrioventricular block, second degree: Secondary | ICD-10-CM | POA: Diagnosis not present

## 2024-04-04 DIAGNOSIS — E785 Hyperlipidemia, unspecified: Secondary | ICD-10-CM | POA: Diagnosis present

## 2024-04-04 DIAGNOSIS — Z7982 Long term (current) use of aspirin: Secondary | ICD-10-CM

## 2024-04-04 DIAGNOSIS — I442 Atrioventricular block, complete: Principal | ICD-10-CM | POA: Diagnosis present

## 2024-04-04 DIAGNOSIS — R7303 Prediabetes: Secondary | ICD-10-CM | POA: Diagnosis present

## 2024-04-04 LAB — CBC WITH DIFFERENTIAL/PLATELET
Abs Immature Granulocytes: 0.01 K/uL (ref 0.00–0.07)
Basophils Absolute: 0 K/uL (ref 0.0–0.1)
Basophils Relative: 0 %
Eosinophils Absolute: 0.1 K/uL (ref 0.0–0.5)
Eosinophils Relative: 2 %
HCT: 47.6 % (ref 39.0–52.0)
Hemoglobin: 15.4 g/dL (ref 13.0–17.0)
Immature Granulocytes: 0 %
Lymphocytes Relative: 16 %
Lymphs Abs: 1 K/uL (ref 0.7–4.0)
MCH: 31.8 pg (ref 26.0–34.0)
MCHC: 32.4 g/dL (ref 30.0–36.0)
MCV: 98.3 fL (ref 80.0–100.0)
Monocytes Absolute: 0.6 K/uL (ref 0.1–1.0)
Monocytes Relative: 10 %
Neutro Abs: 4.4 K/uL (ref 1.7–7.7)
Neutrophils Relative %: 72 %
Platelets: 102 K/uL — ABNORMAL LOW (ref 150–400)
RBC: 4.84 MIL/uL (ref 4.22–5.81)
RDW: 12.7 % (ref 11.5–15.5)
WBC: 6.2 K/uL (ref 4.0–10.5)
nRBC: 0 % (ref 0.0–0.2)

## 2024-04-04 LAB — COMPREHENSIVE METABOLIC PANEL WITH GFR
ALT: 13 U/L (ref 0–44)
AST: 16 U/L (ref 15–41)
Albumin: 3.7 g/dL (ref 3.5–5.0)
Alkaline Phosphatase: 50 U/L (ref 38–126)
Anion gap: 9 (ref 5–15)
BUN: 17 mg/dL (ref 8–23)
CO2: 20 mmol/L — ABNORMAL LOW (ref 22–32)
Calcium: 9 mg/dL (ref 8.9–10.3)
Chloride: 110 mmol/L (ref 98–111)
Creatinine, Ser: 1.13 mg/dL (ref 0.61–1.24)
GFR, Estimated: >60 mL/min (ref >60)
Glucose, Bld: 120 mg/dL — ABNORMAL HIGH (ref 70–99)
Potassium: 4.1 mmol/L (ref 3.5–5.1)
Sodium: 139 mmol/L (ref 135–145)
Total Bilirubin: 1.2 mg/dL (ref 0.0–1.2)
Total Protein: 6.6 g/dL (ref 6.5–8.1)

## 2024-04-04 MED ORDER — LOSARTAN POTASSIUM 50 MG PO TABS
25.0000 mg | ORAL_TABLET | Freq: Every day | ORAL | Status: DC
  Administered 2024-04-05: 25 mg via ORAL
  Filled 2024-04-04: qty 1

## 2024-04-04 MED ORDER — ACETAMINOPHEN 325 MG PO TABS
650.0000 mg | ORAL_TABLET | ORAL | Status: DC | PRN
  Administered 2024-04-07 (×2): 650 mg via ORAL
  Filled 2024-04-04 (×2): qty 2

## 2024-04-04 MED ORDER — BUPROPION HCL ER (SR) 150 MG PO TB12
150.0000 mg | ORAL_TABLET | Freq: Two times a day (BID) | ORAL | Status: DC
  Administered 2024-04-04 – 2024-04-07 (×7): 150 mg via ORAL
  Filled 2024-04-04 (×8): qty 1

## 2024-04-04 MED ORDER — ASPIRIN 81 MG PO TBEC
81.0000 mg | DELAYED_RELEASE_TABLET | Freq: Every day | ORAL | Status: DC
  Administered 2024-04-04 – 2024-04-06 (×3): 81 mg via ORAL
  Filled 2024-04-04 (×3): qty 1

## 2024-04-04 MED ORDER — TAMSULOSIN HCL 0.4 MG PO CAPS
0.4000 mg | ORAL_CAPSULE | Freq: Two times a day (BID) | ORAL | Status: DC
  Administered 2024-04-04 – 2024-04-07 (×6): 0.4 mg via ORAL
  Filled 2024-04-04 (×6): qty 1

## 2024-04-04 MED ORDER — ICOSAPENT ETHYL 1 G PO CAPS
1.0000 g | ORAL_CAPSULE | Freq: Two times a day (BID) | ORAL | 0 refills | Status: DC
  Filled 2024-04-04: qty 60, 30d supply, fill #0

## 2024-04-04 MED ORDER — NITROGLYCERIN 0.4 MG SL SUBL: 0.4000 mg | SUBLINGUAL_TABLET | SUBLINGUAL | Status: DC | PRN

## 2024-04-04 MED ORDER — HEPARIN SODIUM (PORCINE) 5000 UNIT/ML IJ SOLN
5000.0000 [IU] | Freq: Three times a day (TID) | INTRAMUSCULAR | Status: DC
  Administered 2024-04-04 – 2024-04-05 (×3): 5000 [IU] via SUBCUTANEOUS
  Filled 2024-04-04 (×3): qty 1

## 2024-04-04 MED ORDER — ATORVASTATIN CALCIUM 40 MG PO TABS
40.0000 mg | ORAL_TABLET | Freq: Every day | ORAL | Status: DC
  Administered 2024-04-04 – 2024-04-06 (×3): 40 mg via ORAL
  Filled 2024-04-04 (×3): qty 1

## 2024-04-04 MED ORDER — ICOSAPENT ETHYL 1 G PO CAPS
1.0000 g | ORAL_CAPSULE | Freq: Two times a day (BID) | ORAL | Status: DC
  Administered 2024-04-05 – 2024-04-07 (×4): 1 g via ORAL
  Filled 2024-04-04 (×5): qty 1

## 2024-04-04 MED ORDER — ONDANSETRON HCL 4 MG/2ML IJ SOLN: 4.0000 mg | Freq: Four times a day (QID) | INTRAMUSCULAR | Status: DC | PRN

## 2024-04-04 NOTE — ED Triage Notes (Signed)
 Pt to ed from home because halter monitor was getting readings around 30. Pt denies s/s, relays he was called by the company and told his heart rate was to low. Pt was seen in ED recently for same.

## 2024-04-04 NOTE — ED Provider Notes (Signed)
 [place] EMERGENCY DEPARTMENT AT [facility] Provider Note   CSN: [id number] Arrival date & time: 04/04/24  1130     Patient presents with: Bradycardia   Samuel Lawson is a 78 y.o. male with history of Mobitz type 1 AV block, CKD, hypertension, pre-diabetes, presents with concern for slow heart rate on his Zio. States that he was noted to have a slow heart rate at 8:20 AM around 28-33 beats per minute and then another episode between 9 and 10 AM with similar bradycardic event with heart rate in the 30s.  During these events, patient states he had just woken up and was moving around in his house.  He denies feeling any chest pain or shortness of breath currently.  No syncopal episodes.  Per review of cardiology note from [facility] who spoke with Dr. Acharya, recommendation was given to come to the ER for admission for consideration of probable PPM tomorrow.  Patient's cardiologist is Dr. Ladona   HPI     Prior to Admission medications   Medication Sig Start Date End Date Taking? Authorizing Provider  albuterol  (VENTOLIN  HFA) 108 (90 Base) MCG/ACT inhaler Inhale 1-2 puffs into the lungs every 6 (six) hours as needed. 07/04/23   Theotis Haze ORN, NP  aspirin  EC 81 MG tablet Take 81 mg by mouth at bedtime. Swallow whole.    [provider]  atorvastatin  (LIPITOR) 40 MG tablet Take 1 tablet (40 mg total) by mouth at bedtime. 07/04/23   Fleming, Zelda W, NP  Blood Glucose Calibration (ACCU-CHEK GUIDE CONTROL) LIQD 1 each by In Vitro route once as needed for up to 1 dose. R73.03 01/07/20   Fleming, Zelda W, NP  buPROPion  (WELLBUTRIN  SR) 150 MG 12 hr tablet Take 1 tablet (150 mg total) by mouth 2 (two) times daily. 03/30/24   Fleming, Zelda W, NP  Cholecalciferol  (VITAMIN D3) 125 MCG (5000 UT) CAPS Take 5,000 Units by mouth daily. When compliant    [provider]  glucose blood (ACCU-CHEK GUIDE) test strip Use as instructed. Check blood glucose by fingerstick  once per day. R73.03 10/23/21   Newlin, Enobong, MD  icosapent  Ethyl (VASCEPA ) 1 g capsule Take 1 capsule (1 g total) by mouth 2 (two) times daily. Please schedule appt with Zelda Fleming. 03/02/24   Fleming, Zelda W, NP  losartan  (COZAAR ) 25 MG tablet Take 1 tablet (25 mg total) by mouth daily. 03/31/24 03/31/25  Ladona Heinz, MD  Multiple Vitamins-Minerals (MULTIVITAMIN WITH MINERALS) tablet Take 1 tablet by mouth daily.    [provider]  tamsulosin  (FLOMAX ) 0.4 MG CAPS capsule Take 2 capsules (0.8 mg total) by mouth daily. Please schedule appt with Zelda Fleming. Patient taking differently: Take 0.4 mg by mouth 2 (two) times daily. Please schedule appt with Zelda Fleming. 03/23/24   Newlin, Enobong, MD  promethazine  (PHENERGAN ) 25 MG tablet Take 1 tablet (25 mg total) by mouth every 6 (six) hours as needed for nausea. 06/18/12 12/07/12  Schinlever, Dorothyann, PA-C    Allergies: Patient has no known allergies.    Review of Systems  Neurological:  Positive for dizziness.    Updated Vital Signs BP 112/64 (BP Location: Right Arm)   Pulse (!) 43   Temp 97.7 F (36.5 C) (Oral)   Resp 16   Ht 5' 8 (1.727 m)   Wt 81.6 kg   SpO2 97%   BMI 27.35 kg/m   Physical Exam Vitals and nursing note reviewed.  Constitutional:  Appearance: Normal appearance.  HENT:     Head: Atraumatic.  Cardiovascular:     Rate and Rhythm: Regular rhythm. Bradycardia present.  Pulmonary:     Effort: Pulmonary effort is normal.  Neurological:     General: No focal deficit present.     Mental Status: He is alert.  Psychiatric:        Mood and Affect: Mood normal.        Behavior: Behavior normal.     (all labs ordered are listed, but only abnormal results are displayed) Labs Reviewed  CBC WITH DIFFERENTIAL/PLATELET - Abnormal; Notable for the following components:      Result Value   Platelets 102 (*)    All other components within normal limits  COMPREHENSIVE METABOLIC PANEL WITH GFR -  Abnormal; Notable for the following components:   CO2 20 (*)    Glucose, Bld 120 (*)    All other components within normal limits    EKG: None  Radiology: No results found.   Procedures   Medications Ordered in the ED - No data to display  Clinical Course as of 04/04/24 1341  Sun Apr 04, 2024  1204 Spoke to cardiology, they will consult on patient and provide further reccomendations [AF]    Clinical Course User Index [AF] Veta Palma, PA-C                                 Medical Decision Making Amount and/or Complexity of Data Reviewed Labs: ordered.  Risk Decision regarding hospitalization.     Differential diagnosis includes but is not limited to actually abnormality, thyroid  abnormality, arrhythmia  ED Course:  Upon initial evaluation, patient is well-appearing, no acute distress.  Does have a bradycardia into the 40s and 50s.  Reports he is not currently feeling any chest pain, shortness of breath, or dizziness.  Labs Ordered: I Ordered, and personally interpreted labs.  The pertinent results include:   CBC with low platelets at 102, otherwise within normal limits CMP with mildly elevated glucose at 120.  Otherwise electrolytes within normal limits.  Normal LFTs and creatinine   Cardiac Monitoring: / EKG: The patient was maintained on a cardiac monitor.  I personally viewed and interpreted the cardiac monitored which showed an underlying rhythm of: Sinus bradycardia   Consultations Obtained: I requested consultation with the cardiology Dr. Matt,  and discussed lab and imaging findings as well as pertinent plan - they recommend:  Recommended obtaining BMP and CBC.  They will come to evaluate patient and determine if he needs inpatient admission. 1:40 PM.  Patient admitted with Dr. Acharya with cardiology  Medications Given: None   Impression: Symptomatic bradycardia  Disposition:  Admission with Dr. Loni with cardiology  service    Record Review: External records from outside source obtained and reviewed including telephone encounter last night from cardiology PA Danya Dunn patient was noted to have symptomatic bradycardia with rates in the upper 20s and low 30s.     This chart was dictated using voice recognition software, Dragon. Despite the best efforts of this provider to proofread and correct errors, errors may still occur which can change documentation meaning.       Final diagnoses:  Symptomatic bradycardia    ED Discharge Orders     None          Veta Palma, DEVONNA 04/04/24 1341    Ula Prentice SAUNDERS, MD 04/05/24 [phone]

## 2024-04-04 NOTE — ED Notes (Signed)
PT ASSISTED TO THE RESTROOM

## 2024-04-04 NOTE — ED Notes (Signed)
 Meal tray delivered.

## 2024-04-04 NOTE — H&P (Addendum)
 Cardiology Admission History and Physical   Patient ID: Samuel Lawson MRN: [id number]; DOB: 02/01/1946   Admission date: 04/04/2024  PCP:  Theotis Haze ORN, NP   [facility] Providers Cardiologist:  Gordy Bergamo, MD       Chief Complaint:  bradycardia and complete heart block  Patient Profile: Samuel Lawson is a 78 y.o. male with past medical history of tobacco use, hypertension, hyperlipidemia, CAD and recently diagnosed bradycardia who is being seen 04/04/2024 for the evaluation of bradycardia.  History of Present Illness: Samuel Lawson is a 78 year old male with past medical history of tobacco use, hypertension, hyperlipidemia, CAD and recently diagnosed bradycardia.  He was last seen by Dr. Lonni in October 2021 for follow-up.  Patient underwent stenting of 90% stenosis with thrombus in the distal RCA on 01/31/2000.  Repeat cardiac catheterization on 11/14/2007 by Dr. Herminio revealed 30% mid LAD lesion, total occlusion of proximal RCA with visible thrombus, EF 60% was mild inferoapical hypokinesis, he was treated with balloon angioplasty and a 3.0 x 18 mm stent, distal RCA had a 40% in-stent restenosis, there was 50% mid RCA stenosis.  He was last seen by Dr. Shelda Lonni on 06/28/2020 at which time he was doing well. More recently, he was sent to the [facility], ED on 03/31/2024 from PCPs office due to bradycardia with heart rate down to the 30s.  He was on 3.125 mg twice a day of carvedilol  at the time, this was held during the hospitalization.  Dr. Bergamo curb sided EP service who felt patient did not need pacemaker at the time.  He had Mobitz type I heart block on telemetry during the hospitalization.  Echocardiogram obtained on 03/31/2024 showed EF 55 to 60%, no significant valve issue.  He was ultimately discharged on 2-week heart monitor.  Since then, there has been multiple phone calls regarding nocturnal complete heart block.  This morning, [employer] contact cardiology  again regarding bradycardia and intermittent complete heart block.  Heart rate has been occasionally down to the 30s, this occurs mostly at night however there has been  bradycardic episodes even during the day after the patient woke up.  He was instructed to go to [facility] emergency room.  Talking with the patient, he did have some mild dizziness when he got up this morning, however it primarily happened to him with body position change.  He also feels a little off today, however says he did not get much sleep at night as he was very concerned his heart slowed down during sleep.    Past Medical History:  Diagnosis Date   Acute MI [facility]) 2009   STENTS PLACED   Chronic kidney disease    Collagen vascular disease (HCC)    Hyperlipidemia    Hypertension    Pre-diabetes    Prediabetes    Past Surgical History:  Procedure Laterality Date   ANGIOPLASTY     CARDIAC CATHETERIZATION     INCISIONAL HERNIA REPAIR N/A 05/02/2022   Procedure: LAPAROSCOPIC INCISIONAL HERNIA REPAIR WITH MESH;  Surgeon: Vernetta Berg, MD;  Location: [facility];  Service: General;  Laterality: N/A;   ROBOTIC ASSITED PARTIAL NEPHRECTOMY Left 12/05/2021   Procedure: XI ROBOTIC ASSITED PARTIAL NEPHRECTOMY;  Surgeon: Cam Morene ORN, MD;  Location: [facility];  Service: Urology;  Laterality: Left;  3.5 HRS     Medications Prior to Admission: Prior to Admission medications   Medication Sig Start Date End Date Taking? Authorizing Provider  albuterol  (VENTOLIN  HFA) 108 (90 Base)  MCG/ACT inhaler Inhale 1-2 puffs into the lungs every 6 (six) hours as needed. 07/04/23   Theotis Haze ORN, NP  aspirin  EC 81 MG tablet Take 81 mg by mouth at bedtime. Swallow whole.    [provider]  atorvastatin  (LIPITOR) 40 MG tablet Take 1 tablet (40 mg total) by mouth at bedtime. 07/04/23   Fleming, Zelda W, NP  Blood Glucose Calibration (ACCU-CHEK GUIDE CONTROL) LIQD 1 each by In Vitro route once as needed for up to 1 dose. R73.03  01/07/20   Fleming, Zelda W, NP  buPROPion  (WELLBUTRIN  SR) 150 MG 12 hr tablet Take 1 tablet (150 mg total) by mouth 2 (two) times daily. 03/30/24   Theotis Haze ORN, NP  Cholecalciferol  (VITAMIN D3) 125 MCG (5000 UT) CAPS Take 5,000 Units by mouth daily. When compliant    [provider]  glucose blood (ACCU-CHEK GUIDE) test strip Use as instructed. Check blood glucose by fingerstick once per day. R73.03 10/23/21   Newlin, Enobong, MD  icosapent  Ethyl (VASCEPA ) 1 g capsule Take 1 capsule (1 g total) by mouth 2 (two) times daily. Please schedule appt with Zelda Fleming. 03/02/24   Fleming, Zelda W, NP  losartan  (COZAAR ) 25 MG tablet Take 1 tablet (25 mg total) by mouth daily. 03/31/24 03/31/25  Ladona Heinz, MD  Multiple Vitamins-Minerals (MULTIVITAMIN WITH MINERALS) tablet Take 1 tablet by mouth daily.    [provider]  tamsulosin  (FLOMAX ) 0.4 MG CAPS capsule Take 2 capsules (0.8 mg total) by mouth daily. Please schedule appt with Zelda Fleming. Patient taking differently: Take 0.4 mg by mouth 2 (two) times daily. Please schedule appt with Zelda Fleming. 03/23/24   Newlin, Enobong, MD  promethazine  (PHENERGAN ) 25 MG tablet Take 1 tablet (25 mg total) by mouth every 6 (six) hours as needed for nausea. 06/18/12 12/07/12  Schinlever, Dorothyann, PA-C     Allergies:   No Known Allergies  Social History:   Social History   Socioeconomic History   Marital status: Single    Spouse name: Not on file   Number of children: 2   Years of education: B.S.   Highest education level: Not on file  Occupational History    Employer: SELF EMPLOYED    Comment: Computers - Works for [employer]  Tobacco Use   Smoking status: Some Days    Current packs/day: 1.00    Average packs/day: 1 pack/day for 51.6 years (51.6 ttl pk-yrs)    Types: Cigarettes    Start date: 09/09/1972   Smokeless tobacco: Never   Tobacco comments:    states new years resolution is to quit smoking  Vaping Use   Vaping status:  Never Used  Substance and Sexual Activity   Alcohol use: No   Drug use: No   Sexual activity: Not on file  Other Topics Concern   Not on file  Social History Narrative   Patient lives at home alone and he is single. Reports being married in the past. Patient is self employed and works with computers.   Education B.S.   Right handed.   Caffeine one cup of tea daily and two pepsi daily.   Social Drivers of Corporate investment banker Strain: Low Risk  (04/22/2023)   Overall Financial Resource Strain (CARDIA)    Difficulty of Paying Living Expenses: Not hard at all  Food Insecurity: No Food Insecurity (03/30/2024)   Hunger Vital Sign    Worried About Running Out of Food in the Last Year: Never true  Ran Out of Food in the Last Year: Never true  Transportation Needs: No Transportation Needs (03/30/2024)   PRAPARE - Administrator, Civil Service (Medical): No    Lack of Transportation (Non-Medical): No  Physical Activity: Insufficiently Active (04/22/2023)   Exercise Vital Sign    Days of Exercise per Week: 2 days    Minutes of Exercise per Session: 30 min  Stress: No Stress Concern Present (07/04/2023)   [employer] of Occupational Health - Occupational Stress Questionnaire    Feeling of Stress : Not at all  Social Connections: Moderately Isolated (03/30/2024)   Social Connection and Isolation Panel    Frequency of Communication with Friends and Family: Twice a week    Frequency of Social Gatherings with Friends and Family: Three times a week    Attends Religious Services: Never    Active Member of Clubs or Organizations: Yes    Attends Banker Meetings: Never    Marital Status: Divorced  Catering manager Violence: Not At Risk (03/30/2024)   Humiliation, Afraid, Rape, and Kick questionnaire    Fear of Current or Ex-Partner: No    Emotionally Abused: No    Physically Abused: No    Sexually Abused: No     Family History:   The patient's family  history includes Alzheimer's disease in his father; Heart Problems in his mother; High blood pressure in his father; Stroke in his father. There is no history of Colon cancer, Colon polyps, Esophageal cancer, Rectal cancer, or Stomach cancer.    ROS:  Please see the history of present illness.  All other ROS reviewed and negative.     Physical Exam/Data: Vitals:   04/04/24 1138 04/04/24 1142 04/04/24 1143 04/04/24 1200  BP:  (!) 132/109  113/83  Pulse:  (!) 52  (!) 48  Resp:  16  12  Temp: 97.7 F (36.5 C)     TempSrc: Oral     SpO2:  99%  100%  Weight:   81.6 kg   Height:   5' 8 (1.727 m)    No intake or output data in the 24 hours ending 04/04/24 1259    04/04/2024   11:43 AM 03/30/2024   12:04 PM 03/30/2024   10:09 AM  Last 3 Weights  Weight (lbs) 179 lb 14.3 oz 180 lb 182 lb 12.8 oz  Weight (kg) 81.6 kg 81.647 kg 82.918 kg     Body mass index is 27.35 kg/m.  General:  Well nourished, well developed, in no acute distress HEENT: normal Neck: no JVD Vascular: No carotid bruits; Distal pulses 2+ bilaterally   Cardiac:  normal S1, S2; RRR; no murmur  Lungs:  clear to auscultation bilaterally, no wheezing, rhonchi or rales  Abd: soft, nontender, no hepatomegaly  Ext: no edema Musculoskeletal:  No deformities, BUE and BLE strength normal and equal Skin: warm and dry  Neuro:  CNs 2-12 intact, no focal abnormalities noted Psych:  Normal affect   EKG:  The ECG that was done and was personally reviewed and demonstrates second-degree type I heart block.  Relevant CV Studies:  Echo 03/31/2024 1. Left ventricular ejection fraction, by estimation, is 55 to 60%. The  left ventricle has normal function. The left ventricle has no regional  wall motion abnormalities. Left ventricular diastolic function could not  be evaluated.   2. Right ventricular systolic function is normal. The right ventricular  size is normal.   3. The mitral valve is normal in structure.  No evidence of  mitral valve  regurgitation. No evidence of mitral stenosis.   4. The aortic valve is normal in structure. Aortic valve regurgitation is  not visualized. No aortic stenosis is present.   5. The inferior vena cava is dilated in size with >50% respiratory  variability, suggesting right atrial pressure of 8 mmHg.    Laboratory Data: High Sensitivity Troponin:  No results for input(s): TROPONINIHS in the last 720 hours.    Chemistry Recent Labs  Lab 03/30/24 1221 03/31/24 0241 04/04/24 1154  NA 140 141 139  K 4.4 3.6 4.1  CL 106 110 110  CO2 23 23 20*  GLUCOSE 114* 99 120*  BUN 14 12 17   CREATININE 1.51* 1.03 1.13  CALCIUM  9.1 8.3* 9.0  MG 2.5*  --   --   GFRNONAA 48* >60 >60  ANIONGAP 11 8 9     Recent Labs  Lab 03/30/24 1221 04/04/24 1154  PROT 7.7 6.6  ALBUMIN 4.4 3.7  AST 22 16  ALT 11 13  ALKPHOS 65 50  BILITOT 1.6* 1.2   Lipids  Recent Labs  Lab 03/30/24 1221  CHOL 120  TRIG 72  HDL 62  LDLCALC 44  CHOLHDL 1.9   Hematology Recent Labs  Lab 03/31/24 0241 04/04/24 1154  WBC 6.0 6.2  RBC 4.47 4.84  HGB 14.3 15.4  HCT 43.0 47.6  MCV 96.2 98.3  MCH 32.0 31.8  MCHC 33.3 32.4  RDW 12.7 12.7  PLT 101* 102*   Thyroid   Recent Labs  Lab 03/30/24 1400  TSH 2.874   BNPNo results for input(s): BNP, PROBNP in the last 168 hours.  DDimer No results for input(s): DDIMER in the last 168 hours.  Radiology/Studies:  No results found.   Assessment and Plan: Bradycardia: Noted to have intermittent complete heart block during the last admission, low-dose 3.125 mg twice a day of carvedilol  was discontinued and the patient was discharged on her monitor.  EP service was curbsided during the recent hospitalization who felt he did not need pacemaker at the time.  Heart monitor since discharge has revealed sinus rhythm, Mobitz type I heart block and occasional nocturnal complete heart block.  However this morning, further strips from the heart monitor even  after patient was awake showed bradycardia into the 30s.  Patient was instructed to come to the emergency room. He was having mild dizziness this morning after getting up. HR in ED between 30-60s. Admit to obs, EP to see tomorrow.   CAD: remote distal RCA stent in 2001 and prox RCA stent in 2009 for occluded RCA with thrombus  Hypertension: Well controlled BP, not on AV nodal blocking agent due to bradycardia. Continue losartan   Hyperlipidemia: lipitor 40mg  daily.    Risk Assessment/Risk Scores:         Code Status: Full Code  Severity of Illness: The appropriate patient status for this patient is OBSERVATION. Observation status is judged to be reasonable and necessary in order to provide the required intensity of service to ensure the patient's safety. The patient's presenting symptoms, physical exam findings, and initial radiographic and laboratory data in the context of their medical condition is felt to place them at decreased risk for further clinical deterioration. Furthermore, it is anticipated that the patient will be medically stable for discharge from the hospital within 2 midnights of admission.   For questions or updates, please contact [facility] Please consult [email or web address] for contact info under     Signed, Hao  Sparks, [place]  04/04/2024 12:59 PM   Patient seen and examined with Scot Ford PA.  Agree as above, with the following exceptions and changes as noted below.  78 year old male with a history of coronary artery disease remotely and recent history of symptomatic bradycardia with some presyncope in the preceding 6 months on carvedilol  noted to be in hemodynamically stable complete heart block.  Seen in the ER last week cardiology recommended stopping carvedilol  and placing an outpatient telemetry monitor.  On monitor patient was noted to have episodes of complete heart block but primarily while sleeping, and on curbside review this was felt to be relatively low risk  if asymptomatic.  At that time patient noted overall asymptomatic status.  Patient again had abnormal rhythms overnight and was contacted by the telemetry service, on review today with our PA patient describes some dizziness upon waking though this did occur with position change, however at 9 AM the patient is noted on monitor to be in a high-grade AV block, appears consistent with complete heart block. Gen: NAD, CV: Bradycardic and mildly irregular, no murmurs, Lungs: clear, Abd: soft, Extrem: Warm, well perfused, no edema, Neuro/Psych: alert and oriented x 3, normal mood and affect. All available labs, radiology testing, previous records reviewed.  Patient has evidence of high-grade AV block and did endorse some symptoms this morning, in the previous 6 months some presyncope.  Patient is in good spirits and overall hemodynamically stable.  Given these findings during waking hours, we will admit the patient and have electrophysiology review tomorrow.  We will make him n.p.o. after midnight in the event that pacemaker is recommended.  Patient and I briefly discussed pacing indications risks and benefits, and this will be expanded upon by electrophysiology tomorrow.  He attributes his symptoms today to lack of sleep from being called by the monitor company over the last several nights.  Soyla DELENA Merck, MD 04/04/24 2:07 PM

## 2024-04-05 ENCOUNTER — Other Ambulatory Visit: Payer: Self-pay

## 2024-04-05 DIAGNOSIS — F17210 Nicotine dependence, cigarettes, uncomplicated: Secondary | ICD-10-CM | POA: Diagnosis present

## 2024-04-05 DIAGNOSIS — Z79899 Other long term (current) drug therapy: Secondary | ICD-10-CM | POA: Diagnosis not present

## 2024-04-05 DIAGNOSIS — I459 Conduction disorder, unspecified: Secondary | ICD-10-CM | POA: Diagnosis present

## 2024-04-05 DIAGNOSIS — E785 Hyperlipidemia, unspecified: Secondary | ICD-10-CM | POA: Diagnosis present

## 2024-04-05 DIAGNOSIS — I2510 Atherosclerotic heart disease of native coronary artery without angina pectoris: Secondary | ICD-10-CM | POA: Diagnosis present

## 2024-04-05 DIAGNOSIS — N189 Chronic kidney disease, unspecified: Secondary | ICD-10-CM | POA: Diagnosis present

## 2024-04-05 DIAGNOSIS — R7303 Prediabetes: Secondary | ICD-10-CM | POA: Diagnosis present

## 2024-04-05 DIAGNOSIS — Z7982 Long term (current) use of aspirin: Secondary | ICD-10-CM | POA: Diagnosis not present

## 2024-04-05 DIAGNOSIS — Z905 Acquired absence of kidney: Secondary | ICD-10-CM | POA: Diagnosis not present

## 2024-04-05 DIAGNOSIS — R001 Bradycardia, unspecified: Secondary | ICD-10-CM | POA: Diagnosis present

## 2024-04-05 DIAGNOSIS — I129 Hypertensive chronic kidney disease with stage 1 through stage 4 chronic kidney disease, or unspecified chronic kidney disease: Secondary | ICD-10-CM | POA: Diagnosis present

## 2024-04-05 DIAGNOSIS — I441 Atrioventricular block, second degree: Secondary | ICD-10-CM | POA: Diagnosis not present

## 2024-04-05 DIAGNOSIS — I442 Atrioventricular block, complete: Principal | ICD-10-CM

## 2024-04-05 DIAGNOSIS — K439 Ventral hernia without obstruction or gangrene: Secondary | ICD-10-CM | POA: Diagnosis present

## 2024-04-05 LAB — CBC
HCT: 44.1 % (ref 39.0–52.0)
HCT: 44.2 % (ref 39.0–52.0)
Hemoglobin: 14.6 g/dL (ref 13.0–17.0)
Hemoglobin: 14.5 g/dL (ref 13.0–17.0)
MCH: 31.8 pg (ref 26.0–34.0)
MCH: 31.4 pg (ref 26.0–34.0)
MCHC: 33.1 g/dL (ref 30.0–36.0)
MCHC: 32.8 g/dL (ref 30.0–36.0)
MCV: 96.1 fL (ref 80.0–100.0)
MCV: 95.7 fL (ref 80.0–100.0)
Platelets: 106 K/uL — ABNORMAL LOW (ref 150–400)
Platelets: 109 K/uL — ABNORMAL LOW (ref 150–400)
RBC: 4.59 MIL/uL (ref 4.22–5.81)
RBC: 4.62 MIL/uL (ref 4.22–5.81)
RDW: 12.7 % (ref 11.5–15.5)
RDW: 12.8 % (ref 11.5–15.5)
WBC: 6.1 K/uL (ref 4.0–10.5)
WBC: 5.8 K/uL (ref 4.0–10.5)
nRBC: 0 % (ref 0.0–0.2)
nRBC: 0 % (ref 0.0–0.2)

## 2024-04-05 LAB — BASIC METABOLIC PANEL WITH GFR
Anion gap: 10 (ref 5–15)
BUN: 17 mg/dL (ref 8–23)
CO2: 23 mmol/L (ref 22–32)
Calcium: 9 mg/dL (ref 8.9–10.3)
Chloride: 106 mmol/L (ref 98–111)
Creatinine, Ser: 1.2 mg/dL (ref 0.61–1.24)
GFR, Estimated: >60 mL/min (ref >60)
Glucose, Bld: 105 mg/dL — ABNORMAL HIGH (ref 70–99)
Potassium: 3.9 mmol/L (ref 3.5–5.1)
Sodium: 139 mmol/L (ref 135–145)

## 2024-04-05 LAB — CREATININE, SERUM
Creatinine, Ser: 1.25 mg/dL — ABNORMAL HIGH (ref 0.61–1.24)
GFR, Estimated: 60 mL/min — ABNORMAL LOW (ref >60)

## 2024-04-05 NOTE — ED Notes (Addendum)
 When patient started ambulating is heart rate was 75 and pulse 74. He continue to ambulate in the hallway HR 64 and pulse 70. Patient HR decrease to 53 and pulse 64. Patient reported her felt dizzy, and we proceeded walking back to the room. When patient got back to the room is heart  rate was 54 and pulse 60.

## 2024-04-05 NOTE — Progress Notes (Signed)
 Rounding Note   Patient Name: Samuel Lawson Date of Encounter: 04/05/2024  [facility] Cardiologist: Gordy Bergamo, MD   Subjective Pt denies CP  Breathing is OK    Notes occasional dizziness at home with standing   Some days he does just fine though   Scheduled Meds:  aspirin  EC  81 mg Oral QHS   atorvastatin   40 mg Oral QHS   buPROPion   150 mg Oral BID   heparin   5,000 Units Subcutaneous Q8H   icosapent  Ethyl  1 g Oral BID   losartan   25 mg Oral Daily   tamsulosin   0.4 mg Oral BID   Continuous Infusions:  PRN Meds: acetaminophen , nitroGLYCERIN , ondansetron  (ZOFRAN ) IV   Vital Signs  Vitals:   04/05/24 0400 04/05/24 0500 04/05/24 0800 04/05/24 0842  BP: (!) 101/42 96/72 96/62    Pulse: (!) 44 (!) 46 (!) 41   Resp: 20 19 18    Temp:    (!) 97.5 F (36.4 C)  TempSrc:    Oral  SpO2: 94% 96% 95%   Weight:      Height:        Intake/Output Summary (Last 24 hours) at 04/05/2024 0907 Last data filed at 04/05/2024 0355 Gross per 24 hour  Intake --  Output 665 ml  Net -665 ml      04/04/2024   11:43 AM 03/30/2024   12:04 PM 03/30/2024   10:09 AM  Last 3 Weights  Weight (lbs) 179 lb 14.3 oz 180 lb 182 lb 12.8 oz  Weight (kg) 81.6 kg 81.647 kg 82.918 kg      Telemetry SB, SR   Longest pause 2.4 sec   Rates 30s to 60s  - Personally Reviewed  ECG   - Personally Reviewed  Physical Exam  GEN: No acute distress.   Neck: No JVD Cardiac: RRR, no murmurs, Abdomen  MIld tenderness    Ventral hernia   Respiratory: Clear to auscultation GI: Soft, nontender, non-distended  MS: No edema; No deformity. Neuro:  Nonfocal  Psych: Normal affect   Labs High Sensitivity Troponin:  No results for input(s): TROPONINIHS in the last 720 hours.   Chemistry Recent Labs  Lab 03/30/24 1221 03/31/24 0241 04/04/24 1154 04/05/24 0516  NA 140 141 139 139  K 4.4 3.6 4.1 3.9  CL 106 110 110 106  CO2 23 23 20* 23  GLUCOSE 114* 99 120* 105*  BUN 14 12 17 17    CREATININE 1.51* 1.03 1.13 1.20  CALCIUM  9.1 8.3* 9.0 9.0  MG 2.5*  --   --   --   PROT 7.7  --  6.6  --   ALBUMIN 4.4  --  3.7  --   AST 22  --  16  --   ALT 11  --  13  --   ALKPHOS 65  --  50  --   BILITOT 1.6*  --  1.2  --   GFRNONAA 48* >60 >60 >60  ANIONGAP 11 8 9 10     Lipids  Recent Labs  Lab 03/30/24 1221  CHOL 120  TRIG 72  HDL 62  LDLCALC 44  CHOLHDL 1.9    Hematology Recent Labs  Lab 03/31/24 0241 04/04/24 1154 04/05/24 0516  WBC 6.0 6.2 6.1  RBC 4.47 4.84 4.59  HGB 14.3 15.4 14.6  HCT 43.0 47.6 44.1  MCV 96.2 98.3 96.1  MCH 32.0 31.8 31.8  MCHC 33.3 32.4 33.1  RDW 12.7 12.7 12.7  PLT 101*  102* 106*   Thyroid   Recent Labs  Lab 03/30/24 1400  TSH 2.874    BNPNo results for input(s): BNP, PROBNP in the last 168 hours.  DDimer No results for input(s): DDIMER in the last 168 hours.   Radiology  No results found.  Cardiac Studies   Patient Profile   78 y.o. male   Assessment & Plan   1  Bradycardia   Pt with hx of bradycardia   carvedilol  held at recent hospitalization  Zio placed   Pt called for nocturnal CHB     Notifed again of daytime bradycardia    Pt told to go t ER.    Here in ER no signficant pauses or periods of profound bradycardia Off of carvedilol  for a few days    EP has seen  Plan to ambulate   2  Dizzienss   Intermittent   Not clearly associated with low HR (Pt did not trigger)     Pt not orthostatic here      Plan to ambulate   2  CAD   Pt with stent to RCA in 2001,  stent to occluded RCA in 2009  3  Hx HTN  BP is 90s to 120s here   FOllow    Pt still on losartan  25   Will hold and follow   4  HL    Keep on lipitor    For questions or updates, please contact [facility] Please consult [email or web address] for contact info under     Signed, Vina Gull, MD  04/05/2024, 9:07 AM

## 2024-04-05 NOTE — Consult Note (Cosign Needed Addendum)
 ELECTROPHYSIOLOGY CONSULT NOTE    Patient ID: Samuel Lawson MRN: [id number], DOB/AGE: May 06, 1946 78 y.o.  Admit date: 04/04/2024 Date of Consult: 04/05/2024  Primary Physician: Samuel Haze ORN, NP Primary Cardiologist: Samuel Lawson  Electrophysiologist: none   Referring Provider: @ATTENDING @  Patient Profile: Samuel Lawson is a 78 y.o. male with a history of bradycardia HTN, HLD, CAD s/p PCI, tobacco use who is being seen today for the evaluation of AV block at the request of Dr. Loni.  HPI:  Samuel Lawson is a 78 y.o. male with PMH as above.  He was recently seen at [facility] after PCP office noted bradycardia to 30s. He was on coreg  at that time which was held throughout hospitalization. 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 He was discharged with 2 week monitor. There have been several calls from [employer] regarding nocturnal CHB.   [employer] contacted cardiology 7/27 regarding bradycardic epsiodes during the day. Patient was contacted and reported mild dizziness and was recommended to proceed to ER for further evaluation.   On discussion with the patient, he does have days where he feels unsteady when walking and off balance when sitting, these have not happened since his coreg  was held. He has not had any syncope. He has noticed times where his veins seem fuller in his hands - and that these are times when he feels well.  He was called multiple times by [employer] and they told him to press the symptom activator button. He never pressed the symptom button when he was having unsteadiness or dizziness.   Currently, he denies chest pain, chest pressure, palpitations. He is tired from his tele alarming all night, but otherwise feels well.     Labs Potassium3.9 (07/28 0516)   Creatinine, ser  1.20 (07/28 0516) PLT  106* (07/28 0516) HGB  14.6 (07/28 0516) WBC 6.1 (07/28 0516)  .    Past Medical History:  Diagnosis Date   Acute [facility]) 2009   STENTS PLACED   Chronic  kidney disease    Collagen vascular disease (HCC)    Hyperlipidemia    Hypertension    Pre-diabetes    Prediabetes      Surgical History:  Past Surgical History:  Procedure Laterality Date   ANGIOPLASTY     CARDIAC CATHETERIZATION     INCISIONAL HERNIA REPAIR N/A 05/02/2022   Procedure: LAPAROSCOPIC INCISIONAL HERNIA REPAIR WITH MESH;  Surgeon: Samuel Berg, Lawson;  Location: [facility];  Service: General;  Laterality: N/A;   ROBOTIC ASSITED PARTIAL NEPHRECTOMY Left 12/05/2021   Procedure: XI ROBOTIC ASSITED PARTIAL NEPHRECTOMY;  Surgeon: Samuel Morene ORN, Lawson;  Location: [facility];  Service: Urology;  Laterality: Left;  3.5 HRS     (Not in a hospital admission)   Inpatient Medications:   aspirin  EC  81 mg Oral QHS   atorvastatin   40 mg Oral QHS   buPROPion   150 mg Oral BID   heparin   5,000 Units Subcutaneous Q8H   icosapent  Ethyl  1 g Oral BID   losartan   25 mg Oral Daily   tamsulosin   0.4 mg Oral BID    Allergies: No Known Allergies  Family History  Problem Relation Age of Onset   Heart Problems Mother    Stroke Father    Alzheimer's disease Father    High blood pressure Father    Colon cancer Neg Hx    Colon polyps Neg Hx    Esophageal cancer Neg Hx    Rectal cancer  Neg Hx    Stomach cancer Neg Hx      Physical Exam: Vitals:   04/05/24 0400 04/05/24 0500 04/05/24 0800 04/05/24 0842  BP: (!) 101/42 96/72 96/62    Pulse: (!) 44 (!) 46 (!) 41   Resp: 20 19 18    Temp:    (!) 97.5 F (36.4 C)  TempSrc:    Oral  SpO2: 94% 96% 95%   Weight:      Height:       GEN- elderly male laying in bed NAD, A&O x 3, normal affect HEENT: Normocephalic, atraumatic Lungs- CTAB, Normal effort.  Heart- irregular rate and rhythm, No M/G/R.  GI- Soft, NT, ND.  Extremities- No clubbing, cyanosis, or edema   Radiology/Studies: ECHOCARDIOGRAM COMPLETE Result Date: 03/31/2024    ECHOCARDIOGRAM REPORT   Patient Name:   Samuel Lawson Date of Exam: 03/31/2024 Medical Rec #:   [id number]       Height:       68.0 in Accession #:    [id number]      Weight:       180.0 lb Date of Birth:  Jan 14, 1946       BSA:          1.954 m Patient Age:    78 years        BP:           127/77 mmHg Patient Gender: M               HR:           45 bpm. Exam Location:  Inpatient Procedure: 2D Echo, Cardiac Doppler, Color Doppler and Intracardiac            Opacification Agent (Both Spectral and Color Flow Doppler were            utilized during procedure). Indications:    Heart block, Complete I44.2  History:        Patient has prior history of Echocardiogram examinations, most                 recent 07/03/2015. CAD, Arrythmias:Bradycardia,                 Signs/Symptoms:Hypotension and Syncope; Risk                 Factors:Hypertension and Dyslipidemia.  Sonographer:    Thea Norlander RCS Referring Phys: [id number] ZANE ADAMS IMPRESSIONS  1. Left ventricular ejection fraction, by estimation, is 55 to 60%. The left ventricle has normal function. The left ventricle has no regional wall motion abnormalities. Left ventricular diastolic function could not be evaluated.  2. Right ventricular systolic function is normal. The right ventricular size is normal.  3. The mitral valve is normal in structure. No evidence of mitral valve regurgitation. No evidence of mitral stenosis.  4. The aortic valve is normal in structure. Aortic valve regurgitation is not visualized. No aortic stenosis is present.  5. The inferior vena cava is dilated in size with >50% respiratory variability, suggesting right atrial pressure of 8 mmHg. FINDINGS  Left Ventricle: Left ventricular ejection fraction, by estimation, is 55 to 60%. The left ventricle has normal function. The left ventricle has no regional wall motion abnormalities. Definity  contrast agent was given IV to delineate the left ventricular  endocardial borders. The left ventricular internal cavity size was normal in size. There is no left ventricular hypertrophy. Left ventricular  diastolic function could not be evaluated due to nondiagnostic images. Left ventricular diastolic  function could not be evaluated. Right Ventricle: The right ventricular size is normal. No increase in right ventricular wall thickness. Right ventricular systolic function is normal. Left Atrium: Left atrial size was normal in size. Right Atrium: Right atrial size was normal in size. Pericardium: There is no evidence of pericardial effusion. Mitral Valve: The mitral valve is normal in structure. No evidence of mitral valve regurgitation. No evidence of mitral valve stenosis. Tricuspid Valve: The tricuspid valve is normal in structure. Tricuspid valve regurgitation is mild . No evidence of tricuspid stenosis. Aortic Valve: The aortic valve is normal in structure. Aortic valve regurgitation is not visualized. No aortic stenosis is present. Aortic valve peak gradient measures 11.3 mmHg. Pulmonic Valve: The pulmonic valve was normal in structure. Pulmonic valve regurgitation is not visualized. No evidence of pulmonic stenosis. Aorta: The aortic root is normal in size and structure. Venous: The inferior vena cava is dilated in size with greater than 50% respiratory variability, suggesting right atrial pressure of 8 mmHg. IAS/Shunts: No atrial level shunt detected by color flow Doppler.  LEFT VENTRICLE PLAX 2D LVIDd:         5.20 cm   Diastology LVIDs:         3.97 cm   LV e' medial:    11.70 cm/s LV PW:         0.80 cm   LV E/e' medial:  8.2 LV IVS:        1.00 cm   LV e' lateral:   15.10 cm/s LVOT diam:     2.40 cm   LV E/e' lateral: 6.3 LV SV:         155 LV SV Index:   79 LVOT Area:     4.52 cm  RIGHT VENTRICLE             IVC RV S prime:     14.30 cm/s  IVC diam: 2.20 cm TAPSE (M-mode): 2.4 cm LEFT ATRIUM             Index        RIGHT ATRIUM           Index LA diam:        3.30 cm 1.69 cm/m   RA Area:     19.30 cm LA Vol (A2C):   56.7 ml 29.01 ml/m  RA Volume:   52.20 ml  26.71 ml/m LA Vol (A4C):   54.7 ml 27.99  ml/m LA Biplane Vol: 56.2 ml 28.76 ml/m  AORTIC VALVE AV Area (Vmax): 3.66 cm AV Vmax:        168.00 cm/s AV Peak Grad:   11.3 mmHg LVOT Vmax:      136.00 cm/s LVOT Vmean:     86.100 cm/s LVOT VTI:       0.342 m  AORTA Ao Root diam: 3.90 cm Ao Asc diam:  3.70 cm MITRAL VALVE MV Area (PHT): 2.61 cm    SHUNTS MV Decel Time: 291 msec    Systemic VTI:  0.34 m MV E velocity: 95.85 cm/s  Systemic Diam: 2.40 cm MV A velocity: 75.55 cm/s MV E/A ratio:  1.27 Morene Brownie Electronically signed by Morene Brownie Signature Date/Time: 03/31/2024/5:01:10 PM    Final    DG Chest Portable 1 View Result Date: 03/30/2024 CLINICAL DATA:  Weakness. EXAM: PORTABLE CHEST 1 VIEW COMPARISON:  March 17, 2024. FINDINGS: The heart size and mediastinal contours are within normal limits. Both lungs are clear. The visualized skeletal structures are unremarkable. IMPRESSION:  No active disease. Electronically Signed   By: Lynwood Landy Raddle M.D.   On: 03/30/2024 13:38   DG Chest 2 View Result Date: 03/18/2024 CLINICAL DATA:  Malignant neoplasm of left kidney. EXAM: CHEST - 2 VIEW COMPARISON:  Chest radiograph 09/13/2023 FINDINGS: The cardiomediastinal contours are normal. Chronic eventration of right hemidiaphragm. Mild diffuse bronchial thickening. Pulmonary vasculature is normal. No consolidation, pleural effusion, or pneumothorax. No evidence of pulmonary nodule. Thoracic spondylosis. No acute osseous abnormalities are seen. IMPRESSION: Mild diffuse bronchial thickening. Electronically Signed   By: Andrea Gasman M.D.   On: 03/18/2024 18:38    EKG: wenkebach at 54bpm  (personally reviewed)  TELEMETRY: wenkebach with rates 30s-60s (personally reviewed)  DEVICE HISTORY: none  Assessment/Plan: #) nocturnal CHB #) mobitz type 1 #) bradycardia  Patient presented to ER after [employer] notification for frequent episodes of bradycardia during the day. Patient has several symptom alerts on his Zio monitor, but it seems [employer] had  been instructing him to press button and he was asymptomatic during these events  Unclear cause of patient's unsteadiness and feeling off Orthostatics negative Nursing walked patient around unit and patient did become dizzy  He meets criteria for PPM implant, pending lab availability OK to eat today         For questions or updates, please contact [facility] Please consult [email or web address] for contact info under        For questions or updates, please contact [facility] Please consult [email or web address] for contact info under Cardiology/STEMI.  Signed, Tyress Loden, NP  04/05/2024 9:14 AM

## 2024-04-06 ENCOUNTER — Encounter (HOSPITAL_COMMUNITY)
Admission: EM | Disposition: A | Discharge status: Home / Self Care | Payer: Self-pay | Source: Home / Self Care | Attending: Internal Medicine

## 2024-04-06 ENCOUNTER — Other Ambulatory Visit: Payer: Self-pay

## 2024-04-06 DIAGNOSIS — I441 Atrioventricular block, second degree: Secondary | ICD-10-CM

## 2024-04-06 HISTORY — PX: PACEMAKER IMPLANT: EP1218

## 2024-04-06 LAB — SURGICAL PCR SCREEN
MRSA, PCR: NEGATIVE
Staphylococcus aureus: NEGATIVE

## 2024-04-06 SURGERY — PACEMAKER IMPLANT

## 2024-04-06 MED ORDER — SODIUM CHLORIDE 0.9 % IV SOLN: INTRAVENOUS | Status: DC

## 2024-04-06 MED ORDER — MIDAZOLAM HCL 5 MG/5ML IJ SOLN
INTRAMUSCULAR | Status: AC
Start: 2024-04-06 — End: 2024-04-06
  Filled 2024-04-06: qty 5

## 2024-04-06 MED ORDER — SODIUM CHLORIDE 0.9 % IV SOLN
INTRAVENOUS | Status: AC
  Filled 2024-04-06: qty 2

## 2024-04-06 MED ORDER — HEPARIN (PORCINE) IN NACL 1000-0.9 UT/500ML-% IV SOLN
INTRAVENOUS | Status: DC | PRN
  Administered 2024-04-06: 500 mL

## 2024-04-06 MED ORDER — SODIUM CHLORIDE 0.9 % IV SOLN: 250.0000 mL | INTRAVENOUS | Status: DC

## 2024-04-06 MED ORDER — CHLORHEXIDINE GLUCONATE 4 % EX SOLN
60.0000 mL | Freq: Once | CUTANEOUS | Status: AC
  Administered 2024-04-06: 4 via TOPICAL
  Filled 2024-04-06: qty 15

## 2024-04-06 MED ORDER — SODIUM CHLORIDE 0.9% FLUSH: 3.0000 mL | INTRAVENOUS | Status: DC | PRN

## 2024-04-06 MED ORDER — MIDAZOLAM HCL 5 MG/5ML IJ SOLN
INTRAMUSCULAR | Status: DC | PRN
  Administered 2024-04-06: 1 mg via INTRAVENOUS

## 2024-04-06 MED ORDER — SODIUM CHLORIDE 0.9 % IV SOLN
80.0000 mg | INTRAVENOUS | Status: AC
  Administered 2024-04-06: 80 mg
  Filled 2024-04-06: qty 2

## 2024-04-06 MED ORDER — LIDOCAINE HCL 1 % IJ SOLN
INTRAMUSCULAR | Status: AC
  Filled 2024-04-06: qty 60

## 2024-04-06 MED ORDER — FENTANYL CITRATE (PF) 100 MCG/2ML IJ SOLN
INTRAMUSCULAR | Status: DC | PRN
  Administered 2024-04-06: 25 ug via INTRAVENOUS

## 2024-04-06 MED ORDER — FENTANYL CITRATE (PF) 100 MCG/2ML IJ SOLN
INTRAMUSCULAR | Status: AC
Start: 2024-04-06 — End: 2024-04-06
  Filled 2024-04-06: qty 2

## 2024-04-06 MED ORDER — CEFAZOLIN SODIUM-DEXTROSE 2-4 GM/100ML-% IV SOLN
INTRAVENOUS | Status: AC
  Filled 2024-04-06: qty 100

## 2024-04-06 MED ORDER — SODIUM CHLORIDE 0.9% FLUSH
3.0000 mL | Freq: Two times a day (BID) | INTRAVENOUS | Status: DC
  Administered 2024-04-06: 3 mL via INTRAVENOUS

## 2024-04-06 MED ORDER — CEFAZOLIN SODIUM-DEXTROSE 2-4 GM/100ML-% IV SOLN
2.0000 g | INTRAVENOUS | Status: AC
  Administered 2024-04-06: 2 g via INTRAVENOUS
  Filled 2024-04-06: qty 100

## 2024-04-06 MED ORDER — CHLORHEXIDINE GLUCONATE 4 % EX SOLN: 60.0000 mL | Freq: Once | CUTANEOUS | Status: AC

## 2024-04-06 MED ORDER — LIDOCAINE HCL (PF) 1 % IJ SOLN
INTRAMUSCULAR | Status: DC | PRN
  Administered 2024-04-06: 60 mL

## 2024-04-06 SURGICAL SUPPLY — 11 items
CABLE SURGICAL S-101-97-12 (CABLE) ×1 IMPLANT
CATH RIGHTSITE C315HIS02 (CATHETERS) IMPLANT
IPG PACE AZUR XT DR MRI W1DR01 (Pacemaker) IMPLANT
LEAD CAPSURE NOVUS 5076-52CM (Lead) IMPLANT
LEAD SELECT SECURE 3830 383069 (Lead) IMPLANT
PAD DEFIB RADIO PHYSIO CONN (PAD) ×1 IMPLANT
SHEATH 7FR PRELUDE SNAP 13 (SHEATH) IMPLANT
SHEATH PROBE COVER 6X72 (BAG) IMPLANT
SLITTER 6232ADJ (MISCELLANEOUS) IMPLANT
TRAY PACEMAKER INSERTION (PACKS) ×1 IMPLANT
WIRE HI TORQ VERSACORE-J 145CM (WIRE) IMPLANT

## 2024-04-06 NOTE — TOC Initial Note (Signed)
 Transition of Care [facility]) - Initial/Assessment Note    Patient Details  Name: Samuel Lawson MRN: [id number] Date of Birth: November 09, 1945  Transition of Care [facility]) CM/SW Contact:    Sudie Erminio Deems, RN Phone Number: 04/06/2024, 3:13 PM  Clinical Narrative:   Patient presented for bradycardia. Cardiology is following-plan is  for pacemaker implant today. PTA patient reports that he is from home alone. Patient states he has support of a friend that helps as needed. Patient states he still drives to all appointments. PCP is [facility] [place] and the next appointment is April 13, 2024 @ 9:15 am. Case Manager will continue to follow for disposition needs as the patient progresses.         Expected Discharge Plan: Home/Self Care Barriers to Discharge: Continued Medical Work up   Patient Goals and CMS Choice Patient states their goals for this hospitalization and ongoing recovery are:: plans to return home once stable   Choice offered to / list presented to : NA      Expected Discharge Plan and Services In-house Referral: NA Discharge Planning Services: CM Consult Post Acute Care Choice: NA Living arrangements for the past 2 months: Single Family Home                   DME Agency: NA       HH Arranged: NA          Prior Living Arrangements/Services Living arrangements for the past 2 months: Single Family Home Lives with:: Self (states he has a friend that proivdes support) Patient language and need for interpreter reviewed:: Yes Do you feel safe going back to the place where you live?: Yes      Need for Family Participation in Patient Care: No (Comment) Care giver support system in place?: No (comment)   Criminal Activity/Legal Involvement Pertinent to Current Situation/Hospitalization: No - Comment as needed  Activities of Daily Living   ADL Screening (condition at time of admission) Independently performs ADLs?: Yes (appropriate for developmental age) Is the  patient deaf or have difficulty hearing?: Yes Does the patient have difficulty seeing, even when wearing glasses/contacts?: No Does the patient have difficulty concentrating, remembering, or making decisions?: No  Permission Sought/Granted Permission sought to share information with : Family Supports, Case Manager                Emotional Assessment Appearance:: Appears stated age Attitude/Demeanor/Rapport: Engaged Affect (typically observed): Appropriate Orientation: : Oriented to Self, Oriented to  Time, Oriented to Situation, Oriented to Place Alcohol / Substance Use: Not Applicable Psych Involvement: No (comment)  Admission diagnosis:  Bradycardia [R00.1] Heart block [I45.9] Symptomatic bradycardia [R00.1] Patient Active Problem List   Diagnosis Date Noted   Heart block 04/05/2024   Bradycardia 04/04/2024   Hypercholesteremia 03/31/2024   Symptomatic bradycardia 03/30/2024   Heart block AV complete (HCC) 03/30/2024   Near syncope 03/30/2024   Anxiety 05/05/2022   Prediabetes 05/05/2022   Collagen vascular disease (HCC) 05/05/2022   Incisional hernia 05/02/2022   Memory loss 01/02/2022   Renal cell cancer, left, 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 12/05/2021   Upper respiratory tract infection 10/31/2021   Thrombocytopenia (HCC) 01/07/2020   Eustachian tube dysfunction, bilateral 11/17/2019   Chronic swimmer's ear of both sides 03/19/2018   Impacted cerumen of both ears 03/19/2018   Sensorineural hearing loss (SNHL), bilateral 06/26/2017   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   Left medial knee pain 01/22/2017   Left arm pain 11/19/2016  Dyshidrotic hand dermatitis 01/22/2016   BPH (benign prostatic hyperplasia) 07/19/2015   Nicotine  dependence 06/22/2015   Orthostatic hypotension 12/14/2014   Dizziness 11/29/2014   Gastroesophageal reflux disease without esophagitis 03/09/2014   Essential hypertension 03/09/2014   Coronary artery  disease involving native coronary artery of native heart without angina pectoris 08/25/2013   Hyperlipidemia LDL goal <100 08/25/2013   Obesity (BMI 30-39.9) 08/25/2013   Disequilibrium 08/25/2013   PCP:  Theotis Haze ORN, NP Pharmacy:   [facility] [address], [address] [place] [address] Phone: [phone] Fax: [phone]  [facility] - [place], [place] - [address] [place] [address] Phone: [phone] Fax: [phone]     Social Drivers of Health (SDOH) Social History: SDOH Screenings   Food Insecurity: No Food Insecurity (04/05/2024)  Housing: Low Risk  (04/05/2024)  Transportation Needs: No Transportation Needs (04/05/2024)  Utilities: At Risk (04/05/2024)  Alcohol Screen: Low Risk  (04/22/2023)  Depression (PHQ2-9): Medium Risk (03/30/2024)  Financial Resource Strain: Low Risk  (04/22/2023)  Physical Activity: Insufficiently Active (04/22/2023)  Social Connections: Moderately Isolated (04/05/2024)  Stress: No Stress Concern Present (07/04/2023)  Tobacco Use: High Risk (04/04/2024)  Health Literacy: Adequate Health Literacy (04/22/2023)   SDOH Interventions:     Readmission Risk Interventions    05/06/2022   10:25 AM  Readmission Risk Prevention Plan  Post Dischage Appt Complete  Medication Screening Complete  Transportation Screening Complete

## 2024-04-06 NOTE — Progress Notes (Signed)
 Rounding Note   Patient Name: Samuel Lawson Date of Encounter: 04/06/2024  [facility] Cardiologist: Gordy Bergamo, MD   Subjective Pt denies CP  Breathing is OK    Notes occasional dizziness at home with standing   Some days he does just fine though   Scheduled Meds:  aspirin  EC  81 mg Oral QHS   atorvastatin   40 mg Oral QHS   buPROPion   150 mg Oral BID   heparin   5,000 Units Subcutaneous Q8H   icosapent  Ethyl  1 g Oral BID   tamsulosin   0.4 mg Oral BID   Continuous Infusions:  PRN Meds: acetaminophen , nitroGLYCERIN , ondansetron  (ZOFRAN ) IV   Vital Signs  Vitals:   04/05/24 2147 04/06/24 0345 04/06/24 0545 04/06/24 0834  BP: 104/60 93/66 96/64  115/65  Pulse:  62  (!) 51  Resp:  16 18 18   Temp:  97.7 F (36.5 C) 98 F (36.7 C) 97.7 F (36.5 C)  TempSrc:  Oral  Oral  SpO2:  98% 99% 95%  Weight:      Height:        Intake/Output Summary (Last 24 hours) at 04/06/2024 1012 Last data filed at 04/06/2024 0348 Gross per 24 hour  Intake --  Output 600 ml  Net -600 ml      04/05/2024    3:08 PM 04/04/2024   11:43 AM 03/30/2024   12:04 PM  Last 3 Weights  Weight (lbs) 179 lb 8 oz 179 lb 14.3 oz 180 lb  Weight (kg) 81.421 kg 81.6 kg 81.647 kg      Telemetry SB, SR Type I second degree AV block    Rates 30s to 60s    No significant pauses - Personally Reviewed  ECG   - Personally Reviewed  Physical Exam  GEN: No acute distress.   Neck: No JVD Cardiac: RRR, no murmur Respiratory: Clear to auscultation GI: Soft   ventral hernia  Ext : No edema;   Labs High Sensitivity Troponin:  No results for input(s): TROPONINIHS in the last 720 hours.   Chemistry Recent Labs  Lab 03/30/24 1221 03/31/24 0241 04/04/24 1154 04/05/24 0516 04/05/24 1821  NA 140 141 139 139  --   K 4.4 3.6 4.1 3.9  --   CL 106 110 110 106  --   CO2 23 23 20* 23  --   GLUCOSE 114* 99 120* 105*  --   BUN 14 12 17 17   --   CREATININE 1.51* 1.03 1.13 1.20 1.25*  CALCIUM  9.1  8.3* 9.0 9.0  --   MG 2.5*  --   --   --   --   PROT 7.7  --  6.6  --   --   ALBUMIN 4.4  --  3.7  --   --   AST 22  --  16  --   --   ALT 11  --  13  --   --   ALKPHOS 65  --  50  --   --   BILITOT 1.6*  --  1.2  --   --   GFRNONAA 48* >60 >60 >60 60*  ANIONGAP 11 8 9 10   --     Lipids  Recent Labs  Lab 03/30/24 1221  CHOL 120  TRIG 72  HDL 62  LDLCALC 44  CHOLHDL 1.9    Hematology Recent Labs  Lab 04/04/24 1154 04/05/24 0516 04/05/24 1821  WBC 6.2 6.1 5.8  RBC 4.84 4.59 4.62  HGB 15.4 14.6 14.5  HCT 47.6 44.1 44.2  MCV 98.3 96.1 95.7  MCH 31.8 31.8 31.4  MCHC 32.4 33.1 32.8  RDW 12.7 12.7 12.8  PLT 102* 106* 109*   Thyroid   Recent Labs  Lab 03/30/24 1400  TSH 2.874    BNPNo results for input(s): BNP, PROBNP in the last 168 hours.  DDimer No results for input(s): DDIMER in the last 168 hours.   Radiology  No results found.  Cardiac Studies   Patient Profile   78 y.o. male   Assessment & Plan   1  Bradycardia   Pt with hx of bradycardia   carvedilol  held at recent hospitalization  Zio placed   Pt called for nocturnal CHB     Notifed again of daytime bradycardia    Pt told to go t ER.  Pt off of carvedilol   EP has seen  (see notes)  WIth conduction system dz, feel pt would benefit from PPM     2  Dizzienss   Intermittent   Not clearly associated with low HR (Pt did not trigger)     Pt not orthostatic here      May or may not be related to low HR   Will need to follow    2  CAD   Pt with stent to RCA in 2001,  stent to occluded RCA in 2009  3  Hx HTN   BP has been low normal to low here Losartan  on hold    Follow   4  HL    Keep on lipitor    For questions or updates, please contact  [facility] Please consult [email or web address] for contact info under     Signed, Vina Gull, MD  04/06/2024, 10:12 AM

## 2024-04-06 NOTE — Plan of Care (Signed)
  Problem: Education: Goal: Understanding of cardiac disease, CV risk reduction, and recovery process will improve Outcome: Progressing   Problem: Activity: Goal: Ability to tolerate increased activity will improve Outcome: Progressing   Problem: Cardiac: Goal: Ability to achieve and maintain adequate cardiovascular perfusion will improve Outcome: Progressing   Problem: Health Behavior/Discharge Planning: Goal: Ability to manage health-related needs will improve Outcome: Progressing   Problem: Clinical Measurements: Goal: Will remain free from infection Outcome: Progressing Goal: Cardiovascular complication will be avoided Outcome: Progressing   Problem: Activity: Goal: Risk for activity intolerance will decrease Outcome: Progressing   Problem: Nutrition: Goal: Adequate nutrition will be maintained Outcome: Progressing   Problem: Coping: Goal: Level of anxiety will decrease Outcome: Progressing   Problem: Elimination: Goal: Will not experience complications related to bowel motility Outcome: Progressing   Problem: Safety: Goal: Ability to remain free from injury will improve Outcome: Progressing   Problem: Skin Integrity: Goal: Risk for impaired skin integrity will decrease Outcome: Progressing

## 2024-04-06 NOTE — Progress Notes (Addendum)
  Patient Name: Samuel Lawson Date of Encounter: 04/06/2024  Primary Cardiologist: Gordy Bergamo, MD Electrophysiologist: None  Interval Summary   NAEON. Continues to be asymptomatic. Resting in bed comfortably. Denies chest pain, chest pressure, SOB.   He is frustrated at not knowing definite procedure date/time  Vital Signs    Vitals:   04/05/24 2147 04/06/24 0345 04/06/24 0545 04/06/24 0834  BP: 104/60 93/66 96/64  115/65  Pulse:  62  (!) 51  Resp:  16 18 18   Temp:  97.7 F (36.5 C) 98 F (36.7 C) 97.7 F (36.5 C)  TempSrc:  Oral  Oral  SpO2:  98% 99% 95%  Weight:      Height:        Intake/Output Summary (Last 24 hours) at 04/06/2024 0909 Last data filed at 04/06/2024 0348 Gross per 24 hour  Intake --  Output 600 ml  Net -600 ml   Filed Weights   04/04/24 1143 04/05/24 1508  Weight: 81.6 kg (P) 81.4 kg    Physical Exam    GEN- NAD, Alert and oriented  Lungs- Clear to ausculation bilaterally, normal work of breathing Cardiac- Regularly irregular rate and rhythm, no murmurs, rubs or gallops GI- soft, NT, ND, + BS Extremities- no clubbing or cyanosis. No edema  Telemetry    Wenkebach with intermittent 2:1, rates 40-60s (personally reviewed)  Hospital Course    Samuel Lawson is a 78 y.o. male with PMH of  bradycardia HTN, HLD, CAD s/p PCI, tobacco use admitted for high grade AV block  Assessment & Plan    #) 2:1 AV block #) mobitz type 1 #) bradycardia  Continues to have advanced conduction disease with intermittent 2:1 AV block and wenekebach with dizziness with walking He meets criteria for PPM implant He is NPO for possible procedure today, ok to have clear fluid this AM       For questions or updates, please contact Samuel Lawson Please consult [email or web address] for contact info under     Signed, Samuel Riddle, NP  04/06/2024, 9:09 AM    I have seen and examined this patient with Samuel Lawson.  Agree with above, note added to  reflect my findings.  Patient feeling well.  Continues to complain of dizziness.  GEN: No acute distress.   Neck: No JVD Cardiac: RRR, no murmurs, rubs, or gallops.  Respiratory: normal BS bases bilaterally. GI: Soft, nontender, non-distended  MS: No edema; No deformity. Neuro:  Nonfocal  Skin: warm and dry Psych: Normal affect    Second-degree 2-1 AV block: Continues to have episodes of bradycardia intermixed with Mobitz 1 heart block and sinus rhythm.  He has dizziness, but this appears to not necessarily be related to his heart block as dizziness appears more related to vertigo.  Despite this, he does have significant conduction system disease and would benefit from a pacemaker implant.  Risks and benefits have been discussed.  He understands the risks and is agreed to the procedure. Explained risks, benefits, and alternatives to PPM implantation, including but not limited to bleeding, infection, pneumothorax, pericardial effusion, lead dislodgement, heart attack, stroke, or death.  Pt verbalized understanding and agrees to proceed.   Samuel Lawson M. Samuel Michelin MD 04/06/2024 1:17 PM

## 2024-04-06 NOTE — Progress Notes (Signed)
 Pt has had intermittent episodes of 3rd degree HB throughout this shift w/HR dropping as low as 27 non sustained. Pt remains largely asymptomatic and has voiced no complaints. Pt has been NPO for possible PPM insertion.

## 2024-04-07 ENCOUNTER — Other Ambulatory Visit: Payer: Self-pay

## 2024-04-07 ENCOUNTER — Encounter (HOSPITAL_COMMUNITY): Payer: Self-pay | Admitting: Cardiology

## 2024-04-07 ENCOUNTER — Other Ambulatory Visit (HOSPITAL_COMMUNITY): Payer: Self-pay

## 2024-04-07 ENCOUNTER — Inpatient Hospital Stay (HOSPITAL_COMMUNITY)

## 2024-04-07 DIAGNOSIS — Z950 Presence of cardiac pacemaker: Secondary | ICD-10-CM | POA: Diagnosis not present

## 2024-04-07 MED ORDER — MENTHOL 3 MG MT LOZG
1.0000 | LOZENGE | OROMUCOSAL | Status: DC | PRN
  Filled 2024-04-07: qty 9

## 2024-04-07 NOTE — Discharge Summary (Addendum)
 ELECTROPHYSIOLOGY PROCEDURE DISCHARGE SUMMARY    Patient ID: Samuel Lawson,  MRN: [id number], DOB/AGE: 11-28-1945 78 y.o.  Admit date: 04/04/2024 Discharge date: 04/07/2024  Primary Care Physician: Theotis Haze ORN, NP  Primary Cardiologist: Gordy Bergamo, MD  Electrophysiologist: Dr. Inocencio   Primary Discharge Diagnosis:  2:1 AV block  Secondary Discharge Diagnosis:  Mobitz type 1 Symptmoatic bradycardia   No Known Allergies   Procedures This Admission:  1.  Implantation of a Medtronic Dual Chamber PPM on 04/06/2024 by Dr. Inocencio. The patient received a Medtronic Azure XT DR [id number]  with a Medtronic CapSureFix Novus 5076 right atrial lead and a Medtronic SelectSure 3830 right ventricular lead.  There were no immediate post procedure complications.   2.  CXR on 04/07/2024 demonstrated no pneumothorax status post device implantation.       Brief HPI: Samuel Lawson is a 78 y.o. male was admitted for dizziness, LH and electrophysiology team asked to see for consideration of PPM implantation.  Past medical history includes bradycardia, HTN, HLD, CAD s/p PCI, tobacco use.  The patient has had AV block without reversible causes identified.  Risks, benefits, and alternatives to PPM implantation were reviewed with the patient who wished to proceed.   Hospital Course:  The patient was admitted and underwent implantation of a Medtronic dual chamber PPM with details as outlined above.  He was monitored on telemetry overnight which demonstrated appropriate pacing.  Left chest was without hematoma or ecchymosis.  The device was interrogated and found to be functioning normally.  CXR was obtained and demonstrated no pneumothorax status post device implantation.  Wound care, arm mobility, and restrictions were reviewed with the patient.  The patient was examined and considered stable for discharge to home.    Anticoagulation resumption This patient is not on anticoagulation       Physical Exam: Vitals:   04/07/24 0046 04/07/24 0431 04/07/24 0500 04/07/24 0800  BP: 109/61   126/87  Pulse: 64   63  Resp: 16 16  17   Temp: 98.1 F (36.7 C) 98.1 F (36.7 C)  97.7 F (36.5 C)  TempSrc: Oral   Oral  SpO2: 93% 94% 92% 94%  Weight:      Height:        GEN- NAD. A&O x 3.  HEENT: Normocephalic, atraumatic Lungs- CTAB, Normal effort.  Heart- RRR, No M/G/R.  GI- Soft, NT, ND.  Extremities- No clubbing, cyanosis, or edema;  Skin- warm and dry, no rash or lesion, left chest without hematoma/ecchymosis. Outer bandage removed, steri-strips remain in place  Discharge Medications:  Allergies as of 04/07/2024   No Known Allergies      Medication List     TAKE these medications    Accu-Chek Guide Control Liqd 1 each by In Vitro route once as needed for up to 1 dose. R73.03   Accu-Chek Guide test strip Generic drug: glucose blood Use as instructed. Check blood glucose by fingerstick once per day. R73.03   albuterol  108 (90 Base) MCG/ACT inhaler Commonly known as: VENTOLIN  HFA Inhale 1-2 puffs into the lungs every 6 (six) hours as needed.   aspirin  EC 81 MG tablet Take 81 mg by mouth at bedtime. Swallow whole.   atorvastatin  40 MG tablet Commonly known as: LIPITOR Take 1 tablet (40 mg total) by mouth at bedtime.   buPROPion  150 MG 12 hr tablet Commonly known as: Wellbutrin  SR Take 1 tablet (150 mg total) by mouth 2 (two) times daily.   icosapent   Ethyl 1 g capsule Commonly known as: Vascepa  Take 1 capsule (1 g total) by mouth 2 (two) times daily. Please schedule appt with Zelda Fleming.   losartan  25 MG tablet Commonly known as: Cozaar  Take 1 tablet (25 mg total) by mouth daily.   multivitamin with minerals tablet Take 1 tablet by mouth daily.   tamsulosin  0.4 MG Caps capsule Commonly known as: FLOMAX  Take 2 capsules (0.8 mg total) by mouth daily. Please schedule appt with Zelda Fleming. What changed:  how much to take when to take this    Vitamin D3 125 MCG (5000 UT) Caps Take 5,000 Units by mouth daily. When compliant        Disposition:  Home with usual follow up as in AVS   Duration of Discharge Encounter:  APP time: 20 minutes  Signed, Suzann Riddle, NP  04/07/2024 11:39 AM   I have seen and examined this patient with Suzann Riddle.  Agree with above, note added to reflect my findings.  On exam, RRR, no murmurs.  She is now status post pacemaker for second degree AV block.  Device functioning appropriately.  Sensing, threshold, impedance within normal limits as expected post implant.  Chest x-ray and interrogation without issue.  Plan for discharge today with follow-up in device clinic.  Mashelle Busick M. Pearl Berlinger MD 04/07/2024 8:09 PM

## 2024-04-07 NOTE — Discharge Instructions (Signed)
 After Your Pacemaker   You have a Medtronic Pacemaker  ACTIVITY Do not lift your arm above shoulder height for 1 week after your procedure. After 7 days, you may progress as below.  You should remove your sling 24 hours after your procedure, unless otherwise instructed by your provider.     Wednesday April 14, 2024  Thursday April 15, 2024 Friday April 16, 2024 Saturday April 17, 2024   Do not lift, push, pull, or carry anything over 10 pounds with the affected arm until 6 weeks (Wednesday May 19, 2024 ) after your procedure.   You may drive AFTER your wound check, unless you have been told otherwise by your provider.   Ask your healthcare provider when you can go back to work   INCISION/Dressing If you are on a blood thinner such as Coumadin, Xarelto, Eliquis, Plavix , or Pradaxa please confirm with your provider when this should be resumed.   If large square, outer bandage is left in place, this can be removed after 24 hours from your procedure. Do not remove steri-strips or glue as below.   If a PRESSURE DRESSING (a bulky dressing that usually goes up over your shoulder) was applied or left in place, please follow instructions given by your provider on when to return to have this removed.   Monitor your Pacemaker site for redness, swelling, and drainage. Call the device clinic at [phone] if you experience these symptoms or fever/chills.  If your incision is sealed with Steri-strips or staples, you may shower 7 days after your procedure or when told by your provider. Do not remove the steri-strips or let the shower hit directly on your site. You may wash around your site with soap and water .    If you were discharged in a sling, please do not wear this during the day more than 48 hours after your surgery unless otherwise instructed. This may increase the risk of stiffness and soreness in your shoulder.   Avoid lotions, ointments, or perfumes over your incision until it is  well-healed.  You may use a hot tub or a pool AFTER your wound check appointment if the incision is completely closed.  Pacemaker Alerts:  Some alerts are vibratory and others beep. These are NOT emergencies. Please call our office to let us  know. If this occurs at night or on weekends, it can wait until the next business day. Send a remote transmission.  If your device is capable of reading fluid status (for heart failure), you will be offered monthly monitoring to review this with you.   DEVICE MANAGEMENT Remote monitoring is used to monitor your pacemaker from home. This monitoring is scheduled every 91 days by our office. It allows us  to keep an eye on the functioning of your device to ensure it is working properly. You will routinely see your Electrophysiologist annually (more often if necessary).   You should receive your ID card for your new device in 4-8 weeks. Keep this card with you at all times once received. Consider wearing a medical alert bracelet or necklace.  Your Pacemaker may be MRI compatible. This will be discussed at your next office visit/wound check.  You should avoid contact with strong electric or magnetic fields.   Do not use amateur (ham) radio equipment or electric (arc) welding torches. MP3 player headphones with magnets should not be used. Some devices are safe to use if held at least 12 inches (30 cm) from your Pacemaker. These include power tools, lawn  mowers, and speakers. If you are unsure if something is safe to use, ask your health care provider.  When using your cell phone, hold it to the ear that is on the opposite side from the Pacemaker. Do not leave your cell phone in a pocket over the Pacemaker.  You may safely use electric blankets, heating pads, computers, and microwave ovens.  Call the office right away if: You have chest pain. You feel more short of breath than you have felt before. You feel more light-headed than you have felt before. Your  incision starts to open up.  This information is not intended to replace advice given to you by your health care provider. Make sure you discuss any questions you have with your health care provider.

## 2024-04-07 NOTE — Progress Notes (Signed)
 Rounding Note   Patient Name: Sevan Mcbroom Date of Encounter: 04/07/2024  [facility] Cardiologist: Gordy Bergamo, MD   Subjective No CP  No SOB  Not out of bed yet    Scheduled Meds:  aspirin  EC  81 mg Oral QHS   atorvastatin   40 mg Oral QHS   buPROPion   150 mg Oral BID   heparin   5,000 Units Subcutaneous Q8H   icosapent  Ethyl  1 g Oral BID   tamsulosin   0.4 mg Oral BID   Continuous Infusions:  PRN Meds: acetaminophen , menthol -cetylpyridinium, nitroGLYCERIN , ondansetron  (ZOFRAN ) IV   Vital Signs  Vitals:   04/07/24 0046 04/07/24 0431 04/07/24 0500 04/07/24 0800  BP: 109/61   126/87  Pulse: 64   63  Resp: 16 16  17   Temp: 98.1 F (36.7 C) 98.1 F (36.7 C)  97.7 F (36.5 C)  TempSrc: Oral   Oral  SpO2: 93% 94% 92% 94%  Weight:      Height:        Intake/Output Summary (Last 24 hours) at 04/07/2024 0920 Last data filed at 04/07/2024 0316 Gross per 24 hour  Intake 1215.5 ml  Output --  Net 1215.5 ml      04/05/2024    3:08 PM 04/04/2024   11:43 AM 03/30/2024   12:04 PM  Last 3 Weights  Weight (lbs) 179 lb 8 oz 179 lb 14.3 oz 180 lb  Weight (kg) 81.421 kg 81.6 kg 81.647 kg      Telemetry Paced  - Personally Reviewed  ECG   - Personally Reviewed  Physical Exam  GEN: No acute distress.   Neck: No JVD Cardiac: RRR, no murmurs, Chest   Pacer site clean, dry   Abdomen  Soft  Respiratory: Clear to auscultation GI: Soft, nontender, non-distended  MS: No edema;   Labs High Sensitivity Troponin:  No results for input(s): TROPONINIHS in the last 720 hours.   Chemistry Recent Labs  Lab 04/04/24 1154 04/05/24 0516 04/05/24 1821  NA 139 139  --   K 4.1 3.9  --   CL 110 106  --   CO2 20* 23  --   GLUCOSE 120* 105*  --   BUN 17 17  --   CREATININE 1.13 1.20 1.25*  CALCIUM  9.0 9.0  --   PROT 6.6  --   --   ALBUMIN 3.7  --   --   AST 16  --   --   ALT 13  --   --   ALKPHOS 50  --   --   BILITOT 1.2  --   --   GFRNONAA >60 >60 60*   ANIONGAP 9 10  --     Lipids  No results for input(s): CHOL, TRIG, HDL, LABVLDL, LDLCALC, CHOLHDL in the last 168 hours.   Hematology Recent Labs  Lab 04/04/24 1154 04/05/24 0516 04/05/24 1821  WBC 6.2 6.1 5.8  RBC 4.84 4.59 4.62  HGB 15.4 14.6 14.5  HCT 47.6 44.1 44.2  MCV 98.3 96.1 95.7  MCH 31.8 31.8 31.4  MCHC 32.4 33.1 32.8  RDW 12.7 12.7 12.8  PLT 102* 106* 109*   Thyroid   No results for input(s): TSH, FREET4 in the last 168 hours.   BNPNo results for input(s): BNP, PROBNP in the last 168 hours.  DDimer No results for input(s): DDIMER in the last 168 hours.   Radiology  DG Chest 2 View Result Date: 04/07/2024 EXAM: 2 VIEW(S) XRAY OF THE CHEST  04/07/2024 06:17:00 AM COMPARISON: 03/30/2024. Stable cardiomediastinal silhouette. CLINICAL HISTORY: [id number] Cardiac device in situ, other [phone]. Encounter for cardiac device in situ other Cardiac device in situ, other [phone]. Encounter for cardiac device in situ other FINDINGS: LUNGS AND PLEURA: No focal consolidation, pleural effusion, or pneumothorax. HEART AND MEDIASTINUM: Stable cardiomediastinal silhouette. Left chest wall pacemaker is new since prior radiograph. Leads project over the right atrium and right ventricle. BONES AND SOFT TISSUES: No acute osseous abnormality. IMPRESSION: 1. New left chest wall pacemaker since prior radiograph, with leads projecting over the right atrium and right ventricle. 2. No pneumothorax. Electronically signed by: Norman Gatlin MD 04/07/2024 09:01 AM EDT RP Workstation: [id number]   EP PPM/ICD IMPLANT Result Date: 04/06/2024 SURGEON:  Soyla Norton, MD   PREPROCEDURE DIAGNOSIS:  second degree AV block   POSTPROCEDURE DIAGNOSIS:  second degree AV block    PROCEDURES:  1. Pacemaker implantation.   INTRODUCTION:  Desmond Szabo is a 78 y.o. male with a history of bradycardia who presents today for pacemaker implantation.  The patient reports intermittent episodes of dizziness  over the past few months.  No reversible causes have been identified.  The patient therefore presents today for pacemaker implantation.   DESCRIPTION OF PROCEDURE:  Informed written consent was obtained, and  the patient was brought to the electrophysiology lab in a fasting state.  The patient required no sedation for the procedure today.  The patients left chest was prepped and draped in the usual sterile fashion by the EP lab staff. The skin overlying the left deltopectoral region was infiltrated with lidocaine  for local analgesia.  A 4-cm incision was made over the left deltopectoral region.  A left subcutaneous pacemaker pocket was fashioned using a combination of sharp and blunt dissection. Electrocautery was required to assure hemostasis.  RA/RV Lead Placement: The left axillary vein was therefore cannulated.  Through the left axillary vein, a Medtronic CapSureFix Novus 5076  (serial number  [id number]) right atrial lead and an Medtronic SelectSure 3830 (serial number  [id number]) right ventricular lead were advanced with fluoroscopic visualization into the right atrial appendage and left bundle area positions respectively.  Initial atrial lead P- waves measured 1.8 mV with impedance of 588 ohms and a threshold of 0.8 V at 0.5 msec.  Right ventricular lead R-waves measured 6 mV with an impedance of 925 ohms and a threshold of 0.8 V at 0.5 msec.  Both leads were secured to the pectoralis fascia using #2-0 silk over the suture sleeves. Device Placement:  The leads were then connected to an Medtronic Azure XT DR [id number]   (serial number  [id number] ) pacemaker.  The pocket was irrigated with copious gentamicin  solution.  The pacemaker was then placed into the pocket.  The pocket was then closed in 3 layers with 2.0 and 3.0 V-Loc suture for the subcutaneous layers and 3.0 Vicryl suture for the subcuticular layers.  Steri-  Strips and a sterile dressing were then applied. EBL<74ml.  There were no early apparent  complications.   CONCLUSIONS:  1. Successful implantation of a Medtronic Azure XT DR [id number]  dual-chamber pacemaker for symptomatic bradycardia  2. No early apparent complications.   Will Norton, MD 04/06/2024 5:33 PM   Cardiac Studies   Patient Profile   78 y.o. male   Assessment & Plan   1  Bradycardia   Pt with hx of bradycardia   carvedilol  held at recent hospitalization  Zio placed   Pt called for nocturnal CHB  Notifed again of daytime bradycardia    Pt told to go t ER.    Here in ER no signficant pauses or periods of profound bradycardia Off of carvedilol  for a few days    Pt underwent PPM yesterday  (Medtronic Azure dual chamber pacer)  Plan to d/c today with follow up         2  Dizzienss   Intermittent   Not clearly associated with low HR (Pt did not trigger)     Pt not orthostatic here      Losartan  stopped  Will follow now that pt has PPM I have instructed him to be careful/observant   3  CAD   Pt with stent to RCA in 2001,  stent to occluded RCA in 2009  4  Hx HTN  Hx of dizziness   BP at times low   Losartan  held   Follow as outpt    5  HL    Keep on lipitor     Plan for d/c today    For questions or updates, please contact [facility] Please consult [email or web address] for contact info under     Signed, Vina Gull, MD  04/07/2024, 9:20 AM

## 2024-04-08 ENCOUNTER — Telehealth: Payer: Self-pay | Admitting: *Deleted

## 2024-04-08 ENCOUNTER — Other Ambulatory Visit: Payer: Self-pay

## 2024-04-08 NOTE — Transitions of Care (Post Inpatient/ED Visit) (Signed)
 04/08/2024  Name: Samuel Lawson MRN: [id number] DOB: 10/10/1945  Today's TOC FU Call Status: Today's TOC FU Call Status:: Successful TOC FU Call Completed TOC FU Call Complete Date: 04/08/24 Patient's Name and Date of Birth confirmed.  Transition Care Management Follow-up Telephone Call Date of Discharge: 04/07/24 Discharge Facility: [facility]) Type of Discharge: Inpatient Admission Primary Inpatient Discharge Diagnosis:: Bradycardia How have you been since you were released from the hospital?: Same Any questions or concerns?: No  Items Reviewed: Did you receive and understand the discharge instructions provided?: Yes Medications obtained,verified, and reconciled?: Partial Review Completed Reason for Partial Mediation Review: Patient reports having and taking all medications, declined complete medication review. Any new allergies since your discharge?: No Dietary orders reviewed?: Yes Type of Diet Ordered:: heart healthy, low sodium Do you have support at home?: Yes People in Home [RPT]: alone Name of Support/Comfort Primary Source: Friends-names not provided  Medications Reviewed Today: Medications Reviewed Today     Reviewed by Lucky Andrea LABOR, RN (Registered Nurse) on 04/08/24 at [phone]  Med List Status: <None>   Medication Order Taking? Sig Documenting Provider Last Dose Status Informant  albuterol  (VENTOLIN  HFA) 108 (90 Base) MCG/ACT inhaler [id number] Yes Inhale 1-2 puffs into the lungs every 6 (six) hours as needed. Theotis Haze ORN, NP  Active Self, Pharmacy Records  aspirin  EC 81 MG tablet [id number] Yes Take 81 mg by mouth at bedtime. Swallow whole. [provider]  Active Self, Pharmacy Records  atorvastatin  (LIPITOR) 40 MG tablet [id number] Yes Take 1 tablet (40 mg total) by mouth at bedtime. Theotis Haze ORN, NP  Active Self, Pharmacy Records  Blood Glucose Calibration (ACCU-CHEK GUIDE CONTROL) LIQD [id number]  1 each by In Vitro route once as needed for  up to 1 dose. R73.03 Fleming, Zelda W, NP  Active Self, Pharmacy Records  buPROPion  (WELLBUTRIN  SR) 150 MG 12 hr tablet [id number] Yes Take 1 tablet (150 mg total) by mouth 2 (two) times daily. Theotis Haze ORN, NP  Active Self, Pharmacy Records  Cholecalciferol  (VITAMIN D3) 125 MCG (5000 UT) CAPS [id number]  Take 5,000 Units by mouth daily. When compliant  Patient not taking: Reported on 04/04/2024   [provider]  Active Self, Pharmacy Records           Med Note (MARROW, ROCKY ONEIDA Repress Apr 04, 2024  2:13 PM) Pt reported that he ran out of this medication prior to admission; however, he plans to purchase more once discharged   glucose blood (ACCU-CHEK GUIDE) test strip [id number]  Use as instructed. Check blood glucose by fingerstick once per day. R73.03 Delbert Clam, MD  Active Self, Pharmacy Records  icosapent  Ethyl (VASCEPA ) 1 g capsule [id number] Yes Take 1 capsule (1 g total) by mouth 2 (two) times daily. Please schedule appt with Zelda Fleming. Fleming, Zelda W, NP  Active   losartan  (COZAAR ) 25 MG tablet [id number] Yes Take 1 tablet (25 mg total) by mouth daily. Ladona Heinz, MD  Active Self, Pharmacy Records  Multiple Vitamins-Minerals (MULTIVITAMIN WITH MINERALS) tablet [id number]  Take 1 tablet by mouth daily. [provider]  Active Self, Pharmacy Records    Discontinued 12/07/12 1242   tamsulosin  (FLOMAX ) 0.4 MG CAPS capsule [id number] Yes Take 2 capsules (0.8 mg total) by mouth daily. Please schedule appt with Zelda Fleming. Newlin, Enobong, MD  Active Self, Pharmacy Records            Home Care and Equipment/Supplies: Were Home Health Services Ordered?: No  Any new equipment or medical supplies ordered?: No  Functional Questionnaire: Do you need assistance with bathing/showering or dressing?: No Do you need assistance with meal preparation?: No Do you need assistance with eating?: No Do you have difficulty maintaining continence: No Do you need assistance with  getting out of bed/getting out of a chair/moving?: No Do you have difficulty managing or taking your medications?: No  Follow up appointments reviewed: PCP Follow-up appointment confirmed?: Yes Date of PCP follow-up appointment?: 04/13/24 Follow-up Provider: Haze Servant, NP Specialist Hospital Follow-up appointment confirmed?: Yes Date of Specialist follow-up appointment?: 04/19/24 Follow-Up Specialty Provider:: Cardiology Do you need transportation to your follow-up appointment?: No Do you understand care options if your condition(s) worsen?: Yes-patient verbalized understanding  RNCM unable to complete TOC assessment, SDOH and Medication Review. Patient became frustrated with all of the questions and ended the call, thank you, bye.  Andrea Dimes RN, BSN [place]  [facility] RN Care Manager [phone]

## 2024-04-09 ENCOUNTER — Encounter: Payer: Self-pay | Admitting: Emergency Medicine

## 2024-04-09 ENCOUNTER — Emergency Department (HOSPITAL_COMMUNITY)

## 2024-04-09 ENCOUNTER — Encounter (HOSPITAL_COMMUNITY): Payer: Self-pay

## 2024-04-09 ENCOUNTER — Encounter (HOSPITAL_COMMUNITY): Payer: Self-pay | Admitting: Emergency Medicine

## 2024-04-09 ENCOUNTER — Emergency Department (HOSPITAL_COMMUNITY)
Admission: EM | Admit: 2024-04-09 | Discharge: 2024-04-10 | Disposition: A | Discharge status: Home / Self Care | Source: Home / Self Care

## 2024-04-09 ENCOUNTER — Telehealth: Payer: Self-pay

## 2024-04-09 ENCOUNTER — Other Ambulatory Visit: Payer: Self-pay

## 2024-04-09 ENCOUNTER — Emergency Department (HOSPITAL_COMMUNITY): Admission: EM | Admit: 2024-04-09 | Discharge: 2024-04-09 | Discharge status: Against Medical Advice

## 2024-04-09 DIAGNOSIS — Z5321 Procedure and treatment not carried out due to patient leaving prior to being seen by health care provider: Secondary | ICD-10-CM | POA: Insufficient documentation

## 2024-04-09 DIAGNOSIS — Z950 Presence of cardiac pacemaker: Secondary | ICD-10-CM | POA: Insufficient documentation

## 2024-04-09 DIAGNOSIS — R008 Other abnormalities of heart beat: Secondary | ICD-10-CM | POA: Insufficient documentation

## 2024-04-09 DIAGNOSIS — Z905 Acquired absence of kidney: Secondary | ICD-10-CM | POA: Diagnosis not present

## 2024-04-09 DIAGNOSIS — R0789 Other chest pain: Secondary | ICD-10-CM | POA: Insufficient documentation

## 2024-04-09 DIAGNOSIS — R42 Dizziness and giddiness: Secondary | ICD-10-CM | POA: Insufficient documentation

## 2024-04-09 DIAGNOSIS — I2510 Atherosclerotic heart disease of native coronary artery without angina pectoris: Secondary | ICD-10-CM | POA: Diagnosis not present

## 2024-04-09 DIAGNOSIS — R002 Palpitations: Secondary | ICD-10-CM | POA: Insufficient documentation

## 2024-04-09 DIAGNOSIS — Z7982 Long term (current) use of aspirin: Secondary | ICD-10-CM | POA: Insufficient documentation

## 2024-04-09 DIAGNOSIS — Z85528 Personal history of other malignant neoplasm of kidney: Secondary | ICD-10-CM | POA: Insufficient documentation

## 2024-04-09 LAB — BASIC METABOLIC PANEL WITH GFR
Anion gap: 8 (ref 5–15)
BUN: 18 mg/dL (ref 8–23)
CO2: 21 mmol/L — ABNORMAL LOW (ref 22–32)
Calcium: 9 mg/dL (ref 8.9–10.3)
Chloride: 105 mmol/L (ref 98–111)
Creatinine, Ser: 1.26 mg/dL — ABNORMAL HIGH (ref 0.61–1.24)
GFR, Estimated: 59 mL/min — ABNORMAL LOW (ref >60)
Glucose, Bld: 120 mg/dL — ABNORMAL HIGH (ref 70–99)
Potassium: 3.9 mmol/L (ref 3.5–5.1)
Sodium: 134 mmol/L — ABNORMAL LOW (ref 135–145)

## 2024-04-09 LAB — CBC
HCT: 48.6 % (ref 39.0–52.0)
Hemoglobin: 16.2 g/dL (ref 13.0–17.0)
MCH: 31.7 pg (ref 26.0–34.0)
MCHC: 33.3 g/dL (ref 30.0–36.0)
MCV: 95.1 fL (ref 80.0–100.0)
Platelets: 102 K/uL — ABNORMAL LOW (ref 150–400)
RBC: 5.11 MIL/uL (ref 4.22–5.81)
RDW: 12.5 % (ref 11.5–15.5)
WBC: 6.6 K/uL (ref 4.0–10.5)
nRBC: 0 % (ref 0.0–0.2)

## 2024-04-09 LAB — TROPONIN I (HIGH SENSITIVITY)
Troponin I (High Sensitivity): 25 ng/L — ABNORMAL HIGH (ref <18)
Troponin I (High Sensitivity): 23 ng/L — ABNORMAL HIGH (ref <18)

## 2024-04-09 NOTE — ED Notes (Signed)
 Pt. Called for x3 w/ no response. Moved OTF

## 2024-04-09 NOTE — ED Notes (Signed)
 Medtronic pacemaker interrogated at this time.

## 2024-04-09 NOTE — ED Triage Notes (Signed)
 Pt came in via POV after having a pacemaker placed a couple days ago & had a lot of pain during the night when he was sleeping on his back. Then this morning he had no pain, but when he palpated his pulse in his Lt wrist at home he reports that he was feeling it but every now & then it would feel like it would stop. He was advised to come in for eval to make sure the pacemaker was still working. A/Ox4, denies current pain. Does endorse having occasional dizziness as well.  Pt left briefly and missed his name being called, so he has had to check in and start over per charge RN.

## 2024-04-09 NOTE — Telephone Encounter (Signed)
 Follow-up after same day discharge: Implant date: 04/06/2024 MD: Soyla Norton, MD Device: ppm Location: l chest    Wound check visit: 04/20/2024 90 day MD follow-up: 07/19/2024  Remote Transmission received:yes  Dressing/sling removed: YES  Confirm OAC restart on: N/A   Please continue to monitor your cardiac device site for redness, swelling, and drainage. Call the device clinic at [phone] if you experience these symptoms, fever/chills, or have questions about your device.   Remote monitoring is used to monitor your cardiac device from home. This monitoring is scheduled every 91 days by our office. It allows us  to keep an eye on the functioning of your device to ensure it is working properly.

## 2024-04-09 NOTE — ED Provider Triage Note (Signed)
 Emergency Medicine Provider Triage Evaluation Note  Samuel Lawson , a 78 y.o. male  was evaluated in triage.  Pt complains of chest discomfort, irregular heartbeat, dizziness.  Patient had pacemaker placed several days ago, has felt left chest soreness since the time of surgery which he was told was normal, however he did sleep on his left side last night for several minutes after being told not to, he subsequently felt that his heart was beating irregularly when he felt his pulse and had a brief episode of dizziness, he was instructed to come to the ED by his cardiologist.  Denies shortness of breath, nausea/vomiting, lightheadedness/syncope.  Review of Systems  Positive: As above Negative: As above  Physical Exam  BP (!) 140/82 (BP Location: Right Arm)   Pulse 91   Temp 97.8 F (36.6 C)   Resp 17   Ht 5' 8 (1.727 m)   Wt 80.7 kg   SpO2 97%   BMI 27.06 kg/m  Gen:   Awake, no distress   Resp:  Normal effort  MSK:   Moves extremities without difficulty  Other:  Bandage over left chest overlying pacemaker site, regular rate and rhythm  Medical Decision Making  Medically screening exam initiated at 12:42 PM.  Appropriate orders placed.  Tyreque Finken was informed that the remainder of the evaluation will be completed by another provider, this initial triage assessment does not replace that evaluation, and the importance of remaining in the ED until their evaluation is complete.     Glendia Rocky SAILOR, [place] 04/09/24 1244

## 2024-04-09 NOTE — ED Provider Triage Note (Signed)
 Emergency Medicine Provider Triage Evaluation Note  Samuel Lawson , a 78 y.o. male  was evaluated in triage.   Patient was seen earlier today but had to leave to return a car to someone he had borrowed it from, his name was called the lobby 3 times however he was not here.  Pt complains of chest discomfort, irregular heartbeat, dizziness.  Patient had pacemaker placed several days ago, has felt left chest soreness since the time of surgery which he was told was normal, however today he notes checking his radial pulse and feeling that it is irregular.    Review of Systems  Positive: As above Negative: As above  Physical Exam  BP 129/89 (BP Location: Right Arm)   Pulse (!) 101   Temp 98.3 F (36.8 C) (Oral)   Resp 20   Ht 5' 8 (1.727 m)   Wt 80.7 kg   SpO2 98%   BMI 27.06 kg/m  Gen:   Awake, no distress   Resp:  Normal effort  MSK:   Moves extremities without difficulty  Other:  Bandage over left chest overlying pacemaker site, regular rate and rhythm  Medical Decision Making  Medically screening exam initiated at 9:04 PM.  Appropriate orders placed.  Samuel Lawson was informed that the remainder of the evaluation will be completed by another provider, this initial triage assessment does not replace that evaluation, and the importance of remaining in the ED until their evaluation is complete.  Patient was seen in triage earlier today for the same complaints.  Patient has already had blood work done earlier today, but second troponin was not collected, will collect this now given that first troponin was mildly elevated.  Please see my previous note for additional information.     Samuel Lawson SAILOR, [place] 04/09/24 2106

## 2024-04-09 NOTE — ED Triage Notes (Addendum)
 Pt came in via POV after having a pacemaker placed a couple days ago & had a lot of pain during the night when he was sleeping on his back. Then this morning he had no pain, but when he palpated his pulse in his Lt wrist at home he reports that he was feeling it but every now & then it would feel like it would stop. He was advised to come in for eval to make sure the pacemaker was still working. A/Ox4, denies current pain. Does endorse having occasional dizziness as well.

## 2024-04-09 NOTE — ED Provider Notes (Cosign Needed Addendum)
 [place] EMERGENCY DEPARTMENT AT [facility] Provider Note   CSN: [id number] Arrival date & time: 04/09/24  2022     Patient presents with: Pacemaker Problem   Samuel Lawson is a 78 y.o. male who presents 2 days s/p pacer placement ([employer]) for symptomatic bradycardia, by Dr. Terrea. He returns to the ED today with concern for palpitations and discomfort in the left chest.  Patient states that he was having pain in his left chest in his arm after he laid on his left side which he was directed not to do after the procedure by his cardiologist.  He states that this morning when he woke his pain was gone but when he palpated his pulse in his wrist he felt like it was irregular prompting ED visit.  Currently denies any chest pain palpitations shortness of breath or syncopal symptoms.  Has endorsed intermittent lightheadedness.  Of note patient was in the ED earlier today and left briefly, had to check back in.  Please see labs from earlier today.  Additional hx of CAD, renal cancer s/p left partial nephrectomy in 2023, Complete AV block.    HPI     Prior to Admission medications   Medication Sig Start Date End Date Taking? Authorizing Provider  albuterol  (VENTOLIN  HFA) 108 (90 Base) MCG/ACT inhaler Inhale 1-2 puffs into the lungs every 6 (six) hours as needed. 07/04/23   Theotis Haze ORN, NP  aspirin  EC 81 MG tablet Take 81 mg by mouth at bedtime. Swallow whole.    [provider]  atorvastatin  (LIPITOR) 40 MG tablet Take 1 tablet (40 mg total) by mouth at bedtime. 07/04/23   Fleming, Zelda W, NP  Blood Glucose Calibration (ACCU-CHEK GUIDE CONTROL) LIQD 1 each by In Vitro route once as needed for up to 1 dose. R73.03 01/07/20   Fleming, Zelda W, NP  buPROPion  (WELLBUTRIN  SR) 150 MG 12 hr tablet Take 1 tablet (150 mg total) by mouth 2 (two) times daily. 03/30/24   Fleming, Zelda W, NP  Cholecalciferol  (VITAMIN D3) 125 MCG (5000 UT) CAPS Take 5,000 Units by mouth daily.  When compliant Patient not taking: Reported on 04/04/2024    [provider]  glucose blood (ACCU-CHEK GUIDE) test strip Use as instructed. Check blood glucose by fingerstick once per day. R73.03 10/23/21   Newlin, Enobong, MD  icosapent  Ethyl (VASCEPA ) 1 g capsule Take 1 capsule (1 g total) by mouth 2 (two) times daily. Please schedule appt with Haze Theotis. 04/04/24   Fleming, Zelda W, NP  losartan  (COZAAR ) 25 MG tablet Take 1 tablet (25 mg total) by mouth daily. 03/31/24 03/31/25  Ladona Heinz, MD  Multiple Vitamins-Minerals (MULTIVITAMIN WITH MINERALS) tablet Take 1 tablet by mouth daily.    [provider]  tamsulosin  (FLOMAX ) 0.4 MG CAPS capsule Take 2 capsules (0.8 mg total) by mouth daily. Please schedule appt with Zelda Fleming. 03/23/24   Newlin, Enobong, MD  promethazine  (PHENERGAN ) 25 MG tablet Take 1 tablet (25 mg total) by mouth every 6 (six) hours as needed for nausea. 06/18/12 12/07/12  Schinlever, Dorothyann, PA-C    Allergies: Patient has no known allergies.    Review of Systems  Cardiovascular:  Positive for chest pain and palpitations.    Updated Vital Signs BP 129/73   Pulse 79   Temp 98.3 F (36.8 C)   Resp 16   Ht 5' 8 (1.727 m)   Wt 80.7 kg   SpO2 95%   BMI 27.06 kg/m  Physical Exam Vitals and nursing note reviewed.  Constitutional:      Appearance: He is not ill-appearing or toxic-appearing.  HENT:     Head: Normocephalic and atraumatic.     Mouth/Throat:     Mouth: Mucous membranes are moist.     Pharynx: No oropharyngeal exudate or posterior oropharyngeal erythema.  Eyes:     General:        Right eye: No discharge.        Left eye: No discharge.     Conjunctiva/sclera: Conjunctivae normal.  Cardiovascular:     Rate and Rhythm: Normal rate and regular rhythm.     Pulses: Normal pulses.     Heart sounds: Normal heart sounds. No murmur heard. Pulmonary:     Effort: Pulmonary effort is normal. No respiratory distress.     Breath  sounds: Normal breath sounds. No wheezing or rales.  Chest:    Abdominal:     General: Bowel sounds are normal. There is no distension.     Palpations: Abdomen is soft.     Tenderness: There is no abdominal tenderness.  Musculoskeletal:        General: No deformity.     Cervical back: Neck supple.     Right lower leg: No edema.     Left lower leg: No edema.  Skin:    General: Skin is warm and dry.     Capillary Refill: Capillary refill takes less than 2 seconds.  Neurological:     General: No focal deficit present.     Mental Status: He is alert and oriented to person, place, and time. Mental status is at baseline.  Psychiatric:        Mood and Affect: Mood normal.     (all labs ordered are listed, but only abnormal results are displayed) Labs Reviewed  TROPONIN I (HIGH SENSITIVITY)    EKG: None  Radiology: DG Chest 2 View Result Date: 04/09/2024 CLINICAL DATA:  Recent pacemaker placement, dizziness and chest discomfort EXAM: CHEST - 2 VIEW COMPARISON:  Chest radiograph April 07, 2024 FINDINGS: The heart size and mediastinal contours are within normal limits. Left chest wall pacemaker in place, similar to prior, leads projecting over the right atrium and ventricle. No both lungs are clear. No pleural effusion or pneumothorax. The visualized skeletal structures are unremarkable. IMPRESSION: Stable position of left chest wall pacemaker. No active cardiopulmonary disease. Electronically Signed   By: Megan  Zare M.D.   On: 04/09/2024 13:14     Procedures   Medications Ordered in the ED - No data to display                                  Medical Decision Making 78 year old male 2 days status post pacer placement who presents with concern for palpitations.  Vitals reassuring on intake.  Cardiopulmonary exam is unremarkable with regular rate and rhythm in the 80s bpm, abdominal exam is benign.  Patient is neuro vas intact nontraumatic.  DDx includes limited to patient  malfunction, dysrhythmia, electrolyte abnormality, muscular discomfort.  Amount and/or Complexity of Data Reviewed Labs:     Details: Patient had labs done earlier in the day preceding ED visit with CBC remarkable only for thrombocytopenia with platelets of 102 at patient's baseline, BMP with baseline creatinine of 1.26, mildly low sodium at 134.  Troponin mildly elevated to 25 but downtrending to 23 on recheck.  Radiology: ordered. ECG/medicine  tests:     Details: Atrial sensed, ventricular paced rhythm with HR of 104.   Third troponin collected at time of my evaluation but was lost by the lab.  Patient with normal sinus rhythm on EKG and the cardiac monitor throughout his stay in the emergency department.  He was encouraged to remain in the emergency department pending report from [employer] regarding patient's pacer function over the last 48 hours, however patient refused to stay and eloped from the emergency department prior to my arrival back to the bedside to discuss disposition planning and possible AMA.  He was hemodynamically stable throughout his stay in the emergency room and is welcome back for completion of his evaluation at any time.  This chart was dictated using voice recognition software, Dragon. Despite the best efforts of this provider to proofread and correct errors, errors may still occur which can change documentation meaning.      Final diagnoses:  Palpitations    ED Discharge Orders     None          Bobette Pleasant SAUNDERS, PA-C 04/10/24 0549    Armel Rabbani, Pleasant SAUNDERS, PA-C 04/10/24 0549    Ula Prentice SAUNDERS, MD 04/12/24 [phone]

## 2024-04-10 NOTE — Discharge Instructions (Signed)
 You were seen in the ER today for your palpitations. Your labs, xray, and exam were reassuring. You chose to leave the ER before we received the information about your pace maker interrogation. Please follow up with your cardiologist; call them Monday morning to schedule a more urgent follow up. Return to the ER with any new severe symptoms.

## 2024-04-10 NOTE — ED Notes (Signed)
 The pt is anxious to leave  he wants to know why it is taking so long

## 2024-04-10 NOTE — ED Notes (Signed)
 The pt is threatening to walk out  I already told the pt that he was waiting on his pacemaker  result he is very unhappy

## 2024-04-10 NOTE — ED Notes (Signed)
 The pt left for the second time today  I was told that the pt was gone a few minutes after I came out of another pts room I di not see heim leave he did not sign anything

## 2024-04-12 ENCOUNTER — Telehealth: Payer: Self-pay | Admitting: Nurse Practitioner

## 2024-04-12 NOTE — Telephone Encounter (Signed)
 Called patient to confirm upcoming appointment 04/13/2024 at 9:30 am. Patient appointment has been successfully confirmed

## 2024-04-13 ENCOUNTER — Encounter: Payer: Self-pay | Admitting: Nurse Practitioner

## 2024-04-13 ENCOUNTER — Ambulatory Visit: Attending: Nurse Practitioner | Admitting: Nurse Practitioner

## 2024-04-13 VITALS — BP 120/76 | HR 72 | Resp 19 | Ht 68.0 in | Wt 178.6 lb

## 2024-04-13 DIAGNOSIS — Z09 Encounter for follow-up examination after completed treatment for conditions other than malignant neoplasm: Secondary | ICD-10-CM

## 2024-04-13 DIAGNOSIS — I442 Atrioventricular block, complete: Secondary | ICD-10-CM

## 2024-04-13 NOTE — Progress Notes (Unsigned)
 Assessment & Plan:  Samuel Lawson was seen today for hospitalization follow-up.  Diagnoses and all orders for this visit:  Heart block AV complete  Follow up with Cardiology as scheduled.   Hospital discharge follow-up Cardiac arrhythmia with pacemaker Pacemaker in place for arrhythmia. Recent bradycardia episode not linked to carvedilol . Atrial function adequate, ventricular function weak, pacemaker expected to improve. - Order pulse oximeter to check insurance coverage. - Ensure cardiology follow-up on August 12th. - Schedule remote pacemaker checks every 90 days.  Hernia repair Scheduled for hernia repair on September 17th. Previous mesh placement   Patient has been counseled on age-appropriate routine health concerns for screening and prevention. These are reviewed and up-to-date. Referrals have been placed accordingly. Immunizations are up-to-date or declined.    Subjective:   Chief Complaint  Patient presents with   Hospitalization Follow-up   History of Present Illness Samuel Lawson is a 78 year old male who presents for a hospital follow-up related to symptomatic bradycardia. He was admitted and discharged on 04-04-2024 through 04-07-2024 after having a PPM placed 2/2 2:1 AV block.   He is feeling well today. Reports no current dizziness, and he is feeling better overall.  He is scheduled to see Cardiology next week for further evaluation and to check the area where his pacemaker is located. His pacemaker will be checked remotely every ninety days.  He is scheduled for a hernia repair operation on September 17th. He previously had a mesh which failed due to noncompliance with post op orders.    Review of Systems  Constitutional:  Negative for fever, malaise/fatigue and weight loss.  HENT: Negative.  Negative for nosebleeds.   Eyes: Negative.  Negative for blurred vision, double vision and photophobia.  Respiratory: Negative.  Negative for cough and shortness of breath.    Cardiovascular: Negative.  Negative for chest pain, palpitations and leg swelling.  Gastrointestinal: Negative.  Negative for heartburn, nausea and vomiting.  Musculoskeletal: Negative.  Negative for myalgias.  Neurological: Negative.  Negative for dizziness, focal weakness, seizures and headaches.  Psychiatric/Behavioral: Negative.  Negative for suicidal ideas.     Past Medical History:  Diagnosis Date   Acute MI [facility]) 2009   STENTS PLACED   Chronic kidney disease    Collagen vascular disease (HCC)    Hyperlipidemia    Hypertension    Pre-diabetes    Prediabetes     Past Surgical History:  Procedure Laterality Date   ANGIOPLASTY     CARDIAC CATHETERIZATION     INCISIONAL HERNIA REPAIR N/A 05/02/2022   Procedure: LAPAROSCOPIC INCISIONAL HERNIA REPAIR WITH MESH;  Surgeon: Vernetta Berg, MD;  Location: [facility] ORS;  Service: General;  Laterality: N/A;   PACEMAKER IMPLANT N/A 04/06/2024   Procedure: PACEMAKER IMPLANT;  Surgeon: Inocencio Soyla Lunger, MD;  Location: [facility] INVASIVE CV LAB;  Service: Cardiovascular;  Laterality: N/A;   ROBOTIC ASSITED PARTIAL NEPHRECTOMY Left 12/05/2021   Procedure: XI ROBOTIC ASSITED PARTIAL NEPHRECTOMY;  Surgeon: Cam Morene ORN, MD;  Location: [facility] OR[facility];  Service: Urology;  Laterality: Left;  3.5 HRS    Family History  Problem Relation Age of Onset   Heart Problems Mother    Stroke Father    Alzheimer's disease Father    High blood pressure Father    Colon cancer Neg Hx    Colon polyps Neg Hx    Esophageal cancer Neg Hx    Rectal cancer Neg Hx    Stomach cancer Neg Hx     Social History Reviewed  with no changes to be made today.   Outpatient Medications Prior to Visit  Medication Sig Dispense Refill   albuterol  (VENTOLIN  HFA) 108 (90 Base) MCG/ACT inhaler Inhale 1-2 puffs into the lungs every 6 (six) hours as needed. 18 g 1   aspirin  EC 81 MG tablet Take 81 mg by mouth at bedtime. Swallow whole.     atorvastatin  (LIPITOR) 40 MG tablet Take 1  tablet (40 mg total) by mouth at bedtime. 90 tablet 3   buPROPion  (WELLBUTRIN  SR) 150 MG 12 hr tablet Take 1 tablet (150 mg total) by mouth 2 (two) times daily. 180 tablet 3   Cholecalciferol  (VITAMIN D3) 125 MCG (5000 UT) CAPS Take 5,000 Units by mouth daily. When compliant     glucose blood (ACCU-CHEK GUIDE) test strip Use as instructed. Check blood glucose by fingerstick once per day. R73.03 100 each 0   icosapent  Ethyl (VASCEPA ) 1 g capsule Take 1 capsule (1 g total) by mouth 2 (two) times daily. Please schedule appt with Mahira Petras. 60 capsule 0   Multiple Vitamins-Minerals (MULTIVITAMIN WITH MINERALS) tablet Take 1 tablet by mouth daily.     tamsulosin  (FLOMAX ) 0.4 MG CAPS capsule Take 2 capsules (0.8 mg total) by mouth daily. Please schedule appt with Avanell Banwart. 60 capsule 0   Blood Glucose Calibration (ACCU-CHEK GUIDE CONTROL) LIQD 1 each by In Vitro route once as needed for up to 1 dose. R73.03 (Patient not taking: Reported on 04/13/2024) 1 each 0   losartan  (COZAAR ) 25 MG tablet Take 1 tablet (25 mg total) by mouth daily. (Patient not taking: Reported on 04/13/2024) 30 tablet 2   No facility-administered medications prior to visit.    No Known Allergies     Objective:    BP 120/76 (BP Location: Left Arm, Patient Position: Sitting, Cuff Size: Normal)   Pulse 72   Resp 19   Ht 5' 8 (1.727 m)   Wt 178 lb 9.6 oz (81 kg)   SpO2 100%   BMI 27.16 kg/m  Wt Readings from Last 3 Encounters:  04/13/24 178 lb 9.6 oz (81 kg)  04/09/24 178 lb (80.7 kg)  04/09/24 178 lb (80.7 kg)    Physical Exam Vitals and nursing note reviewed.  Constitutional:      Appearance: He is well-developed.  HENT:     Head: Normocephalic and atraumatic.  Cardiovascular:     Rate and Rhythm: Normal rate and regular rhythm.     Heart sounds: Normal heart sounds. No murmur heard.    No friction rub. No gallop.  Pulmonary:     Effort: Pulmonary effort is normal. No tachypnea or respiratory distress.      Breath sounds: Normal breath sounds. No decreased breath sounds, wheezing, rhonchi or rales.  Chest:     Chest wall: No tenderness.  Abdominal:     General: Bowel sounds are normal.     Palpations: Abdomen is soft.  Musculoskeletal:        General: Normal range of motion.     Cervical back: Normal range of motion.  Skin:    General: Skin is warm and dry.  Neurological:     Mental Status: He is alert and oriented to person, place, and time.     Coordination: Coordination normal.  Psychiatric:        Behavior: Behavior normal. Behavior is cooperative.        Thought Content: Thought content normal.        Judgment: Judgment normal.  Patient has been counseled extensively about nutrition and exercise as well as the importance of adherence with medications and regular follow-up. The patient was given clear instructions to go to ER or return to medical center if symptoms don't improve, worsen or new problems develop. The patient verbalized understanding.   Follow-up: Return in about 3 months (around 07/14/2024) for a1c.   Samuel LELON Servant, FNP-BC [facility] [place], [place] [phone]   04/13/2024, 11:11 AM

## 2024-04-15 ENCOUNTER — Encounter: Payer: Self-pay | Admitting: Nurse Practitioner

## 2024-04-16 NOTE — Patient Instructions (Signed)
   After Your Pacemaker   Monitor your pacemaker site for redness, swelling, and drainage. Call the device clinic at [phone] if you experience these symptoms or fever/chills.  Your incision was closed with Steri-strips or staples:  You may shower 7 days after your procedure and wash your incision with soap and water. Avoid lotions, ointments, or perfumes over your incision until it is well-healed.  You may use a hot tub or a pool after your wound check appointment if the incision is completely closed.  Do not lift, push or pull greater than 10 pounds with the affected arm until 6 weeks after your procedure. UNTIL AFTER SEPTEMBER 9TH. There are no other restrictions in arm movement after your wound check appointment.  You may drive, unless driving has been restricted by your healthcare providers.   Remote monitoring is used to monitor your pacemaker from home. This monitoring is scheduled every 91 days by our office. It allows [place] to keep an eye on the functioning of your device to ensure it is working properly. You will routinely see your Electrophysiologist annually (more often if necessary).

## 2024-04-18 ENCOUNTER — Other Ambulatory Visit: Payer: Self-pay | Admitting: Family Medicine

## 2024-04-18 DIAGNOSIS — N400 Enlarged prostate without lower urinary tract symptoms: Secondary | ICD-10-CM

## 2024-04-19 ENCOUNTER — Other Ambulatory Visit: Payer: Self-pay | Admitting: Family Medicine

## 2024-04-19 ENCOUNTER — Other Ambulatory Visit: Payer: Self-pay

## 2024-04-19 ENCOUNTER — Ambulatory Visit: Admitting: Physician Assistant

## 2024-04-19 DIAGNOSIS — N400 Enlarged prostate without lower urinary tract symptoms: Secondary | ICD-10-CM

## 2024-04-20 ENCOUNTER — Other Ambulatory Visit: Payer: Self-pay | Admitting: Family Medicine

## 2024-04-20 ENCOUNTER — Ambulatory Visit: Payer: Self-pay | Admitting: Cardiology

## 2024-04-20 ENCOUNTER — Other Ambulatory Visit: Payer: Self-pay

## 2024-04-20 ENCOUNTER — Ambulatory Visit: Attending: Cardiology

## 2024-04-20 ENCOUNTER — Other Ambulatory Visit: Payer: Self-pay | Admitting: Nurse Practitioner

## 2024-04-20 DIAGNOSIS — F17200 Nicotine dependence, unspecified, uncomplicated: Secondary | ICD-10-CM

## 2024-04-20 DIAGNOSIS — N400 Enlarged prostate without lower urinary tract symptoms: Secondary | ICD-10-CM

## 2024-04-20 DIAGNOSIS — I441 Atrioventricular block, second degree: Secondary | ICD-10-CM | POA: Diagnosis not present

## 2024-04-20 DIAGNOSIS — I2510 Atherosclerotic heart disease of native coronary artery without angina pectoris: Secondary | ICD-10-CM

## 2024-04-20 DIAGNOSIS — E785 Hyperlipidemia, unspecified: Secondary | ICD-10-CM

## 2024-04-20 LAB — CUP PACEART INCLINIC DEVICE CHECK
Date Time Interrogation Session: 20250812143050
Implantable Lead Connection Status: 753985
Implantable Lead Connection Status: 753985
Implantable Lead Implant Date: 20250729
Implantable Lead Implant Date: 20250729
Implantable Lead Location: 753859
Implantable Lead Location: 753860
Implantable Lead Model: 3830
Implantable Lead Model: 5076
Implantable Pulse Generator Implant Date: 20250729

## 2024-04-20 MED ORDER — TAMSULOSIN HCL 0.4 MG PO CAPS
0.8000 mg | ORAL_CAPSULE | Freq: Every day | ORAL | 1 refills | Status: AC
  Filled 2024-04-20 (×2): qty 180, 90d supply, fill #0
  Filled 2024-07-21: qty 180, 90d supply, fill #1

## 2024-04-20 NOTE — Progress Notes (Signed)
 Normal dual chamber pacemaker wound check. Presenting rhythm: AS/VP 95. Wound well healed. Routine testing performed. Thresholds, sensing, and impedances consistent with implant measurements and at 3.5V safety margin/auto capture until 3 month visit. No episodes. Reviewed arm restrictions to continue for 6 weeks total post op.  Pt enrolled in remote follow-up.

## 2024-04-20 NOTE — Telephone Encounter (Signed)
 Copied from CRM [id number]. Topic: Clinical - Medication Refill >> Apr 20, 2024  4:03 PM Teressa P wrote: Medication:  Flomax  0.4 mg Vascepa  1 g capsule Albuterol  Inhaler   This is the patient's preferred pharmacy:  [facility] [address] [place] [address] Phone: [phone] Fax: [phone]  Is this the correct pharmacy for this prescription? Yes If no, delete pharmacy and type the correct one.   Has the prescription been filled recently? Yes  Is the patient out of the medication? No  Has the patient been seen for an appointment in the last year OR does the patient have an upcoming appointment? No  Can we respond through MyChart? No  Agent: Please be advised that Rx refills may take up to 3 business days. We ask that you follow-up with your pharmacy.

## 2024-04-22 ENCOUNTER — Other Ambulatory Visit: Payer: Self-pay

## 2024-04-23 ENCOUNTER — Other Ambulatory Visit: Payer: Self-pay

## 2024-04-23 MED ORDER — ALBUTEROL SULFATE HFA 108 (90 BASE) MCG/ACT IN AERS
1.0000 | INHALATION_SPRAY | Freq: Four times a day (QID) | RESPIRATORY_TRACT | 1 refills | Status: DC | PRN
  Filled 2024-04-23: qty 18, 25d supply, fill #0
  Filled 2024-07-05: qty 18, 25d supply, fill #1

## 2024-04-23 MED ORDER — ICOSAPENT ETHYL 1 G PO CAPS
1.0000 g | ORAL_CAPSULE | Freq: Two times a day (BID) | ORAL | 1 refills | Status: AC
  Filled 2024-04-23 – 2024-04-30 (×4): qty 180, 90d supply, fill #0
  Filled 2024-08-06: qty 180, 90d supply, fill #1

## 2024-04-23 NOTE — Telephone Encounter (Signed)
 Requested medication (s) are due for refill today: Flomax  refilled 04/20/24  Requested medication (s) are on the active medication list: Yes  Last refill:    Future visit scheduled: Yes  Notes to clinic:  Vas[facility]pa  manual review.    Requested Prescriptions  Pending Prescriptions Disp Refills   tamsulosin  (FLOMAX ) 0.4 MG CAPS capsule 180 capsule 1    Sig: Take 2 capsules (0.8 mg total) by mouth daily.     Urology: Alpha-Adrenergic Blocker Failed - 04/23/2024 12:21 PM      Failed - PSA in normal range and within 360 days    PSA  Date Value Ref Range Status  07/19/2015 1.09 <=4.00 ng/mL Final    Comment:    Test Methodology: ECLIA PSA (Electrochemiluminescence Immunoassay)   For PSA values from 2.5-4.0, particularly in younger men <20 years old, the AUA and NCCN suggest testing for % Free PSA (3515) and evaluation of the rate of increase in PSA (PSA velocity).    Prostate Specific Ag, Serum  Date Value Ref Range Status  04/07/2020 1.3 0.0 - 4.0 ng/mL Final    Comment:    Roche ECLIA methodology. According to the American Urological Association, Serum PSA should decrease and remain at undetectable levels after radical prostatectomy. The AUA defines biochemical recurrence as an initial PSA value 0.2 ng/mL or greater followed by a subsequent confirmatory PSA value 0.2 ng/mL or greater. Values obtained with different assay methods or kits cannot be used interchangeably. Results cannot be interpreted as absolute evidence of the presence or absence of malignant disease.          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 [facility] Theotis Haze ORN, NP   3 weeks ago Bradycardia   [place] [facility] [place] - [facility] Dept Of [place]  [facility] Theotis Haze ORN, NP   9 months ago Bronchitis   [place] [facility] - [facility] Dept Of [place]. [facility] Theotis Haze ORN, NP   1 year ago Primary hypertension   [place] [facility] [place] - A Dept Of [place]. [facility] Theotis Haze ORN, NP   1 year ago Prediabetes   [place] [facility] [place] - A Dept Of [place]. [facility] [place], Zelda W, NP               icosapent  Ethyl (VASCEPA ) 1 g capsule 60 capsule 0    Sig: Take 1 capsule (1 g total) by mouth 2 (two) times daily. Please schedule appt with Zelda Fleming.     Off-Protocol Failed - 04/23/2024 12:21 PM      Failed - Medication not assigned to a protocol, review manually.      Passed - Valid encounter within last 12 months    Recent Outpatient Visits           1 week ago Heart block AV complete [facility])   [place] [facility] - A Dept Of [place]. [facility] Theotis Haze ORN, NP   3 weeks ago Bradycardia   [facility] - A Dept Of [place]. [facility] Theotis Haze ORN, NP   9 months ago Bronchitis   [place] [facility] [place] - A Dept Of [place]. [facility] Theotis Haze ORN, NP   1 year ago Primary hypertension   [place] [facility] [place] - A Dept Of [place]. [facility] Theotis Haze ORN, NP   1 year ago Prediabetes   [place] [facility] [place] - A Dept Of [place]. [facility] Theotis Haze ORN, NP              Signed Prescriptions Disp Refills   albuterol  (VENTOLIN  HFA) 108 (90 Base) MCG/ACT inhaler 18 g 1    Sig: Inhale 1-2 puffs into the lungs every 6 (six) hours as needed.     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 [facility] Theotis Haze ORN, NP   3 weeks ago Bradycardia   [place] [facility] [place] - A Dept Of [place]. [facility] Theotis Haze ORN, NP   9 months ago Bronchitis   [place] [facility] [place] [place] - A Dept Of [place]. [facility] Theotis Haze ORN, NP   1 year ago Primary hypertension   [place] [facility] [place] - A Dept Of [place]. [facility] Theotis Haze ORN, NP   1 year ago Prediabetes    [facility] [place] - A Dept Of . [facility] Theotis Haze ORN, [place]

## 2024-04-23 NOTE — Telephone Encounter (Signed)
 Requested Prescriptions  Pending Prescriptions Disp Refills   tamsulosin  (FLOMAX ) 0.4 MG CAPS capsule 180 capsule 1    Sig: Take 2 capsules (0.8 mg total) by mouth daily.     Urology: Alpha-Adrenergic Blocker Failed - 04/23/2024 12:20 PM      Failed - PSA in normal range and within 360 days    PSA  Date Value Ref Range Status  07/19/2015 1.09 <=4.00 ng/mL Final    Comment:    Test Methodology: ECLIA PSA (Electrochemiluminescence Immunoassay)   For PSA values from 2.5-4.0, particularly in younger men <73 years old, the AUA and NCCN suggest testing for % Free PSA (3515) and evaluation of the rate of increase in PSA (PSA velocity).    Prostate Specific Ag, Serum  Date Value Ref Range Status  04/07/2020 1.3 0.0 - 4.0 ng/mL Final    Comment:    Roche ECLIA methodology. According to the American Urological Association, Serum PSA should decrease and remain at undetectable levels after radical prostatectomy. The AUA defines biochemical recurrence as an initial PSA value 0.2 ng/mL or greater followed by a subsequent confirmatory PSA value 0.2 ng/mL or greater. Values obtained with different assay methods or kits cannot be used interchangeably. Results cannot be interpreted as absolute evidence of the presence or absence of malignant disease.          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 [facility] Theotis Haze ORN, NP   3 weeks ago Bradycardia   [place] [facility] [place] - A Dept Of [place]. [facility] Theotis Haze ORN, NP   9 months ago Bronchitis   [place] [facility] [place] - A Dept Of [place]. [facility] Theotis Haze ORN, NP   1 year ago Primary hypertension   [place] [facility]  [place] - A Dept Of [place]. [facility] Theotis Haze ORN, NP   1 year ago Prediabetes   [place] [facility] [place] - A Dept Of [place]. [facility] [place], Zelda W, NP               icosapent  Ethyl (VASCEPA ) 1 g capsule 60 capsule 0    Sig: Take 1 capsule (1 g total) by mouth 2 (two) times daily. Please schedule appt with Zelda Fleming.     Off-Protocol Failed - 04/23/2024 12:20 PM      Failed - Medication not assigned to a protocol, review manually.      Passed - Valid encounter within last 12 months    Recent Outpatient Visits           1 week ago Heart block AV complete [facility])   [place] [facility] - A Dept Of [place]. [facility] Theotis Haze ORN, NP   3 weeks ago Bradycardia   [place] [facility] [place] - A Dept Of [place]. [facility] Theotis Haze ORN, NP   9 months ago Bronchitis   [place] [facility] [place] - A Dept Of [place]. [facility] Theotis Haze ORN, NP   1  year ago Primary hypertension   [place] [facility] [place] - A Dept Of [place]. [facility] Theotis Haze ORN, NP   1 year ago Prediabetes   [place] [facility] [place] - A Dept Of [place]. [facility] [place], Zelda W, NP               albuterol  (VENTOLIN  HFA) 108 (90 Base) MCG/ACT inhaler 18 g 1    Sig: Inhale 1-2 puffs into the lungs every 6 (six) hours as needed.     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 [facility] Theotis Haze ORN, NP   3 weeks ago Bradycardia    [place] [facility] [place] - A Dept Of [place]. [facility] Theotis Haze ORN, NP   9 months ago Bronchitis   [place] [facility] [place] - A Dept Of [place]. [facility] Theotis Haze ORN, NP   1 year ago Primary hypertension   [place] [facility] [place] - A Dept Of [place]. [facility] Theotis Haze ORN, NP   1 year ago Prediabetes   [place] [facility] [place] - A Dept Of [place]. [facility] Theotis Haze ORN, [place]

## 2024-04-26 ENCOUNTER — Other Ambulatory Visit: Payer: Self-pay

## 2024-04-27 ENCOUNTER — Ambulatory Visit: Payer: Medicare HMO | Attending: Nurse Practitioner

## 2024-04-27 VITALS — Ht 68.0 in | Wt 177.0 lb

## 2024-04-27 DIAGNOSIS — Z0000 Encounter for general adult medical examination without abnormal findings: Secondary | ICD-10-CM

## 2024-04-27 NOTE — Patient Instructions (Addendum)
 Samuel Lawson , Thank you for taking time out of your busy schedule to complete your Annual Wellness Visit with me. I enjoyed our conversation and look forward to speaking with you again next year. I, as well as your care team,  appreciate your ongoing commitment to your health goals. Please review the following plan we discussed and let me know if I can assist you in the future. Your Game plan/ To Do List    Referrals: If you haven't heard from the office you've been referred to, please reach out to them at the phone provided.   Follow up Visits: We will see or speak with you next year for your Next Medicare AWV with our clinical staff Have you seen your provider in the last 6 months (3 months if uncontrolled diabetes)? Yes  Clinician Recommendations:  Aim for 30 minutes of exercise or brisk walking, 6-8 glasses of water , and 5 servings of fruits and vegetables each day.       This is a list of the screenings recommended for you:  Health Maintenance  Topic Date Due   COVID-19 Vaccine (2 - Pfizer risk series) 10/12/2020   Screening for Lung Cancer  09/11/2023   Flu Shot  04/09/2024   Zoster (Shingles) Vaccine (1 of 2) 07/14/2024*   Pneumococcal Vaccine for age over 110 (1 of 2 - PCV) 03/30/2025*   Medicare Annual Wellness Visit  04/27/2025   Hepatitis C Screening  Completed   HPV Vaccine  Aged Out   Meningitis B Vaccine  Aged Out   DTaP/Tdap/Td vaccine  Discontinued   Colon Cancer Screening  Discontinued  *Topic was postponed. The date shown is not the original due date.    Advanced directives: (Declined) Advance directive discussed with you today. Even though you declined this today, please call our office should you change your mind, and we can give you the proper paperwork for you to fill out. Advance Care Planning is important because it:  [x]  Makes sure you receive the medical care that is consistent with your values, goals, and preferences  [x]  It provides guidance to your  family and loved ones and reduces their decisional burden about whether or not they are making the right decisions based on your wishes.  Follow the link provided in your after visit summary or read over the paperwork we have mailed to you to help you started getting your Advance Directives in place. If you need assistance in completing these, please reach out to us  so that we can help you!  See attachments for Preventive Care and Fall Prevention Tips.

## 2024-04-27 NOTE — Telephone Encounter (Signed)
 Patient stated during AWV that he received a box in the mail to check his heart.  Patient is inquiring what he should do with it.  I advised patient that it sounds like a home heart monitor to wear for several days to check rhythm of heart.  Patient is confused and doesn't know who ordered it.  Please advise CB# [phone].  Jesyca Weisenburger N. Tomie, LPN [facility] Annual Wellness Team Direct Dial: [phone]

## 2024-04-27 NOTE — Progress Notes (Signed)
 Because this visit was a virtual/telehealth visit,  certain criteria was not obtained, such a blood pressure, CBG if applicable, and timed get up and go. Any medications not marked as taking were not mentioned during the medication reconciliation part of the visit. Any vitals not documented were not able to be obtained due to this being a telehealth visit or patient was unable to self-report a recent blood pressure reading due to a lack of equipment at home via telehealth. Vitals that have been documented are verbally provided by the patient.   Subjective:   Samuel Lawson is a 78 y.o. who presents for a Medicare Wellness preventive visit.  As a reminder, Annual Wellness Visits don't include a physical exam, and some assessments may be limited, especially if this visit is performed virtually. We may recommend an in-person follow-up visit with your provider if needed.  Visit Complete: Virtual I connected with  Samuel Lawson on 04/27/24 by a audio enabled telemedicine application and verified that I am speaking with the correct person using two identifiers.  Patient Location: Home  Provider Location: Home Office  I discussed the limitations of evaluation and management by telemedicine. The patient expressed understanding and agreed to proceed.  Vital Signs: Because this visit was a virtual/telehealth visit, some criteria may be missing or patient reported. Any vitals not documented were not able to be obtained and vitals that have been documented are patient reported.  VideoDeclined- This patient declined Librarian, academic. Therefore the visit was completed with audio only.  Persons Participating in Visit: Patient.  AWV Questionnaire: No: Patient Medicare AWV questionnaire was not completed prior to this visit.  Cardiac Risk Factors include: advanced age (>56men, >62 women);dyslipidemia;hypertension;male gender;smoking/ tobacco exposure;family history of  premature cardiovascular disease     Objective:    Today's Vitals   04/27/24 1035 04/27/24 1037  Weight: 177 lb (80.3 kg)   Height: 5' 8 (1.727 m)   PainSc: 0-No pain 0-No pain   Body mass index is 26.91 kg/m.     04/27/2024   10:39 AM 04/09/2024    8:31 PM 04/09/2024   12:23 PM 04/05/2024    4:00 PM 04/04/2024    8:07 PM 03/30/2024    6:18 PM 03/30/2024   12:05 PM  Advanced Directives  Does Patient Have a Medical Advance Directive? No No No No No  No  Would patient like information on creating a medical advance directive? No - Patient declined  No - Patient declined No - Patient declined  No - Patient declined     Current Medications (verified) Outpatient Encounter Medications as of 04/27/2024  Medication Sig   albuterol  (VENTOLIN  HFA) 108 (90 Base) MCG/ACT inhaler Inhale 1-2 puffs into the lungs every 6 (six) hours as needed.   aspirin  EC 81 MG tablet Take 81 mg by mouth at bedtime. Swallow whole.   atorvastatin  (LIPITOR) 40 MG tablet Take 1 tablet (40 mg total) by mouth at bedtime.   Blood Glucose Calibration (ACCU-CHEK GUIDE CONTROL) LIQD 1 each by In Vitro route once as needed for up to 1 dose. R73.03 (Patient not taking: Reported on 04/13/2024)   buPROPion  (WELLBUTRIN  SR) 150 MG 12 hr tablet Take 1 tablet (150 mg total) by mouth 2 (two) times daily.   Cholecalciferol  (VITAMIN D3) 125 MCG (5000 UT) CAPS Take 5,000 Units by mouth daily. When compliant   glucose blood (ACCU-CHEK GUIDE) test strip Use as instructed. Check blood glucose by fingerstick once per day. R73.03  icosapent  Ethyl (VASCEPA ) 1 g capsule Take 1 capsule (1 g total) by mouth 2 (two) times daily.   losartan  (COZAAR ) 25 MG tablet Take 1 tablet (25 mg total) by mouth daily. (Patient not taking: Reported on 04/13/2024)   Multiple Vitamins-Minerals (MULTIVITAMIN WITH MINERALS) tablet Take 1 tablet by mouth daily.   tamsulosin  (FLOMAX ) 0.4 MG CAPS capsule Take 2 capsules (0.8 mg total) by mouth daily.   [DISCONTINUED]  promethazine  (PHENERGAN ) 25 MG tablet Take 1 tablet (25 mg total) by mouth every 6 (six) hours as needed for nausea.   No facility-administered encounter medications on file as of 04/27/2024.    Allergies (verified) Patient has no known allergies.   History: Past Medical History:  Diagnosis Date   Acute MI (HCC) 2009   STENTS PLACED   Chronic kidney disease    Collagen vascular disease (HCC)    Hyperlipidemia    Hypertension    Pre-diabetes    Prediabetes    Past Surgical History:  Procedure Laterality Date   ANGIOPLASTY     CARDIAC CATHETERIZATION     INCISIONAL HERNIA REPAIR N/A 05/02/2022   Procedure: LAPAROSCOPIC INCISIONAL HERNIA REPAIR WITH MESH;  Surgeon: Vernetta Berg, MD;  Location: [facility];  Service: General;  Laterality: N/A;   PACEMAKER IMPLANT N/A 04/06/2024   Procedure: PACEMAKER IMPLANT;  Surgeon: Inocencio Soyla Lunger, MD;  Location: [facility];  Service: Cardiovascular;  Laterality: N/A;   ROBOTIC ASSITED PARTIAL NEPHRECTOMY Left 12/05/2021   Procedure: XI ROBOTIC ASSITED PARTIAL NEPHRECTOMY;  Surgeon: Cam Morene ORN, MD;  Location: [facility];  Service: Urology;  Laterality: Left;  3.5 HRS   Family History  Problem Relation Age of Onset   Heart Problems Mother    Stroke Father    Alzheimer's disease Father    High blood pressure Father    Colon cancer Neg Hx    Colon polyps Neg Hx    Esophageal cancer Neg Hx    Rectal cancer Neg Hx    Stomach cancer Neg Hx    Social History   Socioeconomic History   Marital status: Single    Spouse name: Not on file   Number of children: 2   Years of education: B.S.   Highest education level: Not on file  Occupational History    Employer: SELF EMPLOYED    Comment: Computers - Works for [employer]  Tobacco Use   Smoking status: Some Days    Current packs/day: 1.00    Average packs/day: 1 pack/day for 51.6 years (51.6 ttl pk-yrs)    Types: Cigarettes    Start date: 09/09/1972   Smokeless tobacco: Never    Tobacco comments:    states new years resolution is to quit smoking  Vaping Use   Vaping status: Never Used  Substance and Sexual Activity   Alcohol use: No   Drug use: No   Sexual activity: Not on file  Other Topics Concern   Not on file  Social History Narrative   Patient lives at home alone and he is single. Reports being married in the past. Patient is self employed and works with computers.   Education B.S.   Right handed.   Caffeine one cup of tea daily and two pepsi daily.   Social Drivers of Corporate investment banker Strain: Low Risk  (04/27/2024)   Overall Financial Resource Strain (CARDIA)    Difficulty of Paying Living Expenses: Not hard at all  Food Insecurity: No Food Insecurity (04/27/2024)  Hunger Vital Sign    Worried About Running Out of Food in the Last Year: Never true    Ran Out of Food in the Last Year: Never true  Transportation Needs: No Transportation Needs (04/08/2024)   PRAPARE - Administrator, Civil Service (Medical): No    Lack of Transportation (Non-Medical): No  Physical Activity: Sufficiently Active (04/27/2024)   Exercise Vital Sign    Days of Exercise per Week: 5 days    Minutes of Exercise per Session: 30 min  Stress: No Stress Concern Present (04/27/2024)   [employer] of Occupational Health - Occupational Stress Questionnaire    Feeling of Stress: Not at all  Social Connections: Moderately Isolated (04/27/2024)   Social Connection and Isolation Panel    Frequency of Communication with Friends and Family: More than three times a week    Frequency of Social Gatherings with Friends and Family: More than three times a week    Attends Religious Services: More than 4 times per year    Active Member of [employer] or Organizations: No    Attends Engineer, structural: Never    Marital Status: Divorced    Tobacco Counseling Ready to quit: Yes Counseling given: No Tobacco comments: states new years resolution is to quit  smoking    Clinical Intake:  Pre-visit preparation completed: Yes  Pain : No/denies pain Pain Score: 0-No pain     BMI - recorded: 26.91 Nutritional Status: BMI 25 -29 Overweight Nutritional Risks: None Diabetes: No  Lab Results  Component Value Date   HGBA1C 5.9 (H) 07/04/2023   HGBA1C 6.0 (H) 01/29/2023   HGBA1C 5.9 10/31/2022     How often do you need to have someone help you when you read instructions, pamphlets, or other written materials from your doctor or pharmacy?: 1 - Never  Interpreter Needed?: No  Information entered by :: Amro Winebarger N. Naima Veldhuizen, LPN.   Activities of Daily Living     04/27/2024   10:41 AM 04/05/2024    4:00 PM  In your present state of health, do you have any difficulty performing the following activities:  Hearing? 1 1  Vision? 0 0  Difficulty concentrating or making decisions? 1 0  Comment Names are hard to remember   Walking or climbing stairs? 0   Dressing or bathing? 0   Doing errands, shopping? 0 0  Preparing Food and eating ? N   Using the Toilet? N   In the past six months, have you accidently leaked urine? N   Do you have problems with loss of bowel control? N   Managing your Medications? N   Managing your Finances? N   Housekeeping or managing your Housekeeping? N     Patient Care Team: Theotis Haze ORN, NP as PCP - General (Nurse Practitioner) Ladona Heinz, MD as PCP - Cardiology (Cardiology) Cam Morene ORN, MD as Attending Physician (Urology)  I have updated your Care Teams any recent Medical Services you may have received from other providers in the past year.     Assessment:   This is a routine wellness examination for Antoinne.  Hearing/Vision screen Hearing Screening - Comments:: Bilateral hearing issues.  No hearing aids. Vision Screening - Comments:: No rx glasses - not up to date with routine eye exams.    Goals Addressed             This Visit's Progress    04/27/2024: My goal is to quit smoking,  continue to  work out at [employer], walk and eat healthy.       Quit Smoking         Depression Screen     04/27/2024   10:59 AM 04/13/2024    9:55 AM 03/30/2024   10:12 AM 07/04/2023    9:53 AM 01/29/2023    9:55 AM 10/31/2022   10:19 AM 08/30/2022    9:11 AM  PHQ 2/9 Scores  PHQ - 2 Score 0 0 1 0 0 0 0  PHQ- 9 Score 3 4 7 3 3 1      Fall Risk     04/27/2024   10:41 AM 04/13/2024    9:55 AM 03/30/2024   10:12 AM 07/04/2023    9:50 AM 04/22/2023    7:44 PM  Fall Risk   Falls in the past year? 0 0 0 0 0  Number falls in past yr: 0 0 0 0 0  Injury with Fall? 0 0 0 0 0  Risk for fall due to : No Fall Risks No Fall Risks No Fall Risks No Fall Risks No Fall Risks  Follow up Falls evaluation completed Falls evaluation completed Falls evaluation completed Falls evaluation completed Falls prevention discussed;Education provided;Falls evaluation completed    MEDICARE RISK AT HOME:  Medicare Risk at Home Any stairs in or around the home?: No If so, are there any without handrails?: No Home free of loose throw rugs in walkways, pet beds, electrical cords, etc?: Yes Adequate lighting in your home to reduce risk of falls?: Yes Life alert?: No Use of a cane, walker or w/c?: No Grab bars in the bathroom?: No Shower chair or bench in shower?: No Elevated toilet seat or a handicapped toilet?: No  TIMED UP AND GO:  Was the test performed?  No  Cognitive Function: Declined/Normal: No cognitive concerns noted by patient or family. Patient alert, oriented, able to answer questions appropriately and recall recent events. No signs of memory loss or confusion.    04/27/2024   10:42 AM 08/30/2022    9:12 AM 08/08/2021   10:21 AM 04/09/2018    2:23 PM  MMSE - Mini Mental State Exam  Not completed: Unable to complete     Orientation to time  5 5 5   Orientation to Place  5 5 5   Registration  3 3 3   Attention/ Calculation  5 5 4   Recall  3 3 2   Language- name 2 objects  2 2 2   Language- repeat  1 1 1    Language- follow 3 step command  3 3 3   Language- read & follow direction  1 1 1   Write a sentence  1 1 1   Copy design  1 0 1  Total score  30 29 28       01/02/2022   11:25 AM  Montreal Cognitive Assessment   Visuospatial/ Executive (0/5) 5  Naming (0/3) 2  Attention: Read list of digits (0/2) 1  Attention: Read list of letters (0/1) 1  Attention: Serial 7 subtraction starting at 100 (0/3) 3  Language: Repeat phrase (0/2) 1  Language : Fluency (0/1) 0  Abstraction (0/2) 2  Delayed Recall (0/5) 1  Orientation (0/6) 6  Total 22  Adjusted Score (based on education) 22      04/27/2024   10:42 AM 04/22/2023    7:44 PM 08/30/2022    9:13 AM  6CIT Screen  What Year? 0 points 0 points 0 points  What month? 0 points 0 points  0 points  What time? 0 points 0 points 0 points  Count back from 20 0 points 0 points 0 points  Months in reverse 0 points 0 points 0 points  Repeat phrase 0 points 0 points 0 points  Total Score 0 points 0 points 0 points    Immunizations Immunization History  Administered Date(s) Administered   Influenza,inj,Quad PF,6+ Mos 08/25/2013, 06/01/2014, 06/22/2015   PFIZER(Purple Top)SARS-COV-2 Vaccination 09/21/2020    Screening Tests Health Maintenance  Topic Date Due   COVID-19 Vaccine (2 - Pfizer risk series) 10/12/2020   Lung Cancer Screening  09/11/2023   INFLUENZA VACCINE  04/09/2024   Zoster Vaccines- Shingrix (1 of 2) 07/14/2024 (Originally 03/20/1965)   Pneumococcal Vaccine: 50+ Years (1 of 2 - PCV) 03/30/2025 (Originally 03/20/1965)   Medicare Annual Wellness (AWV)  04/27/2025   Hepatitis C Screening  Completed   HPV VACCINES  Aged Out   Meningococcal B Vaccine  Aged Out   DTaP/Tdap/Td  Discontinued   Colonoscopy  Discontinued    Health Maintenance  Health Maintenance Due  Topic Date Due   COVID-19 Vaccine (2 - Pfizer risk series) 10/12/2020   Lung Cancer Screening  09/11/2023   INFLUENZA VACCINE  04/09/2024   Health Maintenance  Items Addressed: Yes Patient would like to postponed Lung Cancer Screening until later.  Additional Screening:  Vision Screening: Recommended annual ophthalmology exams for early detection of glaucoma and other disorders of the eye. Would you like a referral to an eye doctor? No    Dental Screening: Recommended annual dental exams for proper oral hygiene  Community Resource Referral / Chronic Care Management: CRR required this visit?  No   CCM required this visit?  No   Plan:    I have personally reviewed and noted the following in the patient's chart:   Medical and social history Use of alcohol, tobacco or illicit drugs  Current medications and supplements including opioid prescriptions. Patient is not currently taking opioid prescriptions. Functional ability and status Nutritional status Physical activity Advanced directives List of other physicians Hospitalizations, surgeries, and ER visits in previous 12 months Vitals Screenings to include cognitive, depression, and falls Referrals and appointments  In addition, I have reviewed and discussed with patient certain preventive protocols, quality metrics, and best practice recommendations. A written personalized care plan for preventive services as well as general preventive health recommendations were provided to patient.   Roz LOISE Fuller, LPN   1/80/7974   After Visit Summary: (Declined) Due to this being a telephonic visit, with patients personalized plan was offered to patient but patient Declined AVS at this time   Notes: Patient would like to postponed Lunng Cancer Screening until later.

## 2024-04-29 ENCOUNTER — Other Ambulatory Visit: Payer: Self-pay

## 2024-04-30 ENCOUNTER — Other Ambulatory Visit: Payer: Self-pay

## 2024-05-01 ENCOUNTER — Other Ambulatory Visit: Payer: Self-pay

## 2024-05-01 ENCOUNTER — Emergency Department (HOSPITAL_COMMUNITY)
Admission: EM | Admit: 2024-05-01 | Discharge: 2024-05-01 | Disposition: A | Discharge status: Home / Self Care | Attending: Emergency Medicine | Admitting: Emergency Medicine

## 2024-05-01 ENCOUNTER — Emergency Department (HOSPITAL_COMMUNITY)

## 2024-05-01 DIAGNOSIS — R42 Dizziness and giddiness: Secondary | ICD-10-CM | POA: Diagnosis not present

## 2024-05-01 DIAGNOSIS — R112 Nausea with vomiting, unspecified: Secondary | ICD-10-CM | POA: Insufficient documentation

## 2024-05-01 DIAGNOSIS — R519 Headache, unspecified: Secondary | ICD-10-CM | POA: Diagnosis not present

## 2024-05-01 DIAGNOSIS — D696 Thrombocytopenia, unspecified: Secondary | ICD-10-CM | POA: Diagnosis not present

## 2024-05-01 DIAGNOSIS — R1013 Epigastric pain: Secondary | ICD-10-CM | POA: Diagnosis not present

## 2024-05-01 DIAGNOSIS — R531 Weakness: Secondary | ICD-10-CM | POA: Diagnosis not present

## 2024-05-01 DIAGNOSIS — D494 Neoplasm of unspecified behavior of bladder: Secondary | ICD-10-CM

## 2024-05-01 DIAGNOSIS — N400 Enlarged prostate without lower urinary tract symptoms: Secondary | ICD-10-CM | POA: Diagnosis not present

## 2024-05-01 DIAGNOSIS — Z7982 Long term (current) use of aspirin: Secondary | ICD-10-CM | POA: Insufficient documentation

## 2024-05-01 DIAGNOSIS — N281 Cyst of kidney, acquired: Secondary | ICD-10-CM | POA: Diagnosis not present

## 2024-05-01 DIAGNOSIS — Z905 Acquired absence of kidney: Secondary | ICD-10-CM | POA: Diagnosis not present

## 2024-05-01 DIAGNOSIS — R110 Nausea: Secondary | ICD-10-CM

## 2024-05-01 DIAGNOSIS — K8020 Calculus of gallbladder without cholecystitis without obstruction: Secondary | ICD-10-CM | POA: Diagnosis not present

## 2024-05-01 DIAGNOSIS — C679 Malignant neoplasm of bladder, unspecified: Secondary | ICD-10-CM | POA: Diagnosis not present

## 2024-05-01 LAB — CBC
HCT: 47.3 % (ref 39.0–52.0)
Hemoglobin: 15.3 g/dL (ref 13.0–17.0)
MCH: 31.4 pg (ref 26.0–34.0)
MCHC: 32.3 g/dL (ref 30.0–36.0)
MCV: 97.1 fL (ref 80.0–100.0)
Platelets: 115 K/uL — ABNORMAL LOW (ref 150–400)
RBC: 4.87 MIL/uL (ref 4.22–5.81)
RDW: 12.6 % (ref 11.5–15.5)
WBC: 7 K/uL (ref 4.0–10.5)
nRBC: 0 % (ref 0.0–0.2)

## 2024-05-01 LAB — URINALYSIS, ROUTINE W REFLEX MICROSCOPIC
Bilirubin Urine: NEGATIVE
Glucose, UA: NEGATIVE mg/dL
Hgb urine dipstick: NEGATIVE
Ketones, ur: NEGATIVE mg/dL
Leukocytes,Ua: NEGATIVE
Nitrite: NEGATIVE
Protein, ur: NEGATIVE mg/dL
Specific Gravity, Urine: 1.028 (ref 1.005–1.030)
pH: 6 (ref 5.0–8.0)

## 2024-05-01 LAB — RESP PANEL BY RT-PCR (RSV, FLU A&B, COVID)  RVPGX2
Influenza A by PCR: NEGATIVE
Influenza B by PCR: NEGATIVE
Resp Syncytial Virus by PCR: NEGATIVE
SARS Coronavirus 2 by RT PCR: NEGATIVE

## 2024-05-01 LAB — COMPREHENSIVE METABOLIC PANEL WITH GFR
ALT: 15 U/L (ref 0–44)
AST: 20 U/L (ref 15–41)
Albumin: 3.6 g/dL (ref 3.5–5.0)
Alkaline Phosphatase: 59 U/L (ref 38–126)
Anion gap: 6 (ref 5–15)
BUN: 19 mg/dL (ref 8–23)
CO2: 21 mmol/L — ABNORMAL LOW (ref 22–32)
Calcium: 9.1 mg/dL (ref 8.9–10.3)
Chloride: 113 mmol/L — ABNORMAL HIGH (ref 98–111)
Creatinine, Ser: 1.4 mg/dL — ABNORMAL HIGH (ref 0.61–1.24)
GFR, Estimated: 52 mL/min — ABNORMAL LOW (ref >60)
Glucose, Bld: 117 mg/dL — ABNORMAL HIGH (ref 70–99)
Potassium: 4.5 mmol/L (ref 3.5–5.1)
Sodium: 140 mmol/L (ref 135–145)
Total Bilirubin: 0.9 mg/dL (ref 0.0–1.2)
Total Protein: 6.5 g/dL (ref 6.5–8.1)

## 2024-05-01 LAB — TROPONIN I (HIGH SENSITIVITY)
Troponin I (High Sensitivity): 10 ng/L (ref <18)
Troponin I (High Sensitivity): 8 ng/L (ref <18)

## 2024-05-01 LAB — LIPASE, BLOOD: Lipase: 36 U/L (ref 11–51)

## 2024-05-01 MED ORDER — IOHEXOL 350 MG/ML SOLN
75.0000 mL | Freq: Once | INTRAVENOUS | Status: AC | PRN
  Administered 2024-05-01: 75 mL via INTRAVENOUS

## 2024-05-01 MED ORDER — SODIUM CHLORIDE 0.9 % IV BOLUS
500.0000 mL | Freq: Once | INTRAVENOUS | Status: AC
  Administered 2024-05-01: 500 mL via INTRAVENOUS

## 2024-05-01 MED ORDER — ONDANSETRON HCL 4 MG PO TABS
4.0000 mg | ORAL_TABLET | Freq: Four times a day (QID) | ORAL | 0 refills | Status: AC
Start: 2024-05-01 — End: ?
  Filled 2024-05-01 – 2024-05-03 (×2): qty 12, 3d supply, fill #0

## 2024-05-01 MED ORDER — SODIUM CHLORIDE 0.9 % IV BOLUS
1000.0000 mL | Freq: Once | INTRAVENOUS | Status: AC
  Administered 2024-05-01: 1000 mL via INTRAVENOUS

## 2024-05-01 MED ORDER — ONDANSETRON HCL 4 MG/2ML IJ SOLN
4.0000 mg | Freq: Once | INTRAMUSCULAR | Status: AC
  Administered 2024-05-01: 4 mg via INTRAVENOUS
  Filled 2024-05-01: qty 2

## 2024-05-01 NOTE — ED Notes (Signed)
 Patient given fluids for PO challenge.

## 2024-05-01 NOTE — ED Notes (Signed)
 Patient transported to CT

## 2024-05-01 NOTE — ED Provider Notes (Signed)
 [place] EMERGENCY DEPARTMENT AT [facility] Provider Note   CSN: [id number] Arrival date & time: 05/01/24  9568     Patient presents with: Dizziness and Nausea   Samuel Lawson is a 78 y.o. male.   78 year old male presents from home with complaint of feeling lightheaded for the past several days described as feeling like he is going to pass out.  This is not any different with change in position or turning his head.  He also reports that he has a headache, is vomiting, has chills and epigastric pain.  He denies unilateral weakness or numbness.  He has a Medtronic pacemaker.       Prior to Admission medications   Medication Sig Start Date End Date Taking? Authorizing Provider  albuterol  (VENTOLIN  HFA) 108 (90 Base) MCG/ACT inhaler Inhale 1-2 puffs into the lungs every 6 (six) hours as needed. 04/23/24   Fleming, Zelda W, NP  aspirin  EC 81 MG tablet Take 81 mg by mouth at bedtime. Swallow whole.    [provider]  atorvastatin  (LIPITOR) 40 MG tablet Take 1 tablet (40 mg total) by mouth at bedtime. 07/04/23   Fleming, Zelda W, NP  Blood Glucose Calibration (ACCU-CHEK GUIDE CONTROL) LIQD 1 each by In Vitro route once as needed for up to 1 dose. R73.03 Patient not taking: Reported on 04/13/2024 01/07/20   Fleming, Zelda W, NP  buPROPion  (WELLBUTRIN  SR) 150 MG 12 hr tablet Take 1 tablet (150 mg total) by mouth 2 (two) times daily. 03/30/24   Fleming, Zelda W, NP  Cholecalciferol  (VITAMIN D3) 125 MCG (5000 UT) CAPS Take 5,000 Units by mouth daily. When compliant    [provider]  glucose blood (ACCU-CHEK GUIDE) test strip Use as instructed. Check blood glucose by fingerstick once per day. R73.03 10/23/21   Newlin, Enobong, MD  icosapent  Ethyl (VASCEPA ) 1 g capsule Take 1 capsule (1 g total) by mouth 2 (two) times daily.Must have office visit for refills 04/23/24   Newlin, Enobong, MD  losartan  (COZAAR ) 25 MG tablet Take 1 tablet (25 mg total) by mouth  daily. Patient not taking: Reported on 04/13/2024 03/31/24 03/31/25  Ladona Heinz, MD  Multiple Vitamins-Minerals (MULTIVITAMIN WITH MINERALS) tablet Take 1 tablet by mouth daily.    [provider]  tamsulosin  (FLOMAX ) 0.4 MG CAPS capsule Take 2 capsules (0.8 mg total) by mouth daily. 04/20/24   Newlin, Enobong, MD  promethazine  (PHENERGAN ) 25 MG tablet Take 1 tablet (25 mg total) by mouth every 6 (six) hours as needed for nausea. 06/18/12 12/07/12  Schinlever, Dorothyann, PA-C    Allergies: Patient has no known allergies.    Review of Systems Negative except as per HPI Updated Vital Signs BP 136/75   Pulse (!) 53   Temp (!) 97.5 F (36.4 C) (Oral)   Resp 18   Ht 5' 8 (1.727 m)   Wt 80.3 kg   SpO2 99%   BMI 26.91 kg/m   Physical Exam Vitals and nursing note reviewed.  Constitutional:      General: He is not in acute distress.    Appearance: He is well-developed. He is not diaphoretic.  HENT:     Head: Normocephalic and atraumatic.     Mouth/Throat:     Mouth: Mucous membranes are dry.  Eyes:     Extraocular Movements: Extraocular movements intact.     Pupils: Pupils are equal, round, and reactive to light.  Cardiovascular:     Rate and Rhythm: Normal rate and  regular rhythm.     Heart sounds: Normal heart sounds.  Pulmonary:     Effort: Pulmonary effort is normal.     Breath sounds: Normal breath sounds.  Abdominal:     Palpations: Abdomen is soft.     Tenderness: There is no abdominal tenderness.  Musculoskeletal:     Cervical back: Neck supple.     Right lower leg: No edema.     Left lower leg: No edema.  Skin:    General: Skin is warm and dry.     Findings: No erythema or rash.  Neurological:     Mental Status: He is alert and oriented to person, place, and time.     Sensory: No sensory deficit.     Motor: No weakness.  Psychiatric:        Behavior: Behavior normal.     (all labs ordered are listed, but only abnormal results are displayed) Labs  Reviewed  COMPREHENSIVE METABOLIC PANEL WITH GFR - Abnormal; Notable for the following components:      Result Value   Chloride 113 (*)    CO2 21 (*)    Glucose, Bld 117 (*)    Creatinine, Ser 1.40 (*)    GFR, Estimated 52 (*)    All other components within normal limits  CBC - Abnormal; Notable for the following components:   Platelets 115 (*)    All other components within normal limits  RESP PANEL BY RT-PCR (RSV, FLU A&B, COVID)  RVPGX2  LIPASE, BLOOD  URINALYSIS, ROUTINE W REFLEX MICROSCOPIC  TROPONIN I (HIGH SENSITIVITY)    EKG: None  Radiology: CT Head Wo Contrast Result Date: 05/01/2024 CLINICAL DATA:  78 year old male with new onset headache, dizziness, nausea. Pacemaker. EXAM: CT HEAD WITHOUT CONTRAST TECHNIQUE: Contiguous axial images were obtained from the base of the skull through the vertex without intravenous contrast. RADIATION DOSE REDUCTION: This exam was performed according to the departmental dose-optimization program which includes automated exposure control, adjustment of the mA and/or kV according to patient size and/or use of iterative reconstruction technique. COMPARISON:  Head CT 03/16/2019.  Brain MRI 09/13/2019. FINDINGS: Brain: Cerebral volume is not significantly changed, within normal limits for age. No midline shift, ventriculomegaly, mass effect, evidence of mass lesion, intracranial hemorrhage or evidence of cortically based acute infarction. Gray-white differentiation is stable and within normal limits for age. No encephalomalacia identified. Vascular: Calcified atherosclerosis at the skull base. No suspicious intracranial vascular hyperdensity. Skull: Stable and intact.  No acute osseous abnormality identified. Sinuses/Orbits: Tympanic cavities and mastoids are clear. Minor paranasal sinus mucosal thickening. Other: Visualized orbits and scalp soft tissues are within normal limits. IMPRESSION: Stable and normal for age noncontrast CT appearance of the brain.  Minor paranasal sinus disease, significance doubtful. Electronically Signed   By: VEAR Hurst M.D.   On: 05/01/2024 05:58     Procedures   Medications Ordered in the ED  ondansetron  (ZOFRAN ) injection 4 mg (4 mg Intravenous Given 05/01/24 0609)  sodium chloride  0.9 % bolus 500 mL (500 mLs Intravenous New Bag/Given 05/01/24 0609)                                    Medical Decision Making Amount and/or Complexity of Data Reviewed Labs: ordered. Radiology: ordered.  Risk Prescription drug management.   This patient presents to the ED for concern of nausea/vomiting, abdominal pain (epigastric), this involves an extensive number of treatment  options, and is a complaint that carries with it a high risk of complications and morbidity.  The differential diagnosis includes but not limited to CVA, metabolic/electrolyte, vertigo, arrhythmia, pancreatitis, viral illness   Co morbidities / Chronic conditions that complicate the patient evaluation  HTN, HLD, MI, CKD   Additional history obtained:  Additional history obtained from EMR External records from outside source obtained and reviewed including prior labs and imaging on file    Lab Tests:  I Ordered, and personally interpreted labs.  The pertinent results include:  CBC with thrombocytopenia at 115, stable. CMP with up trending Cr, currently 1.4. Lipase WNL. Troponin 10.    Imaging Studies ordered:  I ordered imaging studies including CT head, CXR, CT ap  I independently visualized and interpreted imaging which showed imaging pending at time of signout to oncoming provider. I agree with the radiologist interpretation   Cardiac Monitoring: / EKG:  The patient was maintained on a cardiac monitor.  I personally viewed and interpreted the cardiac monitored which showed an underlying rhythm of: Ventricular paced, rate 63   Problem List / ED Course / Critical interventions / Medication management  78 year old male presents with  feeling poorly today including chills, epigastric abdominal pain and feeling lightheaded.  Labs with uptrending creatinine otherwise without significant changes.  Care signed out to oncoming provider pending second troponin, urinalysis, COVID swab, CT abdomen pelvis, CT head and pacer interrogation. I ordered medication including Zofran , IV fluids I have reviewed the patients home medicines and have made adjustments as needed   Social Determinants of Health:  Has PCP   Test / Admission - Considered:  Disposition pending at time of signout to oncoming provider      Final diagnoses:  None    ED Discharge Orders     None          Beverley Leita DELENA DEVONNA 05/01/24 0631    Griselda Norris, MD 05/02/24 [phone]

## 2024-05-01 NOTE — ED Notes (Signed)
 PA notified of patient's ongoing nausea.

## 2024-05-01 NOTE — Discharge Instructions (Addendum)
 As discussed, please follow-up with your urology appointment on Monday, continue take your nausea medicine as needed, important to continue to hydrate to prevent you from becoming dehydrated again.  Please return to the emergency room should you be unable to keep down fluids, feel weak, dizzy, lightheaded, or have any other further concerns.  Otherwise please follow-up as scheduled.

## 2024-05-01 NOTE — ED Notes (Signed)
 Ginger ale given

## 2024-05-01 NOTE — ED Triage Notes (Signed)
 Pt arrives POV with complaints of dizziness and feeling lightheaded which started last night along with nausea. Pt has medtronic pacemaker that was placed in the beginning of the month and states he hasn't had any issues with it or chest pain. Denies abd pain and states he feels nauseous but has not actually vomited.

## 2024-05-01 NOTE — ED Provider Notes (Signed)
  Physical Exam  BP 136/75   Pulse (!) 53   Temp (!) 97.5 F (36.4 C) (Oral)   Resp 18   Ht 5' 8 (1.727 m)   Wt 80.3 kg   SpO2 99%   BMI 26.91 kg/m   Physical Exam  Procedures  Procedures  ED Course / MDM    Medical Decision Making Amount and/or Complexity of Data Reviewed Labs: ordered. Radiology: ordered.  Risk Prescription drug management.   Presented w/ dizziness, near-syncope, chills, epigastric pain, n/v.  No hx of vertigo, non-positional sx, no neuro deficits.  Awaiting CT head, CXR, CT abd/pel.  Elevated creatinine.  Getting IVF, awaiting Resp Panel.  Dispo pending results.   Review of imaging obtained of the head did not show any acute findings.  Same with the chest x-ray which showed some chronic interstitial findings however nothing acute.  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  Discussed this with the patient and he states that he has an appointment with urology on this coming Monday, 25 August.  As such, we will have patient follow-up with urology as an outpatient for continued following of this new finding.  Regarding his nausea, he was able to tolerate oral fluids without difficulty after getting IV Zofran .  As such we will continue outpatient course of ondansetron , encourage patient to drink copious amounts of liquids, and follow-up with his planned appointment on Monday.  Lab workup did demonstrate AKI, and he had received 1.5 L of fluid for the same.  At this time, vital signs are stable, he tolerates oral intake without difficulty, has some mild persistent nausea however this is controlled with ondansetron .  As such finding you stable for discharge with outpatient follow-up.      Myriam Dorn BROCKS, PA 05/01/24 [id number]    Charlyn Sora, MD 05/02/24 [id number]

## 2024-05-03 ENCOUNTER — Other Ambulatory Visit: Payer: Self-pay

## 2024-05-03 DIAGNOSIS — C642 Malignant neoplasm of left kidney, except renal pelvis: Secondary | ICD-10-CM | POA: Diagnosis not present

## 2024-05-03 DIAGNOSIS — D414 Neoplasm of uncertain behavior of bladder: Secondary | ICD-10-CM | POA: Diagnosis not present

## 2024-05-21 ENCOUNTER — Ambulatory Visit

## 2024-05-21 DIAGNOSIS — I441 Atrioventricular block, second degree: Secondary | ICD-10-CM | POA: Diagnosis not present

## 2024-05-25 LAB — CUP PACEART REMOTE DEVICE CHECK
Battery Remaining Longevity: 151 mo
Battery Voltage: 3.2 V
Brady Statistic AP VP Percent: 38.59 %
Brady Statistic AP VS Percent: 0.12 %
Brady Statistic AS VP Percent: 60.07 %
Brady Statistic AS VS Percent: 1.21 %
Brady Statistic RA Percent Paced: 38.62 %
Brady Statistic RV Percent Paced: 98.66 %
Date Time Interrogation Session: 20250916124931
Implantable Lead Connection Status: 753985
Implantable Lead Connection Status: 753985
Implantable Lead Implant Date: 20250729
Implantable Lead Implant Date: 20250729
Implantable Lead Location: 753859
Implantable Lead Location: 753860
Implantable Lead Model: 3830
Implantable Lead Model: 5076
Implantable Pulse Generator Implant Date: 20250729
Lead Channel Impedance Value: 304 Ohm
Lead Channel Impedance Value: 399 Ohm
Lead Channel Impedance Value: 456 Ohm
Lead Channel Impedance Value: 570 Ohm
Lead Channel Pacing Threshold Amplitude: 0.5 V
Lead Channel Pacing Threshold Amplitude: 0.875 V
Lead Channel Pacing Threshold Pulse Width: 0.4 ms
Lead Channel Pacing Threshold Pulse Width: 0.4 ms
Lead Channel Sensing Intrinsic Amplitude: 1.25 mV
Lead Channel Sensing Intrinsic Amplitude: 1.25 mV
Lead Channel Sensing Intrinsic Amplitude: 15.25 mV
Lead Channel Sensing Intrinsic Amplitude: 15.25 mV
Lead Channel Setting Pacing Amplitude: 1.5 V
Lead Channel Setting Pacing Amplitude: 2 V
Lead Channel Setting Pacing Pulse Width: 0.4 ms
Lead Channel Setting Sensing Sensitivity: 0.9 mV
Zone Setting Status: 755011

## 2024-05-26 ENCOUNTER — Encounter: Payer: Self-pay | Admitting: Cardiology

## 2024-05-26 NOTE — Progress Notes (Signed)
 PERIOPERATIVE PRESCRIPTION FOR IMPLANTED CARDIAC DEVICE PROGRAMMING  Patient Information: Name:  Samuel Lawson  DOB:  [birth year]  MRN:  [id number]    Planned Procedure:  Laparoscopic possible open incisional hernia repair with Mesh  Surgeon:  Durand Vicenta Poli  Date of Procedure:  05-27-24  Cautery will be used.  Position during surgery:  supine   Please send documentation back to:  [facility] (Fax # [phone])  Device Information:  Clinic EP Physician:  Soyla Norton, MD   Device Type:  Pacemaker Manufacturer and Phone #:  Medtronic: [phone] Pacemaker Dependent?:  Yes.   Date of Last Device Check:  05/25/2024  Normal Device Function?:  Yes.    Electrophysiologist's Recommendations:  Have magnet available. Provide continuous ECG monitoring when magnet is used or reprogramming is to be performed.  Procedure may interfere with device function.  Magnet should be placed over device during procedure.  Per Device Clinic Standing Orders, Almarie ONEIDA Shutter, RN  10:47 AM 05/26/2024 PERIOPERATIVE PRESCRIPTION FOR IMPLANTED CARDIAC DEVICE PROGRAMMING

## 2024-05-26 NOTE — Addendum Note (Signed)
 Encounter addended by: Malvina Pina A on: 05/26/2024 10:38 AM  Actions taken: Imaging Exam ended

## 2024-05-26 NOTE — Progress Notes (Signed)
 Anesthesia Chart Review:  78 year old male with pertinent history including current smoker, collagen vascular disease, HTN, HLD, CAD s/p stent to RCA in 2001 and in 2009. Patient underwent stenting of 90% stenosis with thrombus in the distal RCA on 01/31/2000.  Repeat cardiac catheterization on 11/14/2007 by Dr. Herminio revealed 30% mid LAD lesion, total occlusion of proximal RCA with visible thrombus, EF 60% was mild inferoapical hypokinesis, he was treated with balloon angioplasty and a 3.0 x 18 mm stent, distal RCA had a 40% in-stent restenosis, there was 50% mid RCA stenosis.   Patient was recently evaluated by cardiology for bradycardia and dizziness. He was sent to the [facility] ED on 03/31/2024 from PCPs office due to bradycardia with heart rate down to the 30s.  He was on 3.125 mg twice a day of carvedilol  at the time, this was held during the hospitalization.  Dr. Ladona curb sided EP service who felt patient did not need pacemaker at the time.  He had Mobitz type I heart block on telemetry during the hospitalization.  Echocardiogram obtained on 03/31/2024 showed EF 55 to 60%, no significant valve issue.  He was ultimately discharged on 2-week heart monitor.  He was subsequently noted to have multiple episodes of nocturnal CHB which were not felt to warrant intervention.  However, on the morning of 04/04/2024 he developed bradycardia and intermittent CHB while awake which was associated with dizziness.  He was advised to present to the ED and ultimately had Medtronic dual-chamber PPM implanted on 04/06/2024 by Dr. Inocencio.  He was seen in follow-up on 04/20/2024 for pacemaker wound check.  Wound was noted to be well healed, routine testing showed normal function.  Subsequent remote device checks have been normal.  His next scheduled follow-up with Dr. Inocencio is on 07/19/2024.  History reviewed with anesthesiologist Dr. Maryclare, advised okay to proceed as planned barring acute status change.  Patient will  need day of surgery labs and evaluation.  EKG 05/01/2024: Ventricular paced rhythm.  Rate 63.  Perioperative prescription for plan to cardiac device programming per progress note 05/26/2024: Device Information:   Clinic EP Physician:  Soyla Inocencio, MD    Device Type:  Pacemaker Manufacturer and Phone #:  Medtronic: [id number] Pacemaker Dependent?:  Yes.   Date of Last Device Check:  05/25/2024      Normal Device Function?:  Yes.     Electrophysiologist's Recommendations:   Have magnet available. Provide continuous ECG monitoring when magnet is used or reprogramming is to be performed.  Procedure may interfere with device function.  Magnet should be placed over device during procedure.    Lynwood Geofm [facility]IG[facility] Short Stay Center/Anesthesiology Phone [phone] 05/26/2024 4:32 PM

## 2024-05-26 NOTE — H&P (Signed)
 PROVIDER: VICENTA DASIE POLI, MD  MRN: [id number] DOB: 07/07/1946  Subjective   Chief Complaint: Follow-up (Hernia LTF, Discuss CT results/)   History of Present Illness: Samuel Lawson is a 78 y.o. male who is seen for a long-term follow-up regarding his incisional hernia. Again, this is a recurrent hernia. His original hernia occurred after robotic kidney surgery. I performed a laparoscopic incisional hernia repair with mesh on him in August 2023 for a 6 cm fascial defect. The hernia was recurred. He continues to smoke and has not been able to lose any weight. His weight today is 188 pounds. He has pain from the hernia but no obstructive symptoms. A CT scan in November of last year showed a contained omentum and transverse colon    Review of Systems: A complete review of systems was obtained from the patient. I have reviewed this information and discussed as appropriate with the patient. See HPI as well for other ROS.  ROS   Medical History: Past Medical History:  Diagnosis Date  Anxiety  Arthritis  GERD (gastroesophageal reflux disease)  Hyperlipidemia  Hypertension   Patient Active Problem List  Diagnosis  Anxiety  BPH (benign prostatic hyperplasia)  Cholesteatoma of right external auditory canal  Chronic swimmer's ear of both sides  Collagen vascular disease (CMS/HHS-HCC)  Coronary artery disease involving native coronary artery of native heart without angina pectoris  Disequilibrium  Dyshidrotic hand dermatitis  Essential hypertension  Eustachian tube dysfunction, bilateral  Gastroesophageal reflux disease without esophagitis  Hyperlipidemia LDL goal <100  Incisional hernia  Left arm pain  Left medial knee pain  Memory loss  Nicotine  dependence  Obesity (BMI 30-39.9), unspecified  Orthostatic hypotension  Prediabetes  Renal cell cancer, left (CMS/HHS-HCC)  Thrombocytopenia ()  Upper respiratory tract infection  Dizziness   Past Surgical History:   Procedure Laterality Date  HERNIA REPAIR 2023    No Known Allergies  Current Outpatient Medications on File Prior to Visit  Medication Sig Dispense Refill  albuterol  MDI, PROVENTIL , VENTOLIN , PROAIR , HFA 90 mcg/actuation inhaler Inhale 1-2 inhalations into the lungs  aspirin  81 MG EC tablet Take by mouth  buPROPion  (WELLBUTRIN  SR) 150 MG SR tablet Take 150 mg by mouth 2 (two) times daily  carvediloL  (COREG ) 3.125 MG tablet Take by mouth  icosapent  ethyL (VASCEPA ) 1 gram capsule Take 1 g by mouth  omega-3 acid ethyl esters (LOVAZA ) 1 gram capsule Take 1 g by mouth 2 (two) times daily  tamsulosin  (FLOMAX ) 0.4 mg capsule Take 2 capsules by mouth once daily  atorvastatin  (LIPITOR) 40 MG tablet Take 40 mg by mouth at bedtime   No current facility-administered medications on file prior to visit.   History reviewed. No pertinent family history.   Social History   Tobacco Use  Smoking Status Never  Smokeless Tobacco Never    Social History   Socioeconomic History  Marital status: Divorced  Tobacco Use  Smoking status: Never  Smokeless tobacco: Never  Vaping Use  Vaping status: Never Used  Substance and Sexual Activity  Alcohol use: Not Currently  Drug use: Never   Social Drivers of Health   Financial Resource Strain: Low Risk (04/22/2023)  Received from [facility]  Overall Financial Resource Strain (CARDIA)  Difficulty of Paying Living Expenses: Not hard at all  Food Insecurity: No Food Insecurity (04/22/2023)  Received from [facility]  Hunger Vital Sign  Within the past 12 months, you worried that your food would run out before you got the money to  buy more.: Never true  Within the past 12 months, the food you bought just didn't last and you didn't have money to get more.: Never true  Transportation Needs: No Transportation Needs (04/22/2023)  Received from [facility] - Transportation  Lack of Transportation (Medical): No  Lack of Transportation  (Non-Medical): No  Physical Activity: Insufficiently Active (04/22/2023)  Received from [facility]  Exercise Vital Sign  On average, how many days per week do you engage in moderate to strenuous exercise (like a brisk walk)?: 2 days  On average, how many minutes do you engage in exercise at this level?: 30 min  Stress: No Stress Concern Present (07/04/2023)  Received from [facility] - Occupational Stress Questionnaire  Feeling of Stress : Not at all  Social Connections: Moderately Isolated (04/22/2023)  Received from [facility]  Social Connection and Isolation Panel  In a typical week, how many times do you talk on the phone with family, friends, or neighbors?: Once a week  How often do you get together with friends or relatives?: Three times a week  How often do you attend church or religious services?: Never  Do you belong to any clubs or organizations such as church groups, unions, fraternal or athletic groups, or school groups?: Yes  How often do you attend meetings of the clubs or organizations you belong to?: Never  Are you married, widowed, divorced, separated, never married, or living with a partner?: Divorced   Objective:   Vitals:   BP: 120/77  Pulse: 65  Temp: 36.5 C (97.7 F)  SpO2: 98%  Weight: 85.5 kg (188 lb 6.4 oz)  Height: 170.2 cm (5' 7)  PainSc: 0-No pain  PainLoc: Abdomen   Body mass index is 29.51 kg/m.  Physical Exam   He appears well on exam.  His abdomen is soft. Again there is a ventral incisional hernia which is reducible. It is in the upper abdomen. The hernia defect is approximately 4 cm.  Labs, Imaging and Diagnostic Testing:  I reviewed his notes in electronic medical records.  Assessment and Plan:   Diagnoses and all orders for this visit:  Recurrent ventral incisional hernia    Again, he has a recurrent hernia. He is very interested in hernia repair given his discomfort. He has continue  to try to quit smoking and is smoking much less. He is not having luck in losing weight. We again discussed proceeding with the hernia repair with mesh. I again discussed both the laparoscopic and open techniques. We discussed the risks which includes but is not limited to bleeding, infection especially given his weight and smoking, injury to surrounding structures, hernia recurrence, cardiopulmonary issues regarding surgery and his smoking, DVT formation, etc. After discussion, he wishes to proceed with a laparoscopic, possible open incisional hernia repair with mesh. Surgery will be scheduled

## 2024-05-27 ENCOUNTER — Ambulatory Visit (HOSPITAL_COMMUNITY): Admission: RE | Admit: 2024-05-27 | Source: Home / Self Care | Admitting: Surgery

## 2024-05-27 ENCOUNTER — Telehealth: Payer: Self-pay

## 2024-05-27 ENCOUNTER — Encounter (HOSPITAL_COMMUNITY): Payer: Self-pay | Admitting: Physician Assistant

## 2024-05-27 ENCOUNTER — Ambulatory Visit: Payer: Self-pay | Admitting: Cardiology

## 2024-05-27 ENCOUNTER — Encounter (HOSPITAL_COMMUNITY): Admission: RE | Payer: Self-pay | Source: Home / Self Care

## 2024-05-27 DIAGNOSIS — R55 Syncope and collapse: Secondary | ICD-10-CM

## 2024-05-27 SURGERY — REPAIR, HERNIA, INCISIONAL, LAPAROSCOPIC
Anesthesia: General

## 2024-05-27 NOTE — Progress Notes (Signed)
 Patient needs urgent EP consult but not stat, patient has had a curbside discussion with Dr. Deirdre Furbish, I am CC him on this. Event monitor placed for syncope. Asymptomatic complete heart block however he also had Mobitz II AV block which was triggered by patient.

## 2024-05-27 NOTE — Telephone Encounter (Signed)
 Staff message was sent to our EP scheduling team to help get patient scheduled for a IN OFFICE VISIT with our EP providers

## 2024-05-27 NOTE — Telephone Encounter (Signed)
   Name: Samuel Lawson  DOB: 1945/12/25  MRN: [id number]  Primary Cardiologist: Gordy Bergamo, MD  Chart reviewed as part of pre-operative protocol coverage. Because of Samuel Lawson's past medical history and time since last visit, he will require a follow-up in-office visit in order to better assess preoperative cardiovascular risk.  Pre-op  covering staff: - Please schedule appointment and call patient to inform them. If patient already had an upcoming appointment within acceptable timeframe, please add pre-op  clearance to the appointment notes so provider is aware. - Please contact requesting surgeon's office via preferred method (i.e, phone, fax) to inform them of need for appointment prior to surgery.  He has a remote history of PCI (year unknown).  As long as he is asymptomatic at the time of office visit should be fine to hold ASA x 5 to 7 days prior to procedure and resume when medically safe to do so.  [name], PA-C  05/27/2024, 11:07 AM

## 2024-05-27 NOTE — Telephone Encounter (Signed)
   Pre-operative Risk Assessment    Patient Name: Samuel Lawson  DOB: Jan 21, 1946 MRN: [id number]   Date of last office visit: 07/06/20 Date of next office visit: 07/19/24   Request for Surgical Clearance    Procedure:  repair hernia incisional laparoscopic   Date of Surgery:  Clearance TBD                                 Surgeon:  Vicenta Poli, MD  Surgeon's Group or Practice Name:  [facility]  Phone number:  [phone] Fax number:  [phone]   Type of Clearance Requested:   - Medical  - Pharmacy:  Hold Aspirin  Not indicated    Type of Anesthesia:  General    Additional requests/questions:    SignedRebeca Blight   05/27/2024, 9:52 AM

## 2024-05-27 NOTE — Telephone Encounter (Signed)
 Patient is scheduled with Dr. Inocencio on 07/19/2024.

## 2024-05-28 NOTE — Progress Notes (Signed)
 Remote PPM Transmission

## 2024-05-28 NOTE — Progress Notes (Signed)
 Will, sorry I sent the report to Gus by mistake

## 2024-05-29 NOTE — Progress Notes (Signed)
 No, sorry I completely missed this. I see after the live event monitor, he got paced. Wonder why I got the final report so late. All Good

## 2024-05-31 ENCOUNTER — Ambulatory Visit: Payer: Self-pay | Admitting: Cardiology

## 2024-05-31 DIAGNOSIS — D414 Neoplasm of uncertain behavior of bladder: Secondary | ICD-10-CM | POA: Diagnosis not present

## 2024-05-31 DIAGNOSIS — H0102B Squamous blepharitis left eye, upper and lower eyelids: Secondary | ICD-10-CM | POA: Diagnosis not present

## 2024-05-31 DIAGNOSIS — H353132 Nonexudative age-related macular degeneration, bilateral, intermediate dry stage: Secondary | ICD-10-CM | POA: Diagnosis not present

## 2024-05-31 DIAGNOSIS — H43813 Vitreous degeneration, bilateral: Secondary | ICD-10-CM | POA: Diagnosis not present

## 2024-05-31 DIAGNOSIS — H2513 Age-related nuclear cataract, bilateral: Secondary | ICD-10-CM | POA: Diagnosis not present

## 2024-05-31 DIAGNOSIS — H0102A Squamous blepharitis right eye, upper and lower eyelids: Secondary | ICD-10-CM | POA: Diagnosis not present

## 2024-06-01 ENCOUNTER — Ambulatory Visit: Attending: Internal Medicine

## 2024-06-01 ENCOUNTER — Telehealth: Payer: Self-pay | Admitting: Physician Assistant

## 2024-06-01 DIAGNOSIS — I442 Atrioventricular block, complete: Secondary | ICD-10-CM

## 2024-06-01 NOTE — Progress Notes (Signed)
 Patient brought in to clinic today after stating he was feeling skipped beats when feeling his pulse.  Concerned his device wasn't working appropriately.  Also states that his device site is red and painful.  (Date of implant: 04/06/24, dual chamber PPM for CHB). Patient is very anxious.   Device site: Wound has healed within normal limits, wound edges well approximated.  There is no redness to skin surrounding device site, no swelling and no signs of infection.  The redness patient referred to was new, pink healed tissue over incision line.  Area surrounding device and under patient's arm is still tender, especially with certain movements or laying on his left side.  Explained to patient that he may be tender for some time as the tissues are still healing; however, it should get better.  Monitoring instructions given should the area start to swell or become red and feverish, he should call our office. Device clinic number given.   Device interrogation today in clinic:  Normal device function. No episodes,  HG's show evidence of some atrial events.  Threshold, sensing and impedances all stable per trends, no concerns for lead issues.   Of note:  During testing, did note frequent periods of short runs of non sustained atrial tach. Also noted some PAC's.  Likely contributing to symptoms of skipped beats.  Explained to patient.  He is not on any rate control medication.  I will follow up with Dr. Inocencio to make him aware and if any further recommendations.   No changes needed to device programming today.

## 2024-06-01 NOTE — Telephone Encounter (Signed)
 Device Transmission Reviewed:  Normal Device Function No Episodes Presenting is: AS/VP 85 Thresholds, sensing and Impedances all normal, no signs of loss of capture. ' Nothing that correlates with skipped beats on device.  ? Is he having some ectopy that he is feeling, unsure not seeing any events correlating on device.   Patient does report that his wound site is not healing it is very red, itching and painful.  Denies fever, chills or any swelling or drainage from site.  Cannot send a picture for review, must come in for eval.  Will see in Device Clinic at 330pm this afternoon to evaluate.

## 2024-06-01 NOTE — Telephone Encounter (Signed)
 I received a call to the cath lab from the patient stating he is feeling his heart skipping beats. Recent PPM placement.   Reaching out to see if someone can call him and walk him through a manual transmission and also get an appt this week in clinic with EP.   Thank you! Angie

## 2024-06-02 ENCOUNTER — Telehealth: Payer: Self-pay

## 2024-06-02 ENCOUNTER — Other Ambulatory Visit (HOSPITAL_COMMUNITY): Payer: Self-pay

## 2024-06-02 DIAGNOSIS — I4719 Other supraventricular tachycardia: Secondary | ICD-10-CM

## 2024-06-02 MED ORDER — METOPROLOL SUCCINATE ER 50 MG PO TB24
50.0000 mg | ORAL_TABLET | Freq: Every day | ORAL | 6 refills | Status: AC
  Filled 2024-06-02 – 2024-06-04 (×2): qty 30, 30d supply, fill #0
  Filled 2024-06-30: qty 30, 30d supply, fill #1
  Filled 2024-07-28 – 2024-08-06 (×2): qty 30, 30d supply, fill #2
  Filled 2024-09-03: qty 30, 30d supply, fill #3
  Filled 2024-09-30: qty 30, 30d supply, fill #4

## 2024-06-02 NOTE — Telephone Encounter (Signed)
 Per Dr. Inocencio response:  Start patient on Toprol  XL 50mg  po daily.   Notified patient or new medication and to start by taking it in the evening.  Also recommended he keep an eye on his blood pressure; however, his BP's at recent OV appear to run 112 or greater and not too concerned for hypotension.   Patient verbalizes understanding and requests I send R/X  to [facility].   RX sent.

## 2024-06-02 NOTE — Telephone Encounter (Signed)
-----   Message from Will [employer] sent at 06/02/2024  7:39 AM EDT ----- Start toprol  xl 50 mg daily ----- Message ----- From: Gershon Alan BROCKS, RN Sent: 06/01/2024   4:23 PM EDT To: Soyla Gladis Norton, MD; Maeola LITTIE Domino, RN  Patient experiencing feeling of skipped beats. During device interrogation in office noted frequent short non sustained runs of atrial tach and PAC's.  He is not on any rate control medication at present.  New PPM implant on 04/06/24 for CHB.  Any recommendations?

## 2024-06-04 ENCOUNTER — Other Ambulatory Visit: Payer: Self-pay

## 2024-06-04 ENCOUNTER — Other Ambulatory Visit (HOSPITAL_COMMUNITY): Payer: Self-pay

## 2024-06-07 ENCOUNTER — Other Ambulatory Visit: Payer: Self-pay | Admitting: Urology

## 2024-06-14 ENCOUNTER — Encounter (HOSPITAL_COMMUNITY)
Admission: RE | Admit: 2024-06-14 | Discharge: 2024-06-14 | Disposition: A | Discharge status: Home / Self Care | Source: Ambulatory Visit | Attending: Urology | Admitting: Urology

## 2024-06-14 ENCOUNTER — Other Ambulatory Visit: Payer: Self-pay

## 2024-06-14 ENCOUNTER — Encounter (HOSPITAL_COMMUNITY): Payer: Self-pay

## 2024-06-14 ENCOUNTER — Encounter: Payer: Self-pay | Admitting: Cardiology

## 2024-06-14 VITALS — BP 107/78 | HR 73 | Temp 98.1°F | Resp 18 | Ht 66.0 in | Wt 183.4 lb

## 2024-06-14 DIAGNOSIS — I2510 Atherosclerotic heart disease of native coronary artery without angina pectoris: Secondary | ICD-10-CM | POA: Diagnosis not present

## 2024-06-14 DIAGNOSIS — Z01812 Encounter for preprocedural laboratory examination: Secondary | ICD-10-CM | POA: Insufficient documentation

## 2024-06-14 DIAGNOSIS — Z01818 Encounter for other preprocedural examination: Secondary | ICD-10-CM | POA: Diagnosis present

## 2024-06-14 LAB — BASIC METABOLIC PANEL WITH GFR
Anion gap: 10 (ref 5–15)
BUN: 18 mg/dL (ref 8–23)
CO2: 25 mmol/L (ref 22–32)
Calcium: 9.2 mg/dL (ref 8.9–10.3)
Chloride: 104 mmol/L (ref 98–111)
Creatinine, Ser: 1.27 mg/dL — ABNORMAL HIGH (ref 0.61–1.24)
GFR, Estimated: 59 mL/min — ABNORMAL LOW (ref >60)
Glucose, Bld: 106 mg/dL — ABNORMAL HIGH (ref 70–99)
Potassium: 4.4 mmol/L (ref 3.5–5.1)
Sodium: 138 mmol/L (ref 135–145)

## 2024-06-14 LAB — CBC
HCT: 46.6 % (ref 39.0–52.0)
Hemoglobin: 15 g/dL (ref 13.0–17.0)
MCH: 31.3 pg (ref 26.0–34.0)
MCHC: 32.2 g/dL (ref 30.0–36.0)
MCV: 97.3 fL (ref 80.0–100.0)
Platelets: 136 K/uL — ABNORMAL LOW (ref 150–400)
RBC: 4.79 MIL/uL (ref 4.22–5.81)
RDW: 12.7 % (ref 11.5–15.5)
WBC: 8.1 K/uL (ref 4.0–10.5)
nRBC: 0 % (ref 0.0–0.2)

## 2024-06-14 NOTE — Progress Notes (Signed)
 PERIOPERATIVE PRESCRIPTION FOR IMPLANTED CARDIAC DEVICE PROGRAMMING  Patient Information: Name:  Samuel Lawson  DOB:  March 11, 1946  MRN:  [id number]  Planned Procedure:  TURBT with chemo instillation  Surgeon:  Dr. Cam  Date of Procedure:  06/16/24  Cautery will be used.  Position during surgery:  unknown   Device Information:  Clinic EP Physician:  Soyla Norton, MD   Device Type:  Pacemaker Manufacturer and Phone #:  Medtronic: [phone] Pacemaker Dependent?:  No. Date of Last Device Check:  06/01/2024 Normal Device Function?:  Yes.    Electrophysiologist's Recommendations:  Have magnet available. Provide continuous ECG monitoring when magnet is used or reprogramming is to be performed.  Procedure may interfere with device function.  Magnet should be placed over device during procedure.  Per Device Clinic Standing Orders, Delon DELENA Sharps, RN  4:55 PM 06/14/2024

## 2024-06-14 NOTE — Progress Notes (Addendum)
 COVID Vaccine Completed: yes  Date of COVID positive in last 90 days:  PCP - Haze Servant, NP Cardiologist - Gordy Bergamo, MD  Chest x-ray - 05/01/24 Epic EKG - 05/03/24 Epic Stress Test - N/A ECHO - 03/31/24 Epic Cardiac Cath - 2009 Pacemaker/ICD device last checked: 05/25/24 Epic- orders requested Spinal Cord Stimulator:N/A  Bowel Prep - N/A  Sleep Study - N/A CPAP -   Fasting Blood Sugar - preDM Checks Blood Sugar _____ times a day  Last dose of GLP1 agonist-  N/A GLP1 instructions:  Do not take after     Last dose of SGLT-2 inhibitors-  N/A SGLT-2 instructions:  Do not take after     Blood Thinner Instructions: N/A Last dose:   Time: Aspirin  Instructions: ASA 81, hold 7 days Last Dose:  Activity level: Can go up a flight of stairs and perform activities of daily living without stopping and without symptoms of chest pain or shortness of breath.   Anesthesia review: CAD x2 stents, HTN, heart block, thrombocytopenia, pacemaker, recent skipped beats  Patient denies shortness of breath, fever, cough and chest pain at PAT appointment  Patient verbalized understanding of instructions that were given to them at the PAT appointment. Patient was also instructed that they will need to review over the PAT instructions again at home before surgery.

## 2024-06-14 NOTE — Patient Instructions (Signed)
 SURGICAL WAITING ROOM VISITATION  Patients having surgery or a procedure may have no more than 2 support people in the waiting area - these visitors may rotate.    Children under the age of 34 must have an adult with them who is not the patient.  Visitors with respiratory illnesses are discouraged from visiting and should remain at home.  If the patient needs to stay at the hospital during part of their recovery, the visitor guidelines for inpatient rooms apply. Pre-op  nurse will coordinate an appropriate time for 1 support person to accompany patient in pre-op .  This support person may not rotate.    Please refer to the [facility] website for the visitor guidelines for Inpatients (after your surgery is over and you are in a regular room).    Your procedure is scheduled on: 06/16/24   Report to [facility] Main Entrance    Report to admitting at 1:35 PM   Call this number if you have problems the morning of surgery [phone]   Do not eat food or drink liquids :After Midnight.          If you have questions, please contact your surgeon's office.   FOLLOW BOWEL PREP AND ANY ADDITIONAL PRE OP INSTRUCTIONS YOU RECEIVED FROM YOUR SURGEON'S OFFICE!!!     Oral Hygiene is also important to reduce your risk of infection.                                    Remember - BRUSH YOUR TEETH THE MORNING OF SURGERY WITH YOUR REGULAR TOOTHPASTE  DENTURES WILL BE REMOVED PRIOR TO SURGERY PLEASE DO NOT APPLY Poly grip OR ADHESIVES!!!   Do NOT smoke after Midnight   Stop all vitamins and herbal supplements 7 days before surgery.   Take these medicines the morning of surgery with A SIP OF WATER : Inhalers, Bupropion , Metoprolol , Tamsulosin   DO NOT TAKE ANY ORAL DIABETIC MEDICATIONS DAY OF YOUR SURGERY  Bring CPAP mask and tubing day of surgery.                              You may not have any metal on your body including jewelry, and body piercing             Do not wear lotions,  powders, cologne, or deodorant              Men may shave face and neck.   Do not bring valuables to the hospital. [facility] IS NOT             RESPONSIBLE   FOR VALUABLES.   Contacts, glasses, dentures or bridgework may not be worn into surgery.  DO NOT BRING YOUR HOME MEDICATIONS TO THE HOSPITAL. PHARMACY WILL DISPENSE MEDICATIONS LISTED ON YOUR MEDICATION LIST TO YOU DURING YOUR ADMISSION IN THE HOSPITAL!    Patients discharged on the day of surgery will not be allowed to drive home.  Someone NEEDS to stay with you for the first 24 hours after anesthesia.   Special Instructions: Bring a copy of your healthcare power of attorney and living will documents the day of surgery if you haven't scanned them before.              Please read over the following fact sheets you were given: IF YOU HAVE QUESTIONS ABOUT YOUR PRE-OP  INSTRUCTIONS  PLEASE CALL [phone].   If you received a COVID test during your pre-op  visit  it is requested that you wear a mask when out in public, stay away from anyone that may not be feeling well and notify your surgeon if you develop symptoms. If you test positive for Covid or have been in contact with anyone that has tested positive in the last 10 days please notify you surgeon.    [facility] - Preparing for Surgery Before surgery, you can play an important role.  Because skin is not sterile, your skin needs to be as free of germs as possible.  You can reduce the number of germs on your skin by washing with CHG (chlorahexidine gluconate) soap before surgery.  CHG is an antiseptic cleaner which kills germs and bonds with the skin to continue killing germs even after washing. Please DO NOT use if you have an allergy to CHG or antibacterial soaps.  If your skin becomes reddened/irritated stop using the CHG and inform your nurse when you arrive at Short Stay. Do not shave (including legs and underarms) for at least 48 hours prior to the first CHG shower.  You  may shave your face/neck.  Please follow these instructions carefully:  1.  Shower with CHG Soap the night before surgery and the morning of surgery.  2.  If you choose to wash your hair, wash your hair first as usual with your normal  shampoo.  3.  After you shampoo, rinse your hair and body thoroughly to remove the shampoo.                             4.  Use CHG as you would any other liquid soap.  You can apply chg directly to the skin and wash.  Gently with a scrungie or clean washcloth.  5.  Apply the CHG Soap to your body ONLY FROM THE NECK DOWN.   Do   not use on face/ open                           Wound or open sores. Avoid contact with eyes, ears mouth and   genitals (private parts).                       Wash face,  Genitals (private parts) with your normal soap.             6.  Wash thoroughly, paying special attention to the area where your    surgery  will be performed.  7.  Thoroughly rinse your body with warm water  from the neck down.  8.  DO NOT shower/wash with your normal soap after using and rinsing off the CHG Soap.                9.  Pat yourself dry with a clean towel.            10.  Wear clean pajamas.            11.  Place clean sheets on your bed the night of your first shower and do not  sleep with pets. Day of Surgery : Do not apply any CHG, lotions/deodorants the morning of surgery.  Please wear clean clothes to the hospital/surgery center.  FAILURE TO FOLLOW THESE INSTRUCTIONS MAY RESULT IN THE CANCELLATION OF YOUR SURGERY  PATIENT SIGNATURE_________________________________  NURSE SIGNATURE__________________________________  ________________________________________________________________________

## 2024-06-16 ENCOUNTER — Ambulatory Visit (HOSPITAL_COMMUNITY): Payer: Self-pay | Admitting: Anesthesiology

## 2024-06-16 ENCOUNTER — Encounter (HOSPITAL_COMMUNITY): Admission: RE | Disposition: A | Payer: Self-pay | Source: Home / Self Care | Attending: Urology

## 2024-06-16 ENCOUNTER — Observation Stay (HOSPITAL_COMMUNITY)
Admission: RE | Admit: 2024-06-16 | Discharge: 2024-06-17 | Disposition: A | Discharge status: Home / Self Care | Attending: Urology | Admitting: Urology

## 2024-06-16 ENCOUNTER — Other Ambulatory Visit: Payer: Self-pay | Admitting: Surgery

## 2024-06-16 ENCOUNTER — Other Ambulatory Visit: Payer: Self-pay

## 2024-06-16 ENCOUNTER — Observation Stay (HOSPITAL_COMMUNITY)

## 2024-06-16 ENCOUNTER — Ambulatory Visit (HOSPITAL_COMMUNITY): Payer: Self-pay | Admitting: Medical

## 2024-06-16 ENCOUNTER — Encounter (HOSPITAL_COMMUNITY): Payer: Self-pay | Admitting: Urology

## 2024-06-16 DIAGNOSIS — F17210 Nicotine dependence, cigarettes, uncomplicated: Secondary | ICD-10-CM | POA: Diagnosis not present

## 2024-06-16 DIAGNOSIS — C671 Malignant neoplasm of dome of bladder: Principal | ICD-10-CM | POA: Insufficient documentation

## 2024-06-16 DIAGNOSIS — D494 Neoplasm of unspecified behavior of bladder: Secondary | ICD-10-CM | POA: Diagnosis not present

## 2024-06-16 DIAGNOSIS — D090 Carcinoma in situ of bladder: Secondary | ICD-10-CM | POA: Diagnosis not present

## 2024-06-16 DIAGNOSIS — D414 Neoplasm of uncertain behavior of bladder: Secondary | ICD-10-CM | POA: Diagnosis not present

## 2024-06-16 DIAGNOSIS — C679 Malignant neoplasm of bladder, unspecified: Principal | ICD-10-CM | POA: Diagnosis present

## 2024-06-16 DIAGNOSIS — Z79899 Other long term (current) drug therapy: Secondary | ICD-10-CM | POA: Diagnosis not present

## 2024-06-16 DIAGNOSIS — I10 Essential (primary) hypertension: Secondary | ICD-10-CM | POA: Insufficient documentation

## 2024-06-16 DIAGNOSIS — Z7982 Long term (current) use of aspirin: Secondary | ICD-10-CM | POA: Diagnosis not present

## 2024-06-16 DIAGNOSIS — I2510 Atherosclerotic heart disease of native coronary artery without angina pectoris: Secondary | ICD-10-CM | POA: Diagnosis not present

## 2024-06-16 DIAGNOSIS — C673 Malignant neoplasm of anterior wall of bladder: Principal | ICD-10-CM

## 2024-06-16 DIAGNOSIS — F17200 Nicotine dependence, unspecified, uncomplicated: Secondary | ICD-10-CM | POA: Diagnosis not present

## 2024-06-16 DIAGNOSIS — C678 Malignant neoplasm of overlapping sites of bladder: Secondary | ICD-10-CM | POA: Diagnosis not present

## 2024-06-16 SURGERY — TURBT, WITH CHEMOTHERAPEUTIC AGENT INSTILLATION INTO BLADDER
Anesthesia: General

## 2024-06-16 MED ORDER — OXYCODONE HCL 5 MG/5ML PO SOLN: 5.0000 mg | Freq: Once | ORAL | Status: DC | PRN

## 2024-06-16 MED ORDER — LOSARTAN POTASSIUM 25 MG PO TABS
25.0000 mg | ORAL_TABLET | Freq: Every day | ORAL | Status: DC
  Administered 2024-06-17: 25 mg via ORAL
  Filled 2024-06-16: qty 1

## 2024-06-16 MED ORDER — ALBUTEROL SULFATE HFA 108 (90 BASE) MCG/ACT IN AERS
1.0000 | INHALATION_SPRAY | Freq: Four times a day (QID) | RESPIRATORY_TRACT | Status: DC | PRN
Start: 2024-06-16 — End: 2024-06-16

## 2024-06-16 MED ORDER — SODIUM CHLORIDE 0.45 % IV SOLN: INTRAVENOUS | Status: DC

## 2024-06-16 MED ORDER — DEXAMETHASONE SODIUM PHOSPHATE 10 MG/ML IJ SOLN
INTRAMUSCULAR | Status: AC
Start: 2024-06-16 — End: 2024-06-16
  Filled 2024-06-16: qty 1

## 2024-06-16 MED ORDER — PHENYLEPHRINE 80 MCG/ML (10ML) SYRINGE FOR IV PUSH (FOR BLOOD PRESSURE SUPPORT)
PREFILLED_SYRINGE | INTRAVENOUS | Status: AC
  Filled 2024-06-16: qty 10

## 2024-06-16 MED ORDER — ALBUTEROL SULFATE (2.5 MG/3ML) 0.083% IN NEBU: 2.5000 mg | INHALATION_SOLUTION | Freq: Four times a day (QID) | RESPIRATORY_TRACT | Status: DC | PRN

## 2024-06-16 MED ORDER — LIDOCAINE HCL (PF) 2 % IJ SOLN
INTRAMUSCULAR | Status: AC
  Filled 2024-06-16: qty 5

## 2024-06-16 MED ORDER — CEFAZOLIN SODIUM-DEXTROSE 2-4 GM/100ML-% IV SOLN: 2.0000 g | INTRAVENOUS | Status: DC

## 2024-06-16 MED ORDER — FENTANYL CITRATE (PF) 100 MCG/2ML IJ SOLN
INTRAMUSCULAR | Status: AC
  Filled 2024-06-16: qty 2

## 2024-06-16 MED ORDER — ALBUMIN HUMAN 5 % IV SOLN
INTRAVENOUS | Status: DC | PRN
Start: 2024-06-16 — End: 2024-06-16

## 2024-06-16 MED ORDER — PHENYLEPHRINE 80 MCG/ML (10ML) SYRINGE FOR IV PUSH (FOR BLOOD PRESSURE SUPPORT)
PREFILLED_SYRINGE | INTRAVENOUS | Status: DC | PRN
  Administered 2024-06-16: 80 ug via INTRAVENOUS

## 2024-06-16 MED ORDER — TRAMADOL HCL 50 MG PO TABS: 50.0000 mg | ORAL_TABLET | Freq: Four times a day (QID) | ORAL | Status: DC | PRN

## 2024-06-16 MED ORDER — GEMCITABINE CHEMO FOR BLADDER INSTILLATION 2000 MG
2000.0000 mg | Freq: Once | INTRAVENOUS | Status: AC
  Administered 2024-06-16: 2000 mg via INTRAVESICAL
  Filled 2024-06-16: qty 2000

## 2024-06-16 MED ORDER — LACTATED RINGERS IV SOLN: INTRAVENOUS | Status: DC | PRN

## 2024-06-16 MED ORDER — SUCCINYLCHOLINE CHLORIDE 200 MG/10ML IV SOSY
PREFILLED_SYRINGE | INTRAVENOUS | Status: DC | PRN
  Administered 2024-06-16: 20 mg via INTRAVENOUS

## 2024-06-16 MED ORDER — CHLORHEXIDINE GLUCONATE CLOTH 2 % EX PADS: 6.0000 | MEDICATED_PAD | Freq: Once | CUTANEOUS | Status: DC

## 2024-06-16 MED ORDER — OXYCODONE HCL 5 MG PO TABS: 5.0000 mg | ORAL_TABLET | Freq: Once | ORAL | Status: DC | PRN

## 2024-06-16 MED ORDER — ALBUMIN HUMAN 5 % IV SOLN
INTRAVENOUS | Status: AC
  Filled 2024-06-16: qty 250

## 2024-06-16 MED ORDER — DEXAMETHASONE SODIUM PHOSPHATE 10 MG/ML IJ SOLN
INTRAMUSCULAR | Status: DC | PRN
  Administered 2024-06-16: 10 mg via INTRAVENOUS

## 2024-06-16 MED ORDER — FENTANYL CITRATE PF 50 MCG/ML IJ SOSY
PREFILLED_SYRINGE | INTRAMUSCULAR | Status: AC
  Filled 2024-06-16: qty 2

## 2024-06-16 MED ORDER — FENTANYL CITRATE (PF) 100 MCG/2ML IJ SOLN
INTRAMUSCULAR | Status: DC | PRN
  Administered 2024-06-16: 100 ug via INTRAVENOUS

## 2024-06-16 MED ORDER — CHLORHEXIDINE GLUCONATE 0.12 % MT SOLN
15.0000 mL | Freq: Once | OROMUCOSAL | Status: AC
  Administered 2024-06-16: 15 mL via OROMUCOSAL

## 2024-06-16 MED ORDER — ONDANSETRON HCL 4 MG/2ML IJ SOLN
INTRAMUSCULAR | Status: AC
  Filled 2024-06-16: qty 2

## 2024-06-16 MED ORDER — HYOSCYAMINE SULFATE 0.5 MG/ML IJ SOLN
0.2500 mg | INTRAMUSCULAR | Status: DC | PRN
  Filled 2024-06-16: qty 0.5

## 2024-06-16 MED ORDER — BUPROPION HCL ER (SR) 150 MG PO TB12
150.0000 mg | ORAL_TABLET | Freq: Two times a day (BID) | ORAL | Status: DC
  Administered 2024-06-16 – 2024-06-17 (×2): 150 mg via ORAL
  Filled 2024-06-16 (×2): qty 1

## 2024-06-16 MED ORDER — CEFAZOLIN SODIUM-DEXTROSE 2-4 GM/100ML-% IV SOLN
2.0000 g | INTRAVENOUS | Status: AC
  Administered 2024-06-16: 2 g via INTRAVENOUS
  Filled 2024-06-16: qty 100

## 2024-06-16 MED ORDER — TAMSULOSIN HCL 0.4 MG PO CAPS
0.4000 mg | ORAL_CAPSULE | Freq: Two times a day (BID) | ORAL | Status: DC
  Administered 2024-06-16 – 2024-06-17 (×2): 0.4 mg via ORAL
  Filled 2024-06-16 (×2): qty 1

## 2024-06-16 MED ORDER — ONDANSETRON HCL 4 MG/2ML IJ SOLN: 4.0000 mg | Freq: Once | INTRAMUSCULAR | Status: DC | PRN

## 2024-06-16 MED ORDER — ACETAMINOPHEN 500 MG PO TABS
1000.0000 mg | ORAL_TABLET | Freq: Once | ORAL | Status: AC
  Administered 2024-06-16: 1000 mg via ORAL
  Filled 2024-06-16: qty 2

## 2024-06-16 MED ORDER — ACETAMINOPHEN 500 MG PO TABS: 1000.0000 mg | ORAL_TABLET | ORAL | Status: DC

## 2024-06-16 MED ORDER — SODIUM CHLORIDE 0.9 % IR SOLN
Status: DC | PRN
  Administered 2024-06-16: 3000 mL

## 2024-06-16 MED ORDER — LACTATED RINGERS IV SOLN: INTRAVENOUS | Status: DC

## 2024-06-16 MED ORDER — ONDANSETRON HCL 4 MG/2ML IJ SOLN
INTRAMUSCULAR | Status: DC | PRN
  Administered 2024-06-16: 4 mg via INTRAVENOUS

## 2024-06-16 MED ORDER — PROPOFOL 10 MG/ML IV BOLUS
INTRAVENOUS | Status: DC | PRN
  Administered 2024-06-16: 100 mg via INTRAVENOUS

## 2024-06-16 MED ORDER — LIDOCAINE HCL (PF) 2 % IJ SOLN
INTRAMUSCULAR | Status: DC | PRN
  Administered 2024-06-16: 100 mg via INTRADERMAL

## 2024-06-16 MED ORDER — ACETAMINOPHEN 10 MG/ML IV SOLN
1000.0000 mg | Freq: Four times a day (QID) | INTRAVENOUS | Status: DC
  Administered 2024-06-17 (×2): 1000 mg via INTRAVENOUS
  Filled 2024-06-16 (×3): qty 100

## 2024-06-16 MED ORDER — PROPOFOL 10 MG/ML IV BOLUS
INTRAVENOUS | Status: AC
  Filled 2024-06-16: qty 20

## 2024-06-16 MED ORDER — ORAL CARE MOUTH RINSE: 15.0000 mL | Freq: Once | OROMUCOSAL | Status: AC

## 2024-06-16 MED ORDER — METOPROLOL SUCCINATE ER 50 MG PO TB24
50.0000 mg | ORAL_TABLET | Freq: Every day | ORAL | Status: DC
  Administered 2024-06-16: 50 mg via ORAL
  Filled 2024-06-16: qty 1

## 2024-06-16 MED ORDER — ENSURE PRE-SURGERY PO LIQD: 296.0000 mL | Freq: Once | ORAL | Status: DC

## 2024-06-16 MED ORDER — ONDANSETRON HCL 4 MG PO TABS: 4.0000 mg | ORAL_TABLET | Freq: Four times a day (QID) | ORAL | Status: DC | PRN

## 2024-06-16 MED ORDER — MEPERIDINE HCL 100 MG/ML IJ SOLN: 6.2500 mg | INTRAMUSCULAR | Status: DC | PRN

## 2024-06-16 MED ORDER — FENTANYL CITRATE PF 50 MCG/ML IJ SOSY
25.0000 ug | PREFILLED_SYRINGE | INTRAMUSCULAR | Status: DC | PRN
  Administered 2024-06-16 (×3): 50 ug via INTRAVENOUS
  Filled 2024-06-16: qty 1

## 2024-06-16 SURGICAL SUPPLY — 13 items
BAG URO CATCHER STRL LF (MISCELLANEOUS) ×1 IMPLANT
DRAPE FOOT SWITCH (DRAPES) ×1 IMPLANT
ELECT REM PT RETURN 15FT ADLT (MISCELLANEOUS) ×1 IMPLANT
GLOVE SURG LX STRL 7.5 STRW (GLOVE) ×1 IMPLANT
GOWN STRL REUS W/ TWL XL LVL3 (GOWN DISPOSABLE) ×1 IMPLANT
KIT TURNOVER KIT A (KITS) ×1 IMPLANT
LOOP CUT BIPOLAR 24F LRG (ELECTROSURGICAL) IMPLANT
MANIFOLD NEPTUNE II (INSTRUMENTS) ×1 IMPLANT
NDL SAFETY ECLIPSE 18X1.5 (NEEDLE) ×1 IMPLANT
PACK CYSTO (CUSTOM PROCEDURE TRAY) ×1 IMPLANT
SYRINGE TOOMEY IRRIG 70ML (MISCELLANEOUS) IMPLANT
TUBING CONNECTING 10 (TUBING) ×1 IMPLANT
TUBING UROLOGY SET (TUBING) ×1 IMPLANT

## 2024-06-16 NOTE — Anesthesia Postprocedure Evaluation (Signed)
 Anesthesia Post Note  Patient: Samuel Lawson  Procedure(s) Performed: TURBT, WITH CHEMOTHERAPEUTIC AGENT INSTILLATION INTO BLADDER     Patient location during evaluation: PACU Anesthesia Type: General Level of consciousness: awake and alert Pain management: pain level controlled Vital Signs Assessment: post-procedure vital signs reviewed and stable Respiratory status: spontaneous breathing, nonlabored ventilation, respiratory function stable and patient connected to nasal cannula oxygen Cardiovascular status: blood pressure returned to baseline and stable Postop Assessment: no apparent nausea or vomiting Anesthetic complications: no   No notable events documented.  Last Vitals:  Vitals:   06/16/24 1900 06/16/24 1915  BP: 114/64 116/66  Pulse: 62 65  Resp: 18 20  Temp:    SpO2: 97% 96%    Last Pain:  Vitals:   06/16/24 1845  TempSrc:   PainSc: Asleep                 Gunnar Hereford

## 2024-06-16 NOTE — Transfer of Care (Signed)
 Immediate Anesthesia Transfer of Care Note  Patient: Samuel Lawson  Procedure(s) Performed: TURBT, WITH CHEMOTHERAPEUTIC AGENT INSTILLATION INTO BLADDER  Patient Location: PACU  Anesthesia Type:General  Level of Consciousness: awake, alert , and oriented  Airway & Oxygen Therapy: Patient Spontanous Breathing and Patient connected to face mask oxygen  Post-op Assessment: Report given to RN and Post -op Vital signs reviewed and unstable, Anesthesiologist notified  Post vital signs: Reviewed and stable  Last Vitals:  Vitals Value Taken Time  BP 98/52 06/16/24 17:45  Temp    Pulse 59 06/16/24 17:48  Resp 18 06/16/24 17:48  SpO2 99 % 06/16/24 17:48  Vitals shown include unfiled device data.  Last Pain:  Vitals:   06/16/24 1600  TempSrc:   PainSc: Asleep      Patients Stated Pain Goal: 5 (06/16/24 1355)  Complications: No notable events documented.

## 2024-06-16 NOTE — H&P (Signed)
 Patient presents today for cystoscopy. He had a suspicious area noted on his CT scan for surveillance of his kidney cancer. He has not had any real significant voiding symptoms or hematuria.   The patient is scheduled for hernia repair the end of October by Dr. Vernetta.     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, left - 05/03/2024, - 03/23/2024, - 03/17/2023, - 2024, - 2023, - 2023 Left renal neoplasm - 03/23/2024, - 03/24/2023, - 2024, - 2023, - 2023, - 2023 Weak Urinary Stream - 03/24/2023, - 2023 Incomplete bladder emptying - 2023 Urinary Hesitancy - 2023      PMH Notes: Acute MI (2009), collagen vascular disease (HCC), prediabetes   NON-GU PMH: Ventral hernia without obstruction or gangrene - 2023 Hypercholesterolemia Hypertension    FAMILY HISTORY: 2 daughters - Daughter Alzheimer's Disease - Father Hypertension - Father stroke - Father   SOCIAL HISTORY: Marital Status: Divorced Preferred Language: English; Ethnicity: Not Hispanic Or Latino Current Smoking Status: Patient smokes. Has smoked since 09/10/1991. Smokes 1 pack per day.   Tobacco Use Assessment Completed: Used Tobacco in last 30 days? Has never drank.  Does not drink caffeine. Patient's occupation is/was Retired.     Notes: Patient drinks green tea   REVIEW OF SYSTEMS:    GU Review Male:   Patient denies frequent urination, hard to postpone urination,  burning/ pain with urination, get up at night to urinate, leakage of urine, stream starts and stops, trouble starting your stream, have to strain to urinate , erection problems, and penile pain.  Gastrointestinal (Upper):   Patient denies nausea, vomiting, and indigestion/ heartburn.  Gastrointestinal (Lower):   Patient denies diarrhea and constipation.  Constitutional:   Patient denies fever, night sweats, weight loss, and fatigue.  Skin:   Patient denies skin rash/ lesion and itching.  Eyes:   Patient denies blurred vision and double vision.  Ears/ Nose/ Throat:   Patient denies sore throat and sinus problems.  Hematologic/Lymphatic:   Patient denies swollen glands and easy bruising.  Cardiovascular:   Patient denies leg swelling and chest pains.  Respiratory:   Patient denies cough and shortness of breath.  Endocrine:   Patient denies excessive thirst.  Musculoskeletal:   Patient denies back pain and joint pain.  Neurological:   Patient denies headaches and dizziness.  Psychologic:   Patient denies depression and anxiety.   VITAL SIGNS: None   MULTI-SYSTEM PHYSICAL EXAMINATION:    Constitutional: Well-nourished. No physical deformities. Normally developed. Good grooming.  Respiratory: Normal breath sounds. No labored breathing, no use of accessory muscles.   Cardiovascular: Regular rate and rhythm. No murmur, no gallop. Normal temperature, normal extremity pulses, no swelling, no varicosities.      Complexity of Data:  Source Of History:  Patient  Records Review:   Pathology Reports, Previous Doctor Records, Previous Patient Records, POC Tool  Urine Test Review:   Urinalysis  Urodynamics Review:   Review  Bladder Scan   PROCEDURES:         Flexible Cystoscopy - 52000  Risks, benefits, and some of the potential complications of the procedure were discussed at length with the patient including infection, bleeding, voiding discomfort, urinary retention, fever, chills, sepsis, and  others. All questions were answered. Informed consent was obtained. Sterile technique and intraurethral analgesia were used.  Meatus:  Normal size. Normal location. Normal condition.  Urethra:  No strictures.  External Sphincter:  Normal.  Verumontanum:  Normal.  Prostate:  Non-obstructing. No hyperplasia.  Bladder Neck:  Non-obstructing.  Ureteral Orifices:  Normal location. Normal size. Normal shape. Effluxed clear urine.  Bladder:  A dome tumor. 2 cm tumor. No trabeculation. Normal mucosa. No stones.      The lower urinary tract was carefully examined. The procedure was well-tolerated and without complications. Antibiotic instructions were given. Instructions were given to call the office immediately for bloody urine, difficulty urinating, urinary retention, painful or frequent urination, fever, chills, nausea, vomiting or other illness. The patient stated that he understood these instructions and would comply with them.         Visit Complexity - G2211          Urinalysis Dipstick Dipstick Cont'd  Color: Yellow Bilirubin: Neg mg/dL  Appearance: Clear Ketones: Neg mg/dL  Specific Gravity: 8.979 Blood: Neg ery/uL  pH: 5.5 Protein: Neg mg/dL  Glucose: Neg mg/dL Urobilinogen: 0.2 mg/dL    Nitrites: Neg    Leukocyte Esterase: Neg leu/uL    ASSESSMENT:      ICD-10 Details  1 GU:   Bladder tumor/neoplasm - D41.4    PLAN:           Document Letter(s):  Created for Patient: Clinical Summary         Notes:   The patient has about a 2 cm tumor at the bladder dome that appears to be superficial noninvasive. We discussed management strategies. I recommended TURBT. I also discussed the possibility of postop gemcitabine instillation. We went through all this in detail and I discussed grab the surgery form. We also discussed the expected postoperative course. Having gone through it all, he like to get this done before his hernia operation which is scheduled for the end of October. Will try  to get this done for him quickly.

## 2024-06-16 NOTE — Anesthesia Procedure Notes (Signed)
 Procedure Name: LMA Insertion Date/Time: 06/16/2024 5:05 PM  Performed by: Obadiah Reyes BROCKS, CRNAPre-anesthesia Checklist: Patient identified, Emergency Drugs available, Suction available and Patient being monitored Patient Re-evaluated:Patient Re-evaluated prior to induction Oxygen Delivery Method: Circle System Utilized Preoxygenation: Pre-oxygenation with 100% oxygen Induction Type: IV induction Ventilation: Mask ventilation without difficulty LMA: LMA inserted LMA Size: 5.0 Number of attempts: 1 Airway Equipment and Method: Bite block Placement Confirmation: positive ETCO2 Tube secured with: Tape Dental Injury: Teeth and Oropharynx as per pre-operative assessment

## 2024-06-16 NOTE — Op Note (Signed)
 Preoperative diagnosis:  Bladder cancer, bladder dome, 1.5 cm  Postoperative diagnosis:  Same  Procedure: Cystoscopy, bilateral retrograde pyelogram with interpretation Transurethral resection of bladder tumor, 1.5 cm Postoperative instillation of intravesical gemcitabine  Surgeon: Morene MICAEL Salines, MD  Anesthesia: General  Complications: None  Intraoperative findings:  #1: The right retrograde pyelogram was performed with 10 cc Omnipaque  contrast demonstrated normal caliber ureter with no filling defects or abnormalities.  There were sharp calyces with no hydroureteronephrosis. #2: Left retrograde pyelogram was performed with 10 cc of Omnipaque  contrast and demonstrated normal caliber ureter with no filling defects or other abnormalities.  The calyces were sharp and there was no hydroureteronephrosis. #3: The tumor was located in the anterior bladder wall.  It was superficial and appeared low-grade in nature.  It was 1.5 cm and completely resected. #3: The patient had a high riding median lobe with moderate intravesical protrusion.  EBL: Minimal  Specimens: Bladder tumor anterior bladder wall  Indication: Samuel Lawson is a 78 y.o. patient with bladder tumor that was found on CT scan which was performed for abdominal pain.  After reviewing the management options for treatment, he elected to proceed with the above surgical procedure(s). We have discussed the potential benefits and risks of the procedure, side effects of the proposed treatment, the likelihood of the patient achieving the goals of the procedure, and any potential problems that might occur during the procedure or recuperation. Informed consent has been obtained.  Description of procedure:  Consent was obtained the preoperative holding area.  He was brought back to the operating room placed on the table in supine position.  General esthesia was then induced endotracheal tube was inserted.  He was placed in  dorsolithotomy position and prepped and draped sterilely cystoscopy fashion.  A timeout was subsequently performed.  21 French degree cystoscope was gently passed to the patient's urethra and into bladder under visual guidance.  Cystoscopy was performed with the above findings.  Then used a 5 [place] open-ended ureteral catheter and injected Omnipaque  contrast through the catheter and performed retrograde pyelograms under fluoroscopic guidance.  The above findings were noted.  Subsequently exchanged a 21 French sheath for the 26 French resectoscope sheath using the visual obturator.  I then exchanged the operator for the loop element.  I then gently resected the tumor at the anterior bladder wall.  I resected the base.  I fulgurated the surrounding area.  Hemostasis was noted to be excellent.  I evacuated the patient's bladder tumor and then emptied the bladder.  I subsequently removed the scope.  The patient was subsequently extubated returned to the PACU in stable condition.  In the PACU, ~11mL's with 2gm of Gemcitabine was instilled into the patient's bladder through an 32 French Foley catheter.  This was allowed to dwell for 60-90 minutes prior to emptying the Foley catheter and removing it.  Disposition: The patient is being admitted for observation since he lives alone.

## 2024-06-16 NOTE — Interval H&P Note (Signed)
 History and Physical Interval Note:  06/16/2024 4:16 PM  Samuel Lawson  has presented today for surgery, with the diagnosis of BLADDER CANCER.  The various methods of treatment have been discussed with the patient and family. After consideration of risks, benefits and other options for treatment, the patient has consented to  Procedure(s) with comments: TURBT, WITH CHEMOTHERAPEUTIC AGENT INSTILLATION INTO BLADDER (N/A) - TRANSURETHRAL RESECTION OF BLADDER TUMOR WITH INTRAVESICAL GEMCITABINE AND BILATERAL RETROGRADE PYELOGRAM as a surgical intervention.  The patient's history has been reviewed, patient examined, no change in status, stable for surgery.  I have reviewed the patient's chart and labs.  Questions were answered to the patient's satisfaction.     Morene LELON Salines

## 2024-06-16 NOTE — Anesthesia Preprocedure Evaluation (Signed)
 Anesthesia Evaluation  Patient identified by MRN, date of birth, ID band Patient awake    Reviewed: Allergy & Precautions, NPO status , Patient's Chart, lab work & pertinent test results, reviewed documented beta blocker date and time   History of Anesthesia Complications Negative for: history of anesthetic complications  Airway Mallampati: II  TM Distance: >3 FB Neck ROM: Full    Dental  (+) Dental Advisory Given, Missing, Caps,    Pulmonary Current Smoker and Patient abstained from smoking.   breath sounds clear to auscultation       Cardiovascular hypertension, Pt. on medications and Pt. 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 Wall thickness was increased in a pattern of moderate LVH. Systolic function was normal. The estimated ejection fraction was in the range of 60% to 65%. Wall motion was normal; there were no regional wall motion abnormalities. Left ventricular diastolic function parameters were normal.  - Left atrium: The atrium was normal in size.  - Inferior vena cava: The vessel was normal in size. The  respirophasic diameter changes were in the normal range (>= 50%), consistent with normal central venous pressure.  - Pericardium, extracardiac: There was no pericardial effusion.   Stent x 2 2001   Neuro/Psych negative neurological ROS  negative psych ROS   GI/Hepatic Neg liver ROS,GERD  Medicated and Controlled,,  Endo/Other  Hyperlipidemia Obesity  Renal/GU Renal diseaseLeft Renal mass s/p resection   BPH    Musculoskeletal negative musculoskeletal ROS (+)    Abdominal  (+) + obese  Peds  Hematology negative hematology ROS (+)   Anesthesia Other Findings   Reproductive/Obstetrics                               Anesthesia Physical Anesthesia Plan  ASA: 3  Anesthesia Plan: General   Post-op Pain Management: Tylenol  PO (pre-op )*   Induction: Intravenous  PONV Risk Score and Plan: 1 and Ondansetron  and Dexamethasone   Airway Management Planned: LMA  Additional Equipment: None  Intra-op Plan:   Post-operative Plan: Extubation in OR  Informed Consent: I have reviewed the patients History and Physical, chart, labs and discussed the procedure including the risks, benefits and alternatives for the proposed anesthesia with the patient or authorized representative who has indicated his/her understanding and acceptance.     Dental advisory given  Plan Discussed with: CRNA and Anesthesiologist  Anesthesia Plan Comments:          Anesthesia Quick Evaluation

## 2024-06-17 ENCOUNTER — Other Ambulatory Visit: Payer: Self-pay

## 2024-06-17 ENCOUNTER — Other Ambulatory Visit (HOSPITAL_COMMUNITY): Payer: Self-pay

## 2024-06-17 DIAGNOSIS — C671 Malignant neoplasm of dome of bladder: Secondary | ICD-10-CM | POA: Diagnosis not present

## 2024-06-17 DIAGNOSIS — Z79899 Other long term (current) drug therapy: Secondary | ICD-10-CM | POA: Diagnosis not present

## 2024-06-17 MED ORDER — PHENAZOPYRIDINE HCL 200 MG PO TABS
200.0000 mg | ORAL_TABLET | Freq: Three times a day (TID) | ORAL | 0 refills | Status: AC | PRN
  Filled 2024-06-17: qty 10, 4d supply, fill #0

## 2024-06-17 NOTE — Plan of Care (Signed)
  Problem: Education: Goal: Knowledge of General Education information will improve Description: Including pain rating scale, medication(s)/side effects and non-pharmacologic comfort measures Outcome: Progressing   Problem: Health Behavior/Discharge Planning: Goal: Ability to manage health-related needs will improve Outcome: Progressing   Problem: Clinical Measurements: Goal: Cardiovascular complication will be avoided Outcome: Progressing   Problem: Nutrition: Goal: Adequate nutrition will be maintained Outcome: Progressing   Problem: Safety: Goal: Ability to remain free from injury will improve Outcome: Progressing

## 2024-06-17 NOTE — Discharge Summary (Signed)
 Date of admission: 06/16/2024  Date of discharge: 06/17/2024  Admission diagnosis: bladder cancer  Discharge diagnosis: same - anterior bladder wall,  lives alone  Secondary diagnoses:  Patient Active Problem List   Diagnosis Date Noted   Bladder cancer (HCC) 06/16/2024   Heart block 04/05/2024   Bradycardia 04/04/2024   Hypercholesteremia 03/31/2024   Symptomatic bradycardia 03/30/2024   Heart block AV complete (HCC) 03/30/2024   Near syncope 03/30/2024   Anxiety 05/05/2022   Prediabetes 05/05/2022   Collagen vascular disease 05/05/2022   Incisional hernia 05/02/2022   Memory loss 01/02/2022   Renal cell cancer, left, 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 12/05/2021   Upper respiratory tract infection 10/31/2021   Thrombocytopenia 01/07/2020   Eustachian tube dysfunction, bilateral 11/17/2019   Chronic swimmer's ear of both sides 03/19/2018   Impacted cerumen of both ears 03/19/2018   Sensorineural hearing loss (SNHL), bilateral 06/26/2017   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   Left medial knee pain 01/22/2017   Left arm pain 11/19/2016   Dyshidrotic hand dermatitis 01/22/2016   BPH (benign prostatic hyperplasia) 07/19/2015   Nicotine  dependence 06/22/2015   Orthostatic hypotension 12/14/2014   Dizziness 11/29/2014   Gastroesophageal reflux disease without esophagitis 03/09/2014   Essential hypertension 03/09/2014   Coronary artery disease involving native coronary artery of native heart without angina pectoris 08/25/2013   Hyperlipidemia LDL goal <100 08/25/2013   Obesity (BMI 30-39.9) 08/25/2013   Disequilibrium 08/25/2013    Procedures performed: Procedure(s): TURBT, WITH CHEMOTHERAPEUTIC AGENT INSTILLATION INTO BLADDER  History and Physical: For full details, please see admission history and physical. Briefly, Samuel Lawson is a 78 y.o. year old patient with small bladder tumor.  He lives alone and has no one to monitor him after  surgery.   Hospital Course: Patient tolerated the procedure well.  He was then transferred to the floor after an uneventful PACU stay.  His hospital course was uncomplicated.  On POD#1 he had met discharge criteria: was eating a regular diet, was up and ambulating independently,  pain was well controlled, was voiding without a catheter, and was ready to for discharge.   Laboratory values:  Recent Labs    06/14/24 1115  WBC 8.1  HGB 15.0  HCT 46.6   Recent Labs    06/14/24 1115  NA 138  K 4.4  CL 104  CO2 25  GLUCOSE 106*  BUN 18  CREATININE 1.27*  CALCIUM  9.2   No results for input(s): LABPT, INR in the last 72 hours. No results for input(s): LABURIN in the last 72 hours. Results for orders placed or performed during the hospital encounter of 05/01/24  Resp panel by RT-PCR (RSV, Flu A&B, Covid) Anterior Nasal Swab     Status: None   Collection Time: 05/01/24  5:23 AM   Specimen: Anterior Nasal Swab  Result Value Ref Range Status   SARS Coronavirus 2 by RT PCR NEGATIVE NEGATIVE Final   Influenza A by PCR NEGATIVE NEGATIVE Final   Influenza B by PCR NEGATIVE NEGATIVE Final    Comment: (NOTE) The Xpert Xpress SARS-CoV-2/FLU/RSV plus assay is intended as an aid in the diagnosis of influenza from Nasopharyngeal swab specimens and should not be used as a sole basis for treatment. Nasal washings and aspirates are unacceptable for Xpert Xpress SARS-CoV-2/FLU/RSV testing.  Fact Sheet for Patients: BloggerCourse.com  Fact Sheet for Healthcare Providers: SeriousBroker.it  This test is not yet approved or cleared by the [place]  FDA and has been authorized  for detection and/or diagnosis of SARS-CoV-2 by FDA under an Emergency Use Authorization (EUA). This EUA will remain in effect (meaning this test can be used) for the duration of the COVID-19 declaration under Section 564(b)(1) of the Act, 21 U.S.C. section  360bbb-3(b)(1), unless the authorization is terminated or revoked.     Resp Syncytial Virus by PCR NEGATIVE NEGATIVE Final    Comment: (NOTE) Fact Sheet for Patients: BloggerCourse.com  Fact Sheet for Healthcare Providers: SeriousBroker.it  This test is not yet approved or cleared by the [place]  FDA and has been authorized for detection and/or diagnosis of SARS-CoV-2 by FDA under an Emergency Use Authorization (EUA). This EUA will remain in effect (meaning this test can be used) for the duration of the COVID-19 declaration under Section 564(b)(1) of the Act, 21 U.S.C. section 360bbb-3(b)(1), unless the authorization is terminated or revoked.  Performed at [facility], [address] [place], [place] [address]     Disposition: Home  Discharge instruction: The patient was instructed to be ambulatory but told to refrain from heavy lifting, strenuous activity, or driving.   Discharge medications:  Allergies as of 06/17/2024   No Known Allergies      Medication List     TAKE these medications    Accu-Chek Guide Control Liqd 1 each by In Vitro route once as needed for up to 1 dose. R73.03   Accu-Chek Guide test strip Generic drug: glucose blood Use as instructed. Check blood glucose by fingerstick once per day. R73.03   albuterol  108 (90 Base) MCG/ACT inhaler Commonly known as: VENTOLIN  HFA Inhale 1-2 puffs into the lungs every 6 (six) hours as needed.   aspirin  EC 81 MG tablet Take 81 mg by mouth at bedtime. Swallow whole.   atorvastatin  40 MG tablet Commonly known as: LIPITOR Take 1 tablet (40 mg total) by mouth at bedtime.   buPROPion  150 MG 12 hr tablet Commonly known as: Wellbutrin  SR Take 1 tablet (150 mg total) by mouth 2 (two) times daily.   iVIZIA Dry Eyes 0.5 % Soln Generic drug: Povidone (PF) Place 1-2 drops into both eyes 3 (three) times daily as needed (dry/irriated eyes.).    losartan  25 MG tablet Commonly known as: Cozaar  Take 1 tablet (25 mg total) by mouth daily.   metoprolol  succinate 50 MG 24 hr tablet Commonly known as: Toprol  XL Take 1 tablet (50 mg total) by mouth daily. Take with or immediately following a meal.   ondansetron  4 MG tablet Commonly known as: ZOFRAN  Take 1 tablet (4 mg total) by mouth every 6 (six) hours.   phenazopyridine 200 MG tablet Commonly known as: Pyridium Take 1 tablet (200 mg total) by mouth 3 (three) times daily as needed for pain.   tamsulosin  0.4 MG Caps capsule Commonly known as: FLOMAX  Take 2 capsules (0.8 mg total) by mouth daily. What changed:  how much to take when to take this   Vascepa  1 g capsule Generic drug: icosapent  Ethyl Take 1 capsule (1 g total) by mouth 2 (two) times daily.Must have office visit for refills        Followup:   Follow-up Information     Buck Rodena BIRCH, NP Follow up on 06/30/2024.   Specialty: Urology Why: 11A Contact information: [address] [place] [place] [address] [phone]

## 2024-06-17 NOTE — Progress Notes (Signed)
 Discharge instructions given to patient questions asked and answered.

## 2024-06-17 NOTE — Discharge Instructions (Signed)

## 2024-06-18 LAB — SURGICAL PATHOLOGY

## 2024-06-24 DIAGNOSIS — Z7982 Long term (current) use of aspirin: Secondary | ICD-10-CM | POA: Diagnosis not present

## 2024-06-24 DIAGNOSIS — I129 Hypertensive chronic kidney disease with stage 1 through stage 4 chronic kidney disease, or unspecified chronic kidney disease: Secondary | ICD-10-CM | POA: Diagnosis not present

## 2024-06-24 DIAGNOSIS — F324 Major depressive disorder, single episode, in partial remission: Secondary | ICD-10-CM | POA: Diagnosis not present

## 2024-06-24 DIAGNOSIS — Z6835 Body mass index (BMI) 35.0-35.9, adult: Secondary | ICD-10-CM | POA: Diagnosis not present

## 2024-06-24 DIAGNOSIS — Z85528 Personal history of other malignant neoplasm of kidney: Secondary | ICD-10-CM | POA: Diagnosis not present

## 2024-06-24 DIAGNOSIS — Z950 Presence of cardiac pacemaker: Secondary | ICD-10-CM | POA: Diagnosis not present

## 2024-06-24 DIAGNOSIS — R001 Bradycardia, unspecified: Secondary | ICD-10-CM | POA: Diagnosis not present

## 2024-06-24 DIAGNOSIS — Z59869 Financial insecurity, unspecified: Secondary | ICD-10-CM | POA: Diagnosis not present

## 2024-06-24 DIAGNOSIS — E785 Hyperlipidemia, unspecified: Secondary | ICD-10-CM | POA: Diagnosis not present

## 2024-06-24 DIAGNOSIS — I2510 Atherosclerotic heart disease of native coronary artery without angina pectoris: Secondary | ICD-10-CM | POA: Diagnosis not present

## 2024-06-24 DIAGNOSIS — J4489 Other specified chronic obstructive pulmonary disease: Secondary | ICD-10-CM | POA: Diagnosis not present

## 2024-06-24 DIAGNOSIS — F419 Anxiety disorder, unspecified: Secondary | ICD-10-CM | POA: Diagnosis not present

## 2024-06-24 DIAGNOSIS — N1831 Chronic kidney disease, stage 3a: Secondary | ICD-10-CM | POA: Diagnosis not present

## 2024-06-24 DIAGNOSIS — N400 Enlarged prostate without lower urinary tract symptoms: Secondary | ICD-10-CM | POA: Diagnosis not present

## 2024-06-30 DIAGNOSIS — R350 Frequency of micturition: Secondary | ICD-10-CM | POA: Diagnosis not present

## 2024-06-30 DIAGNOSIS — R39198 Other difficulties with micturition: Secondary | ICD-10-CM | POA: Diagnosis not present

## 2024-06-30 DIAGNOSIS — C673 Malignant neoplasm of anterior wall of bladder: Secondary | ICD-10-CM | POA: Diagnosis not present

## 2024-07-01 ENCOUNTER — Other Ambulatory Visit: Payer: Self-pay

## 2024-07-05 ENCOUNTER — Other Ambulatory Visit: Payer: Self-pay

## 2024-07-07 ENCOUNTER — Other Ambulatory Visit (HOSPITAL_COMMUNITY): Payer: Self-pay

## 2024-07-07 MED ORDER — CIPROFLOXACIN HCL 500 MG PO TABS
500.0000 mg | ORAL_TABLET | Freq: Two times a day (BID) | ORAL | 0 refills | Status: DC
  Filled 2024-07-07: qty 14, 7d supply, fill #0

## 2024-07-09 ENCOUNTER — Other Ambulatory Visit: Payer: Self-pay

## 2024-07-09 ENCOUNTER — Other Ambulatory Visit (HOSPITAL_COMMUNITY): Payer: Self-pay

## 2024-07-16 ENCOUNTER — Other Ambulatory Visit: Payer: Self-pay

## 2024-07-19 ENCOUNTER — Ambulatory Visit: Attending: Cardiology | Admitting: Cardiology

## 2024-07-19 ENCOUNTER — Encounter: Payer: Self-pay | Admitting: Cardiology

## 2024-07-19 VITALS — BP 152/88 | HR 65 | Wt 191.0 lb

## 2024-07-19 DIAGNOSIS — I442 Atrioventricular block, complete: Secondary | ICD-10-CM | POA: Diagnosis not present

## 2024-07-19 DIAGNOSIS — I10 Essential (primary) hypertension: Secondary | ICD-10-CM | POA: Diagnosis not present

## 2024-07-19 DIAGNOSIS — I2510 Atherosclerotic heart disease of native coronary artery without angina pectoris: Secondary | ICD-10-CM

## 2024-07-19 LAB — CUP PACEART INCLINIC DEVICE CHECK
Date Time Interrogation Session: 20251110170252
Implantable Lead Connection Status: 753985
Implantable Lead Connection Status: 753985
Implantable Lead Implant Date: 20250729
Implantable Lead Implant Date: 20250729
Implantable Lead Location: 753859
Implantable Lead Location: 753860
Implantable Lead Model: 3830
Implantable Lead Model: 5076
Implantable Pulse Generator Implant Date: 20250729
Lead Channel Impedance Value: 532 Ohm
Lead Channel Impedance Value: 589 Ohm
Lead Channel Pacing Threshold Amplitude: 0.75 V
Lead Channel Pacing Threshold Amplitude: 1 V
Lead Channel Pacing Threshold Pulse Width: 0.4 ms
Lead Channel Pacing Threshold Pulse Width: 0.4 ms
Lead Channel Sensing Intrinsic Amplitude: 1 mV
Lead Channel Sensing Intrinsic Amplitude: 10 mV

## 2024-07-19 NOTE — Patient Instructions (Addendum)

## 2024-07-19 NOTE — Progress Notes (Signed)
  Electrophysiology Office Note:   Date:  07/19/2024  ID:  Anan Dapolito, DOB [birth year], MRN [id number]  Primary Cardiologist: Gordy Bergamo, MD Primary Heart Failure: None Electrophysiologist: Shamari Trostel Gladis Norton, MD      History of Present Illness:   Samuel Lawson is a 78 y.o. male with h/o coronary artery disease, hypertension, hyperlipidemia, second-degree AV block seen today for routine electrophysiology follow-up s/p Pacemaker implant.  Since last being seen in our clinic the patient reports doing well.  He has no chest pain or shortness of breath.  He is able to do his daily activities.  He feels improved since his pacemaker was implanted.  he denies chest pain, palpitations, dyspnea, PND, orthopnea, nausea, vomiting, dizziness, syncope, edema, weight gain, or early satiety.    Review of systems complete and found to be negative unless listed in HPI.      EP Information / Studies Reviewed:    EKG is ordered today. Personal review as below.  EKG Interpretation Date/Time:  Monday July 19 2024 15:43:20 EST Ventricular Rate:  65 PR Interval:  206 QRS Duration:  122 QT Interval:  408 QTC Calculation: 424 R Axis:   265  Text Interpretation: AV SEQUENTIAL PACEMAKER When compared with ECG of 01-May-2024 04:42, No significant change was found Confirmed by Lockie Bothun ([id number]) on 07/19/2024 3:58:39 PM   PPM Interrogation-  reviewed in detail today,  See PACEART report.  Device History: Medtronic Dual Chamber PPM implanted 04/06/2024 for Second Degree AV block  Risk Assessment/Calculations:            Physical Exam:   VS:  BP (!) 152/88   Pulse 65   Wt 191 lb (86.6 kg)   SpO2 97%   BMI 30.84 kg/m    Wt Readings from Last 3 Encounters:  07/19/24 191 lb (86.6 kg)  06/16/24 190 lb 4.1 oz (86.3 kg)  06/14/24 183 lb 6 oz (83.2 kg)     GEN: Well nourished, well developed in no acute distress NECK: No JVD; No carotid bruits CARDIAC: Regular rate and rhythm, no  murmurs, rubs, gallops RESPIRATORY:  Clear to auscultation without rales, wheezing or rhonchi  ABDOMEN: Soft, non-tender, non-distended EXTREMITIES:  No edema; No deformity   ASSESSMENT AND PLAN:    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  Coronary disease: Post RCA stent.  Plan per primary cardiology.  3.  Hypertension: Elevated today.  Usually well-controlled.  Plan per primary care.  Disposition:   Follow up with EP Team as usual post procedure  Signed, Zarria Towell Gladis Norton, MD

## 2024-07-20 ENCOUNTER — Ambulatory Visit: Payer: Self-pay | Admitting: Cardiology

## 2024-07-21 ENCOUNTER — Other Ambulatory Visit: Payer: Self-pay | Admitting: Nurse Practitioner

## 2024-07-21 ENCOUNTER — Other Ambulatory Visit: Payer: Self-pay

## 2024-07-21 DIAGNOSIS — E785 Hyperlipidemia, unspecified: Secondary | ICD-10-CM

## 2024-07-21 MED ORDER — ATORVASTATIN CALCIUM 40 MG PO TABS
40.0000 mg | ORAL_TABLET | Freq: Every day | ORAL | 0 refills | Status: AC
  Filled 2024-07-21: qty 90, 90d supply, fill #0

## 2024-07-22 ENCOUNTER — Other Ambulatory Visit: Payer: Self-pay

## 2024-07-22 ENCOUNTER — Other Ambulatory Visit: Payer: Self-pay | Admitting: Nurse Practitioner

## 2024-07-22 DIAGNOSIS — F17200 Nicotine dependence, unspecified, uncomplicated: Secondary | ICD-10-CM

## 2024-07-22 MED ORDER — ALBUTEROL SULFATE HFA 108 (90 BASE) MCG/ACT IN AERS
1.0000 | INHALATION_SPRAY | Freq: Four times a day (QID) | RESPIRATORY_TRACT | 1 refills | Status: AC | PRN
  Filled 2024-07-22 – 2024-09-15 (×2): qty 18, 25d supply, fill #0

## 2024-07-26 ENCOUNTER — Other Ambulatory Visit: Payer: Self-pay

## 2024-08-06 ENCOUNTER — Other Ambulatory Visit: Payer: Self-pay

## 2024-08-10 ENCOUNTER — Other Ambulatory Visit: Payer: Self-pay | Admitting: Family Medicine

## 2024-08-10 ENCOUNTER — Other Ambulatory Visit: Payer: Self-pay

## 2024-08-10 DIAGNOSIS — R7303 Prediabetes: Secondary | ICD-10-CM

## 2024-08-10 MED ORDER — ACCU-CHEK GUIDE TEST VI STRP
ORAL_STRIP | 2 refills | Status: AC
  Filled 2024-08-10: qty 50, 50d supply, fill #0

## 2024-08-20 ENCOUNTER — Encounter

## 2024-08-20 ENCOUNTER — Ambulatory Visit

## 2024-08-20 DIAGNOSIS — I442 Atrioventricular block, complete: Secondary | ICD-10-CM

## 2024-08-21 LAB — CUP PACEART REMOTE DEVICE CHECK
Battery Remaining Longevity: 146 mo
Battery Voltage: 3.16 V
Brady Statistic AP VP Percent: 62.27 %
Brady Statistic AP VS Percent: 0.04 %
Brady Statistic AS VP Percent: 37.61 %
Brady Statistic AS VS Percent: 0.09 %
Brady Statistic RA Percent Paced: 62.09 %
Brady Statistic RV Percent Paced: 99.87 %
Date Time Interrogation Session: 20251211193039
Implantable Lead Connection Status: 753985
Implantable Lead Connection Status: 753985
Implantable Lead Implant Date: 20250729
Implantable Lead Implant Date: 20250729
Implantable Lead Location: 753859
Implantable Lead Location: 753860
Implantable Lead Model: 3830
Implantable Lead Model: 5076
Implantable Pulse Generator Implant Date: 20250729
Lead Channel Impedance Value: 323 Ohm
Lead Channel Impedance Value: 418 Ohm
Lead Channel Impedance Value: 475 Ohm
Lead Channel Impedance Value: 551 Ohm
Lead Channel Pacing Threshold Amplitude: 0.5 V
Lead Channel Pacing Threshold Amplitude: 0.75 V
Lead Channel Pacing Threshold Pulse Width: 0.4 ms
Lead Channel Pacing Threshold Pulse Width: 0.4 ms
Lead Channel Sensing Intrinsic Amplitude: 0.875 mV
Lead Channel Sensing Intrinsic Amplitude: 0.875 mV
Lead Channel Sensing Intrinsic Amplitude: 10.75 mV
Lead Channel Sensing Intrinsic Amplitude: 10.75 mV
Lead Channel Setting Pacing Amplitude: 1.5 V
Lead Channel Setting Pacing Amplitude: 2 V
Lead Channel Setting Pacing Pulse Width: 0.4 ms
Lead Channel Setting Sensing Sensitivity: 0.9 mV
Zone Setting Status: 755011

## 2024-08-23 ENCOUNTER — Ambulatory Visit: Payer: Self-pay | Admitting: Cardiology

## 2024-08-23 ENCOUNTER — Other Ambulatory Visit: Payer: Self-pay

## 2024-08-26 NOTE — Progress Notes (Signed)
 Remote PPM Transmission

## 2024-09-08 ENCOUNTER — Other Ambulatory Visit: Payer: Self-pay

## 2024-09-16 ENCOUNTER — Other Ambulatory Visit: Payer: Self-pay

## 2024-09-21 ENCOUNTER — Other Ambulatory Visit: Payer: Self-pay

## 2024-10-04 ENCOUNTER — Other Ambulatory Visit: Payer: Self-pay

## 2024-10-08 NOTE — Progress Notes (Signed)
 Samuel Lawson                                          MRN: [id number]   10/08/2024   The [facility] Quality Team Specialist reviewed this patient medical record for the purposes of chart review for care gap closure. The following were reviewed: chart review for care gap closure-kidney health evaluation for diabetes:eGFR  and uACR.    [facility] Quality Team

## 2024-11-19 ENCOUNTER — Encounter

## 2025-02-18 ENCOUNTER — Encounter

## 2025-05-03 ENCOUNTER — Ambulatory Visit: Payer: Self-pay

## 2025-05-20 ENCOUNTER — Encounter
